# Patient Record
Sex: Male | Born: 1955 | Race: White | Hispanic: No | Marital: Married | Smoking: Never smoker
Health system: Southern US, Community
[De-identification: ages and names within clinical notes are randomized; demographics above are authoritative.]

## PROBLEM LIST (undated history)

## (undated) DIAGNOSIS — C159 Malignant neoplasm of esophagus, unspecified: Secondary | ICD-10-CM

## (undated) DIAGNOSIS — J189 Pneumonia, unspecified organism: Secondary | ICD-10-CM

## (undated) DIAGNOSIS — K219 Gastro-esophageal reflux disease without esophagitis: Secondary | ICD-10-CM

## (undated) DIAGNOSIS — T8859XA Other complications of anesthesia, initial encounter: Secondary | ICD-10-CM

## (undated) DIAGNOSIS — T4145XA Adverse effect of unspecified anesthetic, initial encounter: Secondary | ICD-10-CM

## (undated) DIAGNOSIS — M549 Dorsalgia, unspecified: Secondary | ICD-10-CM

## (undated) DIAGNOSIS — G8929 Other chronic pain: Secondary | ICD-10-CM

## (undated) HISTORY — PX: EYE SURGERY: SHX253

## (undated) HISTORY — PX: APPENDECTOMY: SHX54

## (undated) HISTORY — PX: HAND SURGERY: SHX662

## (undated) HISTORY — DX: Malignant neoplasm of esophagus, unspecified: C15.9

## (undated) HISTORY — PX: KNEE ARTHROSCOPY: SUR90

## (undated) HISTORY — PX: CERVICAL FUSION: SHX112

---

## 2000-06-20 ENCOUNTER — Other Ambulatory Visit: Admission: RE | Admit: 2000-06-20 | Discharge: 2000-06-20 | Payer: Self-pay | Admitting: Orthopedic Surgery

## 2003-04-21 ENCOUNTER — Ambulatory Visit (HOSPITAL_COMMUNITY): Admission: RE | Admit: 2003-04-21 | Discharge: 2003-04-22 | Payer: Self-pay | Admitting: Orthopaedic Surgery

## 2005-02-14 ENCOUNTER — Emergency Department (HOSPITAL_COMMUNITY): Admission: EM | Admit: 2005-02-14 | Discharge: 2005-02-14 | Payer: Self-pay | Admitting: Emergency Medicine

## 2006-02-26 ENCOUNTER — Encounter: Admission: RE | Admit: 2006-02-26 | Discharge: 2006-02-26 | Payer: Self-pay | Admitting: Family Medicine

## 2014-03-29 DIAGNOSIS — C159 Malignant neoplasm of esophagus, unspecified: Secondary | ICD-10-CM

## 2014-03-29 HISTORY — DX: Malignant neoplasm of esophagus, unspecified: C15.9

## 2014-03-30 ENCOUNTER — Telehealth: Payer: Self-pay | Admitting: *Deleted

## 2014-03-30 NOTE — Telephone Encounter (Signed)
Received referral from Dr. Liliane Channel office: obstructive esophageal cancer. Called pt, he reports he is tolerating supplements. Having some "regurgitation but most of it stays down." Instructed him to call office sooner if he is unable to keep liquids down. He voiced understanding.

## 2014-03-31 ENCOUNTER — Telehealth: Payer: Self-pay | Admitting: Oncology

## 2014-03-31 NOTE — Telephone Encounter (Signed)
LEFT MESSAGE FOR PATIENT TO RETURN CALL TO SCHEDULE NP APPT.  °

## 2014-03-31 NOTE — Telephone Encounter (Signed)
PATIENT CALLED TO CONFIRM NP APPT ON 12/07 W/DR.SHERRILL

## 2014-04-01 ENCOUNTER — Encounter: Payer: Self-pay | Admitting: Radiation Oncology

## 2014-04-01 NOTE — Progress Notes (Signed)
GI Location of Tumor / Histology: Obstructive esophageal cancer   Billy Romero presented months ago with symptoms [id number] solid dysphasia since June 2015-  Biopsies of  Esophageal brushings revealed: Endoscopy 03/29/14     Positive for malignancy,favor squamous cell carcinoma, Dr. Earlean Shawl , [place],MD 03/29/14 :  Past/Anticipated interventions by surgeon, if any:   Past/Anticipated interventions by medical oncology, if any: appt with Dr. Benay Spice 04/04/14; referral from Dr. Benay Spice to Dr. Servando Snare , scheduled Pet scan, chemotherapy teaching,    Weight changes, if any: 20 lb weight loss  Bowel/Bladder complaints, if any:   Nausea / Vomiting, if any: nausea/vomiting all liquids ,progresive solid/liquid dysphasia, chronic regurgitation of foam, odynophagia  Pain issues, if any:  Chronic back pain  SAFETY ISSUES:  Prior radiation? No  Pacemaker/ICD? NO  Is the patient on methotrexate? NO  Current Complaints / other details:   Married, Hx Ulcerative colitis, sigmoidoscopy 1979, ,peptic ulcer disease,s/p H. Pylori therapy, colon adenoma, [id number]; EGD 1984, , Cervical spine disc surgery,appendectomy Chews tobaco, quit alcohol 2009, hx alcohol abuse,   Father and paternal grandfather died of "leukemia" both age 51,, non smoker, hx alcohol abuse,quit 9 years ago,

## 2014-04-04 ENCOUNTER — Encounter: Payer: Self-pay | Admitting: Oncology

## 2014-04-04 ENCOUNTER — Telehealth: Payer: Self-pay | Admitting: Oncology

## 2014-04-04 ENCOUNTER — Ambulatory Visit: Payer: BC Managed Care – PPO

## 2014-04-04 ENCOUNTER — Ambulatory Visit: Payer: BC Managed Care – PPO | Admitting: Nutrition

## 2014-04-04 ENCOUNTER — Ambulatory Visit (HOSPITAL_BASED_OUTPATIENT_CLINIC_OR_DEPARTMENT_OTHER): Payer: BC Managed Care – PPO | Admitting: Oncology

## 2014-04-04 ENCOUNTER — Encounter (INDEPENDENT_AMBULATORY_CARE_PROVIDER_SITE_OTHER): Payer: Self-pay

## 2014-04-04 VITALS — BP 135/86 | HR 65 | Temp 98.1°F | Resp 19 | Ht 71.0 in | Wt 186.3 lb

## 2014-04-04 DIAGNOSIS — R634 Abnormal weight loss: Secondary | ICD-10-CM

## 2014-04-04 DIAGNOSIS — K5190 Ulcerative colitis, unspecified, without complications: Secondary | ICD-10-CM

## 2014-04-04 DIAGNOSIS — C159 Malignant neoplasm of esophagus, unspecified: Secondary | ICD-10-CM

## 2014-04-04 DIAGNOSIS — R1310 Dysphagia, unspecified: Secondary | ICD-10-CM

## 2014-04-04 NOTE — Progress Notes (Signed)
[facility] New Patient Consult   Referring MD: Earlie Raveling  Billy Romero 59 y.o.  04/20/1956    Reason for Referral: Esophagus cancer   HPI: Billy Romero reports a history of progressive solid dysphagia beginning in June 2015. He now has dysphagia with solids and regurgitates at times. He has occasional liquid dysphagia. He has been using nutrition supplements. He saw Dr. Earlean Shawl and was taken to an upper endoscopy 03/29/2014. A malignant-appearing mass was seen at 28 cm, totally obstructing the esophagus at 30 cm. The endoscopic could not pass this area. Brush cytology and multiple biopsies were obtained. The cytology at [facility] from esophageal brushings revealed a malignancy, favoring squamous cell carcinoma.    Past Medical History  Diagnosis Date  . Esophageal cancer 03/29/14    .  Ulcerative colitis   .  History of peptic ulcer disease, status post H. pylori therapy   .  History of a colon adenoma  Past surgical history: 1. Cervical spine disc surgery 2. Appendectomy in 8 grade Medications: Reviewed  Allergies: Not on File  Family history: His father and paternal grandfather died of "leukemia", both at age 51. No other family history of cancer. He has one brother, 1 sister, one half sister, and 2 half-brothers  Social History:   He lives in [place]. He is retired from a cells position with [employer] He does not use cigarettes. He is an alcoholic and has not used alcohol in 9 years. No transfusion history. No risk factor for HIV or hepatitis. He exercises by running. He plays golf.    ROS:   Positives include: Dysphagia, intermittent odynophagia, 20 pound weight loss, frequent regurgitation of "foam ", chronic low back pain  A complete ROS was otherwise negative.  Physical Exam:  Blood pressure 135/86, pulse 65, temperature 98.1 F (36.7 C), resp. rate 19, height 5' 11"  (1.803 m), weight 186 lb 4.8 oz (84.505 kg), SpO2 100  %.  HEENT: Oropharynx without visible mass, neck without mass Lungs: Clear bilaterally Cardiac: Regular rate and rhythm  Abdomen: No hepatosplenomegaly, nontender, no mass GU: Testes without mass  Vascular: No leg edema Lymph nodes: No cervical, supra-clavicular, axillary, or inguinal nodes Neurologic: Alert and oriented, the motor exam appears intact in the upper and lower extremities Skin: Hyperpigmentation in a suntan distribution Musculoskeletal: No spine tenderness     Assessment/Plan:   1. Esophagus cancer, squamous cell carcinoma, obstructing mass noted at 30 cm from the incisors on endoscopy 12/01/201   2. Solid/liquid dysphagia secondary to #1  3.   Weight loss  4.   History of ulcerative colitis  Disposition:   Billy Romero has been diagnosed with squamous cell carcinoma of the esophagus. We will follow-up on the surgical pathology from the esophagus biopsy. I discussed the standard treatment algorithm for esophagus cancer with Billy Romero and his wife. There is no clinical evidence of distant disease. He will undergo a staging PET scan. If he has localized disease I recommend neoadjuvant chemotherapy and radiation. He is scheduled to see Dr. Lisbeth Renshaw on 04/06/2014. We will make a referral to Dr. Servando Snare. I suspect he will be not be able to undergo an endoscopic ultrasound evaluation secondary to the obstructing mass.  Billy Romero met with the [facility] nutritionist today. He is tolerating liquids and nutrition supplements at present. We will consider placement of a feeding tube if he cannot tolerate liquids.  I recommend weekly Taxol/carboplatin and concurrent radiation. I reviewed the potential  toxicities associated with the Taxol/carboplatin chemotherapy regimen including the chance for nausea/vomiting, mucositis, diarrhea, alopecia, an allergic reaction, and hematologic toxicity. We discussed the neuropathy and bone pain associated with Taxol. He agrees to  proceed.  Billy Romero will be scheduled for a staging PET scan, baseline labs, and a chemotherapy teaching class this week. I discussed the case with Dr. Lisbeth Renshaw. The plan is to begin concurrent chemotherapy and radiation during the week of 04/11/2014.  Approximate 50 minutes were spent with the patient today. The majority of the time was used for counseling and coordination of care.  [place], [place] 04/04/2014, 4:01 PM

## 2014-04-04 NOTE — Progress Notes (Signed)
58 year old male diagnosed with obstructive esophageal cancer.  He is a patient of Dr. Benay Spice.  Past medical history includes remote alcoholism, chronic ulcerative colitis, dysphasia, and weight loss.  Medications include Prilosec.  Labs not available.  Height: 5 feet 11 inches. Weight: 186.3 pounds. Usual body weight: 210 pounds. BMI: 26.  Patient reports he ate a [place] dinner last week without difficulty.   Since he has been diagnosed with esophageal cancer, patient has been told to follow a liquid or very soft solid diet.   He now reports intermittent tolerance of both liquids and solids depending on the day. Patient has been trying to consume one to 2 Ensure Plus a day. Patient endorses weight loss.    Nutrition diagnosis: Unintended weight loss related to obstructive esophageal cancer as evidenced by 11% weight loss from usual body weight.  Intervention:  Patient educated to increase oral nutrition supplements to 4 times a day between meals. Provided samples. Recommended patient blenderize or puree solids and add liquids as needed to change consistency of foods. Provided multiple recipes along with fact sheets on increasing calories and protein. Questions were answered and teach back method used. Contact information was given.  Monitoring, evaluation, goals: Patient will tolerate increased oral intake to minimize further weight loss.  Next visit: To be scheduled.  **Disclaimer: This note was dictated with voice recognition software. Similar sounding words can inadvertently be transcribed and this note may contain transcription errors which may not have been corrected upon publication of note.**

## 2014-04-04 NOTE — Progress Notes (Signed)
Checked in new pt with no financial concerns. °

## 2014-04-04 NOTE — Telephone Encounter (Signed)
Pt confirmed labs/ov/chemo edu spk w/MD's nurse confirming updated D/T per MD, gave pt AVS.... KJ, sent msg to add chemo.... Pt wanted to wait to schedule referral till the first of the year, will call 12/08 to schedule referral for surgery......Marland Kitchen

## 2014-04-05 ENCOUNTER — Ambulatory Visit (HOSPITAL_BASED_OUTPATIENT_CLINIC_OR_DEPARTMENT_OTHER): Payer: BC Managed Care – PPO | Admitting: Nurse Practitioner

## 2014-04-05 ENCOUNTER — Telehealth: Payer: Self-pay | Admitting: Oncology

## 2014-04-05 ENCOUNTER — Encounter (HOSPITAL_COMMUNITY): Payer: Self-pay | Admitting: Internal Medicine

## 2014-04-05 ENCOUNTER — Inpatient Hospital Stay (HOSPITAL_COMMUNITY)
Admission: AD | Admit: 2014-04-05 | Discharge: 2014-04-21 | DRG: 374 | Disposition: A | Discharge status: Home Health | Payer: BC Managed Care – PPO | Source: Ambulatory Visit | Attending: Internal Medicine | Admitting: Internal Medicine

## 2014-04-05 ENCOUNTER — Encounter: Payer: Self-pay | Admitting: Internal Medicine

## 2014-04-05 ENCOUNTER — Other Ambulatory Visit: Payer: Self-pay | Admitting: *Deleted

## 2014-04-05 ENCOUNTER — Telehealth: Payer: Self-pay | Admitting: *Deleted

## 2014-04-05 VITALS — BP 129/84 | HR 65 | Temp 97.6°F | Resp 18 | Ht 71.0 in | Wt 182.3 lb

## 2014-04-05 DIAGNOSIS — F329 Major depressive disorder, single episode, unspecified: Secondary | ICD-10-CM | POA: Diagnosis present

## 2014-04-05 DIAGNOSIS — E440 Moderate protein-calorie malnutrition: Secondary | ICD-10-CM | POA: Insufficient documentation

## 2014-04-05 DIAGNOSIS — Z806 Family history of leukemia: Secondary | ICD-10-CM | POA: Diagnosis not present

## 2014-04-05 DIAGNOSIS — G8929 Other chronic pain: Secondary | ICD-10-CM | POA: Diagnosis present

## 2014-04-05 DIAGNOSIS — K222 Esophageal obstruction: Secondary | ICD-10-CM | POA: Diagnosis present

## 2014-04-05 DIAGNOSIS — R000 Tachycardia, unspecified: Secondary | ICD-10-CM

## 2014-04-05 DIAGNOSIS — R609 Edema, unspecified: Secondary | ICD-10-CM | POA: Diagnosis not present

## 2014-04-05 DIAGNOSIS — M549 Dorsalgia, unspecified: Secondary | ICD-10-CM | POA: Diagnosis present

## 2014-04-05 DIAGNOSIS — F419 Anxiety disorder, unspecified: Secondary | ICD-10-CM | POA: Diagnosis present

## 2014-04-05 DIAGNOSIS — M542 Cervicalgia: Secondary | ICD-10-CM | POA: Diagnosis present

## 2014-04-05 DIAGNOSIS — K9189 Other postprocedural complications and disorders of digestive system: Secondary | ICD-10-CM | POA: Diagnosis not present

## 2014-04-05 DIAGNOSIS — R0902 Hypoxemia: Secondary | ICD-10-CM | POA: Diagnosis not present

## 2014-04-05 DIAGNOSIS — E43 Unspecified severe protein-calorie malnutrition: Secondary | ICD-10-CM | POA: Diagnosis present

## 2014-04-05 DIAGNOSIS — R1314 Dysphagia, pharyngoesophageal phase: Secondary | ICD-10-CM | POA: Diagnosis present

## 2014-04-05 DIAGNOSIS — K5669 Other intestinal obstruction: Secondary | ICD-10-CM | POA: Diagnosis not present

## 2014-04-05 DIAGNOSIS — R1110 Vomiting, unspecified: Secondary | ICD-10-CM

## 2014-04-05 DIAGNOSIS — E876 Hypokalemia: Secondary | ICD-10-CM | POA: Diagnosis not present

## 2014-04-05 DIAGNOSIS — Z23 Encounter for immunization: Secondary | ICD-10-CM | POA: Diagnosis not present

## 2014-04-05 DIAGNOSIS — J690 Pneumonitis due to inhalation of food and vomit: Secondary | ICD-10-CM | POA: Diagnosis not present

## 2014-04-05 DIAGNOSIS — K5190 Ulcerative colitis, unspecified, without complications: Secondary | ICD-10-CM

## 2014-04-05 DIAGNOSIS — K913 Postprocedural intestinal obstruction: Secondary | ICD-10-CM | POA: Diagnosis not present

## 2014-04-05 DIAGNOSIS — R1319 Other dysphagia: Secondary | ICD-10-CM

## 2014-04-05 DIAGNOSIS — E860 Dehydration: Secondary | ICD-10-CM | POA: Diagnosis present

## 2014-04-05 DIAGNOSIS — C159 Malignant neoplasm of esophagus, unspecified: Secondary | ICD-10-CM | POA: Diagnosis present

## 2014-04-05 DIAGNOSIS — K315 Obstruction of duodenum: Secondary | ICD-10-CM

## 2014-04-05 DIAGNOSIS — R634 Abnormal weight loss: Secondary | ICD-10-CM

## 2014-04-05 DIAGNOSIS — R0602 Shortness of breath: Secondary | ICD-10-CM

## 2014-04-05 DIAGNOSIS — Z8249 Family history of ischemic heart disease and other diseases of the circulatory system: Secondary | ICD-10-CM | POA: Diagnosis not present

## 2014-04-05 DIAGNOSIS — R1310 Dysphagia, unspecified: Secondary | ICD-10-CM

## 2014-04-05 DIAGNOSIS — K567 Ileus, unspecified: Secondary | ICD-10-CM | POA: Diagnosis not present

## 2014-04-05 DIAGNOSIS — K56609 Unspecified intestinal obstruction, unspecified as to partial versus complete obstruction: Secondary | ICD-10-CM

## 2014-04-05 DIAGNOSIS — R112 Nausea with vomiting, unspecified: Secondary | ICD-10-CM | POA: Insufficient documentation

## 2014-04-05 LAB — CBC WITH DIFFERENTIAL/PLATELET
BASOS ABS: 0 10*3/uL (ref 0.0–0.1)
Basophils Relative: 0 % (ref 0–1)
EOS ABS: 0 10*3/uL (ref 0.0–0.7)
Eosinophils Relative: 0 % (ref 0–5)
HCT: 42.5 % (ref 39.0–52.0)
HEMOGLOBIN: 14.5 g/dL (ref 13.0–17.0)
Lymphocytes Relative: 15 % (ref 12–46)
Lymphs Abs: 1.5 10*3/uL (ref 0.7–4.0)
MCH: 29.3 pg (ref 26.0–34.0)
MCHC: 34.1 g/dL (ref 30.0–36.0)
MCV: 85.9 fL (ref 78.0–100.0)
MONOS PCT: 5 % (ref 3–12)
Monocytes Absolute: 0.5 10*3/uL (ref 0.1–1.0)
NEUTROS ABS: 7.7 10*3/uL (ref 1.7–7.7)
NEUTROS PCT: 80 % — AB (ref 43–77)
PLATELETS: 185 10*3/uL (ref 150–400)
RBC: 4.95 MIL/uL (ref 4.22–5.81)
RDW: 12.5 % (ref 11.5–15.5)
WBC: 9.8 10*3/uL (ref 4.0–10.5)

## 2014-04-05 LAB — COMPREHENSIVE METABOLIC PANEL
ALBUMIN: 4.3 g/dL (ref 3.5–5.2)
ALT: 13 U/L (ref 0–53)
ANION GAP: 12 (ref 5–15)
AST: 17 U/L (ref 0–37)
Alkaline Phosphatase: 96 U/L (ref 39–117)
BUN: 19 mg/dL (ref 6–23)
CALCIUM: 10.3 mg/dL (ref 8.4–10.5)
CO2: 29 mEq/L (ref 19–32)
CREATININE: 1.1 mg/dL (ref 0.50–1.35)
Chloride: 104 mEq/L (ref 96–112)
GFR calc Af Amer: 82 mL/min — ABNORMAL LOW (ref >90)
GFR calc non Af Amer: 71 mL/min — ABNORMAL LOW (ref >90)
Glucose, Bld: 99 mg/dL (ref 70–99)
Potassium: 5.1 mEq/L (ref 3.7–5.3)
Sodium: 145 mEq/L (ref 137–147)
Total Bilirubin: 1.1 mg/dL (ref 0.3–1.2)
Total Protein: 7.5 g/dL (ref 6.0–8.3)

## 2014-04-05 LAB — TSH: TSH: 0.523 u[IU]/mL (ref 0.350–4.500)

## 2014-04-05 LAB — MAGNESIUM: Magnesium: 2.2 mg/dL (ref 1.5–2.5)

## 2014-04-05 LAB — PHOSPHORUS: PHOSPHORUS: 3.4 mg/dL (ref 2.3–4.6)

## 2014-04-05 MED ORDER — ACETAMINOPHEN 650 MG RE SUPP
650.0000 mg | Freq: Four times a day (QID) | RECTAL | Status: DC | PRN
  Filled 2014-04-05: qty 1

## 2014-04-05 MED ORDER — MORPHINE SULFATE 2 MG/ML IJ SOLN
1.0000 mg | INTRAMUSCULAR | Status: DC | PRN
  Administered 2014-04-05 – 2014-04-06 (×3): 1 mg via INTRAVENOUS
  Filled 2014-04-05 (×3): qty 1

## 2014-04-05 MED ORDER — PANTOPRAZOLE SODIUM 40 MG IV SOLR
40.0000 mg | INTRAVENOUS | Status: DC
Start: 2014-04-05 — End: 2014-04-10
  Administered 2014-04-05 – 2014-04-09 (×5): 40 mg via INTRAVENOUS
  Filled 2014-04-05 (×6): qty 40

## 2014-04-05 MED ORDER — HEPARIN SODIUM (PORCINE) 5000 UNIT/ML IJ SOLN
5000.0000 [IU] | Freq: Three times a day (TID) | INTRAMUSCULAR | Status: DC
  Administered 2014-04-05: 5000 [IU] via SUBCUTANEOUS
  Filled 2014-04-05 (×5): qty 1

## 2014-04-05 MED ORDER — ONDANSETRON HCL 4 MG PO TABS: 4.0000 mg | ORAL_TABLET | Freq: Four times a day (QID) | ORAL | Status: DC | PRN

## 2014-04-05 MED ORDER — ACETAMINOPHEN 325 MG PO TABS: 650.0000 mg | ORAL_TABLET | Freq: Four times a day (QID) | ORAL | Status: DC | PRN

## 2014-04-05 MED ORDER — DEXTROSE 5 % IV SOLN
500.0000 mg | Freq: Three times a day (TID) | INTRAVENOUS | Status: DC | PRN
Start: 2014-04-05 — End: 2014-04-21
  Filled 2014-04-05: qty 5

## 2014-04-05 MED ORDER — INFLUENZA VAC SPLIT QUAD 0.5 ML IM SUSY
0.5000 mL | PREFILLED_SYRINGE | INTRAMUSCULAR | Status: DC
  Filled 2014-04-05 (×2): qty 0.5

## 2014-04-05 MED ORDER — ONDANSETRON HCL 4 MG/2ML IJ SOLN
4.0000 mg | Freq: Four times a day (QID) | INTRAMUSCULAR | Status: DC | PRN
  Administered 2014-04-08: 4 mg via INTRAVENOUS
  Filled 2014-04-05: qty 2

## 2014-04-05 MED ORDER — KCL IN DEXTROSE-NACL 20-5-0.45 MEQ/L-%-% IV SOLN
INTRAVENOUS | Status: DC
  Administered 2014-04-05 – 2014-04-06 (×3): via INTRAVENOUS
  Filled 2014-04-05 (×3): qty 1000

## 2014-04-05 NOTE — Consult Note (Signed)
Reason for Consult:Esophageal caner with obstruction Referring Physician: Dr. Barton Dubois  Billy Romero is an 58 y.o. male.  HPI: Pt was admitted with solid dysphagia since June 2015. He has undergone EGD showing a malignat appearing mass at 28 cm, and complete obstruction at 30 cm.   Path is reported to be squamous cell carcinoma.  He now presents with dysphagia, weight loss and dehydration.  He was seen yesterday by Dr. Benay Spice, and he plans a chemo and radiation therapy, followed by surgical consult with Dr. Servando Snare to follow. He says he was able to force some stuff down up until yesterday after seeing Dr. Benay Spice.  Since then he cannot even get his own sputum down.   We are ask to see and consider J tube placement for nutrition and hydration.  Past Medical History  Diagnosis Date  Esophageal cancer   Hx of heavy ETOH use  20 years, sober x 9 years.   Chronic neck and back pain        History reviewed. No pertinent past surgical history.  History reviewed. No pertinent family history.  Social History:  reports that he has never smoked. He does not have any smokeless tobacco history on file. He reports that he does not drink alcohol. His drug history is not on file.  Allergies: No Known Allergies  Medications:  Prior to Admission:  Prescriptions prior to admission  Medication Sig Dispense Refill Last Dose  baclofen (LIORESAL) 10 MG tablet Take 10 mg by mouth 3 (three) times daily as needed for muscle spasms.   2 Past Week at Unknown time  HYDROcodone-acetaminophen (NORCO/VICODIN) 5-325 MG per tablet Take 1 tablet by mouth every 6 (six) hours as needed. for pain  0 Past Week at Unknown time  meloxicam (MOBIC) 15 MG tablet Take 15 mg by mouth daily as needed for pain.   0 Past Month at Unknown time  PRESCRIPTION MEDICATION Chem [facility]      Scheduled: . heparin  5,000 Units Subcutaneous 3 times per day  . pantoprazole (PROTONIX) IV  40 mg Intravenous Q24H   Continuous: .  dextrose 5 % and 0.45 % NaCl with KCl 20 mEq/L 75 mL/hr at 04/05/14 1432   ZYS:[id number] **OR** acetaminophen, methocarbamol (ROBAXIN)  IV, morphine injection, ondansetron **OR** ondansetron (ZOFRAN) IV Anti-infectives    None      Results for orders placed or performed during the hospital encounter of 04/05/14 (from the past 48 hour(s))  Comprehensive metabolic panel     Status: Abnormal   Collection Time: 04/05/14  1:48 PM  Result Value Ref Range   Sodium 145 137 - 147 mEq/L   Potassium 5.1 3.7 - 5.3 mEq/L   Chloride 104 96 - 112 mEq/L   CO2 29 19 - 32 mEq/L   Glucose, Bld 99 70 - 99 mg/dL   BUN 19 6 - 23 mg/dL   Creatinine, Ser 1.10 0.50 - 1.35 mg/dL   Calcium 10.3 8.4 - 10.5 mg/dL   Total Protein 7.5 6.0 - 8.3 g/dL   Albumin 4.3 3.5 - 5.2 g/dL   AST 17 0 - 37 U/L   ALT 13 0 - 53 U/L   Alkaline Phosphatase 96 39 - 117 U/L   Total Bilirubin 1.1 0.3 - 1.2 mg/dL   GFR calc non Af Amer 71 (L) >90 mL/min   GFR calc Af Amer 82 (L) >90 mL/min    Comment: (NOTE) The eGFR has been calculated using the CKD EPI equation. This calculation  has not been validated in all clinical situations. eGFR's persistently <90 mL/min signify possible Chronic Kidney Disease.    Anion gap 12 5 - 15  Magnesium     Status: None   Collection Time: 04/05/14  1:48 PM  Result Value Ref Range   Magnesium 2.2 1.5 - 2.5 mg/dL  Phosphorus     Status: None   Collection Time: 04/05/14  1:48 PM  Result Value Ref Range   Phosphorus 3.4 2.3 - 4.6 mg/dL  CBC WITH DIFFERENTIAL     Status: Abnormal   Collection Time: 04/05/14  1:48 PM  Result Value Ref Range   WBC 9.8 4.0 - 10.5 K/uL   RBC 4.95 4.22 - 5.81 MIL/uL   Hemoglobin 14.5 13.0 - 17.0 g/dL   HCT 42.5 39.0 - 52.0 %   MCV 85.9 78.0 - 100.0 fL   MCH 29.3 26.0 - 34.0 pg   MCHC 34.1 30.0 - 36.0 g/dL   RDW 12.5 11.5 - 15.5 %   Platelets 185 150 - 400 K/uL   Neutrophils Relative % 80 (H) 43 - 77 %   Neutro Abs 7.7 1.7 - 7.7 K/uL   Lymphocytes  Relative 15 12 - 46 %   Lymphs Abs 1.5 0.7 - 4.0 K/uL   Monocytes Relative 5 3 - 12 %   Monocytes Absolute 0.5 0.1 - 1.0 K/uL   Eosinophils Relative 0 0 - 5 %   Eosinophils Absolute 0.0 0.0 - 0.7 K/uL   Basophils Relative 0 0 - 1 %   Basophils Absolute 0.0 0.0 - 0.1 K/uL    No results found.  Review of Systems  Constitutional: Positive for weight loss (20 pound since june 2015).  HENT: Negative.        He cannot swallow even his sputum since yesterday.  Eyes: Negative.   Respiratory: Negative.   Cardiovascular: Negative.   Gastrointestinal: Positive for heartburn. Negative for nausea and vomiting.  Genitourinary: Negative.   Musculoskeletal: Negative.   Skin: Negative.   Neurological: Negative.   Endo/Heme/Allergies: Negative.   Psychiatric/Behavioral: Negative.    Blood pressure 121/74, pulse 62, temperature 98.1 F (36.7 C), temperature source Oral, resp. rate 16, height 5' 11" (1.803 m), weight 82.555 kg (182 lb), SpO2 100 %. Physical Exam  Constitutional: He is oriented to person, place, and time. He appears well-developed and well-nourished. No distress.  HENT:  Head: Normocephalic and atraumatic.  Nose: Nose normal.  Eyes: Conjunctivae and EOM are normal. Pupils are equal, round, and reactive to light. Right eye exhibits no discharge. Left eye exhibits no discharge.  Neck: Normal range of motion. Neck supple. No JVD present. No tracheal deviation present. No thyromegaly present.  Cardiovascular: Normal rate, regular rhythm, normal heart sounds and intact distal pulses.   No murmur heard. Respiratory: Effort normal and breath sounds normal. No respiratory distress. He has no wheezes. He has no rales. He exhibits no tenderness.  GI: Soft. Bowel sounds are normal. He exhibits no distension and no mass. There is no tenderness. There is no rebound and no guarding.  Musculoskeletal: He exhibits no edema or tenderness.  Lymphadenopathy:    He has no cervical adenopathy.   Neurological: He is alert and oriented to person, place, and time. No cranial nerve deficit.  Skin: Skin is warm and dry. No rash noted. He is not diaphoretic. No erythema. No pallor.  Psychiatric: He has a normal mood and affect. His behavior is normal. Judgment and thought content normal.  Assessment/Plan: 1.  Esophageal cancer with complete obstruction 2.  Dehydration  3.  Chronic back and neck pain 4.  Malnutrition   Plan:  We are ask to see for J tube placement for hydration and nutrition while he is undergoing chemo and radiation therapy.  Dr. Harlow Asa will see in AM, he is NPO and we will try to get him in the schedule tomorrow.  Milcah Dulany 04/05/2014, 3:51 PM

## 2014-04-05 NOTE — Progress Notes (Addendum)
  [facility] OFFICE PROGRESS NOTE   Diagnosis:  Esophagus cancer  INTERVAL HISTORY:   Mr. Grounds returns prior to scheduled followup. He was seen as a new patient by Dr. Benay Spice on 04/04/2014. He contacted the office this morning to report regurgitation of all liquids since yesterday morning. He denies nausea. He has mild pain at the left chest. No fever. Last bowel movement was "a few days ago". Stable chronic back pain. He is losing weight.  Objective:  Vital signs in last 24 hours:  Blood pressure 129/84, pulse 65, temperature 97.6 F (36.4 C), temperature source Oral, resp. rate 18, height 5\' 11"  (1.803 m), weight 182 lb 4.8 oz (82.691 kg).    HEENT: mucous membranes are moist. Resp: lungs clear bilaterally. Cardio: regular rate and rhythm. GI: abdomen soft and nontender. No hepatomegaly. Vascular: no leg edema.  Skin: mild decrease in skin turgor.    Lab Results:  No results found for: WBC, HGB, HCT, MCV, PLT, NEUTROABS  Imaging:  No results found.  Medications: I have reviewed the patient's current medications.  Assessment/Plan: 1. Esophagus cancer, squamous cell carcinoma, obstructing mass noted at 30 cm from the incisors on endoscopy 03/29/2014. 2. Solid/liquid dysphagia secondary to #1 3. Weight loss 4. History of ulcerative colitis  Disposition: Mr. Gettel has progressive dysphagia. He is now unable to tolerate any liquids. Dr. Benay Spice has spoken with Dr. Doyle Askew. Mr. Tabar will be admitted to [facility] on the hospitalist service. A consult will be obtained with general surgery for placement of a J-tube. We will make adjustments to his outpatient followup schedule as needed.  Patient seen with Dr. Benay Spice.    Ned Card ANP/GNP-BC   04/05/2014  10:41 AM  This was a shared visit with Ned Card. Mr. Brumley has developed total solid and liquid dysphagia. He will be admitted. We will ask the surgeons to place a  jejunostomy feeding tube. I discussed the case with Dr. Servando Snare. He prefers a jejunostomy tube as opposed to a gastrostomy tube given the potential plan for an esophagectomy procedure in the future.  We will reschedule the planned neoadjuvant chemotherapy and radiation as needed pending the timing of a jejunostomy tube.   Julieanne Manson, M.D.

## 2014-04-05 NOTE — Telephone Encounter (Signed)
Pt confirmed MD visit per 12/08 POF, gave pt AVS.... KJ

## 2014-04-05 NOTE — H&P (Signed)
[facility] History and Physical  KWELI GRASSEL EXH:[id number] DOB: May 15, 1955 DOA: 04/05/2014  Referring physician: Dr. Benay Spice  PCP: Lujean Amel, MD   Chief Complaint: dysphagia, weight loss and dehdyration  HPI: Billy Romero is a 58 y.o. 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 Admitted from [facility] due to inability to eat or drink and dehydration. Patient reports that symptoms has been steadily progressing and worsening over the last week or two and currently having trouble with keeping down even his own saliva. Plan is for patient to be admitted for IVF's resuscitation and also placement of J tube for tube feedings and nutrition.  Patient denies CP, cough, fever, chills, hematemesis, melena, HA's, blurred vision or any other complaints.   Review of Systems:  Negative except as otherwise mentioned on HPI.   Past Medical History  Diagnosis Date  . Esophageal cancer 03/29/14   History reviewed. No pertinent past surgical history.   Social History:  reports that he has never smoked. He does not have any smokeless tobacco history on file. He reports that he does not drink alcohol. His drug history is not on file.  No Known Allergies  Family hx: positive for HTN; otherwise no contributory   Prior to Admission medications   Medication Sig Start Date End Date Taking? Authorizing Provider  baclofen (LIORESAL) 10 MG tablet Take 10 mg by mouth 3 (three) times daily as needed for muscle spasms.  03/02/14  Yes Historical Provider, MD  HYDROcodone-acetaminophen (NORCO/VICODIN) 5-325 MG per tablet Take 1 tablet by mouth every 6 (six) hours as needed. for pain 03/02/14  Yes Historical Provider, MD  meloxicam (MOBIC) 15 MG tablet Take 15 mg by mouth daily as needed for pain.  03/20/14   Yes Historical Provider, MD  [place]    Historical Provider, MD   Physical Exam: There were no vitals filed for this visit.  Wt Readings from Last 3 Encounters:  04/05/14 82.691 kg (182 lb 4.8 oz)  04/04/14 84.505 kg (186 lb 4.8 oz)    General:  Appears calm and comfortable, no fever and complaining of Nausea. Patient endorses problem swallowing and keeping down even his own saliva. Eyes: PERRL, normal lids, irises & conjunctiva, no icterus, no nystagmus ENT: grossly normal hearing, no erythema, no exudates, no drainage out of ears and nostrils. Dry MM. Neck: no LAD, masses or thyromegaly, no JVD Cardiovascular: RRR, no m/r/g. No LE edema. Respiratory: CTA bilaterally, no w/r/r. Normal respiratory effort. Abdomen: soft, ntnd Skin: no rash or induration seen on limited exam Musculoskeletal: grossly normal tone BUE/BLE Psychiatric: grossly normal mood and affect, speech fluent and appropriate Neurologic: grossly non-focal.          Labs on Admission:  Basic Metabolic Panel: No results for input(s): NA, K, CL, CO2, GLUCOSE, BUN, CREATININE, CALCIUM, MG, PHOS in the last 168 hours. Liver Function Tests: No results for input(s): AST, ALT, ALKPHOS, BILITOT, PROT, ALBUMIN in the last 168 hours. No results for input(s): LIPASE, AMYLASE in the last 168 hours. No results for input(s): AMMONIA in the last 168 hours. CBC: No results for input(s): WBC, NEUTROABS, HGB, HCT, MCV, PLT in the last 168 hours. Cardiac Enzymes: No results for input(s): CKTOTAL, CKMB, CKMBINDEX, TROPONINI in the last 168 hours.  BNP (last 3 results) No results for input(s):  PROBNP in the last 8760 hours. CBG: No results for input(s): GLUCAP in the last 168 hours.  Radiological Exams on Admission: No results found.  EKG:  None   Assessment/Plan 1-Dysphagia, pharyngoesophageal phase: secondary to complete obstruction of esophagus due to cancer. -will start IVF's  resuscitation -surgery consulted for J tube placement -electrolytes repletion as needed  2-Esophagus cancer: per oncology -will follow recommendations -plan is for radiation to be started next week  3-Chronic back pain: will use PRN pain medications and muscle relaxants   4-Protein-calorie malnutrition, severe: due to decrease PO intake and cancer. -dietician has been consulted -once TF in place will start nutrition    General Surgery (Dr. Harlow Asa)  Code Status: Full DVT Prophylaxis:heparin  Family Communication: wife ta bedside Disposition Plan: inpatient, LOS > 2 midnights, med-surg bed  Time spent: 26 minutes  Barton Dubois [facility] Pager [phone]

## 2014-04-05 NOTE — Progress Notes (Unsigned)
Patient ID: Billy Romero, male   DOB: [birth year], 58 y.o.   MRN: [id number] Transfer from Dr. Gearldine Shown office, pt with progressive solid dysphagia since June 2015, now with liquid dysphagia. Saw Dr. Earlean Shawl and was taken to an upper endoscopy 03/29/2014. A malignant-appearing mass was seen at 28 cm, totally obstructing the esophagus at 30 cm. The endoscopic could not pass this area. Brush cytology and multiple biopsies were obtained. The cytology at [facility] from esophageal brushings revealed a malignancy, favoring squamous cell carcinoma. Pt will need surgery consult for feeding tube placement. Medical floor requested.   Faye Ramsay, MD  [facility] Pager [phone]  If 7PM-7AM, please contact night-coverage [email or web address] Password [email or web address]

## 2014-04-05 NOTE — Progress Notes (Signed)
INITIAL NUTRITION ASSESSMENT  DOCUMENTATION CODES Per approved criteria  -Non-severe (moderate) malnutrition in the context of chronic illness  Pt meets criteria for moderate MALNUTRITION in the context of chronic illness as evidenced by 13% wt loss x 4 months and energy intake <75% for > 7 days.  INTERVENTION: -Recommend Osmolite 1.5 @ 15 ml/hr via J tube and increase by 10 ml every 4 hours to goal rate of 55 ml/hr.  -30 ml Prostat BID.   -Tube feeding regimen provides 2180 kcal (100% of needs), 113 grams of protein, and 1005 ml of H2O.  -If IVF discontinued, will require additional 995 ml free water via flushes (250 ml QID) -D/t jejunal tube placement, pt would not benefit from bolus feeding schedule, will require continuous tube feeding -RD to continue to monitor  NUTRITION DIAGNOSIS: Inadequate oral intake related to esophageal cancer as evidenced by need for nutrition support.   Goal: Pt to meet >/= 90% of their estimated nutrition needs   Monitor:  TF regimen & tolerance, weight, labs, I/O's  Reason for Assessment: Consult for TF recommendations and severe malnutrition  Admitting Dx: Dysphagia, pharyngoesophageal phase  ASSESSMENT: 58 y.o. Admitted with progressive dysphagia. He is now unable to tolerate any liquids. PMHX of Esophagus cancer, squamous cell carcinoma, Weight loss, and ulcerative colitis.  Pt scheduled to have J-tube placed tomorrow. RD to provide TF recommendations.   Pt reports not being able to drink any liquids, states that all liquids come back up. This has been occuring for the past day. Prior to this, pt was able to drink liquids and consume pureed foods with intermittent tolerance some days.  Pt states that he has lost 15-20 lb x 3-4 months. Pt with 28 lb weight loss, 13% weight loss x 3-4 months, significant for time frame.   Pt followed by [place] RD, last seen 12/7. Pt is scheduled to begin radiation treatments next week.  RD to follow-up  with patient for more education regarding TF regimen when wife is present. Will discuss home feeding regimen before discharge.  Nutrition focused physical exam shows no sign of depletion of muscle mass or body fat.  Labs reviewed: WNL  Height: Ht Readings from Last 1 Encounters:  04/05/14 5\' 11"  (1.803 m)    Weight: Wt Readings from Last 1 Encounters:  04/05/14 182 lb (82.555 kg)    Ideal Body Weight: 172 lb  % Ideal Body Weight: 106%  Wt Readings from Last 10 Encounters:  04/05/14 182 lb (82.555 kg)  04/05/14 182 lb 4.8 oz (82.691 kg)  04/04/14 186 lb 4.8 oz (84.505 kg)    Usual Body Weight: 210 lb  % Usual Body Weight: 87%  BMI:  Body mass index is 25.4 kg/(m^2).  Estimated Nutritional Needs: Kcal: 2000-2200 Protein: 105-115g Fluid: 2L/day  Skin: intact  Diet Order: Diet NPO time specified Except for: Ice Chips  EDUCATION NEEDS: -Education not appropriate at this time  No intake or output data in the 24 hours ending 04/05/14 1525  Last BM: PTA  Labs:   Recent Labs Lab 04/05/14 1348  NA 145  K 5.1  CL 104  CO2 29  BUN 19  CREATININE 1.10  CALCIUM 10.3  MG 2.2  PHOS 3.4  GLUCOSE 99    CBG (last 3)  No results for input(s): GLUCAP in the last 72 hours.  Scheduled Meds: . heparin  5,000 Units Subcutaneous 3 times per day  . pantoprazole (PROTONIX) IV  40 mg Intravenous Q24H  Continuous Infusions: . dextrose 5 % and 0.45 % NaCl with KCl 20 mEq/L 75 mL/hr at 04/05/14 1432    Past Medical History  Diagnosis Date  . Esophageal cancer 03/29/14    History reviewed. No pertinent past surgical history.  Clayton Bibles, MS, RD, LDN Pager: [phone] After Hours Pager: [phone]

## 2014-04-05 NOTE — Telephone Encounter (Signed)
Per staff message and POF I have scheduled appts. Advised scheduler of appts. JMW  

## 2014-04-06 ENCOUNTER — Encounter (HOSPITAL_COMMUNITY)
Admission: AD | Disposition: A | Discharge status: Home Health | Payer: Self-pay | Source: Ambulatory Visit | Attending: Internal Medicine

## 2014-04-06 ENCOUNTER — Ambulatory Visit
Admission: RE | Admit: 2014-04-06 | Discharge: 2014-04-06 | Disposition: A | Discharge status: Home / Self Care | Payer: 59 | Source: Ambulatory Visit | Attending: Radiation Oncology | Admitting: Radiation Oncology

## 2014-04-06 ENCOUNTER — Ambulatory Visit (INDEPENDENT_AMBULATORY_CARE_PROVIDER_SITE_OTHER): Payer: Self-pay | Admitting: Surgery

## 2014-04-06 ENCOUNTER — Ambulatory Visit: Payer: 59

## 2014-04-06 ENCOUNTER — Inpatient Hospital Stay (HOSPITAL_COMMUNITY): Payer: BC Managed Care – PPO | Admitting: Anesthesiology

## 2014-04-06 DIAGNOSIS — C159 Malignant neoplasm of esophagus, unspecified: Secondary | ICD-10-CM | POA: Diagnosis present

## 2014-04-06 DIAGNOSIS — R1314 Dysphagia, pharyngoesophageal phase: Secondary | ICD-10-CM

## 2014-04-06 HISTORY — DX: Malignant neoplasm of esophagus, unspecified: C15.9

## 2014-04-06 HISTORY — PX: PEG PLACEMENT: SHX5437

## 2014-04-06 LAB — SURGICAL PCR SCREEN
MRSA, PCR: NEGATIVE
STAPHYLOCOCCUS AUREUS: NEGATIVE

## 2014-04-06 SURGERY — INSERTION, PEG TUBE
Anesthesia: General | Site: Abdomen

## 2014-04-06 MED ORDER — HYDROMORPHONE HCL 2 MG/ML IJ SOLN
INTRAMUSCULAR | Status: AC
  Filled 2014-04-06: qty 1

## 2014-04-06 MED ORDER — ONDANSETRON HCL 4 MG/2ML IJ SOLN
INTRAMUSCULAR | Status: AC
  Filled 2014-04-06: qty 2

## 2014-04-06 MED ORDER — ONDANSETRON HCL 4 MG/2ML IJ SOLN: 4.0000 mg | Freq: Four times a day (QID) | INTRAMUSCULAR | Status: DC | PRN

## 2014-04-06 MED ORDER — EPHEDRINE SULFATE 50 MG/ML IJ SOLN
INTRAMUSCULAR | Status: DC | PRN
  Administered 2014-04-06: 10 mg via INTRAVENOUS

## 2014-04-06 MED ORDER — EPHEDRINE SULFATE 50 MG/ML IJ SOLN
INTRAMUSCULAR | Status: AC
  Filled 2014-04-06: qty 1

## 2014-04-06 MED ORDER — DEXTROSE 5 % IV SOLN
INTRAVENOUS | Status: AC
  Filled 2014-04-06: qty 2

## 2014-04-06 MED ORDER — HYDROMORPHONE HCL 1 MG/ML IJ SOLN
INTRAMUSCULAR | Status: AC
  Administered 2014-04-06: 0.5 mg via INTRAVENOUS
  Filled 2014-04-06: qty 1

## 2014-04-06 MED ORDER — ROCURONIUM BROMIDE 100 MG/10ML IV SOLN
INTRAVENOUS | Status: DC | PRN
  Administered 2014-04-06: 30 mg via INTRAVENOUS

## 2014-04-06 MED ORDER — MIDAZOLAM HCL 5 MG/5ML IJ SOLN
INTRAMUSCULAR | Status: DC | PRN
  Administered 2014-04-06: 2 mg via INTRAVENOUS

## 2014-04-06 MED ORDER — SODIUM CHLORIDE 0.9 % IV SOLN
INTRAVENOUS | Status: DC
  Administered 2014-04-06 – 2014-04-09 (×5): via INTRAVENOUS

## 2014-04-06 MED ORDER — SODIUM CHLORIDE 0.9 % IJ SOLN
INTRAMUSCULAR | Status: AC
  Filled 2014-04-06: qty 10

## 2014-04-06 MED ORDER — INFLUENZA VAC SPLIT QUAD 0.5 ML IM SUSY
0.5000 mL | PREFILLED_SYRINGE | INTRAMUSCULAR | Status: AC
Start: 2014-04-07 — End: 2014-04-07
  Administered 2014-04-07: 0.5 mL via INTRAMUSCULAR
  Filled 2014-04-06 (×2): qty 0.5

## 2014-04-06 MED ORDER — FENTANYL CITRATE 0.05 MG/ML IJ SOLN
INTRAMUSCULAR | Status: AC
  Filled 2014-04-06: qty 5

## 2014-04-06 MED ORDER — LACTATED RINGERS IV SOLN
INTRAVENOUS | Status: DC
  Administered 2014-04-06: 1000 mL via INTRAVENOUS

## 2014-04-06 MED ORDER — FENTANYL CITRATE 0.05 MG/ML IJ SOLN
INTRAMUSCULAR | Status: DC | PRN
  Administered 2014-04-06: 50 ug via INTRAVENOUS
  Administered 2014-04-06: 100 ug via INTRAVENOUS
  Administered 2014-04-06 (×2): 50 ug via INTRAVENOUS

## 2014-04-06 MED ORDER — GLYCOPYRROLATE 0.2 MG/ML IJ SOLN
INTRAMUSCULAR | Status: AC
  Filled 2014-04-06: qty 3

## 2014-04-06 MED ORDER — GLYCOPYRROLATE 0.2 MG/ML IJ SOLN
INTRAMUSCULAR | Status: DC | PRN
  Administered 2014-04-06: .6 mg via INTRAVENOUS

## 2014-04-06 MED ORDER — LIDOCAINE HCL (CARDIAC) 20 MG/ML IV SOLN
INTRAVENOUS | Status: DC | PRN
  Administered 2014-04-06: 50 mg via INTRAVENOUS

## 2014-04-06 MED ORDER — KCL IN DEXTROSE-NACL 30-5-0.45 MEQ/L-%-% IV SOLN
INTRAVENOUS | Status: DC
  Filled 2014-04-06 (×5): qty 1000

## 2014-04-06 MED ORDER — MORPHINE SULFATE 2 MG/ML IJ SOLN
1.0000 mg | INTRAMUSCULAR | Status: DC | PRN
  Administered 2014-04-06 – 2014-04-07 (×3): 2 mg via INTRAVENOUS
  Filled 2014-04-06 (×3): qty 1

## 2014-04-06 MED ORDER — ENOXAPARIN SODIUM 40 MG/0.4ML ~~LOC~~ SOLN
40.0000 mg | SUBCUTANEOUS | Status: DC
  Administered 2014-04-08 – 2014-04-21 (×13): 40 mg via SUBCUTANEOUS
  Filled 2014-04-06 (×15): qty 0.4

## 2014-04-06 MED ORDER — DEXAMETHASONE SODIUM PHOSPHATE 10 MG/ML IJ SOLN
INTRAMUSCULAR | Status: AC
  Filled 2014-04-06: qty 1

## 2014-04-06 MED ORDER — CETYLPYRIDINIUM CHLORIDE 0.05 % MT LIQD
7.0000 mL | Freq: Two times a day (BID) | OROMUCOSAL | Status: DC
  Administered 2014-04-08 – 2014-04-21 (×16): 7 mL via OROMUCOSAL

## 2014-04-06 MED ORDER — ACETAMINOPHEN 10 MG/ML IV SOLN
INTRAVENOUS | Status: DC | PRN
  Administered 2014-04-06 (×2): 1 mg via INTRAVENOUS

## 2014-04-06 MED ORDER — LACTATED RINGERS IV SOLN: INTRAVENOUS | Status: DC

## 2014-04-06 MED ORDER — HYDROMORPHONE HCL 1 MG/ML IJ SOLN
0.2500 mg | INTRAMUSCULAR | Status: DC | PRN
  Administered 2014-04-06 (×2): 0.5 mg via INTRAVENOUS

## 2014-04-06 MED ORDER — CHLORHEXIDINE GLUCONATE 0.12 % MT SOLN
15.0000 mL | Freq: Two times a day (BID) | OROMUCOSAL | Status: DC
  Administered 2014-04-06 – 2014-04-20 (×22): 15 mL via OROMUCOSAL
  Filled 2014-04-06 (×35): qty 15

## 2014-04-06 MED ORDER — NEOSTIGMINE METHYLSULFATE 10 MG/10ML IV SOLN
INTRAVENOUS | Status: DC | PRN
  Administered 2014-04-06: 3.5 mg via INTRAVENOUS

## 2014-04-06 MED ORDER — PROPOFOL 10 MG/ML IV BOLUS
INTRAVENOUS | Status: DC | PRN
  Administered 2014-04-06: 170 mg via INTRAVENOUS

## 2014-04-06 MED ORDER — PROMETHAZINE HCL 25 MG/ML IJ SOLN: 6.2500 mg | INTRAMUSCULAR | Status: DC | PRN

## 2014-04-06 MED ORDER — SUCCINYLCHOLINE CHLORIDE 20 MG/ML IJ SOLN
INTRAMUSCULAR | Status: DC | PRN
  Administered 2014-04-06: 100 mg via INTRAVENOUS

## 2014-04-06 MED ORDER — DEXTROSE 5 % IV SOLN
2.0000 g | INTRAVENOUS | Status: AC
  Administered 2014-04-06: 2 g via INTRAVENOUS

## 2014-04-06 MED ORDER — DEXAMETHASONE SODIUM PHOSPHATE 10 MG/ML IJ SOLN
INTRAMUSCULAR | Status: DC | PRN
  Administered 2014-04-06: 10 mg via INTRAVENOUS

## 2014-04-06 MED ORDER — LACTATED RINGERS IV SOLN
INTRAVENOUS | Status: DC | PRN
  Administered 2014-04-06 (×2): via INTRAVENOUS

## 2014-04-06 MED ORDER — ONDANSETRON HCL 4 MG PO TABS: 4.0000 mg | ORAL_TABLET | Freq: Four times a day (QID) | ORAL | Status: DC | PRN

## 2014-04-06 MED ORDER — PROPOFOL 10 MG/ML IV BOLUS
INTRAVENOUS | Status: AC
  Filled 2014-04-06: qty 20

## 2014-04-06 MED ORDER — MIDAZOLAM HCL 2 MG/2ML IJ SOLN
INTRAMUSCULAR | Status: AC
  Filled 2014-04-06: qty 2

## 2014-04-06 SURGICAL SUPPLY — 39 items
APPLICATOR COTTON TIP 6IN STRL (MISCELLANEOUS) ×3 IMPLANT
BLADE EXTENDED COATED 6.5IN (ELECTRODE) IMPLANT
BLADE HEX COATED 2.75 (ELECTRODE) ×3 IMPLANT
CANISTER SUCT 3000ML (MISCELLANEOUS) IMPLANT
CATH ROBINSON RED A/P 14FR (CATHETERS) ×3 IMPLANT
COVER MAYO STAND STRL (DRAPES) IMPLANT
DRAPE LAPAROSCOPIC ABDOMINAL (DRAPES) ×3 IMPLANT
DRAPE WARM FLUID 44X44 (DRAPE) IMPLANT
ELECT REM PT RETURN 9FT ADLT (ELECTROSURGICAL) ×3
ELECTRODE REM PT RTRN 9FT ADLT (ELECTROSURGICAL) ×1 IMPLANT
GAUZE SPONGE 4X4 12PLY STRL (GAUZE/BANDAGES/DRESSINGS) ×3 IMPLANT
GLOVE BIOGEL PI IND STRL 7.0 (GLOVE) ×1 IMPLANT
GLOVE BIOGEL PI INDICATOR 7.0 (GLOVE) ×2
GLOVE SURG ORTHO 8.0 STRL STRW (GLOVE) ×3 IMPLANT
GOWN STRL REUS W/TWL LRG LVL3 (GOWN DISPOSABLE) ×3 IMPLANT
GOWN STRL REUS W/TWL XL LVL3 (GOWN DISPOSABLE) ×6 IMPLANT
KIT BASIN OR (CUSTOM PROCEDURE TRAY) ×3 IMPLANT
MANIFOLD NEPTUNE II (INSTRUMENTS) ×3 IMPLANT
NS IRRIG 1000ML POUR BTL (IV SOLUTION) ×3 IMPLANT
PACK GENERAL/GYN (CUSTOM PROCEDURE TRAY) ×3 IMPLANT
SPONGE LAP 18X18 X RAY DECT (DISPOSABLE) IMPLANT
STAPLER VISISTAT 35W (STAPLE) ×3 IMPLANT
SUCTION POOLE TIP (SUCTIONS) IMPLANT
SUT NOV 1 T60/GS (SUTURE) IMPLANT
SUT PDS AB 1 CT1 27 (SUTURE) ×6 IMPLANT
SUT PROLENE 3 0 SH 48 (SUTURE) ×12 IMPLANT
SUT SILK 2 0 (SUTURE)
SUT SILK 2 0 SH CR/8 (SUTURE) IMPLANT
SUT SILK 2-0 18XBRD TIE 12 (SUTURE) IMPLANT
SUT SILK 3 0 (SUTURE)
SUT SILK 3 0 SH CR/8 (SUTURE) ×3 IMPLANT
SUT SILK 3-0 18XBRD TIE 12 (SUTURE) IMPLANT
SUT VICRYL 2 0 18  UND BR (SUTURE)
SUT VICRYL 2 0 18 UND BR (SUTURE) IMPLANT
TAPE CLOTH SURG 4X10 WHT LF (GAUZE/BANDAGES/DRESSINGS) ×3 IMPLANT
TOWEL OR 17X26 10 PK STRL BLUE (TOWEL DISPOSABLE) ×6 IMPLANT
TRAY FOLEY CATH 14FRSI W/METER (CATHETERS) IMPLANT
WATER STERILE IRR 1500ML POUR (IV SOLUTION) ×3 IMPLANT
YANKAUER SUCT BULB TIP NO VENT (SUCTIONS) IMPLANT

## 2014-04-06 NOTE — Anesthesia Preprocedure Evaluation (Signed)
Anesthesia Evaluation  Patient identified by MRN, date of birth, ID band Patient awake    Reviewed: Allergy & Precautions, H&P , NPO status , Patient's Chart, lab work & pertinent test results  Airway Mallampati: II  TM Distance: >3 FB Neck ROM: Full    Dental no notable dental hx.    Pulmonary neg pulmonary ROS,  breath sounds clear to auscultation  Pulmonary exam normal       Cardiovascular negative cardio ROS  Rhythm:Regular Rate:Normal     Neuro/Psych negative neurological ROS  negative psych ROS   GI/Hepatic negative GI ROS, Neg liver ROS,   Endo/Other  negative endocrine ROS  Renal/GU negative Renal ROS  negative genitourinary   Musculoskeletal negative musculoskeletal ROS (+)   Abdominal   Peds negative pediatric ROS (+)  Hematology negative hematology ROS (+)   Anesthesia Other Findings   Reproductive/Obstetrics negative OB ROS                             Anesthesia Physical Anesthesia Plan  ASA: III  Anesthesia Plan: General   Post-op Pain Management:    Induction: Intravenous, Rapid sequence and Cricoid pressure planned  Airway Management Planned: Oral ETT  Additional Equipment:   Intra-op Plan:   Post-operative Plan: Extubation in OR  Informed Consent: I have reviewed the patients History and Physical, chart, labs and discussed the procedure including the risks, benefits and alternatives for the proposed anesthesia with the patient or authorized representative who has indicated his/her understanding and acceptance.   Dental advisory given  Plan Discussed with: CRNA and Surgeon  Anesthesia Plan Comments:         Anesthesia Quick Evaluation

## 2014-04-06 NOTE — Care Management Note (Signed)
CARE MANAGEMENT NOTE 04/06/2014  Patient:  Billy Romero, Billy Romero   Account Number:  [id number]  Date Initiated:  04/06/2014  Documentation initiated by:  Marney Doctor  Subjective/Objective Assessment:   58 yo admitted with Dysphagia. PMH esophageal cancer with complete esophagus obstruction causing dysphagia.     Action/Plan:   From home with spouse   Anticipated DC Date:  04/07/2014   Anticipated DC Plan:  [place]  CM consult      Choice offered to / List presented to:             Status of service:  In process, will continue to follow Medicare Important Message given?   (If response is "NO", the following Medicare IM given date fields will be blank) Date Medicare IM given:   Medicare IM given by:   Date Additional Medicare IM given:   Additional Medicare IM given by:    Discharge Disposition:    Per UR Regulation:  Reviewed for med. necessity/level of care/duration of stay  If discussed at [place] of Stay Meetings, dates discussed:    Comments:  04/06/14 Marney Doctor RN,BSN,NCM [phone] Chart reviewed and CM following for DC needs.

## 2014-04-06 NOTE — Anesthesia Postprocedure Evaluation (Signed)
  Anesthesia Post-op Note  Patient: Billy Romero  Procedure(s) Performed: Procedure(s) (LRB): OPEN JEJUNOSTOMY TUBE PLACEMENT (N/A)  Patient Location: PACU  Anesthesia Type: General  Level of Consciousness: awake and alert   Airway and Oxygen Therapy: Patient Spontanous Breathing  Post-op Pain: mild  Post-op Assessment: Post-op Vital signs reviewed, Patient's Cardiovascular Status Stable, Respiratory Function Stable, Patent Airway and No signs of Nausea or vomiting  Last Vitals:  Filed Vitals:   04/06/14 1459  BP: 130/60  Pulse: 61  Temp: 36.5 C  Resp: 20    Post-op Vital Signs: stable   Complications: No apparent anesthesia complications

## 2014-04-06 NOTE — Progress Notes (Signed)
Patient Demographics  Billy Romero, is a 58 y.o. male, DOB - [birth year], MLY:[id number]  Admit date - 04/05/2014   Admitting Physician Barton Dubois, MD  Outpatient Primary MD for the patient is Lujean Amel, MD  LOS - 1   No chief complaint on file.     Admission history of present illness/brief narrative:   HPI: Billy Romero is a 57 y.o. 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 Admitted from [place] due to inability to eat or drink and dehydration. Patient reports that symptoms has been steadily progressing and worsening over the last week or two and currently having trouble with keeping down even his own saliva. Plan is for patient to be admitted for IVF's resuscitation and also placement of J tube for tube feedings and nutrition.  Subjective:   Billy Romero today has, No headache, No chest pain, No abdominal pain - No Nausea,   Assessment & Plan    Principal Problem:   Dysphagia, pharyngoesophageal phase Active Problems:   Esophagus cancer   Chronic back pain   Protein-calorie malnutrition, severe   Dysphagia   Malnutrition of moderate degree   Esophageal carcinoma  Dysphagia, pharyngoesophageal phase:  -secondary to complete obstruction of esophagus due to cancer. -continue IVF's resuscitation -Placement open feeding jejunostomy tube by Dr. Scherrie Merritts 12/9 -electrolytes repletion as needed  Esophagus cancer:  -Following with oncology as an outpatient -plan is for radiation to be started next week  Chronic back pain: - will use PRN pain medications and muscle relaxants   Protein-calorie malnutrition, moderate: - due to decrease PO intake and  cancer. -dietician has been consulted -once TF in place will start nutrition   Code Status: Full  Family Communication: None at bedside  Disposition Plan: Remains inpatient   Procedures  -Placement open feeding jejunostomy tube by Dr. Scherrie Merritts 12/9  Consults   Gen. surgery   Medications  Scheduled Meds: . antiseptic oral rinse  7 mL Mouth Rinse q12n4p  . chlorhexidine  15 mL Mouth Rinse BID  . [START ON 04/07/2014] enoxaparin (LOVENOX) injection  40 mg Subcutaneous Q24H  . [START ON 04/07/2014] Influenza vac split quadrivalent PF  0.5 mL Intramuscular Tomorrow-1000  . pantoprazole (PROTONIX) IV  40 mg Intravenous Q24H   Continuous Infusions: . sodium chloride    . dexrose 5 % and 0.45 % NaCl with KCl 30 mEq/L    . lactated ringers     PRN Meds:.acetaminophen **OR** acetaminophen, methocarbamol (ROBAXIN)  IV, morphine injection, ondansetron **OR** ondansetron (ZOFRAN) IV  DVT Prophylaxis  Lovenox -  Lab Results  Component Value Date   PLT 185 04/05/2014    Antibiotics   Anti-infectives    Start     Dose/Rate Route Frequency Ordered Stop   04/06/14 1245  cefOXitin (MEFOXIN) 2 g in dextrose 5 % 50 mL IVPB     2 g100 mL/hr over 30 Minutes Intravenous On call to O.R. 04/06/14 1231 04/06/14 1305          Objective:   Filed Vitals:   04/06/14 1415 04/06/14 1430 04/06/14  1445 04/06/14 1459  BP: 141/81  118/55 130/60  Pulse: 63 55 54 61  Temp: 97.3 F (36.3 C)  97.6 F (36.4 C) 97.7 F (36.5 C)  TempSrc:      Resp: 17 14 12 20   Height:      Weight:      SpO2: 100% 100% 100% 100%    Wt Readings from Last 3 Encounters:  04/05/14 82.555 kg (182 lb)  04/05/14 82.691 kg (182 lb 4.8 oz)  04/04/14 84.505 kg (186 lb 4.8 oz)     Intake/Output Summary (Last 24 hours) at 04/06/14 1552 Last data filed at 04/06/14 1449  Gross per 24 hour  Intake   1500 ml  Output    125 ml  Net   1375 ml     Physical Exam  Awake Alert, Oriented X 3, No new F.N  deficits, Normal affect Billy Romero,PERRAL Supple Neck,No JVD, No cervical lymphadenopathy appriciated.  Symmetrical Chest wall movement, Good air movement bilaterally, CTAB RRR,No Gallops,Rubs or new Murmurs, No Parasternal Heave +ve B.Sounds, Abd Soft, No tenderness, No organomegaly appriciated, No rebound - guarding or rigidity.abd J tube coverd with bandage. No Cyanosis, Clubbing or edema, No new Rash or bruise     Data Review   Micro Results No results found for this or any previous visit (from the past 240 hour(s)).  Radiology Reports No results found.  CBC  Recent Labs Lab 04/05/14 1348  WBC 9.8  HGB 14.5  HCT 42.5  PLT 185  MCV 85.9  MCH 29.3  MCHC 34.1  RDW 12.5  LYMPHSABS 1.5  MONOABS 0.5  EOSABS 0.0  BASOSABS 0.0    Chemistries   Recent Labs Lab 04/05/14 1348  NA 145  K 5.1  CL 104  CO2 29  GLUCOSE 99  BUN 19  CREATININE 1.10  CALCIUM 10.3  MG 2.2  AST 17  ALT 13  ALKPHOS 96  BILITOT 1.1   ------------------------------------------------------------------------------------------------------------------ estimated creatinine clearance is 76.1 mL/min (by C-G formula based on Cr of 1.1). ------------------------------------------------------------------------------------------------------------------ No results for input(s): HGBA1C in the last 72 hours. ------------------------------------------------------------------------------------------------------------------ No results for input(s): CHOL, HDL, LDLCALC, TRIG, CHOLHDL, LDLDIRECT in the last 72 hours. ------------------------------------------------------------------------------------------------------------------  Recent Labs  04/05/14 1348  TSH 0.523   ------------------------------------------------------------------------------------------------------------------ No results for input(s): VITAMINB12, FOLATE, FERRITIN, TIBC, IRON, RETICCTPCT in the last 72 hours.  Coagulation profile No  results for input(s): INR, PROTIME in the last 168 hours.  No results for input(s): DDIMER in the last 72 hours.  Cardiac Enzymes No results for input(s): CKMB, TROPONINI, MYOGLOBIN in the last 168 hours.  Invalid input(s): CK ------------------------------------------------------------------------------------------------------------------ Invalid input(s): POCBNP     Time Spent in minutes   30 minutes   Billy Romero M.D on 04/06/2014 at 3:52 PM  Between 7am to 7pm - Pager - [phone]  After 7pm go to [email or web address] - password [email or web address]  And look for the night coverage person covering for me after hours  [facility] Office  [phone]   **Disclaimer: This note may have been dictated with voice recognition software. Similar sounding words can inadvertently be transcribed and this note may contain transcription errors which may not have been corrected upon publication of note.**

## 2014-04-06 NOTE — Anesthesia Procedure Notes (Signed)
Procedure Name: Intubation Date/Time: 04/06/2014 1:01 PM Performed by: Anne Fu Pre-anesthesia Checklist: Patient identified, Emergency Drugs available, Suction available, Patient being monitored and Timeout performed Patient Re-evaluated:Patient Re-evaluated prior to inductionOxygen Delivery Method: Circle system utilized Preoxygenation: Pre-oxygenation with 100% oxygen Intubation Type: IV induction, Rapid sequence and Cricoid Pressure applied Ventilation: Mask ventilation without difficulty Laryngoscope Size: Mac and 4 Grade View: Grade I Tube type: Oral Tube size: 7.5 mm Number of attempts: 1 Airway Equipment and Method: Stylet Placement Confirmation: ETT inserted through vocal cords under direct vision,  positive ETCO2,  CO2 detector and breath sounds checked- equal and bilateral Secured at: 23 cm Tube secured with: Tape Dental Injury: Teeth and Oropharynx as per pre-operative assessment

## 2014-04-06 NOTE — Transfer of Care (Signed)
Immediate Anesthesia Transfer of Care Note  Patient: Billy Romero  Procedure(s) Performed: Procedure(s) (LRB): PERCUTANEOUS ENDOSCOPIC GASTROSTOMY (PEG) PLACEMENT (N/A)  Patient Location: PACU  Anesthesia Type: General  Level of Consciousness: sedated, patient cooperative and responds to stimulation  Airway & Oxygen Therapy: Patient Spontanous Breathing and Patient connected to face mask oxgen  Post-op Assessment: Report given to PACU RN and Post -op Vital signs reviewed and stable  Post vital signs: Reviewed and stable  Complications: No apparent anesthesia complications

## 2014-04-06 NOTE — Brief Op Note (Signed)
04/05/2014 - 04/06/2014  2:06 PM  PATIENT:  [name]  58 y.o. male  PRE-OPERATIVE DIAGNOSIS:  esophagus cancer  POST-OPERATIVE DIAGNOSIS:  esophagus cancer  PROCEDURE:  Placement open feeding jejunostomy tube  SURGEON:  Surgeon(s) and Role:    * Armandina Gemma, MD - Primary  ANESTHESIA:   general  EBL:  Total I/O In: 1000 [I.V.:1000] Out: 125 [Urine:125]  BLOOD ADMINISTERED:none  DRAINS: none   LOCAL MEDICATIONS USED:  NONE  SPECIMEN:  No Specimen  DISPOSITION OF SPECIMEN:  N/A  COUNTS:  YES  TOURNIQUET:  * No tourniquets in log *  DICTATION: .Other Dictation: Dictation Number [id number]  PLAN OF CARE: Admit to inpatient   PATIENT DISPOSITION:  PACU - hemodynamically stable.   Delay start of Pharmacological VTE agent (>24hrs) due to surgical blood loss or risk of bleeding: yes  Earnstine Regal, MD, [facility] Office: [phone]

## 2014-04-07 ENCOUNTER — Other Ambulatory Visit: Payer: BC Managed Care – PPO

## 2014-04-07 ENCOUNTER — Encounter (HOSPITAL_COMMUNITY): Payer: Self-pay | Admitting: Surgery

## 2014-04-07 DIAGNOSIS — C159 Malignant neoplasm of esophagus, unspecified: Secondary | ICD-10-CM

## 2014-04-07 LAB — CBC
HEMATOCRIT: 38.9 % — AB (ref 39.0–52.0)
Hemoglobin: 13.3 g/dL (ref 13.0–17.0)
MCH: 29.4 pg (ref 26.0–34.0)
MCHC: 34.2 g/dL (ref 30.0–36.0)
MCV: 86.1 fL (ref 78.0–100.0)
Platelets: 171 10*3/uL (ref 150–400)
RBC: 4.52 MIL/uL (ref 4.22–5.81)
RDW: 12.5 % (ref 11.5–15.5)
WBC: 9.3 10*3/uL (ref 4.0–10.5)

## 2014-04-07 LAB — GLUCOSE, CAPILLARY
GLUCOSE-CAPILLARY: 119 mg/dL — AB (ref 70–99)
Glucose-Capillary: 76 mg/dL (ref 70–99)

## 2014-04-07 LAB — BASIC METABOLIC PANEL
Anion gap: 10 (ref 5–15)
BUN: 17 mg/dL (ref 6–23)
CALCIUM: 9.6 mg/dL (ref 8.4–10.5)
CO2: 26 mEq/L (ref 19–32)
CREATININE: 1 mg/dL (ref 0.50–1.35)
Chloride: 100 mEq/L (ref 96–112)
GFR calc non Af Amer: 80 mL/min — ABNORMAL LOW (ref >90)
Glucose, Bld: 140 mg/dL — ABNORMAL HIGH (ref 70–99)
Potassium: 5 mEq/L (ref 3.7–5.3)
Sodium: 136 mEq/L — ABNORMAL LOW (ref 137–147)

## 2014-04-07 MED ORDER — JEVITY 1.2 CAL PO LIQD: 1000.0000 mL | ORAL | Status: DC

## 2014-04-07 MED ORDER — OXYCODONE HCL 5 MG/5ML PO SOLN
5.0000 mg | ORAL | Status: DC | PRN
  Administered 2014-04-07 (×3): 5 mg via ORAL
  Filled 2014-04-07 (×3): qty 5

## 2014-04-07 MED ORDER — OSMOLITE 1.5 CAL PO LIQD
1000.0000 mL | ORAL | Status: DC
  Administered 2014-04-07 – 2014-04-08 (×2): 1000 mL
  Filled 2014-04-07 (×2): qty 1000

## 2014-04-07 MED ORDER — PRO-STAT SUGAR FREE PO LIQD
30.0000 mL | Freq: Two times a day (BID) | ORAL | Status: DC
  Administered 2014-04-07 – 2014-04-10 (×3): 30 mL via ORAL
  Filled 2014-04-07 (×14): qty 30

## 2014-04-07 MED ORDER — OSMOLITE 1.5 CAL PO LIQD: 1000.0000 mL | ORAL | Status: DC

## 2014-04-07 NOTE — Progress Notes (Signed)
Patient ID: Billy Romero, male   DOB: October 11, 1955, 58 y.o.   MRN: [id number]      [address]       [place] [address]             [phone]        Dominyck W Hilgeman [facility] [place] Medical Record #[id number] Date of Birth: 1955/12/15  Referring:Dr B Sherrill Primary Care: Lujean Amel, MD  Chief Complaint:   Obstructing Squamous Cell Esophageal Cancer  History of Present Illness:     Billy Romero is a 58 y.o. 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  He has had many year history of reflux symptoms, since teenager. Past several months he has had increasing difficulty swallowing. Recent outpatient Endoscopy done by Dr Earlean Shawl demonstrated obstruction squamous cell ca 28-30 cm. Admitted from [place] due to inability to eat or drink and dehydration. Patient reports that symptoms has been steadily progressing and worsening over the last week or two and currently having trouble with keeping down even his own saliva. Surgical J tube placed yesterday.  Current Activity/ Functional Status: Patient is independent with mobility/ambulation, transfers, ADL's, IADL's.   Zubrod Score: At the time of surgery this patient's most appropriate activity status/level should be described as: [x]     0    Normal activity, no symptoms []     1    Restricted in physical strenuous activity but ambulatory, able to do out light work []     2    Ambulatory and capable of self care, unable to do work activities, up and about                 more than 50%  Of the time                            []     3    Only limited self care, in bed greater than 50% of waking hours []     4    Completely disabled, no self care, confined to bed or chair []     5    Moribund  Past Medical History  Diagnosis Date  . Esophageal cancer 03/29/14    Past Surgical History  Procedure Laterality Date  . Peg placement N/A 04/06/2014    Procedure: OPEN  JEJUNOSTOMY TUBE PLACEMENT;  Surgeon: Armandina Gemma, MD;  Location: [facility];  Service: General;  Laterality: N/A;    History  Smoking status  . Never Smoker   Smokeless tobacco  .  uses dip/snuff for 25-30 years    History  Alcohol Use No  pervious  Heavy alcohol use, treated with in AA, has been sober for past 9 years  History  Family History  father and paternal grandfather died of leukemia one chronic one acute , both at age 9. No other family history of cancer. He has one brother, 1 sister, one half sister, and 2 half-brothers   Social History  . Marital Status: Married    Spouse Name: N/A    Number of Children: 2  . Years of Education: N/A   Occupational History  . Retired June 2015 form [employer]  . Smoking status: Never Smoker   . Smokeless tobacco: Not on file  . Alcohol Use: No  . Drug Use: Not on file  . Sexual Activity:  Not on file      No Known Allergies  Current Facility-Administered Medications  Medication Dose Route Frequency Provider Last Rate Last Dose  . 0.9 %  sodium chloride infusion   Intravenous Continuous Albertine Patricia, MD 75 mL/hr at 04/07/14 [phone]    . acetaminophen (TYLENOL) tablet 650 mg  650 mg Oral Q6H PRN Barton Dubois, MD       Or  . acetaminophen (TYLENOL) suppository 650 mg  650 mg Rectal Q6H PRN Barton Dubois, MD      . antiseptic oral rinse (CPC / CETYLPYRIDINIUM CHLORIDE 0.05%) solution 7 mL  7 mL Mouth Rinse q12n4p Barton Dubois, MD   Stopped at 04/06/14 1531  . chlorhexidine (PERIDEX) 0.12 % solution 15 mL  15 mL Mouth Rinse BID Barton Dubois, MD   15 mL at 04/06/14 2133  . dextrose 5 % and 0.45 % NaCl with KCl 30 mEq/L infusion   Intravenous Continuous Armandina Gemma, MD      . enoxaparin (LOVENOX) injection 40 mg  40 mg Subcutaneous Q24H Armandina Gemma, MD   40 mg at 04/07/14 1040  . feeding supplement (OSMOLITE 1.5 CAL) liquid 1,000 mL  1,000 mL Per Tube Q24H Clayton Bibles, RD 25 mL/hr at 04/07/14  1655 1,000 mL at 04/07/14 1655  . feeding supplement (PRO-STAT SUGAR FREE 64) liquid 30 mL  30 mL Oral BID Clayton Bibles, RD   30 mL at 04/07/14 1644  . lactated ringers infusion   Intravenous Continuous Lollie Sails, CRNA   0  at 04/06/14 1415  . methocarbamol (ROBAXIN) 500 mg in dextrose 5 % 50 mL IVPB  500 mg Intravenous Q8H PRN Barton Dubois, MD      . morphine 2 MG/ML injection 1-4 mg  1-4 mg Intravenous Q1H PRN Armandina Gemma, MD   2 mg at 04/07/14 0531  . ondansetron (ZOFRAN) tablet 4 mg  4 mg Oral Q6H PRN Barton Dubois, MD       Or  . ondansetron [facility]) injection 4 mg  4 mg Intravenous Q6H PRN Barton Dubois, MD      . oxyCODONE (ROXICODONE) 5 MG/5ML solution 5 mg  5 mg Oral Q4H PRN Albertine Patricia, MD   5 mg at 04/07/14 1644  . pantoprazole (PROTONIX) injection 40 mg  40 mg Intravenous Q24H Barton Dubois, MD   40 mg at 04/06/14 2133    Prescriptions prior to admission  Medication Sig Dispense Refill Last Dose  . baclofen (LIORESAL) 10 MG tablet Take 10 mg by mouth 3 (three) times daily as needed for muscle spasms.   2 Past Week at Unknown time  . HYDROcodone-acetaminophen (NORCO/VICODIN) 5-325 MG per tablet Take 1 tablet by mouth every 6 (six) hours as needed. for pain  0 Past Week at Unknown time  . meloxicam (MOBIC) 15 MG tablet Take 15 mg by mouth daily as needed for pain.   0 Past Month at Unknown time  . PRESCRIPTION MEDICATION Chem [facility]       History reviewed. No pertinent family history.   Review of Systems:     Cardiac Review of Systems: Y or N  Chest Pain [ n   ]  Resting SOB [n   ] Exertional SOB  [n  ]  Orthopnea [ n ]   Pedal Edema [ n  ]    Palpitations [n  ] Syncope  Florencio.Farrier  ]   Presyncope [ n  ]  General Review of Systems: [Y] = yes [  ]=  no Constitional: recent weight change Blue.Reese  ]; anorexia Blue.Reese  ]; fatigue [  ]; nausea Blue.Reese  ]; night sweats [ n ]; fever [ n ]; or chills Florencio.Farrier  ]                                                               Dental: poor dentition[ n ];  Last Dentist visit:   Eye : blurred vision [  ]; diplopia [   ]; vision changes [  ];  Amaurosis fugax[  ]; Resp: cough [  ];  wheezing[n  ];  hemoptysis[ n ]; shortness of breath[n  ]; paroxysmal nocturnal dyspnea[n  ]; dyspnea on exertion[  ]; or orthopnea[  ];  GI:  gallstones[n  ], vomiting[n  ];  dysphagia[  ]; melena[  ];  hematochezia [ n ]; heartburn[  ];   Hx of  Colonoscopy[ y ]; GU: kidney stones [  ]; hematuria[  ];   dysuria [  ];  nocturia[  ];  history of     obstruction [  ]; urinary frequency [  ]             Skin: rash, swelling[  ];, hair loss[  ];  peripheral edema[  ];  or itching[  ]; Musculosketetal: myalgias[  ];  joint swelling[  ];  joint erythema[  ];  joint pain[  ];  back pain[  ];  Heme/Lymph: bruising[ n ];  bleeding[n  ];  anemia[  ];  Neuro: TIA[ n ];  headaches[  ];  stroke[  ];  vertigo[  ];  seizures[  ];   paresthesias[  ];  difficulty walking[  ];  Psych:depression[  ]; anxiety[  ];  Endocrine: diabetes[n  ];  thyroid dysfunction[ n ];  Immunizations: Flu [ n ]; Pneumococcal[n  ];  Other:  Physical Exam: BP 127/78 mmHg  Pulse 60  Temp(Src) 98.5 F (36.9 C) (Oral)  Resp 18  Ht 5\' 11"  (1.803 m)  Wt 182 lb (82.555 kg)  BMI 25.40 kg/m2  SpO2 100%  General appearance: alert, cooperative and appears stated age Neurologic: intact Heart: regular rate and rhythm, S1, S2 normal, no murmur, click, rub or gallop Abdomen: soft, non-tender; bowel sounds normal; no masses,  no organomegaly Extremities: extremities normal, atraumatic, no cyanosis or edema and Homans sign is negative, no sign of DVT Wound: feeding j tube placed to left of midline abdomen functioning No cervical or supraclavicular adenopathy Full pedal pulses bilaterial  Diagnostic Studies & Laboratory data:     Recent Radiology Findings: PET scan pending    Recent Lab Findings: Lab Results  Component Value Date   WBC 9.3 04/07/2014   HGB 13.3 04/07/2014   HCT 38.9* 04/07/2014   PLT  171 04/07/2014   GLUCOSE 140* 04/07/2014   ALT 13 04/05/2014   AST 17 04/05/2014   NA 136* 04/07/2014   K 5.0 04/07/2014   CL 100 04/07/2014   CREATININE 1.00 04/07/2014   BUN 17 04/07/2014   CO2 26 04/07/2014   TSH 0.523 04/05/2014      Assessment / Plan:   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 Agree with starting  chemo radiation treatment with  consideration of post treatment  surgical resection.  Will follow with Dr Benay Spice and see back in office near completion of radiation therapy.  Grace Isaac MD      [address] [place] [address] Office [phone]   [place]  04/07/2014 7:42 PM

## 2014-04-07 NOTE — Progress Notes (Signed)
UR completed 

## 2014-04-07 NOTE — Op Note (Signed)
NAMEMarland Kitchen  RONTAVIOUS, ALBRIGHT NO.:  [id number]  MEDICAL RECORD NO.:  [id number]  LOCATION:  8676                         FACILITY:  [facility]  PHYSICIAN:  Earnstine Regal, MD      DATE OF BIRTH:  28-Dec-1955  DATE OF PROCEDURE:  04/06/2014                              OPERATIVE REPORT   PREOPERATIVE DIAGNOSIS:  Obstructing esophageal carcinoma.  POSTOPERATIVE DIAGNOSIS:  Obstructing esophageal carcinoma.  PROCEDURE:  Open placement of feeding jejunostomy tube.  SURGEON:  Earnstine Regal, MD, FACS  ANESTHESIA:  General per Cleon Dew. Kalman Shan, M.D.  ESTIMATED BLOOD LOSS:  Minimal.  PREPARATION:  ChloraPrep.  COMPLICATIONS:  None.  INDICATIONS:  The patient is a 58 year old male recently diagnosed with squamous cell carcinoma of the distal esophagus.  This was obstructing. The patient is malnourished.  We are requested to place an open feeding jejunostomy tube.  BODY OF REPORT:  Procedure was done in OR #6 at the [facility].  The patient was brought to the operating room, placed in supine position on the operating room table.  Following administration of general anesthesia, the patient was prepped and draped in the usual aseptic fashion.  After ascertaining that an adequate level of anesthesia had been achieved, a midline abdominal incision was made just above the umbilicus.  Dissection was carried through subcutaneous tissues.  Fascia was incised in the midline and the peritoneal cavity was entered cautiously.  Abdomen is explored in a limited fashion through this small incision.  There is no obvious evidence of neoplasm. Peritoneal surfaces were smooth.  The omentum does not appear to contain tumor.  Liver is not palpable from this location. Small bowel was delivered through the incision.  Small bowel was then run from proximal to distal.  There appears to be a slight malrotation in that the small bowel enters the retroperitoneum to the right  of midline.  However, this does appear to be the proximal small bowel.  The distal small bowel decreases in caliber until it enters the cecum and the right lower quadrant.  Therefore, the proximal loop of jejunum was delivered through the wound. A 16-French red rubber Robinson catheter was used for the jejunostomy feeding tube.  The end of the catheter is excised.  Multiple ports were placed along the distal tip of the catheter using the Mayo scissors. The bowel was then prepared with a pursestring 3-0 Prolene suture. Enterotomy was made with the electrocautery.  Red rubber Quentin Cornwall was inserted through the enterotomy and then threaded distally in the small bowel for approximately 20-25 cm.  The pursestring suture was then tied securely.  A 2 cm Witzel tunnel is created with interrupted 3-0 Prolene sutures.  A stab incision was made in the left upper quadrant of the abdominal wall.  Using a tonsil hemostat, the red rubber Robinson catheter was delivered through the abdominal wall.  The jejunum was then sutured to the peritoneum at the site, where the catheter transverses the abdominal wall.  Interrupted 3-0 Prolene sutures are used to completely secure the jejunum to the peritoneum circumferentially around the catheter. Catheter flushes easily with saline and a plug was placed in the catheter.  Catheter secured to the skin with a 3-0 nylon suture.  Good hemostasis was achieved.  Midline incision was closed with running #1 PDS suture.  Subcutaneous tissues were irrigated.  Skin was closed with stainless steel staples.  Sterile dressings were applied.  The patient was awakened from Anesthesia and brought to the recovery room. The patient tolerated the procedure well.   Earnstine Regal, MD, [facility] Office: [phone]    TMG/MEDQ  D:  04/06/2014  T:  04/07/2014  Job:  [id number]  cc:   Lanelle Bal, MD [address] [place] [address]

## 2014-04-07 NOTE — Progress Notes (Signed)
Patient Demographics  Billy Romero, is a 58 y.o. male, DOB - 11-Apr-1956, UTM:[id number]  Admit date - 04/05/2014   Admitting Physician Barton Dubois, MD  Outpatient Primary MD for the patient is Billy Amel, MD  LOS - 2   No chief complaint on file.     Admission history of present illness/brief narrative:   HPI: Billy Romero is a 58 y.o. 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 Admitted from [facility] due to inability to eat or drink and dehydration. Patient reports that symptoms has been steadily progressing and worsening over the last week or two and currently having trouble with keeping down even his own saliva. Plan is for patient to be admitted for IVF's resuscitation and also placement of J tube for tube feedings and nutrition. Patient had Placement open feeding jejunostomy tube by Dr. Scherrie Merritts 12/9.  Subjective:   Billy Romero today has, No headache, No chest pain, No abdominal pain - No Nausea,   Assessment & Plan    Principal Problem:   Dysphagia, pharyngoesophageal phase Active Problems:   Esophagus cancer   Chronic back pain   Protein-calorie malnutrition, severe   Dysphagia   Malnutrition of moderate degree   Esophageal carcinoma  Dysphagia, pharyngoesophageal phase:  -secondary to complete obstruction of esophagus due to cancer. -continue IVF's resuscitation -Placement open feeding jejunostomy tube by Dr. Scherrie Merritts 12/9 -electrolytes repletion as needed  Esophagus cancer:  -Following with oncology as an outpatient -plan is for radiation , radiation oncology follow-up appreciated.  Chronic back pain: - will use PRN pain medications  and muscle relaxants   Protein-calorie malnutrition, moderate: - due to decrease PO intake and cancer. -dietician has been consulted    Code Status: Full  Family Communication: None at bedside  Disposition Plan: Remains inpatient   Procedures  -Placement open feeding jejunostomy tube by Dr. Scherrie Merritts 12/9  Consults   Gen. surgery   Medications  Scheduled Meds: . antiseptic oral rinse  7 mL Mouth Rinse q12n4p  . chlorhexidine  15 mL Mouth Rinse BID  . enoxaparin (LOVENOX) injection  40 mg Subcutaneous Q24H  . feeding supplement (OSMOLITE 1.5 CAL)  1,000 mL Per Tube Q24H  . feeding supplement (PRO-STAT SUGAR FREE 64)  30 mL Oral BID  . Influenza vac split quadrivalent PF  0.5 mL Intramuscular Tomorrow-1000  . pantoprazole (PROTONIX) IV  40 mg Intravenous Q24H   Continuous Infusions: . sodium chloride 75 mL/hr at 04/07/14 0651  . dexrose 5 % and 0.45 % NaCl with KCl 30 mEq/L    . lactated ringers     PRN Meds:.acetaminophen **OR** acetaminophen, methocarbamol (ROBAXIN)  IV, morphine injection, ondansetron **OR** ondansetron (ZOFRAN) IV, oxyCODONE  DVT Prophylaxis  Lovenox -  Lab Results  Component Value Date   PLT 171 04/07/2014    Antibiotics   Anti-infectives    Start     Dose/Rate Route Frequency Ordered Stop   04/06/14 1245  cefOXitin (MEFOXIN) 2 g in dextrose 5 % 50 mL IVPB     2 g100 mL/hr over 30 Minutes  Intravenous On call to O.R. 04/06/14 1231 04/06/14 1305          Objective:   Filed Vitals:   04/06/14 1445 04/06/14 1459 04/06/14 2058 04/07/14 0601  BP: 118/55 130/60 132/67 122/70  Pulse: 54 61 53 50  Temp: 97.6 F (36.4 C) 97.7 F (36.5 C) 97.8 F (36.6 C) 98 F (36.7 C)  TempSrc:   Oral Oral  Resp: 12 20 18 16   Height:      Weight:      SpO2: 100% 100% 100% 100%    Wt Readings from Last 3 Encounters:  04/05/14 82.555 kg (182 lb)  04/05/14 82.691 kg (182 lb 4.8 oz)  04/04/14 84.505 kg (186 lb 4.8 oz)     Intake/Output  Summary (Last 24 hours) at 04/07/14 1214 Last data filed at 04/07/14 0602  Gross per 24 hour  Intake   1500 ml  Output    100 ml  Net   1400 ml     Physical Exam  Awake Alert, Oriented X 3, No new F.N deficits, Normal affect [place].AT,PERRAL Supple Neck,No JVD, No cervical lymphadenopathy appriciated.  Symmetrical Chest wall movement, Good air movement bilaterally, CTAB RRR,No Gallops,Rubs or new Murmurs, No Parasternal Heave +ve B.Sounds, Abd Soft, No tenderness, No organomegaly appriciated, No rebound - guarding or rigidity.abd J tube coverd with bandage. No Cyanosis, Clubbing or edema, No new Rash or bruise     Data Review   Micro Results Recent Results (from the past 240 hour(s))  Surgical pcr screen     Status: None   Collection Time: 04/06/14 12:00 PM  Result Value Ref Range Status   MRSA, PCR NEGATIVE NEGATIVE Final   Staphylococcus aureus NEGATIVE NEGATIVE Final    Comment:        The Xpert SA Assay (FDA approved for NASAL specimens in patients over 57 years of age), is one component of a comprehensive surveillance program.  Test performance has been validated by [employer] for patients greater than or equal to 46 year old. It is not intended to diagnose infection nor to guide or monitor treatment.     Radiology Reports No results found.  CBC  Recent Labs Lab 04/05/14 1348 04/07/14 0620  WBC 9.8 9.3  HGB 14.5 13.3  HCT 42.5 38.9*  PLT 185 171  MCV 85.9 86.1  MCH 29.3 29.4  MCHC 34.1 34.2  RDW 12.5 12.5  LYMPHSABS 1.5  --   MONOABS 0.5  --   EOSABS 0.0  --   BASOSABS 0.0  --     Chemistries   Recent Labs Lab 04/05/14 1348 04/07/14 0620  NA 145 136*  K 5.1 5.0  CL 104 100  CO2 29 26  GLUCOSE 99 140*  BUN 19 17  CREATININE 1.10 1.00  CALCIUM 10.3 9.6  MG 2.2  --   AST 17  --   ALT 13  --   ALKPHOS 96  --   BILITOT 1.1  --     ------------------------------------------------------------------------------------------------------------------ estimated creatinine clearance is 83.7 mL/min (by C-G formula based on Cr of 1). ------------------------------------------------------------------------------------------------------------------ No results for input(s): HGBA1C in the last 72 hours. ------------------------------------------------------------------------------------------------------------------ No results for input(s): CHOL, HDL, LDLCALC, TRIG, CHOLHDL, LDLDIRECT in the last 72 hours. ------------------------------------------------------------------------------------------------------------------  Recent Labs  04/05/14 1348  TSH 0.523   ------------------------------------------------------------------------------------------------------------------ No results for input(s): VITAMINB12, FOLATE, FERRITIN, TIBC, IRON, RETICCTPCT in the last 72 hours.  Coagulation profile No results for input(s): INR, PROTIME in the last 168 hours.  No results for input(s): DDIMER in the last 72 hours.  Cardiac Enzymes No results for input(s): CKMB, TROPONINI, MYOGLOBIN in the last 168 hours.  Invalid input(s): CK ------------------------------------------------------------------------------------------------------------------ Invalid input(s): POCBNP     Time Spent in minutes   30 minutes   Daemian Gahm M.D on 04/07/2014 at 12:14 PM  Between 7am to 7pm - Pager - [phone]  After 7pm go to [email or web address] - password [email or web address]  And look for the night coverage person covering for me after hours  [facility] Office  [phone]   **Disclaimer: This note may have been dictated with voice recognition software. Similar sounding words can inadvertently be transcribed and this note may contain transcription errors which may not have been corrected upon publication of note.**

## 2014-04-07 NOTE — Progress Notes (Signed)
Residuals: 0 ml, rate increased to 25 ml/hr.

## 2014-04-07 NOTE — Progress Notes (Signed)
Patient ID: Billy Romero, male   DOB: Jun 03, 1955, 58 y.o.   MRN: [id number] 1 Day Post-Op  Subjective: Pt feels well this morning, but sore.  Tolerating D10 today.  Objective: Vital signs in last 24 hours: Temp:  [97.3 F (36.3 C)-98.1 F (36.7 C)] 98 F (36.7 C) (12/10 0601) Pulse Rate:  [50-63] 50 (12/10 0601) Resp:  [12-20] 16 (12/10 0601) BP: (118-141)/(55-81) 122/70 mmHg (12/10 0601) SpO2:  [100 %] 100 % (12/10 0601) Last BM Date: 04/05/14  Intake/Output from previous day: 12/09 0701 - 12/10 0700 In: 1500 [I.V.:1500] Out: 225 [Urine:225] Intake/Output this shift:    PE: Abd: soft, appropriately tender, +BS, incision is clean and intact.  J-tube present  Lab Results:   Recent Labs  04/05/14 1348 04/07/14 0620  WBC 9.8 9.3  HGB 14.5 13.3  HCT 42.5 38.9*  PLT 185 171   BMET  Recent Labs  04/05/14 1348 04/07/14 0620  NA 145 136*  K 5.1 5.0  CL 104 100  CO2 29 26  GLUCOSE 99 140*  BUN 19 17  CREATININE 1.10 1.00  CALCIUM 10.3 9.6   PT/INR No results for input(s): LABPROT, INR in the last 72 hours. CMP     Component Value Date/Time   NA 136* 04/07/2014 0620   K 5.0 04/07/2014 0620   CL 100 04/07/2014 0620   CO2 26 04/07/2014 0620   GLUCOSE 140* 04/07/2014 0620   BUN 17 04/07/2014 0620   CREATININE 1.00 04/07/2014 0620   CALCIUM 9.6 04/07/2014 0620   PROT 7.5 04/05/2014 1348   ALBUMIN 4.3 04/05/2014 1348   AST 17 04/05/2014 1348   ALT 13 04/05/2014 1348   ALKPHOS 96 04/05/2014 1348   BILITOT 1.1 04/05/2014 1348   GFRNONAA 80* 04/07/2014 0620   GFRAA >90 04/07/2014 0620   Lipase  No results found for: LIPASE     Studies/Results: No results found.  Anti-infectives: Anti-infectives    Start     Dose/Rate Route Frequency Ordered Stop   04/06/14 1245  cefOXitin (MEFOXIN) 2 g in dextrose 5 % 50 mL IVPB     2 g100 mL/hr over 30 Minutes Intravenous On call to O.R. 04/06/14 1231 04/06/14 1305       Assessment/Plan  1. POD 1 s/p  placement of open j-tube  Plan: 1. Will consult dietician today to initiate enteral tube feeds.  May advance per dietician protocol.   LOS: 2 days    Amory Zbikowski E 04/07/2014, 9:24 AM Pager: [phone]

## 2014-04-07 NOTE — Progress Notes (Signed)
NUTRITION FOLLOW-UP  INTERVENTION: -Initiated Osmolite 1.5 @ 15 ml/hr via J tube and increase by 10 ml every 4 hours to goal rate of 55 ml/hr. -30 ml Prostat BID.   -Tube feeding regimen provides 2180 kcal (100% of needs), 113 grams of protein, and 1005 ml of H2O.  -If IVF discontinued, will require additional 995 ml free water via flushes (250 ml QID) -D/t jejunal tube placement, pt would not benefit from bolus feeding schedule, will require continuous tube feeding -Details for home feedings provided below -RD to continue to monitor  NUTRITION DIAGNOSIS: Inadequate oral intake related to esophageal cancer as evidenced by need for nutrition support, ongoing.  Goal: Pt to meet >/= 90% of their estimated nutrition needs, will meet with TF   Monitor:  TF regimen & tolerance, weight, labs, I/O's  ASSESSMENT: 58 y.o. Admitted with progressive dysphagia. He is now unable to tolerate any liquids. PMHX of Esophagus cancer, squamous cell carcinoma, Weight loss, and ulcerative colitis.  12/8 -Pt reports not being able to drink any liquids, states that all liquids come back up. This has been occuring for the past day. Prior to this, pt was able to drink liquids and consume pureed foods with intermittent tolerance some days. -Pt states that he has lost 15-20 lb x 3-4 months. Pt with 28 lb weight loss, 13% weight loss x 3-4 months, significant for time frame.  -Pt followed by [place] RD, last seen 12/7. Pt is scheduled to begin radiation treatments next week. -RD to follow-up with patient for more education regarding TF regimen when wife is present. Will discuss home feeding regimen before discharge. -Nutrition focused physical exam shows no sign of depletion of muscle mass or body fat.  12/10 -J-tube placed 12/9 -RD consulted for TF initiation and management -Pt reports tolerating TF so far, not yet at goal. No issues. Pt feels much better. -Pt and wife with many questions regarding TF  regimen, transition to home feedings and how the pump works. -RD to review home TF regimen on 12/11 before discharge, Pt's wife would like to be present during education.  Details for Home Feedings Feedings per J-tube TF: Osmolite 1.5 @ goal rate of 110 ml/hr over 12 hours (tentatively 8pm-8am).  30 ml Prostat BID Free water: 60 ml before and after each continuous feeding. Patient will need an additional 875 ml per day (~8 oz QID).  Above to provide: 2180 kcal, 113g protein, and 2000 ml free water  RD to provide TF for home booklet with information above for pt and pt's wife.  Labs reviewed: Mg/Phos/K WNL Low Na Glucose 140  Height: Ht Readings from Last 1 Encounters:  04/05/14 5\' 11"  (1.803 m)    Weight: Wt Readings from Last 1 Encounters:  04/05/14 182 lb (82.555 kg)    BMI:  Body mass index is 25.4 kg/(m^2).  Estimated Nutritional Needs: Kcal: 2000-2200 Protein: 105-115g Fluid: 2L/day  Skin: intact  Diet Order: Diet NPO time specified Except for: Ice Chips  EDUCATION NEEDS: -Education not appropriate at this time   Intake/Output Summary (Last 24 hours) at 04/07/14 0948 Last data filed at 04/07/14 0602  Gross per 24 hour  Intake   1500 ml  Output    225 ml  Net   1275 ml    Last BM: 12/8  Labs:   Recent Labs Lab 04/05/14 1348 04/07/14 0620  NA 145 136*  K 5.1 5.0  CL 104 100  CO2 29 26  BUN 19 17  CREATININE 1.10 1.00  CALCIUM 10.3 9.6  MG 2.2  --   PHOS 3.4  --   GLUCOSE 99 140*    CBG (last 3)  No results for input(s): GLUCAP in the last 72 hours.  Scheduled Meds: . antiseptic oral rinse  7 mL Mouth Rinse q12n4p  . chlorhexidine  15 mL Mouth Rinse BID  . enoxaparin (LOVENOX) injection  40 mg Subcutaneous Q24H  . feeding supplement (OSMOLITE 1.5 CAL)  1,000 mL Per Tube Q24H  . feeding supplement (PRO-STAT SUGAR FREE 64)  30 mL Oral BID  . Influenza vac split quadrivalent PF  0.5 mL Intramuscular Tomorrow-1000  . pantoprazole  (PROTONIX) IV  40 mg Intravenous Q24H    Continuous Infusions: . sodium chloride 75 mL/hr at 04/07/14 0651  . dexrose 5 % and 0.45 % NaCl with KCl 30 mEq/L    . lactated ringers      Clayton Bibles, MS, RD, LDN Pager: [phone] After Hours Pager: [phone]

## 2014-04-07 NOTE — Care Management Note (Signed)
CARE MANAGEMENT NOTE 04/07/2014  Patient:  Billy Romero, Billy Romero   Account Number:  [id number]  Date Initiated:  04/06/2014  Documentation initiated by:  Marney Doctor  Subjective/Objective Assessment:   58 yo admitted with Dysphagia. PMH esophageal cancer with complete esophagus obstruction causing dysphagia.     Action/Plan:   From home with spouse   Anticipated DC Date:  04/07/2014   Anticipated DC Plan:  [place]  CM consult      [place]   Choice offered to / List presented to:  C-1 Patient   DME arranged  TUBE FEEDING  TUBE FEEDING PUMP      DME agency  [place] arranged  HH-1 RN      [place].   Status of service:  In process, will continue to follow Medicare Important Message given?   (If response is "NO", the following Medicare IM given date fields will be blank) Date Medicare IM given:   Medicare IM given by:   Date Additional Medicare IM given:   Additional Medicare IM given by:    Discharge Disposition:    Per UR Regulation:  Reviewed for med. necessity/level of care/duration of stay  If discussed at [place] of Stay Meetings, dates discussed:    Comments:  04/07/14 Marney Doctor RN,BSN,NCM Pt to DC home tomorrow with tube feeds.  HHRN ordered as well as Tube feeding pump.  Awaiting Dietitian recommendations for Tube Feeding.  Pt and wife offered choice and chose [facility] for [facility].  [facility] HH and DME reps alerted of referrals.  Spoke with staff RN who has already begun teaching with the pt and wife on the tube feedings. CM will continue to follow and assist as needed.  04/06/14 Marney Doctor RN,BSN,NCM [phone] Chart reviewed and CM following for DC needs.

## 2014-04-07 NOTE — Consult Note (Signed)
I was scheduled to see the patient as an outpatient for discussion of radiation treatment as part of his initial treatment for his new diagnosis of esophageal cancer. The patient developed worsening dysphagia that required placement of a J-tube which was completed earlier today.  I discussed with the patient the role of a 5 1/2 week course of radiation treatment. Concurrent chemotherapy will be given. A PET scan is pending. The patient will be scheduled for a simulation on Monday 04/11/14. We will plan to begin radiation treatment on 12/21.

## 2014-04-08 ENCOUNTER — Inpatient Hospital Stay (HOSPITAL_COMMUNITY): Payer: BC Managed Care – PPO

## 2014-04-08 ENCOUNTER — Observation Stay (HOSPITAL_COMMUNITY): Payer: BC Managed Care – PPO

## 2014-04-08 ENCOUNTER — Other Ambulatory Visit: Payer: Self-pay | Admitting: *Deleted

## 2014-04-08 LAB — CBC
HCT: 40.8 % (ref 39.0–52.0)
Hemoglobin: 13.7 g/dL (ref 13.0–17.0)
MCH: 29 pg (ref 26.0–34.0)
MCHC: 33.6 g/dL (ref 30.0–36.0)
MCV: 86.3 fL (ref 78.0–100.0)
Platelets: 192 10*3/uL (ref 150–400)
RBC: 4.73 MIL/uL (ref 4.22–5.81)
RDW: 12.8 % (ref 11.5–15.5)
WBC: 8.6 10*3/uL (ref 4.0–10.5)

## 2014-04-08 LAB — GLUCOSE, CAPILLARY
Glucose-Capillary: 109 mg/dL — ABNORMAL HIGH (ref 70–99)
Glucose-Capillary: 118 mg/dL — ABNORMAL HIGH (ref 70–99)
Glucose-Capillary: 129 mg/dL — ABNORMAL HIGH (ref 70–99)
Glucose-Capillary: 132 mg/dL — ABNORMAL HIGH (ref 70–99)
Glucose-Capillary: 137 mg/dL — ABNORMAL HIGH (ref 70–99)

## 2014-04-08 LAB — BASIC METABOLIC PANEL
Anion gap: 10 (ref 5–15)
BUN: 17 mg/dL (ref 6–23)
CO2: 27 mEq/L (ref 19–32)
Calcium: 9.2 mg/dL (ref 8.4–10.5)
Chloride: 102 mEq/L (ref 96–112)
Creatinine, Ser: 0.99 mg/dL (ref 0.50–1.35)
GFR, EST NON AFRICAN AMERICAN: 87 mL/min — AB (ref >90)
Glucose, Bld: 128 mg/dL — ABNORMAL HIGH (ref 70–99)
POTASSIUM: 3.8 meq/L (ref 3.7–5.3)
SODIUM: 139 meq/L (ref 137–147)

## 2014-04-08 MED ORDER — PROCHLORPERAZINE EDISYLATE 5 MG/ML IJ SOLN
10.0000 mg | Freq: Four times a day (QID) | INTRAMUSCULAR | Status: DC | PRN
  Administered 2014-04-08 – 2014-04-10 (×6): 10 mg via INTRAVENOUS
  Filled 2014-04-08 (×6): qty 2

## 2014-04-08 MED ORDER — ONDANSETRON 8 MG/NS 50 ML IVPB
8.0000 mg | INTRAVENOUS | Status: DC | PRN
  Administered 2014-04-08 – 2014-04-21 (×9): 8 mg via INTRAVENOUS
  Filled 2014-04-08 (×15): qty 8

## 2014-04-08 MED ORDER — OSMOLITE 1.5 CAL PO LIQD: 1000.0000 mL | ORAL | Status: DC

## 2014-04-08 MED ORDER — PROCHLORPERAZINE MALEATE 10 MG PO TABS: 10.0000 mg | ORAL_TABLET | Freq: Four times a day (QID) | ORAL | Status: DC | PRN

## 2014-04-08 MED ORDER — ONDANSETRON HCL 4 MG/2ML IJ SOLN
4.0000 mg | INTRAMUSCULAR | Status: DC | PRN
  Administered 2014-04-08 (×3): 4 mg via INTRAVENOUS
  Filled 2014-04-08 (×3): qty 2

## 2014-04-08 MED ORDER — DEXAMETHASONE 2 MG PO TABS: 10.0000 mg | ORAL_TABLET | ORAL | Status: DC

## 2014-04-08 MED ORDER — IOHEXOL 300 MG/ML  SOLN
100.0000 mL | Freq: Once | INTRAMUSCULAR | Status: AC | PRN
  Administered 2014-04-08: 100 mL via INTRAVENOUS

## 2014-04-08 MED ORDER — ONDANSETRON HCL 8 MG PO TABS
8.0000 mg | ORAL_TABLET | ORAL | Status: DC | PRN
Start: 2014-04-08 — End: 2014-04-21

## 2014-04-08 MED ORDER — OSMOLITE 1.5 CAL PO LIQD
1000.0000 mL | ORAL | Status: DC
  Administered 2014-04-09: 1000 mL
  Filled 2014-04-08 (×3): qty 1000

## 2014-04-08 MED ORDER — ONDANSETRON HCL 4 MG PO TABS: 4.0000 mg | ORAL_TABLET | Freq: Four times a day (QID) | ORAL | Status: DC | PRN

## 2014-04-08 MED ORDER — METOCLOPRAMIDE HCL 5 MG/ML IJ SOLN
5.0000 mg | Freq: Three times a day (TID) | INTRAMUSCULAR | Status: DC | PRN
  Administered 2014-04-08 – 2014-04-19 (×11): 5 mg via INTRAVENOUS
  Filled 2014-04-08 (×11): qty 2

## 2014-04-08 NOTE — Progress Notes (Signed)
Tube feeding restarted at 50ml/hr. Pt states that if he starts getting sick he wants the t/f stopped. Wife at bedside. Will cont to monitor patient.

## 2014-04-08 NOTE — Progress Notes (Signed)
He is recovering from the jejunostomy feeding tube placement.  The plan is to begin chemotherapy and radiation during the week of 04/18/2014.  I will be out until 04/13/2014.  Please call oncology as needed. We will schedule outpatient follow-up.

## 2014-04-08 NOTE — Progress Notes (Signed)
UR completed 

## 2014-04-08 NOTE — Progress Notes (Addendum)
NUTRITION FOLLOW-UP  INTERVENTION: - Once medically able, recommend re-initiation Osmolite 1.5 @ 15 ml/hr via J tube and increase by 10 ml every 4 hours to goal rate of 55 ml/hr. -30 ml Prostat BID.   -Tube feeding regimen provides 2180 kcal (100% of needs), 113 grams of protein, and 1005 ml of H2O.  -If IVF discontinued, will require additional 995 ml free water via flushes (250 ml QID) -D/t jejunal tube placement, pt would not benefit from bolus feeding schedule, will require continuous tube feeding -Details for home feedings provided below -Questions answered regarding home TF regimen. Provided pt and wife with "Tube Feeding at Home" booklet.  -RD to continue to monitor  NUTRITION DIAGNOSIS: Inadequate oral intake related to esophageal cancer as evidenced by need for nutrition support, ongoing.  Goal: Pt to meet >/= 90% of their estimated nutrition needs, will meet with TF   Monitor:  TF regimen & tolerance, weight, labs, I/O's  ASSESSMENT: 58 y.o. Admitted with progressive dysphagia. He is now unable to tolerate any liquids. PMHX of Esophagus cancer, squamous cell carcinoma, Weight loss, and ulcerative colitis.  12/8 -Pt reports not being able to drink any liquids, states that all liquids come back up. This has been occuring for the past day. Prior to this, pt was able to drink liquids and consume pureed foods with intermittent tolerance some days. -Pt states that he has lost 15-20 lb x 3-4 months. Pt with 28 lb weight loss, 13% weight loss x 3-4 months, significant for time frame.  -Pt followed by [place] RD, last seen 12/7. Pt is scheduled to begin radiation treatments next week. -RD to follow-up with patient for more education regarding TF regimen when wife is present. Will discuss home feeding regimen before discharge. -Nutrition focused physical exam shows no sign of depletion of muscle mass or body fat.  12/10 -J-tube placed 12/9 -RD consulted for TF initiation and  management -Pt reports tolerating TF so far, not yet at goal. No issues. Pt feels much better. -Pt and wife with many questions regarding TF regimen, transition to home feedings and how the pump works. -RD to review home TF regimen on 12/11 before discharge, Pt's wife would like to be present during education.  12/11: - Pt's TF held due to pt experiencing nausea and vomiting. Per MD note, TF to be re-started at a lower rate. No indication of ileus or blockage.  - Spoke with pt's wife regarding home TF regimen as RD not available tomorrow. Questions were answered. RD to follow-up with pt and wife on Sunday.   Details for Home Feedings Feedings per J-tube TF: Osmolite 1.5 @ goal rate of 110 ml/hr over 12 hours (tentatively 8pm-8am).  30 ml Prostat BID Free water: 60 ml before and after each continuous feeding. Patient will need an additional 875 ml per day (~8 oz QID).  Above to provide: 2180 kcal, 113g protein, and 2000 ml free water  Labs reviewed: Mg/Phos/K WNL Low Na Glucose 140  Height: Ht Readings from Last 1 Encounters:  04/05/14 5' 11"  (1.803 m)    Weight: Wt Readings from Last 1 Encounters:  04/08/14 185 lb 6.5 oz (84.1 kg)    BMI:  Body mass index is 25.87 kg/(m^2).  Estimated Nutritional Needs: Kcal: 2000-2200 Protein: 105-115g Fluid: 2L/day  Skin: intact  Diet Order: Diet NPO time specified Except for: Ice Chips  EDUCATION NEEDS: -Education needs met   Intake/Output Summary (Last 24 hours) at 04/08/14 1348 Last data filed at 04/08/14  7867  Gross per 24 hour  Intake 3188.6 ml  Output    750 ml  Net 2438.6 ml    Last BM: 12/11  Labs:   Recent Labs Lab 04/05/14 1348 04/07/14 0620 04/08/14 0515  NA 145 136* 139  K 5.1 5.0 3.8  CL 104 100 102  CO2 29 26 27   BUN 19 17 17   CREATININE 1.10 1.00 0.99  CALCIUM 10.3 9.6 9.2  MG 2.2  --   --   PHOS 3.4  --   --   GLUCOSE 99 140* 128*    CBG (last 3)   Recent Labs  04/07/14 2359  04/08/14 0404 04/08/14 0725  GLUCAP 118* 109* 129*    Scheduled Meds: . antiseptic oral rinse  7 mL Mouth Rinse q12n4p  . chlorhexidine  15 mL Mouth Rinse BID  . enoxaparin (LOVENOX) injection  40 mg Subcutaneous Q24H  . feeding supplement (OSMOLITE 1.5 CAL)  1,000 mL Per Tube Q24H  . feeding supplement (PRO-STAT SUGAR FREE 64)  30 mL Oral BID  . pantoprazole (PROTONIX) IV  40 mg Intravenous Q24H    Continuous Infusions: . sodium chloride 75 mL/hr at 04/08/14 1316  . dexrose 5 % and 0.45 % NaCl with KCl 30 mEq/L    . lactated ringers      Laurette Schimke MS, RD, LDN

## 2014-04-08 NOTE — Progress Notes (Addendum)
When pt vomited, this RN saw a quarter sized clot of blood, the rest of pt's vomit was green bile. Dr. Grandville Silos on call notified of blood clot. CT of abdomen pending. Will continue to monitor pt. Noreene Larsson RN, BSN  Received order from Dr. Grandville Silos to stop tube feed and clamp J tube. Order implemented. This RN to continue to monitor. Noreene Larsson RN, BSN

## 2014-04-08 NOTE — Progress Notes (Signed)
Per Dr. Benay Spice : Cancel lab/chemo on 12/16 (done). Keep 12/23 L/OV/chemo. Keep nutrition and add chemo teaching to 12/16. POF sent to scheduler.

## 2014-04-08 NOTE — Progress Notes (Signed)
Patient ID: Billy Romero, male   DOB: 07-Apr-1956, 58 y.o.   MRN: [id number] 2 Days Post-Op  Subjective: Pt with nausea and vomiting this morning. No c/o increased abdominal pain  Objective: Vital signs in last 24 hours: Temp:  [98 F (36.7 C)-98.5 F (36.9 C)] 98 F (36.7 C) (12/11 0412) Pulse Rate:  [60-70] 64 (12/11 0412) Resp:  [16-18] 18 (12/11 0412) BP: (120-149)/(72-78) 149/77 mmHg (12/11 0412) SpO2:  [100 %] 100 % (12/11 0412) Weight:  [185 lb 6.5 oz (84.1 kg)] 185 lb 6.5 oz (84.1 kg) (12/11 0412) Last BM Date: 04/05/14  Intake/Output from previous day: 12/10 0701 - 12/11 0700 In: 3188.6 [I.V.:2358.6; NG/GT:830] Out: 750 [Urine:750] Intake/Output this shift:    PE: Abd: soft, hypoactive BS, ND, incision and J-tube site c/d/i  Lab Results:   Recent Labs  04/07/14 0620 04/08/14 0515  WBC 9.3 8.6  HGB 13.3 13.7  HCT 38.9* 40.8  PLT 171 192   BMET  Recent Labs  04/07/14 0620 04/08/14 0515  NA 136* 139  K 5.0 3.8  CL 100 102  CO2 26 27  GLUCOSE 140* 128*  BUN 17 17  CREATININE 1.00 0.99  CALCIUM 9.6 9.2   PT/INR No results for input(s): LABPROT, INR in the last 72 hours. CMP     Component Value Date/Time   NA 139 04/08/2014 0515   K 3.8 04/08/2014 0515   CL 102 04/08/2014 0515   CO2 27 04/08/2014 0515   GLUCOSE 128* 04/08/2014 0515   BUN 17 04/08/2014 0515   CREATININE 0.99 04/08/2014 0515   CALCIUM 9.2 04/08/2014 0515   PROT 7.5 04/05/2014 1348   ALBUMIN 4.3 04/05/2014 1348   AST 17 04/05/2014 1348   ALT 13 04/05/2014 1348   ALKPHOS 96 04/05/2014 1348   BILITOT 1.1 04/05/2014 1348   GFRNONAA 87* 04/08/2014 0515   GFRAA >90 04/08/2014 0515   Lipase  No results found for: LIPASE     Studies/Results: No results found.  Anti-infectives: Anti-infectives    Start     Dose/Rate Route Frequency Ordered Stop   04/06/14 1245  cefOXitin (MEFOXIN) 2 g in dextrose 5 % 50 mL IVPB     2 g100 mL/hr over 30 Minutes Intravenous On call to  O.R. 04/06/14 1231 04/06/14 1305       Assessment/Plan  1. POD 2, s/p open placement of J-tube 2. N/V  Plan: 1. Will get an ABX today to evaluate bowel gas pattern for ileus vs some other source for bilious emesis.  TFs on hold for now.  Increase Zofran to q4 hrs to control nausea better.   LOS: 3 days    [name] 04/08/2014, 8:26 AM Pager: [phone]

## 2014-04-08 NOTE — Progress Notes (Signed)
Patient Demographics  Billy Romero, is a 58 y.o. male, DOB - [birth year], RDE:[id number]  Admit date - 04/05/2014   Admitting Physician Barton Dubois, MD  Outpatient Primary MD for the patient is Lujean Amel, MD  LOS - 3   No chief complaint on file.     Admission history of present illness/brief narrative:   HPI: Billy Romero is a 58 y.o. 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 Admitted from [facility] due to inability to eat or drink and dehydration. Patient reports that symptoms has been steadily progressing and worsening over the last week or two and currently having trouble with keeping down even his own saliva. Plan is for patient to be admitted for IVF's resuscitation and also placement of J tube for tube feedings and nutrition. Patient had Placement open feeding jejunostomy tube by Dr. Scherrie Merritts 12/9. She hasn't had significant nausea and vomiting after his tube feeds were started, repeat KUB does not show any evidence of obstruction or significant abnormalities, so 2. Will be resumed at a lower rate.  Subjective:   [name] today has, No headache, No chest pain, No abdominal pain - had nausea and vomiting yesterday evening.  Assessment & Plan    Principal Problem:   Dysphagia, pharyngoesophageal phase Active Problems:   Esophagus cancer   Chronic back pain   Protein-calorie malnutrition, severe   Dysphagia   Malnutrition of moderate degree   Esophageal carcinoma  Dysphagia, pharyngoesophageal phase:  -secondary to complete obstruction of esophagus due to cancer. -continue IVF's resuscitation -Placement open feeding jejunostomy tube by Dr. Scherrie Merritts 12/9 -electrolytes repletion as needed -Nausea and vomiting this a.m., but no obstruction or significant abnormalities on abdominal x-ray, will resume tube feeds but at a lower rate.  Esophagus cancer:  -Following with oncology as an outpatient -plan is for radiation , radiation oncology follow-up appreciated. -CT surgery follow-up is appreciated.  Chronic back pain: - will use PRN pain medications and muscle relaxants   Protein-calorie malnutrition, moderate: - due to decrease PO intake and cancer. -dietician has been consulted    Code Status: Full  Family Communication: None at bedside  Disposition Plan: Remains inpatient   Procedures  -Placement open feeding jejunostomy tube by Dr. Scherrie Merritts 12/9  Consults   Gen. surgery   Medications  Scheduled Meds: . antiseptic oral rinse  7 mL Mouth Rinse q12n4p  . chlorhexidine  15 mL Mouth Rinse BID  . enoxaparin (LOVENOX) injection  40 mg Subcutaneous Q24H  . feeding supplement (OSMOLITE 1.5 CAL)  1,000 mL Per Tube Q24H  . feeding supplement (PRO-STAT SUGAR FREE 64)  30 mL Oral BID  . pantoprazole (PROTONIX) IV  40 mg Intravenous Q24H   Continuous Infusions: . sodium chloride 75 mL/hr at 04/07/14 2016  . dexrose 5 % and 0.45 % NaCl with KCl 30 mEq/L    . lactated ringers     PRN Meds:.acetaminophen **OR** acetaminophen, methocarbamol (ROBAXIN)  IV, metoCLOPramide (REGLAN) injection, morphine injection, ondansetron **OR** ondansetron (ZOFRAN) IV, oxyCODONE  DVT Prophylaxis  Lovenox -  Lab  Results  Component Value Date   PLT 192 04/08/2014    Antibiotics   Anti-infectives    Start     Dose/Rate Route Frequency Ordered Stop   04/06/14 1245  cefOXitin (MEFOXIN) 2 g in dextrose 5 % 50 mL IVPB     2 g100 mL/hr over 30 Minutes Intravenous On call to O.R. 04/06/14 1231 04/06/14 1305          Objective:   Filed Vitals:   04/07/14 1000 04/07/14 1430 04/07/14 2005 04/08/14 0412  BP: 120/72 127/78 124/73  149/77  Pulse: 62 60 70 64  Temp: 98.2 F (36.8 C) 98.5 F (36.9 C) 98.2 F (36.8 C) 98 F (36.7 C)  TempSrc: Oral Oral Oral Oral  Resp: 18 18 16 18   Height:      Weight:    84.1 kg (185 lb 6.5 oz)  SpO2: 100% 100% 100% 100%    Wt Readings from Last 3 Encounters:  04/08/14 84.1 kg (185 lb 6.5 oz)  04/05/14 82.691 kg (182 lb 4.8 oz)  04/04/14 84.505 kg (186 lb 4.8 oz)     Intake/Output Summary (Last 24 hours) at 04/08/14 1250 Last data filed at 04/08/14 0526  Gross per 24 hour  Intake 3188.6 ml  Output    750 ml  Net 2438.6 ml     Physical Exam  Awake Alert, Oriented X 3, No new F.N deficits, Normal affect [place].AT,PERRAL Supple Neck,No JVD, No cervical lymphadenopathy appriciated.  Symmetrical Chest wall movement, Good air movement bilaterally, CTAB RRR,No Gallops,Rubs or new Murmurs, No Parasternal Heave +ve B.Sounds, Abd Soft, No tenderness, No organomegaly appriciated, No rebound - guarding or rigidity.abd J tube coverd with bandage. No Cyanosis, Clubbing or edema, No new Rash or bruise     Data Review   Micro Results Recent Results (from the past 240 hour(s))  Surgical pcr screen     Status: None   Collection Time: 04/06/14 12:00 PM  Result Value Ref Range Status   MRSA, PCR NEGATIVE NEGATIVE Final   Staphylococcus aureus NEGATIVE NEGATIVE Final    Comment:        The Xpert SA Assay (FDA approved for NASAL specimens in patients over 79 years of age), is one component of a comprehensive surveillance program.  Test performance has been validated by [employer] for patients greater than or equal to 48 year old. It is not intended to diagnose infection nor to guide or monitor treatment.     Radiology Reports Dg Abd 2 Views  04/08/2014   CLINICAL DATA:  Status post J-tube placement  EXAM: ABDOMEN - 2 VIEW  COMPARISON:  None.  FINDINGS: There is a percutaneous jejunostomy to which appears to anterior from the left of midline. This crosses the midline  and terminates within the right upper quadrant of the abdomen. Skin staples are identified along the midline of the abdomen. The bowel gas pattern appears normal.  IMPRESSION: 1. Jejunostomy tube positioned as above.   Electronically Signed   By: Kerby Moors M.D.   On: 04/08/2014 09:40    CBC  Recent Labs Lab 04/05/14 1348 04/07/14 0620 04/08/14 0515  WBC 9.8 9.3 8.6  HGB 14.5 13.3 13.7  HCT 42.5 38.9* 40.8  PLT 185 171 192  MCV 85.9 86.1 86.3  MCH 29.3 29.4 29.0  MCHC 34.1 34.2 33.6  RDW 12.5 12.5 12.8  LYMPHSABS 1.5  --   --   MONOABS 0.5  --   --   EOSABS 0.0  --   --  BASOSABS 0.0  --   --     Chemistries   Recent Labs Lab 04/05/14 1348 04/07/14 0620 04/08/14 0515  NA 145 136* 139  K 5.1 5.0 3.8  CL 104 100 102  CO2 29 26 27   GLUCOSE 99 140* 128*  BUN 19 17 17   CREATININE 1.10 1.00 0.99  CALCIUM 10.3 9.6 9.2  MG 2.2  --   --   AST 17  --   --   ALT 13  --   --   ALKPHOS 96  --   --   BILITOT 1.1  --   --    ------------------------------------------------------------------------------------------------------------------ estimated creatinine clearance is 84.5 mL/min (by C-G formula based on Cr of 0.99). ------------------------------------------------------------------------------------------------------------------ No results for input(s): HGBA1C in the last 72 hours. ------------------------------------------------------------------------------------------------------------------ No results for input(s): CHOL, HDL, LDLCALC, TRIG, CHOLHDL, LDLDIRECT in the last 72 hours. ------------------------------------------------------------------------------------------------------------------  Recent Labs  04/05/14 1348  TSH 0.523   ------------------------------------------------------------------------------------------------------------------ No results for input(s): VITAMINB12, FOLATE, FERRITIN, TIBC, IRON, RETICCTPCT in the last 72 hours.  Coagulation  profile No results for input(s): INR, PROTIME in the last 168 hours.  No results for input(s): DDIMER in the last 72 hours.  Cardiac Enzymes No results for input(s): CKMB, TROPONINI, MYOGLOBIN in the last 168 hours.  Invalid input(s): CK ------------------------------------------------------------------------------------------------------------------ Invalid input(s): POCBNP     Time Spent in minutes   25 minutes   Brallan Denio M.D on 04/08/2014 at 12:50 PM  Between 7am to 7pm - Pager - [phone]  After 7pm go to [email or web address] - password [email or web address]  And look for the night coverage person covering for me after hours  [facility] Office  [phone]   **Disclaimer: This note may have been dictated with voice recognition software. Similar sounding words can inadvertently be transcribed and this note may contain transcription errors which may not have been corrected upon publication of note.**

## 2014-04-08 NOTE — Progress Notes (Signed)
CARE MANAGEMENT NOTE 04/08/2014  Patient:  [place], ANTOLIN   Account Number:  [id number]  Date Initiated:  04/06/2014  Documentation initiated by:  Marney Doctor  Subjective/Objective Assessment:   58 yo admitted with Dysphagia. PMH esophageal cancer with complete esophagus obstruction causing dysphagia.     Action/Plan:   From home with spouse   Anticipated DC Date:  04/07/2014   Anticipated DC Plan:  [place]  CM consult      [place]   Choice offered to / List presented to:  C-1 Patient   DME arranged  TUBE FEEDING  TUBE FEEDING PUMP      DME agency  [place] arranged  HH-1 RN      [place].   Status of service:  In process, will continue to follow Medicare Important Message given?   (If response is "NO", the following Medicare IM given date fields will be blank) Date Medicare IM given:   Medicare IM given by:   Date Additional Medicare IM given:   Additional Medicare IM given by:    Discharge Disposition:  [place]  Per UR Regulation:  Reviewed for med. necessity/level of care/duration of stay  If discussed at [place] of Stay Meetings, dates discussed:    Comments:  04/08/14 MMcGibboney, RN, BSN [place] will follow pt at discharge. Tube Feed orders may change in am, however orders are in under [facility] notes.   04/07/14 Marney Doctor RN,BSN,NCM Pt to DC home tomorrow with tube feeds.  HHRN ordered as well as Tube feeding pump.  Awaiting Dietitian recommendations for Tube Feeding.  Pt and wife offered choice and chose [facility] for [facility] services.  [facility] HH and DME reps alerted of referrals.  Spoke with staff RN who has already begun teaching with the pt and wife on the tube feedings. CM will continue to follow and assist as needed.  04/06/14 Marney Doctor RN,BSN,NCM [phone] Chart reviewed and CM following for DC  needs.

## 2014-04-09 LAB — GLUCOSE, CAPILLARY
GLUCOSE-CAPILLARY: 117 mg/dL — AB (ref 70–99)
GLUCOSE-CAPILLARY: 125 mg/dL — AB (ref 70–99)
Glucose-Capillary: 129 mg/dL — ABNORMAL HIGH (ref 70–99)
Glucose-Capillary: 129 mg/dL — ABNORMAL HIGH (ref 70–99)
Glucose-Capillary: 132 mg/dL — ABNORMAL HIGH (ref 70–99)

## 2014-04-09 LAB — BASIC METABOLIC PANEL
Anion gap: 14 (ref 5–15)
BUN: 14 mg/dL (ref 6–23)
CALCIUM: 9.3 mg/dL (ref 8.4–10.5)
CO2: 24 meq/L (ref 19–32)
CREATININE: 0.88 mg/dL (ref 0.50–1.35)
Chloride: 102 mEq/L (ref 96–112)
GFR calc Af Amer: >90 mL/min (ref >90)
GLUCOSE: 139 mg/dL — AB (ref 70–99)
Potassium: 3.4 mEq/L — ABNORMAL LOW (ref 3.7–5.3)
SODIUM: 140 meq/L (ref 137–147)

## 2014-04-09 LAB — CBC
HCT: 42.1 % (ref 39.0–52.0)
Hemoglobin: 14.5 g/dL (ref 13.0–17.0)
MCH: 29.4 pg (ref 26.0–34.0)
MCHC: 34.4 g/dL (ref 30.0–36.0)
MCV: 85.2 fL (ref 78.0–100.0)
Platelets: 188 10*3/uL (ref 150–400)
RBC: 4.94 MIL/uL (ref 4.22–5.81)
RDW: 12.6 % (ref 11.5–15.5)
WBC: 12 10*3/uL — ABNORMAL HIGH (ref 4.0–10.5)

## 2014-04-09 MED ORDER — LORAZEPAM 2 MG/ML IJ SOLN
1.0000 mg | Freq: Once | INTRAMUSCULAR | Status: AC
  Administered 2014-04-09: 1 mg via INTRAVENOUS
  Filled 2014-04-09: qty 1

## 2014-04-09 MED ORDER — MORPHINE SULFATE 2 MG/ML IJ SOLN
1.0000 mg | INTRAMUSCULAR | Status: DC | PRN
  Administered 2014-04-11: 2 mg via INTRAVENOUS
  Administered 2014-04-13: 4 mg via INTRAVENOUS
  Administered 2014-04-13: 2 mg via INTRAVENOUS
  Administered 2014-04-14 – 2014-04-21 (×12): 4 mg via INTRAVENOUS
  Filled 2014-04-09 (×11): qty 2
  Filled 2014-04-09: qty 1
  Filled 2014-04-09 (×2): qty 2
  Filled 2014-04-09: qty 1

## 2014-04-09 MED ORDER — POTASSIUM CHLORIDE IN NACL 20-0.9 MEQ/L-% IV SOLN
INTRAVENOUS | Status: AC
  Administered 2014-04-09: 16:00:00 via INTRAVENOUS
  Filled 2014-04-09: qty 1000

## 2014-04-09 MED ORDER — SODIUM CHLORIDE 0.9 % IV SOLN
INTRAVENOUS | Status: DC
  Administered 2014-04-10: 03:00:00 via INTRAVENOUS

## 2014-04-09 NOTE — Progress Notes (Signed)
Pt c/o continued vomiting despite additional PRN nausea med administration. Dr. Grandville Silos notified and order received to give an additional dose of IV zofran. Med administered, this RN to continue to monitor. Noreene Larsson RN, BSN

## 2014-04-09 NOTE — Progress Notes (Signed)
Patient Demographics  Billy Romero, is a 58 y.o. male, DOB - [birth year], GEZ:[id number]  Admit date - 04/05/2014   Admitting Physician Barton Dubois, MD  Outpatient Primary MD for the patient is Lujean Amel, MD  LOS - 4   No chief complaint on file.     Admission history of present illness/brief narrative:   HPI: Billy Romero is a 58 y.o. 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 Admitted from [place] due to inability to eat or drink and dehydration. Patient reports that symptoms has been steadily progressing and worsening over the last week or two and currently having trouble with keeping down even his own saliva. Plan is for patient to be admitted for IVF's resuscitation and also placement of J tube for tube feedings and nutrition. Patient had Placement open feeding jejunostomy tube by Dr. Scherrie Merritts 12/9. He had significant nausea and vomiting after his tube feeds were started, CT abdomen with contrast showing evidence of narrowing at the insertion type, as well having a kink at the insertion site . Subjective:   [name] today has, No headache, No chest pain, No abdominal pain - had nausea and vomiting yesterday.  Assessment & Plan    Principal Problem:   Dysphagia, pharyngoesophageal phase Active Problems:   Esophagus cancer   Chronic back pain   Protein-calorie malnutrition, severe   Dysphagia   Malnutrition of moderate degree   Esophageal carcinoma  Dysphagia, pharyngoesophageal phase:  -secondary to complete obstruction of esophagus due to cancer. -continue IVF's resuscitation -Placement open feeding jejunostomy tube by Dr. Scherrie Merritts  12/9 -electrolytes repletion as needed  Nausea and vomiting  -Secondary to  Jejunum narrowing, and kink at the insertion site,. -Patient has symptomatic improvement with conservative management as recommended by surgery (ambulation and laying on left side) , no further nausea and vomiting today .  Esophagus cancer:  -Following with oncology as an outpatient -plan is for radiation , radiation oncology follow-up appreciated. -CT surgery follow-up is appreciated.  Chronic back pain: - will use PRN pain medications and muscle relaxants   Protein-calorie malnutrition, moderate: - due to decrease PO intake and cancer. -dietician has been consulted    Code Status: Full  Family Communication:Wife at bedside   Disposition Plan: Remains inpatient   Procedures  -Placement open feeding jejunostomy tube by Dr. Scherrie Merritts 12/9  Consults   Gen. surgery Radiation oncology CT surgery Oncology  Medications  Scheduled Meds: . antiseptic oral rinse  7 mL Mouth Rinse q12n4p  . chlorhexidine  15 mL Mouth Rinse BID  . enoxaparin (LOVENOX) injection  40 mg Subcutaneous Q24H  . feeding supplement (PRO-STAT SUGAR FREE 64)  30 mL Oral BID  . pantoprazole (PROTONIX) IV  40 mg Intravenous Q24H   Continuous Infusions: . sodium chloride 75 mL/hr at 04/09/14 0242  . 0.9 % NaCl with KCl 20 mEq / L    . dexrose 5 % and 0.45 % NaCl with KCl 30 mEq/L    . feeding supplement (OSMOLITE 1.5 CAL) 1,000 mL (04/09/14 1150)  . lactated ringers  PRN Meds:.acetaminophen **OR** acetaminophen, methocarbamol (ROBAXIN)  IV, metoCLOPramide (REGLAN) injection, morphine injection, ondansetron **OR** ondansetron (ZOFRAN) IV, oxyCODONE, prochlorperazine  DVT Prophylaxis  Lovenox -  Lab Results  Component Value Date   PLT 188 04/09/2014    Antibiotics   Anti-infectives    Start     Dose/Rate Route Frequency Ordered Stop   04/06/14 1245  cefOXitin (MEFOXIN) 2 g in dextrose 5 % 50 mL IVPB     2 g100  mL/hr over 30 Minutes Intravenous On call to O.R. 04/06/14 1231 04/06/14 1305          Objective:   Filed Vitals:   04/08/14 1511 04/08/14 2024 04/09/14 0519 04/09/14 1445  BP: 156/86 156/86 137/80 138/76  Pulse: 68 77 81 79  Temp: 98.5 F (36.9 C) 99.7 F (37.6 C) 98.1 F (36.7 C) 98.2 F (36.8 C)  TempSrc: Oral Oral  Oral  Resp: 18 16 18 16   Height:      Weight:   83.915 kg (185 lb)   SpO2: 99% 97% 98% 96%    Wt Readings from Last 3 Encounters:  04/09/14 83.915 kg (185 lb)  04/05/14 82.691 kg (182 lb 4.8 oz)  04/04/14 84.505 kg (186 lb 4.8 oz)     Intake/Output Summary (Last 24 hours) at 04/09/14 1508 Last data filed at 04/09/14 1230  Gross per 24 hour  Intake   1275 ml  Output   1050 ml  Net    225 ml     Physical Exam  Awake Alert, Oriented X 3, No new F.N deficits, Normal affect [place].AT,PERRAL Supple Neck,No JVD, No cervical lymphadenopathy appriciated.  Symmetrical Chest wall movement, Good air movement bilaterally, CTAB RRR,No Gallops,Rubs or new Murmurs, No Parasternal Heave +ve B.Sounds, Abd Soft, No tenderness, No organomegaly appriciated, No rebound - guarding or rigidity.abd J tube coverd with bandage. No Cyanosis, Clubbing or edema, No new Rash or bruise     Data Review   Micro Results Recent Results (from the past 240 hour(s))  Surgical pcr screen     Status: None   Collection Time: 04/06/14 12:00 PM  Result Value Ref Range Status   MRSA, PCR NEGATIVE NEGATIVE Final   Staphylococcus aureus NEGATIVE NEGATIVE Final    Comment:        The Xpert SA Assay (FDA approved for NASAL specimens in patients over 78 years of age), is one component of a comprehensive surveillance program.  Test performance has been validated by [employer] for patients greater than or equal to 42 year old. It is not intended to diagnose infection nor to guide or monitor treatment.     Radiology Reports Ct Abdomen Pelvis W Contrast  04/09/2014   ADDENDUM  REPORT: 04/09/2014 00:24  ADDENDUM: These results were called by telephone at the time of interpretation on 04/08/2014 at 11:50 p.m. to Dr. Grandville Silos, who verbally acknowledged these results.   Electronically Signed   By: Marin Olp M.D.   On: 04/09/2014 00:24   04/09/2014   CLINICAL DATA:  Intractable vomiting. Jejunostomy tube insertion 04/06/2014. History of esophageal cancer.  EXAM: CT ABDOMEN AND PELVIS WITH CONTRAST  TECHNIQUE: Multidetector CT imaging of the abdomen and pelvis was performed using the standard protocol following bolus administration of intravenous contrast.  CONTRAST:  118mL OMNIPAQUE IOHEXOL 300 MG/ML  SOLN  COMPARISON:  None.  FINDINGS: Lung bases demonstrate minimal focal opacification over the right middle lobe adjacent the major fissure. There is circumferential wall thickening over the distal esophagus with  fluid within the distal esophagus. This may be due to patient's known esophageal cancer.  Abdominal images demonstrate evidence of patient's jejunostomy tube entering the left abdomen above the umbilicus entering the jejunum and coursing towards the right upper quadrant within a jejunal loop. There is moderate free peritoneal air likely due to patient's recent jejunostomy tube placement. There is mild gastric distention. There is alteration of the normal position of the duodenal jejunal junction as the duodenum courses directly anteriorly to the jejunal junction as the proximal jejunum makes a sharp turn from the midline towards the right abdomen as this is the point of entry of the jejunostomy tube. There is wall thickening of the jejunum at the level of the jejunostomy tube entry. There is mucosal enhancement with mild submucosal edema of the duodenum and jejunum just proximal to the entry of the jejunostomy tube. There are skin staples over the anterior midline abdominal wall.  Gallbladder is contracted. The liver, spleen, pancreas and adrenal glands are within normal. Kidneys  are normal in size with a couple subcentimeter hypodensities too small to characterize but likely cysts. There is mild calcified plaque over the abdominal aorta. Appendix is not seen.  Pelvic images demonstrate the bladder, prostate and rectum early within normal. There is mild degenerative change of the spine and hips. The  IMPRESSION: Jejunostomy tube with tip overlying the jejunum in the right upper quadrant. Moderate free peritoneal air from patient's recent jejunostomy tube placement. Note that the jejunum is kinked on itself at the entry of the jejunostomy tube just left of midline in the anterior mid to upper abdomen with minimal wall thickening. The duodenum and short segment of jejunum just proximal to the entry of the jejunostomy tube are mildly dilated with mucosal enhancement and submucosal edema along with mild gastric distention as these findings may be due to the degree of obstruction at the site of entry of the gastrostomy tube.  Subtle focal opacification over the right middle lobe which may represent atelectasis versus early infection.  Circumferential wall thickening with fluid within the distal esophagus which may be due to patient's known esophageal cancer.  A few small subcentimeter renal cortical hypodensities too small to characterize but likely cysts.  Electronically Signed: By: Marin Olp M.D. On: 04/08/2014 23:45   Dg Abd 2 Views  04/08/2014   CLINICAL DATA:  Status post J-tube placement  EXAM: ABDOMEN - 2 VIEW  COMPARISON:  None.  FINDINGS: There is a percutaneous jejunostomy to which appears to anterior from the left of midline. This crosses the midline and terminates within the right upper quadrant of the abdomen. Skin staples are identified along the midline of the abdomen. The bowel gas pattern appears normal.  IMPRESSION: 1. Jejunostomy tube positioned as above.   Electronically Signed   By: Kerby Moors M.D.   On: 04/08/2014 09:40    CBC  Recent Labs Lab  04/05/14 1348 04/07/14 0620 04/08/14 0515 04/09/14 0530  WBC 9.8 9.3 8.6 12.0*  HGB 14.5 13.3 13.7 14.5  HCT 42.5 38.9* 40.8 42.1  PLT 185 171 192 188  MCV 85.9 86.1 86.3 85.2  MCH 29.3 29.4 29.0 29.4  MCHC 34.1 34.2 33.6 34.4  RDW 12.5 12.5 12.8 12.6  LYMPHSABS 1.5  --   --   --   MONOABS 0.5  --   --   --   EOSABS 0.0  --   --   --   BASOSABS 0.0  --   --   --  Chemistries   Recent Labs Lab 04/05/14 1348 04/07/14 0620 04/08/14 0515 04/09/14 0530  NA 145 136* 139 140  K 5.1 5.0 3.8 3.4*  CL 104 100 102 102  CO2 29 26 27 24   GLUCOSE 99 140* 128* 139*  BUN 19 17 17 14   CREATININE 1.10 1.00 0.99 0.88  CALCIUM 10.3 9.6 9.2 9.3  MG 2.2  --   --   --   AST 17  --   --   --   ALT 13  --   --   --   ALKPHOS 96  --   --   --   BILITOT 1.1  --   --   --    ------------------------------------------------------------------------------------------------------------------ estimated creatinine clearance is 95.1 mL/min (by C-G formula based on Cr of 0.88). ------------------------------------------------------------------------------------------------------------------ No results for input(s): HGBA1C in the last 72 hours. ------------------------------------------------------------------------------------------------------------------ No results for input(s): CHOL, HDL, LDLCALC, TRIG, CHOLHDL, LDLDIRECT in the last 72 hours. ------------------------------------------------------------------------------------------------------------------ No results for input(s): TSH, T4TOTAL, T3FREE, THYROIDAB in the last 72 hours.  Invalid input(s): FREET3 ------------------------------------------------------------------------------------------------------------------ No results for input(s): VITAMINB12, FOLATE, FERRITIN, TIBC, IRON, RETICCTPCT in the last 72 hours.  Coagulation profile No results for input(s): INR, PROTIME in the last 168 hours.  No results for input(s): DDIMER in the  last 72 hours.  Cardiac Enzymes No results for input(s): CKMB, TROPONINI, MYOGLOBIN in the last 168 hours.  Invalid input(s): CK ------------------------------------------------------------------------------------------------------------------ Invalid input(s): POCBNP     Time Spent in minutes   25 minutes   Sabra Sessler M.D on 04/09/2014 at 3:08 PM  Between 7am to 7pm - Pager - [phone]  After 7pm go to [email or web address] - password [email or web address]  And look for the night coverage person covering for me after hours  [facility] Office  [phone]   **Disclaimer: This note may have been dictated with voice recognition software. Similar sounding words can inadvertently be transcribed and this note may contain transcription errors which may not have been corrected upon publication of note.**

## 2014-04-09 NOTE — Progress Notes (Signed)
Patient ID: Billy Romero, male   DOB: 1955-05-16, 58 y.o.   MRN: [id number]  [facility] - Progress Note  POD# 3  Subjective: Patient uncomfortable with nausea and emesis.  TF's held.  Objective: Vital signs in last 24 hours: Temp:  [98.1 F (36.7 C)-99.7 F (37.6 C)] 98.1 F (36.7 C) (12/12 0519) Pulse Rate:  [68-81] 81 (12/12 0519) Resp:  [16-18] 18 (12/12 0519) BP: (137-156)/(80-86) 137/80 mmHg (12/12 0519) SpO2:  [97 %-99 %] 98 % (12/12 0519) Weight:  [185 lb (83.915 kg)] 185 lb (83.915 kg) (12/12 0519) Last BM Date: 04/04/14  Intake/Output from previous day: 12/11 0701 - 12/12 0700 In: 1275 [I.V.:1255] Out: 1050 [Urine:850; Emesis/NG output:200]  Exam: HEENT - clear, not icteric Neck - soft Chest - clear bilaterally Cor - RRR, no murmur Abd - soft, mild tenderness; dressing dry and intact; J-tube plugged Ext - no significant edema Neuro - grossly intact, no focal deficits  Lab Results:   Recent Labs  04/08/14 0515 04/09/14 0530  WBC 8.6 12.0*  HGB 13.7 14.5  HCT 40.8 42.1  PLT 192 188     Recent Labs  04/08/14 0515 04/09/14 0530  NA 139 140  K 3.8 3.4*  CL 102 102  CO2 27 24  GLUCOSE 128* 139*  BUN 17 14  CREATININE 0.99 0.88  CALCIUM 9.2 9.3    Studies/Results: Ct Abdomen Pelvis W Contrast  04/09/2014   ADDENDUM REPORT: 04/09/2014 00:24  ADDENDUM: These results were called by telephone at the time of interpretation on 04/08/2014 at 11:50 p.m. to Dr. Grandville Silos, who verbally acknowledged these results.   Electronically Signed   By: Marin Olp M.D.   On: 04/09/2014 00:24   04/09/2014   CLINICAL DATA:  Intractable vomiting. Jejunostomy tube insertion 04/06/2014. History of esophageal cancer.  EXAM: CT ABDOMEN AND PELVIS WITH CONTRAST  TECHNIQUE: Multidetector CT imaging of the abdomen and pelvis was performed using the standard protocol following bolus administration of intravenous contrast.  CONTRAST:   179mL OMNIPAQUE IOHEXOL 300 MG/ML  SOLN  COMPARISON:  None.  FINDINGS: Lung bases demonstrate minimal focal opacification over the right middle lobe adjacent the major fissure. There is circumferential wall thickening over the distal esophagus with fluid within the distal esophagus. This may be due to patient's known esophageal cancer.  Abdominal images demonstrate evidence of patient's jejunostomy tube entering the left abdomen above the umbilicus entering the jejunum and coursing towards the right upper quadrant within a jejunal loop. There is moderate free peritoneal air likely due to patient's recent jejunostomy tube placement. There is mild gastric distention. There is alteration of the normal position of the duodenal jejunal junction as the duodenum courses directly anteriorly to the jejunal junction as the proximal jejunum makes a sharp turn from the midline towards the right abdomen as this is the point of entry of the jejunostomy tube. There is wall thickening of the jejunum at the level of the jejunostomy tube entry. There is mucosal enhancement with mild submucosal edema of the duodenum and jejunum just proximal to the entry of the jejunostomy tube. There are skin staples over the anterior midline abdominal wall.  Gallbladder is contracted. The liver, spleen, pancreas and adrenal glands are within normal. Kidneys are normal in size with a couple subcentimeter hypodensities too small to characterize but likely cysts. There is mild calcified plaque over the abdominal aorta. Appendix is not seen.  Pelvic images demonstrate the bladder, prostate and rectum early  within normal. There is mild degenerative change of the spine and hips. The  IMPRESSION: Jejunostomy tube with tip overlying the jejunum in the right upper quadrant. Moderate free peritoneal air from patient's recent jejunostomy tube placement. Note that the jejunum is kinked on itself at the entry of the jejunostomy tube just left of midline in the  anterior mid to upper abdomen with minimal wall thickening. The duodenum and short segment of jejunum just proximal to the entry of the jejunostomy tube are mildly dilated with mucosal enhancement and submucosal edema along with mild gastric distention as these findings may be due to the degree of obstruction at the site of entry of the gastrostomy tube.  Subtle focal opacification over the right middle lobe which may represent atelectasis versus early infection.  Circumferential wall thickening with fluid within the distal esophagus which may be due to patient's known esophageal cancer.  A few small subcentimeter renal cortical hypodensities too small to characterize but likely cysts.  Electronically Signed: By: Marin Olp M.D. On: 04/08/2014 23:45   Dg Abd 2 Views  04/08/2014   CLINICAL DATA:  Status post J-tube placement  EXAM: ABDOMEN - 2 VIEW  COMPARISON:  None.  FINDINGS: There is a percutaneous jejunostomy to which appears to anterior from the left of midline. This crosses the midline and terminates within the right upper quadrant of the abdomen. Skin staples are identified along the midline of the abdomen. The bowel gas pattern appears normal.  IMPRESSION: 1. Jejunostomy tube positioned as above.   Electronically Signed   By: Kerby Moors M.D.   On: 04/08/2014 09:40    Assessment / Plan: 1.  Esophageal carcinoma with obstruction 2.  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  Also, the loop of bowel containing the J-tube has shifted from the left abdomen to the RUQ, creating a "kink" at the fixed insertion site.  This is causing obstruction of the jejunum leading to duodenal and gastric distension.  Will try ambulation and having patient lay on left side.  If not symptomatically improved later today, would ask radiology to place NG under fluoro guidance with contrast to decompress stomach.  Reoperation not necessary at  this point.  Hopefully this will work itself out without further procedures.  Discussed with Dr. Johnette Abraham.  Will follow.  Earnstine Regal, MD, [facility] Office: [phone]  04/09/2014

## 2014-04-10 ENCOUNTER — Inpatient Hospital Stay (HOSPITAL_COMMUNITY): Payer: BC Managed Care – PPO

## 2014-04-10 ENCOUNTER — Encounter (HOSPITAL_COMMUNITY): Payer: Self-pay | Admitting: Radiology

## 2014-04-10 ENCOUNTER — Telehealth: Payer: Self-pay | Admitting: Oncology

## 2014-04-10 LAB — GLUCOSE, CAPILLARY
GLUCOSE-CAPILLARY: 123 mg/dL — AB (ref 70–99)
GLUCOSE-CAPILLARY: 124 mg/dL — AB (ref 70–99)
GLUCOSE-CAPILLARY: 142 mg/dL — AB (ref 70–99)
Glucose-Capillary: 144 mg/dL — ABNORMAL HIGH (ref 70–99)
Glucose-Capillary: 130 mg/dL — ABNORMAL HIGH (ref 70–99)

## 2014-04-10 LAB — COMPREHENSIVE METABOLIC PANEL
ALT: 178 U/L — ABNORMAL HIGH (ref 0–53)
AST: 74 U/L — ABNORMAL HIGH (ref 0–37)
Albumin: 3.5 g/dL (ref 3.5–5.2)
Alkaline Phosphatase: 81 U/L (ref 39–117)
Anion gap: 14 (ref 5–15)
BUN: 18 mg/dL (ref 6–23)
CALCIUM: 9.6 mg/dL (ref 8.4–10.5)
CO2: 25 meq/L (ref 19–32)
CREATININE: 0.87 mg/dL (ref 0.50–1.35)
Chloride: 102 mEq/L (ref 96–112)
GFR calc non Af Amer: >90 mL/min (ref >90)
Glucose, Bld: 154 mg/dL — ABNORMAL HIGH (ref 70–99)
Potassium: 3.2 mEq/L — ABNORMAL LOW (ref 3.7–5.3)
Sodium: 141 mEq/L (ref 137–147)
TOTAL PROTEIN: 6.5 g/dL (ref 6.0–8.3)
Total Bilirubin: 1.8 mg/dL — ABNORMAL HIGH (ref 0.3–1.2)

## 2014-04-10 LAB — TYPE AND SCREEN
ABO/RH(D): A POS
Antibody Screen: NEGATIVE

## 2014-04-10 LAB — LACTIC ACID, PLASMA: Lactic Acid, Venous: 2.3 mmol/L — ABNORMAL HIGH (ref 0.5–2.2)

## 2014-04-10 LAB — CBC
HCT: 45.5 % (ref 39.0–52.0)
Hemoglobin: 15.6 g/dL (ref 13.0–17.0)
MCH: 29.1 pg (ref 26.0–34.0)
MCHC: 34.3 g/dL (ref 30.0–36.0)
MCV: 84.9 fL (ref 78.0–100.0)
PLATELETS: 183 10*3/uL (ref 150–400)
RBC: 5.36 MIL/uL (ref 4.22–5.81)
RDW: 12.6 % (ref 11.5–15.5)
WBC: 7.4 10*3/uL (ref 4.0–10.5)

## 2014-04-10 LAB — ABO/RH: ABO/RH(D): A POS

## 2014-04-10 LAB — MRSA PCR SCREENING: MRSA by PCR: NEGATIVE

## 2014-04-10 MED ORDER — PANTOPRAZOLE SODIUM 40 MG IV SOLR
40.0000 mg | Freq: Two times a day (BID) | INTRAVENOUS | Status: DC
  Administered 2014-04-10 – 2014-04-21 (×22): 40 mg via INTRAVENOUS
  Filled 2014-04-10 (×24): qty 40

## 2014-04-10 MED ORDER — PIPERACILLIN-TAZOBACTAM 3.375 G IVPB
3.3750 g | Freq: Three times a day (TID) | INTRAVENOUS | Status: AC
  Administered 2014-04-11 – 2014-04-17 (×21): 3.375 g via INTRAVENOUS
  Filled 2014-04-10 (×22): qty 50

## 2014-04-10 MED ORDER — VANCOMYCIN HCL IN DEXTROSE 1-5 GM/200ML-% IV SOLN
1000.0000 mg | Freq: Once | INTRAVENOUS | Status: AC
  Administered 2014-04-10: 1000 mg via INTRAVENOUS

## 2014-04-10 MED ORDER — PIPERACILLIN-TAZOBACTAM 3.375 G IVPB
3.3750 g | Freq: Three times a day (TID) | INTRAVENOUS | Status: DC
  Filled 2014-04-10: qty 50

## 2014-04-10 MED ORDER — PROMETHAZINE HCL 25 MG RE SUPP: 25.0000 mg | Freq: Four times a day (QID) | RECTAL | Status: DC | PRN

## 2014-04-10 MED ORDER — SODIUM CHLORIDE 0.9 % IV BOLUS (SEPSIS)
1000.0000 mL | Freq: Once | INTRAVENOUS | Status: AC
  Administered 2014-04-10: 1000 mL via INTRAVENOUS

## 2014-04-10 MED ORDER — LORAZEPAM 2 MG/ML IJ SOLN
1.0000 mg | Freq: Once | INTRAMUSCULAR | Status: AC
  Administered 2014-04-10: 1 mg via INTRAVENOUS

## 2014-04-10 MED ORDER — VANCOMYCIN HCL IN DEXTROSE 1-5 GM/200ML-% IV SOLN
1000.0000 mg | Freq: Three times a day (TID) | INTRAVENOUS | Status: DC
  Filled 2014-04-10: qty 200

## 2014-04-10 MED ORDER — PROMETHAZINE HCL 25 MG/ML IJ SOLN
12.5000 mg | Freq: Four times a day (QID) | INTRAMUSCULAR | Status: DC | PRN
  Administered 2014-04-13 – 2014-04-20 (×12): 12.5 mg via INTRAVENOUS
  Filled 2014-04-10 (×13): qty 1

## 2014-04-10 MED ORDER — PIPERACILLIN-TAZOBACTAM 3.375 G IVPB
3.3750 g | Freq: Once | INTRAVENOUS | Status: DC
  Filled 2014-04-10: qty 50

## 2014-04-10 MED ORDER — PIPERACILLIN-TAZOBACTAM 3.375 G IVPB 30 MIN
3.3750 g | Freq: Once | INTRAVENOUS | Status: AC
  Administered 2014-04-10: 3.375 g via INTRAVENOUS
  Filled 2014-04-10: qty 50

## 2014-04-10 MED ORDER — VANCOMYCIN HCL IN DEXTROSE 1-5 GM/200ML-% IV SOLN
1000.0000 mg | Freq: Once | INTRAVENOUS | Status: DC
  Filled 2014-04-10: qty 200

## 2014-04-10 MED ORDER — POTASSIUM CHLORIDE IN NACL 20-0.9 MEQ/L-% IV SOLN
INTRAVENOUS | Status: AC
  Administered 2014-04-10 – 2014-04-11 (×2): via INTRAVENOUS
  Filled 2014-04-10 (×3): qty 1000

## 2014-04-10 MED ORDER — PROMETHAZINE HCL 25 MG PO TABS: 25.0000 mg | ORAL_TABLET | Freq: Four times a day (QID) | ORAL | Status: DC | PRN

## 2014-04-10 MED ORDER — LORAZEPAM 2 MG/ML IJ SOLN
INTRAMUSCULAR | Status: AC
  Filled 2014-04-10: qty 1

## 2014-04-10 MED ORDER — VANCOMYCIN HCL IN DEXTROSE 1-5 GM/200ML-% IV SOLN
1000.0000 mg | Freq: Three times a day (TID) | INTRAVENOUS | Status: DC
  Administered 2014-04-11 – 2014-04-12 (×5): 1000 mg via INTRAVENOUS
  Filled 2014-04-10 (×5): qty 200

## 2014-04-10 NOTE — Progress Notes (Signed)
Patient Demographics  Billy Romero, is a 58 y.o. male, DOB - 01-20-1956, TDV:[id number]  Admit date - 04/05/2014   Admitting Physician Barton Dubois, MD  Outpatient Primary MD for the patient is Lujean Amel, MD  LOS - 5   No chief complaint on file.     Admission history of present illness/brief narrative:   HPI: Billy Romero is a 58 y.o. 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 Admitted from [place] due to inability to eat or drink and dehydration. Patient reports that symptoms has been steadily progressing and worsening over the last week or two and currently having trouble with keeping down even his own saliva. Plan is for patient to be admitted for IVF's resuscitation and also placement of J tube for tube feedings and nutrition. Patient had Placement open feeding jejunostomy tube by Dr. Scherrie Merritts 12/9. He had significant nausea and vomiting after his tube feeds were started, CT abdomen with contrast showing evidence of narrowing at the insertion type, as well having a kink at the insertion site . Subjective:   Billy Romero today has, No headache, No chest pain, No abdominal pain - has worsening of his nausea and vomiting, minimal this morning, but much worse and by the afternoon.  Assessment & Plan    Principal Problem:   Dysphagia, pharyngoesophageal phase Active Problems:   Esophagus cancer   Chronic back pain   Protein-calorie malnutrition, severe   Dysphagia   Malnutrition of moderate degree   Esophageal carcinoma  Dysphagia, pharyngoesophageal phase:  -secondary to complete obstruction of esophagus due to cancer. -continue IVF's  resuscitation -Placement open feeding jejunostomy tube by Dr. Scherrie Merritts 12/9 -electrolytes repletion as needed  Nausea and vomiting  -Secondary to  Jejunum narrowing, and kink at the insertion site,. - conservative management as recommended by surgery (ambulation and laying on left side) , initial improvement of nausea and vomiting, but worsened today , so would have fluoroscopy guided NG tube insertion.  Esophagus cancer:  -Following with oncology as an outpatient -plan is for radiation , radiation oncology follow-up appreciated. -CT surgery follow-up is appreciated.  Chronic back pain: - will use PRN pain medications and muscle relaxants   Protein-calorie malnutrition, moderate: - due to decrease PO intake and cancer. -dietician has been consulted   Hypokalemia -Place, recheck in a.m.  Code Status: Full  Family Communication:Wife at bedside   Disposition Plan: Remains inpatient   Procedures  -Placement open feeding jejunostomy tube by Dr. Scherrie Merritts 12/9  Consults   Gen. surgery Radiation oncology CT surgery Oncology  Medications  Scheduled Meds: . antiseptic oral rinse  7 mL Mouth Rinse q12n4p  . chlorhexidine  15 mL Mouth Rinse BID  . enoxaparin (LOVENOX) injection  40 mg Subcutaneous Q24H  . feeding supplement (PRO-STAT SUGAR FREE 64)  30 mL Oral BID  . pantoprazole (PROTONIX) IV  40 mg Intravenous Q24H   Continuous Infusions: . 0.9 % NaCl with KCl 20 mEq / L    . feeding supplement (OSMOLITE 1.5 CAL) 1,000 mL (04/10/14 0856)  . lactated ringers  PRN Meds:.acetaminophen **OR** acetaminophen, methocarbamol (ROBAXIN)  IV, metoCLOPramide (REGLAN) injection, morphine injection, ondansetron **OR** ondansetron (ZOFRAN) IV, oxyCODONE, promethazine **OR** promethazine **OR** promethazine  DVT Prophylaxis  Lovenox -  Lab Results  Component Value Date   PLT 188 04/09/2014    Antibiotics   Anti-infectives    Start     Dose/Rate Route Frequency Ordered  Stop   04/06/14 1245  cefOXitin (MEFOXIN) 2 g in dextrose 5 % 50 mL IVPB     2 g100 mL/hr over 30 Minutes Intravenous On call to O.R. 04/06/14 1231 04/06/14 1305          Objective:   Filed Vitals:   04/09/14 1445 04/09/14 2000 04/10/14 0501 04/10/14 1417  BP: 138/76 167/98 161/94 149/82  Pulse: 79 84 86 103  Temp: 98.2 F (36.8 C) 98 F (36.7 C) 97.7 F (36.5 C) 97.9 F (36.6 C)  TempSrc: Oral Oral Oral Axillary  Resp: 16 16 16 16   Height:      Weight:   83.2 kg (183 lb 6.8 oz)   SpO2: 96% 98% 97% 95%    Wt Readings from Last 3 Encounters:  04/10/14 83.2 kg (183 lb 6.8 oz)  04/05/14 82.691 kg (182 lb 4.8 oz)  04/04/14 84.505 kg (186 lb 4.8 oz)     Intake/Output Summary (Last 24 hours) at 04/10/14 1509 Last data filed at 04/10/14 0504  Gross per 24 hour  Intake      0 ml  Output    850 ml  Net   -850 ml     Physical Exam  Awake Alert, Oriented X 3, No new F.N deficits, Normal affect [place].AT,PERRAL Supple Neck,No JVD, No cervical lymphadenopathy appriciated.  Symmetrical Chest wall movement, Good air movement bilaterally, CTAB RRR,No Gallops,Rubs or new Murmurs, No Parasternal Heave +ve B.Sounds, Abd Soft, No tenderness, No organomegaly appriciated, No rebound - guarding or rigidity.abd J tube coverd with bandage. No Cyanosis, Clubbing or edema, No new Rash or bruise     Data Review   Micro Results Recent Results (from the past 240 hour(s))  Surgical pcr screen     Status: None   Collection Time: 04/06/14 12:00 PM  Result Value Ref Range Status   MRSA, PCR NEGATIVE NEGATIVE Final   Staphylococcus aureus NEGATIVE NEGATIVE Final    Comment:        The Xpert SA Assay (FDA approved for NASAL specimens in patients over 44 years of age), is one component of a comprehensive surveillance program.  Test performance has been validated by [employer] for patients greater than or equal to 69 year old. It is not intended to diagnose infection nor to guide or  monitor treatment.     Radiology Reports Ct Abdomen Pelvis W Contrast  04/09/2014   ADDENDUM REPORT: 04/09/2014 17:03  ADDENDUM: I spoke with Dr. Waldron Labs today to discuss the patient CT findings. We talked about the malrotated position of the duodenojejunal junction with reversal of the normal SMA-SMV relationship compatible with known congenital malrotation. This malrotated segment of small bowel is inflamed just proximal to the entry of the J-tube. He states patient is doing better clinically today and under close clinical surveillance by surgery.   Electronically Signed   By: Marin Olp M.D.   On: 04/09/2014 17:03   04/09/2014   ADDENDUM REPORT: 04/09/2014 00:24  ADDENDUM: These results were called by telephone at the time of interpretation on 04/08/2014 at 11:50 p.m. to Dr. Grandville Silos, who verbally acknowledged these results.  Electronically Signed   By: Marin Olp M.D.   On: 04/09/2014 00:24   04/09/2014   CLINICAL DATA:  Intractable vomiting. Jejunostomy tube insertion 04/06/2014. History of esophageal cancer.  EXAM: CT ABDOMEN AND PELVIS WITH CONTRAST  TECHNIQUE: Multidetector CT imaging of the abdomen and pelvis was performed using the standard protocol following bolus administration of intravenous contrast.  CONTRAST:  175mL OMNIPAQUE IOHEXOL 300 MG/ML  SOLN  COMPARISON:  None.  FINDINGS: Lung bases demonstrate minimal focal opacification over the right middle lobe adjacent the major fissure. There is circumferential wall thickening over the distal esophagus with fluid within the distal esophagus. This may be due to patient's known esophageal cancer.  Abdominal images demonstrate evidence of patient's jejunostomy tube entering the left abdomen above the umbilicus entering the jejunum and coursing towards the right upper quadrant within a jejunal loop. There is moderate free peritoneal air likely due to patient's recent jejunostomy tube placement. There is mild gastric distention. There is  alteration of the normal position of the duodenal jejunal junction as the duodenum courses directly anteriorly to the jejunal junction as the proximal jejunum makes a sharp turn from the midline towards the right abdomen as this is the point of entry of the jejunostomy tube. There is wall thickening of the jejunum at the level of the jejunostomy tube entry. There is mucosal enhancement with mild submucosal edema of the duodenum and jejunum just proximal to the entry of the jejunostomy tube. There are skin staples over the anterior midline abdominal wall.  Gallbladder is contracted. The liver, spleen, pancreas and adrenal glands are within normal. Kidneys are normal in size with a couple subcentimeter hypodensities too small to characterize but likely cysts. There is mild calcified plaque over the abdominal aorta. Appendix is not seen.  Pelvic images demonstrate the bladder, prostate and rectum early within normal. There is mild degenerative change of the spine and hips. The  IMPRESSION: Jejunostomy tube with tip overlying the jejunum in the right upper quadrant. Moderate free peritoneal air from patient's recent jejunostomy tube placement. Note that the jejunum is kinked on itself at the entry of the jejunostomy tube just left of midline in the anterior mid to upper abdomen with minimal wall thickening. The duodenum and short segment of jejunum just proximal to the entry of the jejunostomy tube are mildly dilated with mucosal enhancement and submucosal edema along with mild gastric distention as these findings may be due to the degree of obstruction at the site of entry of the gastrostomy tube.  Subtle focal opacification over the right middle lobe which may represent atelectasis versus early infection.  Circumferential wall thickening with fluid within the distal esophagus which may be due to patient's known esophageal cancer.  A few small subcentimeter renal cortical hypodensities too small to characterize but  likely cysts.  Electronically Signed: By: Marin Olp M.D. On: 04/08/2014 23:45    CBC  Recent Labs Lab 04/05/14 1348 04/07/14 0620 04/08/14 0515 04/09/14 0530  WBC 9.8 9.3 8.6 12.0*  HGB 14.5 13.3 13.7 14.5  HCT 42.5 38.9* 40.8 42.1  PLT 185 171 192 188  MCV 85.9 86.1 86.3 85.2  MCH 29.3 29.4 29.0 29.4  MCHC 34.1 34.2 33.6 34.4  RDW 12.5 12.5 12.8 12.6  LYMPHSABS 1.5  --   --   --   MONOABS 0.5  --   --   --   EOSABS 0.0  --   --   --   BASOSABS 0.0  --   --   --  Chemistries   Recent Labs Lab 04/05/14 1348 04/07/14 0620 04/08/14 0515 04/09/14 0530  NA 145 136* 139 140  K 5.1 5.0 3.8 3.4*  CL 104 100 102 102  CO2 29 26 27 24   GLUCOSE 99 140* 128* 139*  BUN 19 17 17 14   CREATININE 1.10 1.00 0.99 0.88  CALCIUM 10.3 9.6 9.2 9.3  MG 2.2  --   --   --   AST 17  --   --   --   ALT 13  --   --   --   ALKPHOS 96  --   --   --   BILITOT 1.1  --   --   --    ------------------------------------------------------------------------------------------------------------------ estimated creatinine clearance is 95.1 mL/min (by C-G formula based on Cr of 0.88). ------------------------------------------------------------------------------------------------------------------ No results for input(s): HGBA1C in the last 72 hours. ------------------------------------------------------------------------------------------------------------------ No results for input(s): CHOL, HDL, LDLCALC, TRIG, CHOLHDL, LDLDIRECT in the last 72 hours. ------------------------------------------------------------------------------------------------------------------ No results for input(s): TSH, T4TOTAL, T3FREE, THYROIDAB in the last 72 hours.  Invalid input(s): FREET3 ------------------------------------------------------------------------------------------------------------------ No results for input(s): VITAMINB12, FOLATE, FERRITIN, TIBC, IRON, RETICCTPCT in the last 72 hours.  Coagulation  profile No results for input(s): INR, PROTIME in the last 168 hours.  No results for input(s): DDIMER in the last 72 hours.  Cardiac Enzymes No results for input(s): CKMB, TROPONINI, MYOGLOBIN in the last 168 hours.  Invalid input(s): CK ------------------------------------------------------------------------------------------------------------------ Invalid input(s): POCBNP     Time Spent in minutes   25 minutes   Donte Kary M.D on 04/10/2014 at 3:09 PM  Between 7am to 7pm - Pager - [phone]  After 7pm go to [email or web address] - password [email or web address]  And look for the night coverage person covering for me after hours  [facility] Office  [phone]   **Disclaimer: This note may have been dictated with voice recognition software. Similar sounding words can inadvertently be transcribed and this note may contain transcription errors which may not have been corrected upon publication of note.**

## 2014-04-10 NOTE — Progress Notes (Signed)
Patient ID: Billy Romero, male   DOB: July 04, 1955, 58 y.o.   MRN: [id number]  General Surgery - [facility] - Progress Note  Subjective: Patient with less nausea, pain this morning.  TF's at 20cc per hour.  Objective: Vital signs in last 24 hours: Temp:  [97.7 F (36.5 C)-98.2 F (36.8 C)] 97.7 F (36.5 C) (12/13 0501) Pulse Rate:  [79-86] 86 (12/13 0501) Resp:  [16] 16 (12/13 0501) BP: (138-167)/(76-98) 161/94 mmHg (12/13 0501) SpO2:  [96 %-98 %] 97 % (12/13 0501) Weight:  [183 lb 6.8 oz (83.2 kg)] 183 lb 6.8 oz (83.2 kg) (12/13 0501) Last BM Date: 04/04/14  Intake/Output from previous day: 12/12 0701 - 12/13 0700 In: 0  Out: 1400 [Urine:1400]  Exam: HEENT - clear, not icteric Neck - soft Chest - clear bilaterally Cor - RRR, no murmur Abd - soft without distension; few BS present; midline wound clear and dry and intact Ext - no significant edema Neuro - grossly intact, no focal deficits  Lab Results:   Recent Labs  04/08/14 0515 04/09/14 0530  WBC 8.6 12.0*  HGB 13.7 14.5  HCT 40.8 42.1  PLT 192 188     Recent Labs  04/08/14 0515 04/09/14 0530  NA 139 140  K 3.8 3.4*  CL 102 102  CO2 27 24  GLUCOSE 128* 139*  BUN 17 14  CREATININE 0.99 0.88  CALCIUM 9.2 9.3    Studies/Results: Ct Abdomen Pelvis W Contrast  04/09/2014   ADDENDUM REPORT: 04/09/2014 17:03  ADDENDUM: I spoke with Dr. Waldron Labs today to discuss the patient CT findings. We talked about the malrotated position of the duodenojejunal junction with reversal of the normal SMA-SMV relationship compatible with known congenital malrotation. This malrotated segment of small bowel is inflamed just proximal to the entry of the J-tube. He states patient is doing better clinically today and under close clinical surveillance by surgery.   Electronically Signed   By: Marin Olp M.D.   On: 04/09/2014 17:03   04/09/2014   ADDENDUM REPORT: 04/09/2014 00:24  ADDENDUM: These results were  called by telephone at the time of interpretation on 04/08/2014 at 11:50 p.m. to Dr. Grandville Silos, who verbally acknowledged these results.   Electronically Signed   By: Marin Olp M.D.   On: 04/09/2014 00:24   04/09/2014   CLINICAL DATA:  Intractable vomiting. Jejunostomy tube insertion 04/06/2014. History of esophageal cancer.  EXAM: CT ABDOMEN AND PELVIS WITH CONTRAST  TECHNIQUE: Multidetector CT imaging of the abdomen and pelvis was performed using the standard protocol following bolus administration of intravenous contrast.  CONTRAST:  162mL OMNIPAQUE IOHEXOL 300 MG/ML  SOLN  COMPARISON:  None.  FINDINGS: Lung bases demonstrate minimal focal opacification over the right middle lobe adjacent the major fissure. There is circumferential wall thickening over the distal esophagus with fluid within the distal esophagus. This may be due to patient's known esophageal cancer.  Abdominal images demonstrate evidence of patient's jejunostomy tube entering the left abdomen above the umbilicus entering the jejunum and coursing towards the right upper quadrant within a jejunal loop. There is moderate free peritoneal air likely due to patient's recent jejunostomy tube placement. There is mild gastric distention. There is alteration of the normal position of the duodenal jejunal junction as the duodenum courses directly anteriorly to the jejunal junction as the proximal jejunum makes a sharp turn from the midline towards the right abdomen as this is the point of entry of the jejunostomy tube. There is  wall thickening of the jejunum at the level of the jejunostomy tube entry. There is mucosal enhancement with mild submucosal edema of the duodenum and jejunum just proximal to the entry of the jejunostomy tube. There are skin staples over the anterior midline abdominal wall.  Gallbladder is contracted. The liver, spleen, pancreas and adrenal glands are within normal. Kidneys are normal in size with a couple subcentimeter  hypodensities too small to characterize but likely cysts. There is mild calcified plaque over the abdominal aorta. Appendix is not seen.  Pelvic images demonstrate the bladder, prostate and rectum early within normal. There is mild degenerative change of the spine and hips. The  IMPRESSION: Jejunostomy tube with tip overlying the jejunum in the right upper quadrant. Moderate free peritoneal air from patient's recent jejunostomy tube placement. Note that the jejunum is kinked on itself at the entry of the jejunostomy tube just left of midline in the anterior mid to upper abdomen with minimal wall thickening. The duodenum and short segment of jejunum just proximal to the entry of the jejunostomy tube are mildly dilated with mucosal enhancement and submucosal edema along with mild gastric distention as these findings may be due to the degree of obstruction at the site of entry of the gastrostomy tube.  Subtle focal opacification over the right middle lobe which may represent atelectasis versus early infection.  Circumferential wall thickening with fluid within the distal esophagus which may be due to patient's known esophageal cancer.  A few small subcentimeter renal cortical hypodensities too small to characterize but likely cysts.  Electronically Signed: By: Marin Olp M.D. On: 04/08/2014 23:45   Dg Abd 2 Views  04/08/2014   CLINICAL DATA:  Status post J-tube placement  EXAM: ABDOMEN - 2 VIEW  COMPARISON:  None.  FINDINGS: There is a percutaneous jejunostomy to which appears to anterior from the left of midline. This crosses the midline and terminates within the right upper quadrant of the abdomen. Skin staples are identified along the midline of the abdomen. The bowel gas pattern appears normal.  IMPRESSION: 1. Jejunostomy tube positioned as above.   Electronically Signed   By: Kerby Moors M.D.   On: 04/08/2014 09:40    Assessment / Plan: 1.  Status post J-tube placement  May increase TF's to goal  rate per dietary  Encourage ambulation, OOB, lying on left side  CTA shows partial malrotation - causes mechanical disadvantage for left sided placement of J-tube  Partial obstruction from J-tube should resolve as edema at insertion site improves and patient becomes more mobile  Will follow - if nausea controlled and TF's tolerated, may be able to go home today or tomorrow  Earnstine Regal, MD, [facility] Office: [phone]  04/10/2014

## 2014-04-10 NOTE — Progress Notes (Addendum)
Patient continued to have nausea and vomiting today, so patient had NG tube inserted under fluoroscopic guidance by radiology, patient had 2 canisters (total of 2400 mL drained right away and radiology department from NG tube), and another canister (1200 mL) drained from NG tube during the first 30 minutes on the floor ( total of 3600 mL). Patient started to have chills, febrile 101.9, sinus tachycardia 140's, hypoxic requiring home oxygen 2 L nasal cannula, 2 hours after procedures on the floor, will check CBC, CMP, lactic acid, type and screen, blood cultures, urinalysis, ordered CT chest without contrast to rule out any perforation or aspiration,, started empirically on IV vancomycin and Zosyn, will transfer patient to stepdown.  Phillips Climes MD [phone]

## 2014-04-10 NOTE — Progress Notes (Signed)
Patient transfeerd to [facility] unit, report given to [employer]. Patient stable at transfer.

## 2014-04-10 NOTE — Progress Notes (Signed)
Patient had NGT placed in IR this afternoon. Patient started to have chills, fever and increased heart rate. Dr Waldron Labs notified and new orders given. Will continue to monitor.

## 2014-04-10 NOTE — Progress Notes (Signed)
ANTIBIOTIC CONSULT NOTE - INITIAL  Pharmacy Consult for Vancomycin and Zosyn Indication: rule out sepsis  No Known Allergies  Patient Measurements: Height: 5\' 11"  (180.3 cm) Weight: 183 lb 6.8 oz (83.2 kg) IBW/kg (Calculated) : 75.3  Vital Signs: Temp: 99.6 F (37.6 C) (12/13 1727) Temp Source: Axillary (12/13 1727) BP: 147/93 mmHg (12/13 1810) Pulse Rate: 139 (12/13 1810) Intake/Output from previous day: 12/12 0701 - 12/13 0700 In: 0  Out: 1400 [Urine:1400] Intake/Output from this shift:    Labs:  Recent Labs  04/08/14 0515 04/09/14 0530  WBC 8.6 12.0*  HGB 13.7 14.5  PLT 192 188  CREATININE 0.99 0.88   Estimated Creatinine Clearance: 95.1 mL/min (by C-G formula based on Cr of 0.88). No results for input(s): VANCOTROUGH, VANCOPEAK, VANCORANDOM, GENTTROUGH, GENTPEAK, GENTRANDOM, TOBRATROUGH, TOBRAPEAK, TOBRARND, AMIKACINPEAK, AMIKACINTROU, AMIKACIN in the last 72 hours.   Microbiology: Recent Results (from the past 720 hour(s))  Surgical pcr screen     Status: None   Collection Time: 04/06/14 12:00 PM  Result Value Ref Range Status   MRSA, PCR NEGATIVE NEGATIVE Final   Staphylococcus aureus NEGATIVE NEGATIVE Final    Comment:        The Xpert SA Assay (FDA approved for NASAL specimens in patients over 67 years of age), is one component of a comprehensive surveillance program.  Test performance has been validated by [employer] for patients greater than or equal to 26 year old. It is not intended to diagnose infection nor to guide or monitor treatment.     Medical History: Past Medical History  Diagnosis Date  . Esophageal cancer 03/29/14    Medications:  Scheduled:  . antiseptic oral rinse  7 mL Mouth Rinse q12n4p  . chlorhexidine  15 mL Mouth Rinse BID  . enoxaparin (LOVENOX) injection  40 mg Subcutaneous Q24H  . feeding supplement (PRO-STAT SUGAR FREE 64)  30 mL Oral BID  . LORazepam      . pantoprazole (PROTONIX) IV  40 mg Intravenous Q12H   . piperacillin-tazobactam (ZOSYN)  IV  3.375 g Intravenous Once  . sodium chloride  1,000 mL Intravenous Once  . vancomycin  1,000 mg Intravenous Once   Infusions:  . 0.9 % NaCl with KCl 20 mEq / L 75 mL/hr at 04/10/14 1600  . feeding supplement (OSMOLITE 1.5 CAL) 1,000 mL (04/10/14 0856)  . lactated ringers     PRN: acetaminophen **OR** acetaminophen, methocarbamol (ROBAXIN)  IV, metoCLOPramide (REGLAN) injection, morphine injection, ondansetron **OR** ondansetron (ZOFRAN) IV, oxyCODONE, promethazine **OR** promethazine **OR** promethazine  Assessment: 58 yo male with hx alcohol abuse (sober for last 9 years), chronic back and neck pain and esophageal cancer with complete esophagus obstruction causing dysphagia. Admitted from [facility] on 12/8 due to inability to eat or drink and dehydration. He is s/p open feeding jejunostomy tube on 12/9 and developed significant n/v after TF started. NGT inserted in IR 12/13 with immediate output. Pt developed fever, chills, tachycardia and hypoxia. CT ordered to r/o perforation and pharmacy consulted to dose vancomycin and zosyn for sepsis.  12/13 >> Vancomycin >> 12/13 >> Zosyn >>  Tmax: 101.9 WBC: 12k Renal: SCr 0.88, CrCl 95CG, 90N  12/13 blood: 12/13 urine:  Goal of Therapy:  Vancomycin trough level 15-20 mcg/ml  Zosyn dosage per indication, renal function  Plan:  Zosyn 3.375gm IV q8h (4hr extended infusions) Vancomycin 1g IV q8h Check trough at steady state Follow up renal function & cultures, clinical course  Peggyann Juba, PharmD, BCPS Pager: [phone] 04/10/2014,7:07  PM

## 2014-04-10 NOTE — Telephone Encounter (Signed)
lvm for pt with all appts....including chemo edu.Marland KitchenMarland KitchenMarland Kitchen

## 2014-04-11 ENCOUNTER — Inpatient Hospital Stay (HOSPITAL_COMMUNITY): Payer: BC Managed Care – PPO

## 2014-04-11 ENCOUNTER — Encounter: Payer: BC Managed Care – PPO | Admitting: Cardiothoracic Surgery

## 2014-04-11 ENCOUNTER — Ambulatory Visit: Payer: 59 | Admitting: Radiation Oncology

## 2014-04-11 LAB — URINE MICROSCOPIC-ADD ON

## 2014-04-11 LAB — BASIC METABOLIC PANEL
ANION GAP: 12 (ref 5–15)
BUN: 18 mg/dL (ref 6–23)
CALCIUM: 9 mg/dL (ref 8.4–10.5)
CHLORIDE: 104 meq/L (ref 96–112)
CO2: 24 mEq/L (ref 19–32)
CREATININE: 0.87 mg/dL (ref 0.50–1.35)
GFR calc Af Amer: >90 mL/min (ref >90)
GFR calc non Af Amer: >90 mL/min (ref >90)
Glucose, Bld: 144 mg/dL — ABNORMAL HIGH (ref 70–99)
Potassium: 3.3 mEq/L — ABNORMAL LOW (ref 3.7–5.3)
Sodium: 140 mEq/L (ref 137–147)

## 2014-04-11 LAB — CBC
HEMATOCRIT: 41.8 % (ref 39.0–52.0)
Hemoglobin: 14.5 g/dL (ref 13.0–17.0)
MCH: 29.4 pg (ref 26.0–34.0)
MCHC: 34.7 g/dL (ref 30.0–36.0)
MCV: 84.6 fL (ref 78.0–100.0)
PLATELETS: 163 10*3/uL (ref 150–400)
RBC: 4.94 MIL/uL (ref 4.22–5.81)
RDW: 12.8 % (ref 11.5–15.5)
WBC: 11.4 10*3/uL — ABNORMAL HIGH (ref 4.0–10.5)

## 2014-04-11 LAB — GLUCOSE, CAPILLARY
GLUCOSE-CAPILLARY: 141 mg/dL — AB (ref 70–99)
GLUCOSE-CAPILLARY: 121 mg/dL — AB (ref 70–99)
Glucose-Capillary: 124 mg/dL — ABNORMAL HIGH (ref 70–99)
Glucose-Capillary: 104 mg/dL — ABNORMAL HIGH (ref 70–99)

## 2014-04-11 LAB — URINALYSIS, ROUTINE W REFLEX MICROSCOPIC
Glucose, UA: 100 mg/dL — AB
HGB URINE DIPSTICK: NEGATIVE
Ketones, ur: NEGATIVE mg/dL
Nitrite: NEGATIVE
PH: 6 (ref 5.0–8.0)
Protein, ur: 30 mg/dL — AB
Specific Gravity, Urine: 1.039 — ABNORMAL HIGH (ref 1.005–1.030)
Urobilinogen, UA: 1 mg/dL (ref 0.0–1.0)

## 2014-04-11 LAB — LACTIC ACID, PLASMA: LACTIC ACID, VENOUS: 1.3 mmol/L (ref 0.5–2.2)

## 2014-04-11 MED ORDER — SODIUM CHLORIDE 0.9 % IV SOLN
INTRAVENOUS | Status: DC
  Administered 2014-04-11: 11:00:00 via INTRAVENOUS

## 2014-04-11 MED ORDER — IOHEXOL 300 MG/ML  SOLN: 600.0000 mL | Freq: Once | INTRAMUSCULAR | Status: AC | PRN

## 2014-04-11 MED ORDER — OSMOLITE 1.5 CAL PO LIQD
1000.0000 mL | ORAL | Status: DC
  Filled 2014-04-11 (×2): qty 1000

## 2014-04-11 NOTE — Progress Notes (Addendum)
5 Days Post-Op  Subjective: Events noted.  No abdominal pain.  No BM or flatus.    Objective: Vital signs in last 24 hours: Temp:  [97.9 F (36.6 C)-101 F (38.3 C)] 98.3 F (36.8 C) (12/14 0400) Pulse Rate:  [102-139] 102 (12/14 0600) Resp:  [16-29] 23 (12/14 0600) BP: (101-154)/(70-93) 113/79 mmHg (12/14 0600) SpO2:  [89 %-97 %] 92 % (12/14 0600) Weight:  [175 lb 11.3 oz (79.7 kg)] 175 lb 11.3 oz (79.7 kg) (12/14 0400) Last BM Date: 04/07/14  Intake/Output from previous day: 12/13 0701 - 12/14 0700 In: 1166.3 [I.V.:716.3; IV Piggyback:450] Out: 5300 [Urine:600; Emesis/NG output:4700] Intake/Output this shift:    PE: General- In NAD Abdomen-soft, flat, not tender, few bowel sounds, incision clean and intact  Lab Results:   Recent Labs  04/10/14 1834 04/11/14 0330  WBC 7.4 11.4*  HGB 15.6 14.5  HCT 45.5 41.8  PLT 183 163   BMET  Recent Labs  04/10/14 1834 04/11/14 0330  NA 141 140  K 3.2* 3.3*  CL 102 104  CO2 25 24  GLUCOSE 154* 144*  BUN 18 18  CREATININE 0.87 0.87  CALCIUM 9.6 9.0   PT/INR No results for input(s): LABPROT, INR in the last 72 hours. Comprehensive Metabolic Panel:    Component Value Date/Time   NA 140 04/11/2014 0330   NA 141 04/10/2014 1834   K 3.3* 04/11/2014 0330   K 3.2* 04/10/2014 1834   CL 104 04/11/2014 0330   CL 102 04/10/2014 1834   CO2 24 04/11/2014 0330   CO2 25 04/10/2014 1834   BUN 18 04/11/2014 0330   BUN 18 04/10/2014 1834   CREATININE 0.87 04/11/2014 0330   CREATININE 0.87 04/10/2014 1834   GLUCOSE 144* 04/11/2014 0330   GLUCOSE 154* 04/10/2014 1834   CALCIUM 9.0 04/11/2014 0330   CALCIUM 9.6 04/10/2014 1834   AST 74* 04/10/2014 1834   AST 17 04/05/2014 1348   ALT 178* 04/10/2014 1834   ALT 13 04/05/2014 1348   ALKPHOS 81 04/10/2014 1834   ALKPHOS 96 04/05/2014 1348   BILITOT 1.8* 04/10/2014 1834   BILITOT 1.1 04/05/2014 1348   PROT 6.5 04/10/2014 1834   PROT 7.5 04/05/2014 1348   ALBUMIN 3.5  04/10/2014 1834   ALBUMIN 4.3 04/05/2014 1348     Studies/Results: Ct Chest Wo Contrast  04/10/2014   CLINICAL DATA:  Fever and chills following nasogastric catheter placement  EXAM: CT CHEST WITHOUT CONTRAST  TECHNIQUE: Multidetector CT imaging of the chest was performed following the standard protocol without IV contrast.  COMPARISON:  CT of the abdomen and pelvis from 04/08/2014  FINDINGS: The lungs are well aerated bilaterally and demonstrate diffuse infiltrative change particularly in the posterior aspect of the right upper lobe as well as the lower lobes bilaterally. Scattered sub solid nodular changes are noted associated with the infiltrate. Considerable fluid is noted within the esophagus consistent with the clinical history of esophageal carcinoma raising suspicion for possible aspiration as a cause for the infiltrate.  Nasogastric catheter is noted coiled within the stomach. No air is noted surrounding the esophagus to suggest perforation. Persistent free air is noted within the abdomen related to the recent jejunostomy placement. This is stable from the prior CT examination.  IMPRESSION: New bilateral infiltrative changes as described. The changes in the esophagus raises suspicion for possible aspiration pneumonia.  Persistent free air within the abdomen stable from the previous exam.   Electronically Signed   By: Linus Mako.D.  On: 04/10/2014 19:21    Anti-infectives: Anti-infectives    Start     Dose/Rate Route Frequency Ordered Stop   04/11/14 0600  piperacillin-tazobactam (ZOSYN) IVPB 3.375 g     3.375 g12.5 mL/hr over 240 Minutes Intravenous Every 8 hours 04/10/14 2111     04/11/14 0600  vancomycin (VANCOCIN) IVPB 1000 mg/200 mL premix     1,000 mg200 mL/hr over 60 Minutes Intravenous Every 8 hours 04/10/14 2111     04/11/14 0400  piperacillin-tazobactam (ZOSYN) IVPB 3.375 g  Status:  Discontinued     3.375 g12.5 mL/hr over 240 Minutes Intravenous Every 8 hours 04/10/14 1910  04/10/14 2110   04/11/14 0400  vancomycin (VANCOCIN) IVPB 1000 mg/200 mL premix  Status:  Discontinued     1,000 mg200 mL/hr over 60 Minutes Intravenous Every 8 hours 04/10/14 1910 04/10/14 2110   04/10/14 2130  piperacillin-tazobactam (ZOSYN) IVPB 3.375 g     3.375 g100 mL/hr over 30 Minutes Intravenous  Once 04/10/14 2111 04/10/14 2155   04/10/14 2130  vancomycin (VANCOCIN) IVPB 1000 mg/200 mL premix     1,000 mg200 mL/hr over 60 Minutes Intravenous  Once 04/10/14 2111 04/10/14 2225   04/10/14 1800  vancomycin (VANCOCIN) IVPB 1000 mg/200 mL premix  Status:  Discontinued     1,000 mg200 mL/hr over 60 Minutes Intravenous  Once 04/10/14 1741 04/10/14 2110   04/10/14 1800  piperacillin-tazobactam (ZOSYN) IVPB 3.375 g  Status:  Discontinued     3.375 g12.5 mL/hr over 240 Minutes Intravenous  Once 04/10/14 1741 04/10/14 2110   04/06/14 1245  cefOXitin (MEFOXIN) 2 g in dextrose 5 % 50 mL IVPB     2 g100 mL/hr over 30 Minutes Intravenous On call to O.R. 04/06/14 1231 04/06/14 1305      Assessment Obstructing esophageal cancer s/p open jejunostomy tube placement 04/06/14-ng inserted with high output concerning for sbo from j-tube partially obstructing lumen vs ileus.  CT of chest suggests aspiration pneumonia and abxs started.   LOS: 6 days   Plan: Check abdominal x-ray and gastrograffin UGI to see if completely obstructed at j-tube site.   Emran Molzahn J 04/11/2014

## 2014-04-11 NOTE — Progress Notes (Signed)
Dr. Waldron Labs called back shortly after initial page about J-Tube not flushing. MD instructed RN to flush tube with Coca-Cola to help dissolve and move the blockage. RN did this with no resolution of blockage. MD paged to update. Told to try once again with Coca-Cola and see if blockage moves. Pt is stable. Will continue to monitor.

## 2014-04-11 NOTE — Progress Notes (Addendum)
NUTRITION FOLLOW-UP  INTERVENTION: - Once medically able, recommend re-initiation Osmolite 1.5 @ 20 ml/hr via J tube and increase by 10 ml every 4 hours to goal rate of 55 ml/hr. -30 ml Prostat BID.   -Tube feeding regimen provides 2180 kcal (100% of needs), 113 grams of protein, and 1005 ml of H2O.  -If IVF discontinued, will require additional 995 ml free water via flushes (250 ml QID) -RD to continue to monitor  NUTRITION DIAGNOSIS: Inadequate oral intake related to esophageal cancer as evidenced by need for nutrition support, ongoing.  Goal: Pt to meet >/= 90% of their estimated nutrition needs, will meet with TF   Monitor:  TF regimen & tolerance, weight, labs, I/O's  ASSESSMENT: 58 y.o. Admitted with progressive dysphagia. He is now unable to tolerate any liquids. PMHX of Esophagus cancer, squamous cell carcinoma, Weight loss, and ulcerative colitis.  12/8 -Pt reports not being able to drink any liquids, states that all liquids come back up. This has been occuring for the past day. Prior to this, pt was able to drink liquids and consume pureed foods with intermittent tolerance some days. -Pt states that he has lost 15-20 lb x 3-4 months. Pt with 28 lb weight loss, 13% weight loss x 3-4 months, significant for time frame.  -Pt followed by [facility], last seen 12/7. Pt is scheduled to begin radiation treatments next week. -RD to follow-up with patient for more education regarding TF regimen when wife is present. Will discuss home feeding regimen before discharge. -Nutrition focused physical exam shows no sign of depletion of muscle mass or body fat.  12/10 -J-tube placed 12/9 -RD consulted for TF initiation and management -Pt reports tolerating TF so far, not yet at goal. No issues. Pt feels much better. -Pt and wife with many questions regarding TF regimen, transition to home feedings and how the pump works. -RD to review home TF regimen on 12/11 before discharge, Pt's  wife would like to be present during education.  12/11: - Pt's TF held due to pt experiencing nausea and vomiting. Per MD note, TF to be re-started at a lower rate. No indication of ileus or blockage.  - Spoke with pt's wife regarding home TF regimen as RD not available tomorrow. Questions were answered. RD to follow-up with pt and wife on Sunday.   Details for Home Feedings Feedings per J-tube TF: Osmolite 1.5 @ goal rate of 110 ml/hr over 12 hours (tentatively 8pm-8am).  30 ml Prostat BID Free water: 60 ml before and after each continuous feeding. Patient will need an additional 875 ml per day (~8 oz QID).  Above to provide: 2180 kcal, 113g protein, and 2000 ml free water  Labs reviewed: Mg/Phos/K WNL Low Na Glucose 140  12/14: -MD noted pt's Jtube shifted to RUQ, resulting in a "kink" and duodenal and gastric distension -NG tube place for suction on 12/13, initially with 2400 ml output. Improving today with 1200 ml output -Osmolite 1.5 infusing at 25 ml/hr on 12/13. Held d/t high output. Per discussion with RN, Jtube unable to be flushed this am.  -Pt currently in radiology, RN reported plan to utilize carbonated beverage to assist in unclogging Jtube, and plan to restart Osmolite 1.5 if clog dissipates -Continue with previous recommendations for advancement rate.  -CBG elevated but within goal of < 150 mg/dL -K low, receiving IVF replacement -Wt decreased 7 lb since admit, likely r/t to pt with -4L fluid balance, and enteral nutrition intolerance  Height: Ht  Readings from Last 1 Encounters:  04/10/14 5' 11"  (1.803 m)    Weight: Wt Readings from Last 1 Encounters:  04/11/14 175 lb 11.3 oz (79.7 kg)  04/08/14 185 lb 04/05/14 182 lb  BMI:  Body mass index is 24.52 kg/(m^2).  Estimated Nutritional Needs: Kcal: 2000-2200 Protein: 105-115g Fluid: 2L/day  Skin: intact  Diet Order: Diet NPO time specified Except for: Ice Chips  EDUCATION NEEDS: -Education needs  met   Intake/Output Summary (Last 24 hours) at 04/11/14 1021 Last data filed at 04/11/14 0600  Gross per 24 hour  Intake 1166.25 ml  Output   5300 ml  Net -4133.75 ml    Last BM: 12/11  Labs:   Recent Labs Lab 04/05/14 1348  04/09/14 0530 04/10/14 1834 04/11/14 0330  NA 145  < > 140 141 140  K 5.1  < > 3.4* 3.2* 3.3*  CL 104  < > 102 102 104  CO2 29  < > 24 25 24   BUN 19  < > 14 18 18   CREATININE 1.10  < > 0.88 0.87 0.87  CALCIUM 10.3  < > 9.3 9.6 9.0  MG 2.2  --   --   --   --   PHOS 3.4  --   --   --   --   GLUCOSE 99  < > 139* 154* 144*  < > = values in this interval not displayed.  CBG (last 3)   Recent Labs  04/10/14 2146 04/11/14 0448 04/11/14 0739  GLUCAP 142* 141* 124*    Scheduled Meds: . antiseptic oral rinse  7 mL Mouth Rinse q12n4p  . chlorhexidine  15 mL Mouth Rinse BID  . enoxaparin (LOVENOX) injection  40 mg Subcutaneous Q24H  . feeding supplement (PRO-STAT SUGAR FREE 64)  30 mL Oral BID  . pantoprazole (PROTONIX) IV  40 mg Intravenous Q12H  . piperacillin-tazobactam (ZOSYN)  IV  3.375 g Intravenous Q8H  . vancomycin  1,000 mg Intravenous Q8H    Continuous Infusions: . sodium chloride    . 0.9 % NaCl with KCl 20 mEq / L 75 mL/hr at 04/11/14 0920  . feeding supplement (OSMOLITE 1.5 CAL)      Laurette Schimke MS, RD, LDN

## 2014-04-11 NOTE — Progress Notes (Signed)
Patient Demographics  Billy Romero, is a 58 y.o. male, DOB - 1956-02-13, TZG:[id number]  Admit date - 04/05/2014   Admitting Physician Barton Dubois, MD  Outpatient Primary MD for the patient is Lujean Amel, MD  LOS - 6   No chief complaint on file.     Admission history of present illness/brief narrative:   HPI: Billy Romero is a 58 y.o. 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 Admitted from [facility] due to inability to eat or drink and dehydration. Patient reports that symptoms has been steadily progressing and worsening over the last week or two and currently having trouble with keeping down even his own saliva. Plan is for patient to be admitted for IVF's resuscitation and also placement of J tube for tube feedings and nutrition. Patient had Placement open feeding jejunostomy tube by Dr. Scherrie Merritts 12/9. He had significant nausea and vomiting after his tube feeds were started, CT abdomen with contrast showing evidence of narrowing at the insertion type, as well having a kink at the insertion site . Given patient continuous nausea and vomiting, he had nasogastric tube inserted via fluoroscopic guidance by radiology, patient had 3600 mL output within half an hour from tube insertion, patient developed fever, hypoxia, tachycardia, CT chest showing evidence of bilateral aspiration pneumonia.  Subjective:   Billy Romero today has, No headache, No chest pain, No abdominal pain reports significant improvement of nausea and vomiting after NG tube insertion, had fever, cough , hypoxia yesterday . Assessment & Plan    Principal Problem:   Dysphagia, pharyngoesophageal  phase Active Problems:   Esophagus cancer   Chronic back pain   Protein-calorie malnutrition, severe   Dysphagia   Malnutrition of moderate degree   Esophageal carcinoma  Dysphagia, pharyngoesophageal phase:  -secondary to complete obstruction of esophagus due to cancer. -continue IVF's resuscitation -Placement open feeding jejunostomy tube by Dr. Scherrie Merritts 12/9 -electrolytes repletion as needed  Nausea and vomiting / Small bowel obstruction at J-tube insertion site. -Secondary to  Jejunum narrowing, and kink at the insertion site, patient is known to have history of congenital partial malrotation. - NG tube via fluoroscopic guidance inserted by radiology 12/13 , with significant improvement of symptoms .  Aspiration pneumonia  -Most likely related to copious amount of nausea and vomiting secondary to obstruction . -Continue with IV vancomycin and Zosyn 12/13  -No further hypoxia .  Esophagus cancer:  -Following with oncology as an outpatient -plan is for radiation , radiation oncology follow-up appreciated. -CT surgery follow-up is appreciated.  Chronic back pain: - will use PRN pain medications and muscle relaxants   Protein-calorie malnutrition, moderate: - due to decrease PO intake and cancer. -dietician has been consulted   Hypokalemia -Place, recheck in a.m.  Code Status: Full  Family Communication: None at bedside .  Disposition Plan: Remains inpatient   Procedures  -Placement open feeding jejunostomy tube by Dr. Scherrie Merritts 12/9  Consults   Gen. surgery Radiation oncology CT surgery Oncology  Medications  Scheduled Meds: . antiseptic oral rinse  7 mL Mouth Rinse  q12n4p  . chlorhexidine  15 mL Mouth Rinse BID  . enoxaparin (LOVENOX) injection  40 mg Subcutaneous Q24H  . feeding supplement (PRO-STAT SUGAR FREE 64)  30 mL Oral BID  . pantoprazole (PROTONIX) IV  40 mg Intravenous Q12H  . piperacillin-tazobactam (ZOSYN)  IV  3.375 g Intravenous Q8H    . vancomycin  1,000 mg Intravenous Q8H   Continuous Infusions: . 0.9 % NaCl with KCl 20 mEq / L 75 mL/hr at 04/11/14 0920  . feeding supplement (OSMOLITE 1.5 CAL)     PRN Meds:.acetaminophen **OR** acetaminophen, methocarbamol (ROBAXIN)  IV, metoCLOPramide (REGLAN) injection, morphine injection, ondansetron **OR** ondansetron (ZOFRAN) IV, oxyCODONE, promethazine **OR** promethazine **OR** promethazine  DVT Prophylaxis  Lovenox -  Lab Results  Component Value Date   PLT 163 04/11/2014    Antibiotics   Anti-infectives    Start     Dose/Rate Route Frequency Ordered Stop   04/11/14 0600  piperacillin-tazobactam (ZOSYN) IVPB 3.375 g     3.375 g12.5 mL/hr over 240 Minutes Intravenous Every 8 hours 04/10/14 2111     04/11/14 0600  vancomycin (VANCOCIN) IVPB 1000 mg/200 mL premix     1,000 mg200 mL/hr over 60 Minutes Intravenous Every 8 hours 04/10/14 2111     04/11/14 0400  piperacillin-tazobactam (ZOSYN) IVPB 3.375 g  Status:  Discontinued     3.375 g12.5 mL/hr over 240 Minutes Intravenous Every 8 hours 04/10/14 1910 04/10/14 2110   04/11/14 0400  vancomycin (VANCOCIN) IVPB 1000 mg/200 mL premix  Status:  Discontinued     1,000 mg200 mL/hr over 60 Minutes Intravenous Every 8 hours 04/10/14 1910 04/10/14 2110   04/10/14 2130  piperacillin-tazobactam (ZOSYN) IVPB 3.375 g     3.375 g100 mL/hr over 30 Minutes Intravenous  Once 04/10/14 2111 04/10/14 2155   04/10/14 2130  vancomycin (VANCOCIN) IVPB 1000 mg/200 mL premix     1,000 mg200 mL/hr over 60 Minutes Intravenous  Once 04/10/14 2111 04/10/14 2225   04/10/14 1800  vancomycin (VANCOCIN) IVPB 1000 mg/200 mL premix  Status:  Discontinued     1,000 mg200 mL/hr over 60 Minutes Intravenous  Once 04/10/14 1741 04/10/14 2110   04/10/14 1800  piperacillin-tazobactam (ZOSYN) IVPB 3.375 g  Status:  Discontinued     3.375 g12.5 mL/hr over 240 Minutes Intravenous  Once 04/10/14 1741 04/10/14 2110   04/06/14 1245  cefOXitin (MEFOXIN) 2 g in dextrose  5 % 50 mL IVPB     2 g100 mL/hr over 30 Minutes Intravenous On call to O.R. 04/06/14 1231 04/06/14 1305          Objective:   Filed Vitals:   04/11/14 0400 04/11/14 0500 04/11/14 0600 04/11/14 0800  BP: 116/82 116/74 113/79   Pulse: 109 106 102   Temp: 98.3 F (36.8 C)   98.5 F (36.9 C)  TempSrc: Oral   Oral  Resp: 22 22 23    Height:      Weight: 79.7 kg (175 lb 11.3 oz)     SpO2: 91% 91% 92%     Wt Readings from Last 3 Encounters:  04/11/14 79.7 kg (175 lb 11.3 oz)  04/05/14 82.691 kg (182 lb 4.8 oz)  04/04/14 84.505 kg (186 lb 4.8 oz)     Intake/Output Summary (Last 24 hours) at 04/11/14 0951 Last data filed at 04/11/14 0600  Gross per 24 hour  Intake 1166.25 ml  Output   5300 ml  Net -4133.75 ml     Physical Exam  Awake Alert, Oriented X  3, No new F.N deficits, Normal affect Billy Romero,PERRAL Supple Neck,No JVD, No cervical lymphadenopathy appriciated.  Symmetrical Chest wall movement, Good air movement bilaterally, CTAB RRR,No Gallops,Rubs or new Murmurs, No Parasternal Heave +ve B.Sounds, Abd Soft, No tenderness, No organomegaly appriciated, No rebound - guarding or rigidity.abd J tube coverd with bandage. No Cyanosis, Clubbing or edema, No new Rash or bruise     Data Review   Micro Results Recent Results (from the past 240 hour(s))  Surgical pcr screen     Status: None   Collection Time: 04/06/14 12:00 PM  Result Value Ref Range Status   MRSA, PCR NEGATIVE NEGATIVE Final   Staphylococcus aureus NEGATIVE NEGATIVE Final    Comment:        The Xpert SA Assay (FDA approved for NASAL specimens in patients over 71 years of age), is one component of a comprehensive surveillance program.  Test performance has been validated by [employer] for patients greater than or equal to 69 year old. It is not intended to diagnose infection nor to guide or monitor treatment.   MRSA PCR Screening     Status: None   Collection Time: 04/10/14 10:02 PM  Result  Value Ref Range Status   MRSA by PCR NEGATIVE NEGATIVE Final    Comment:        The GeneXpert MRSA Assay (FDA approved for NASAL specimens only), is one component of a comprehensive MRSA colonization surveillance program. It is not intended to diagnose MRSA infection nor to guide or monitor treatment for MRSA infections.     Radiology Reports Ct Chest Wo Contrast  04/10/2014   CLINICAL DATA:  Fever and chills following nasogastric catheter placement  EXAM: CT CHEST WITHOUT CONTRAST  TECHNIQUE: Multidetector CT imaging of the chest was performed following the standard protocol without IV contrast.  COMPARISON:  CT of the abdomen and pelvis from 04/08/2014  FINDINGS: The lungs are well aerated bilaterally and demonstrate diffuse infiltrative change particularly in the posterior aspect of the right upper lobe as well as the lower lobes bilaterally. Scattered sub solid nodular changes are noted associated with the infiltrate. Considerable fluid is noted within the esophagus consistent with the clinical history of esophageal carcinoma raising suspicion for possible aspiration as a cause for the infiltrate.  Nasogastric catheter is noted coiled within the stomach. No air is noted surrounding the esophagus to suggest perforation. Persistent free air is noted within the abdomen related to the recent jejunostomy placement. This is stable from the prior CT examination.  IMPRESSION: New bilateral infiltrative changes as described. The changes in the esophagus raises suspicion for possible aspiration pneumonia.  Persistent free air within the abdomen stable from the previous exam.   Electronically Signed   By: Inez Catalina M.D.   On: 04/10/2014 19:21    CBC  Recent Labs Lab 04/05/14 1348 04/07/14 0620 04/08/14 0515 04/09/14 0530 04/10/14 1834 04/11/14 0330  WBC 9.8 9.3 8.6 12.0* 7.4 11.4*  HGB 14.5 13.3 13.7 14.5 15.6 14.5  HCT 42.5 38.9* 40.8 42.1 45.5 41.8  PLT 185 171 192 188 183 163  MCV  85.9 86.1 86.3 85.2 84.9 84.6  MCH 29.3 29.4 29.0 29.4 29.1 29.4  MCHC 34.1 34.2 33.6 34.4 34.3 34.7  RDW 12.5 12.5 12.8 12.6 12.6 12.8  LYMPHSABS 1.5  --   --   --   --   --   MONOABS 0.5  --   --   --   --   --  EOSABS 0.0  --   --   --   --   --   BASOSABS 0.0  --   --   --   --   --     Chemistries   Recent Labs Lab 04/05/14 1348 04/07/14 0620 04/08/14 0515 04/09/14 0530 04/10/14 1834 04/11/14 0330  NA 145 136* 139 140 141 140  K 5.1 5.0 3.8 3.4* 3.2* 3.3*  CL 104 100 102 102 102 104  CO2 29 26 27 24 25 24   GLUCOSE 99 140* 128* 139* 154* 144*  BUN 19 17 17 14 18 18   CREATININE 1.10 1.00 0.99 0.88 0.87 0.87  CALCIUM 10.3 9.6 9.2 9.3 9.6 9.0  MG 2.2  --   --   --   --   --   AST 17  --   --   --  74*  --   ALT 13  --   --   --  178*  --   ALKPHOS 96  --   --   --  81  --   BILITOT 1.1  --   --   --  1.8*  --    ------------------------------------------------------------------------------------------------------------------ estimated creatinine clearance is 96.2 mL/min (by C-G formula based on Cr of 0.87). ------------------------------------------------------------------------------------------------------------------ No results for input(s): HGBA1C in the last 72 hours. ------------------------------------------------------------------------------------------------------------------ No results for input(s): CHOL, HDL, LDLCALC, TRIG, CHOLHDL, LDLDIRECT in the last 72 hours. ------------------------------------------------------------------------------------------------------------------ No results for input(s): TSH, T4TOTAL, T3FREE, THYROIDAB in the last 72 hours.  Invalid input(s): FREET3 ------------------------------------------------------------------------------------------------------------------ No results for input(s): VITAMINB12, FOLATE, FERRITIN, TIBC, IRON, RETICCTPCT in the last 72 hours.  Coagulation profile No results for input(s): INR, PROTIME in the  last 168 hours.  No results for input(s): DDIMER in the last 72 hours.  Cardiac Enzymes No results for input(s): CKMB, TROPONINI, MYOGLOBIN in the last 168 hours.  Invalid input(s): CK ------------------------------------------------------------------------------------------------------------------ Invalid input(s): POCBNP     Time Spent in minutes   35 minutes   Meriem Lemieux M.D on 04/11/2014 at 9:51 AM  Between 7am to 7pm - Pager - [phone]  After 7pm go to [email or web address] - password [email or web address]  And look for the night coverage person covering for me after hours  [facility] Office  [phone]   **Disclaimer: This note may have been dictated with voice recognition software. Similar sounding words can inadvertently be transcribed and this note may contain transcription errors which may not have been corrected upon publication of note.**

## 2014-04-11 NOTE — Progress Notes (Signed)
Unable to flush J-Tube this morning. Dr. Waldron Labs called and made aware. No new orders given. Pt is stable. Will continue to monitor.

## 2014-04-12 ENCOUNTER — Telehealth: Payer: Self-pay | Admitting: *Deleted

## 2014-04-12 ENCOUNTER — Encounter: Payer: BC Managed Care – PPO | Admitting: Cardiothoracic Surgery

## 2014-04-12 ENCOUNTER — Inpatient Hospital Stay (HOSPITAL_COMMUNITY): Payer: BC Managed Care – PPO

## 2014-04-12 LAB — BASIC METABOLIC PANEL
ANION GAP: 10 (ref 5–15)
BUN: 18 mg/dL (ref 6–23)
CHLORIDE: 102 meq/L (ref 96–112)
CO2: 27 mEq/L (ref 19–32)
Calcium: 9.1 mg/dL (ref 8.4–10.5)
Creatinine, Ser: 0.89 mg/dL (ref 0.50–1.35)
Glucose, Bld: 109 mg/dL — ABNORMAL HIGH (ref 70–99)
Potassium: 3.2 mEq/L — ABNORMAL LOW (ref 3.7–5.3)
Sodium: 139 mEq/L (ref 137–147)

## 2014-04-12 LAB — GLUCOSE, CAPILLARY
GLUCOSE-CAPILLARY: 90 mg/dL (ref 70–99)
GLUCOSE-CAPILLARY: 90 mg/dL (ref 70–99)
GLUCOSE-CAPILLARY: 101 mg/dL — AB (ref 70–99)
Glucose-Capillary: 104 mg/dL — ABNORMAL HIGH (ref 70–99)
Glucose-Capillary: 81 mg/dL (ref 70–99)
Glucose-Capillary: 100 mg/dL — ABNORMAL HIGH (ref 70–99)
Glucose-Capillary: 114 mg/dL — ABNORMAL HIGH (ref 70–99)

## 2014-04-12 LAB — CBC
HCT: 39.4 % (ref 39.0–52.0)
Hemoglobin: 13.3 g/dL (ref 13.0–17.0)
MCH: 29 pg (ref 26.0–34.0)
MCHC: 33.8 g/dL (ref 30.0–36.0)
MCV: 86 fL (ref 78.0–100.0)
PLATELETS: 152 10*3/uL (ref 150–400)
RBC: 4.58 MIL/uL (ref 4.22–5.81)
RDW: 12.9 % (ref 11.5–15.5)
WBC: 10.5 10*3/uL (ref 4.0–10.5)

## 2014-04-12 LAB — MAGNESIUM: MAGNESIUM: 2 mg/dL (ref 1.5–2.5)

## 2014-04-12 LAB — BILIRUBIN, TOTAL: BILIRUBIN TOTAL: 1.1 mg/dL (ref 0.3–1.2)

## 2014-04-12 LAB — VANCOMYCIN, TROUGH: VANCOMYCIN TR: 9.2 ug/mL — AB (ref 10.0–20.0)

## 2014-04-12 LAB — PHOSPHORUS: Phosphorus: 2.7 mg/dL (ref 2.3–4.6)

## 2014-04-12 MED ORDER — SODIUM CHLORIDE 0.9 % IV SOLN
INTRAVENOUS | Status: AC
  Administered 2014-04-12: 15:00:00 via INTRAVENOUS

## 2014-04-12 MED ORDER — INSULIN ASPART 100 UNIT/ML ~~LOC~~ SOLN: 0.0000 [IU] | Freq: Four times a day (QID) | SUBCUTANEOUS | Status: DC

## 2014-04-12 MED ORDER — HYPROMELLOSE (GONIOSCOPIC) 2.5 % OP SOLN: 2.0000 [drp] | OPHTHALMIC | Status: DC | PRN

## 2014-04-12 MED ORDER — SODIUM CHLORIDE 0.9 % IV SOLN: INTRAVENOUS | Status: DC

## 2014-04-12 MED ORDER — FAT EMULSION 20 % IV EMUL
250.0000 mL | INTRAVENOUS | Status: DC
  Filled 2014-04-12: qty 250

## 2014-04-12 MED ORDER — TRACE MINERALS CR-CU-F-FE-I-MN-MO-SE-ZN IV SOLN
INTRAVENOUS | Status: DC
  Filled 2014-04-12: qty 1000

## 2014-04-12 MED ORDER — MENTHOL 3 MG MT LOZG: 1.0000 | LOZENGE | OROMUCOSAL | Status: DC | PRN

## 2014-04-12 MED ORDER — POLYVINYL ALCOHOL 1.4 % OP SOLN
2.0000 [drp] | OPHTHALMIC | Status: DC | PRN
  Filled 2014-04-12: qty 15

## 2014-04-12 MED ORDER — SODIUM CHLORIDE 0.9 % IJ SOLN
10.0000 mL | Freq: Two times a day (BID) | INTRAMUSCULAR | Status: DC
  Administered 2014-04-12: 10 mL
  Administered 2014-04-13: 30 mL
  Administered 2014-04-14 – 2014-04-21 (×3): 10 mL

## 2014-04-12 MED ORDER — SODIUM CHLORIDE 0.9 % IJ SOLN: 10.0000 mL | INTRAMUSCULAR | Status: DC | PRN

## 2014-04-12 MED ORDER — VANCOMYCIN HCL 10 G IV SOLR
1250.0000 mg | Freq: Three times a day (TID) | INTRAVENOUS | Status: DC
  Administered 2014-04-12 – 2014-04-16 (×11): 1250 mg via INTRAVENOUS
  Filled 2014-04-12 (×12): qty 1250

## 2014-04-12 MED ORDER — OSMOLITE 1.5 CAL PO LIQD
1000.0000 mL | ORAL | Status: DC
  Filled 2014-04-12: qty 1000

## 2014-04-12 MED ORDER — POTASSIUM CHLORIDE 10 MEQ/100ML IV SOLN
10.0000 meq | INTRAVENOUS | Status: AC
  Administered 2014-04-12 (×3): 10 meq via INTRAVENOUS
  Filled 2014-04-12 (×2): qty 100

## 2014-04-12 MED ORDER — SODIUM CHLORIDE 0.9 % IV SOLN
INTRAVENOUS | Status: DC
  Administered 2014-04-12 (×2): via INTRAVENOUS

## 2014-04-12 MED ORDER — INSULIN ASPART 100 UNIT/ML ~~LOC~~ SOLN
0.0000 [IU] | Freq: Four times a day (QID) | SUBCUTANEOUS | Status: DC
  Administered 2014-04-14 – 2014-04-17 (×12): 1 [IU] via SUBCUTANEOUS
  Administered 2014-04-18 (×2): 2 [IU] via SUBCUTANEOUS
  Administered 2014-04-18 (×2): 1 [IU] via SUBCUTANEOUS
  Administered 2014-04-19: 2 [IU] via SUBCUTANEOUS
  Administered 2014-04-20: 1 [IU] via SUBCUTANEOUS
  Administered 2014-04-21: 2 [IU] via SUBCUTANEOUS
  Administered 2014-04-21: 1 [IU] via SUBCUTANEOUS

## 2014-04-12 MED ORDER — POTASSIUM CHLORIDE 10 MEQ/100ML IV SOLN
INTRAVENOUS | Status: AC
  Filled 2014-04-12: qty 100

## 2014-04-12 MED ORDER — TRACE MINERALS CR-CU-F-FE-I-MN-MO-SE-ZN IV SOLN
INTRAVENOUS | Status: AC
  Administered 2014-04-12: 18:00:00 via INTRAVENOUS
  Filled 2014-04-12: qty 1000

## 2014-04-12 MED ORDER — FAT EMULSION 20 % IV EMUL
250.0000 mL | INTRAVENOUS | Status: AC
  Administered 2014-04-12: 250 mL via INTRAVENOUS
  Filled 2014-04-12: qty 250

## 2014-04-12 NOTE — Progress Notes (Signed)
6 Days Post-Op  Subjective: Passed a little gas.  J-tube clogged  Objective: Vital signs in last 24 hours: Temp:  [97.4 F (36.3 C)-99.6 F (37.6 C)] 97.4 F (36.3 C) (12/15 0400) Pulse Rate:  [78-106] 81 (12/15 0600) Resp:  [8-25] 13 (12/15 0600) BP: (111-144)/(71-92) 141/80 mmHg (12/15 0600) SpO2:  [91 %-96 %] 95 % (12/15 0600) Weight:  [175 lb (79.379 kg)] 175 lb (79.379 kg) (12/15 0600) Last BM Date: 04/07/14  Intake/Output from previous day: 12/14 0701 - 12/15 0700 In: 4187.5 [P.O.:440; I.V.:3247.5; IV Piggyback:500] Out: 2550 [Urine:700; Emesis/NG output:1850] Intake/Output this shift:    PE: General- In NAD Abdomen-soft, flat, not tender, hypoactive bowel sounds, incision clean and intact, j-tube on left and does not flush  Lab Results:   Recent Labs  04/11/14 0330 04/12/14 0343  WBC 11.4* 10.5  HGB 14.5 13.3  HCT 41.8 39.4  PLT 163 152   BMET  Recent Labs  04/11/14 0330 04/12/14 0343  NA 140 139  K 3.3* 3.2*  CL 104 102  CO2 24 27  GLUCOSE 144* 109*  BUN 18 18  CREATININE 0.87 0.89  CALCIUM 9.0 9.1   PT/INR No results for input(s): LABPROT, INR in the last 72 hours. Comprehensive Metabolic Panel:    Component Value Date/Time   NA 139 04/12/2014 0343   NA 140 04/11/2014 0330   K 3.2* 04/12/2014 0343   K 3.3* 04/11/2014 0330   CL 102 04/12/2014 0343   CL 104 04/11/2014 0330   CO2 27 04/12/2014 0343   CO2 24 04/11/2014 0330   BUN 18 04/12/2014 0343   BUN 18 04/11/2014 0330   CREATININE 0.89 04/12/2014 0343   CREATININE 0.87 04/11/2014 0330   GLUCOSE 109* 04/12/2014 0343   GLUCOSE 144* 04/11/2014 0330   CALCIUM 9.1 04/12/2014 0343   CALCIUM 9.0 04/11/2014 0330   AST 74* 04/10/2014 1834   AST 17 04/05/2014 1348   ALT 178* 04/10/2014 1834   ALT 13 04/05/2014 1348   ALKPHOS 81 04/10/2014 1834   ALKPHOS 96 04/05/2014 1348   BILITOT 1.8* 04/10/2014 1834   BILITOT 1.1 04/05/2014 1348   PROT 6.5 04/10/2014 1834   PROT 7.5 04/05/2014  1348   ALBUMIN 3.5 04/10/2014 1834   ALBUMIN 4.3 04/05/2014 1348     Studies/Results: Ct Chest Wo Contrast  04/10/2014   CLINICAL DATA:  Fever and chills following nasogastric catheter placement  EXAM: CT CHEST WITHOUT CONTRAST  TECHNIQUE: Multidetector CT imaging of the chest was performed following the standard protocol without IV contrast.  COMPARISON:  CT of the abdomen and pelvis from 04/08/2014  FINDINGS: The lungs are well aerated bilaterally and demonstrate diffuse infiltrative change particularly in the posterior aspect of the right upper lobe as well as the lower lobes bilaterally. Scattered sub solid nodular changes are noted associated with the infiltrate. Considerable fluid is noted within the esophagus consistent with the clinical history of esophageal carcinoma raising suspicion for possible aspiration as a cause for the infiltrate.  Nasogastric catheter is noted coiled within the stomach. No air is noted surrounding the esophagus to suggest perforation. Persistent free air is noted within the abdomen related to the recent jejunostomy placement. This is stable from the prior CT examination.  IMPRESSION: New bilateral infiltrative changes as described. The changes in the esophagus raises suspicion for possible aspiration pneumonia.  Persistent free air within the abdomen stable from the previous exam.   Electronically Signed   By: Linus Mako.D.  On: 04/10/2014 19:21   Dg Duanne Limerick W/water Sol Cm  04/11/2014   CLINICAL DATA:  Nausea and vomiting. High nasogastric tube output. Edema versus obstructing lesion in the duodenum, proximal to a percutaneous jejunostomy.  EXAM: WATER SOLUBLE UPPER GI SERIES  TECHNIQUE: Single-column upper GI series was performed using water soluble contrast.  : COMPARISON:  CT abdomen pelvis 04/08/2014.  FLUOROSCOPY TIME:  4 min 24 seconds.  FINDINGS: Scout view of the abdomen shows a nasogastric tube looped in the stomach. A percutaneous jejunostomy is seen in the  right abdomen. Bowel gas pattern is unremarkable.  Omnipaque was hand-injected through the nasogastric tube. There is mild dilatation of the duodenum. Contrast slowly passed through the duodenum into the proximal jejunum when the patient was placed in the RPO and right lateral positions. Evaluation is somewhat limited with water-soluble contrast but there is no definite obstructing lesion. Contrast flowed along the course of the jejunostomy tube into the mid jejunum.  IMPRESSION: Mild dilatation of the duodenum with slow transit of contrast into the proximal jejunum, along the course of the jejunostomy tube, best seen when the patient was placed in the RPO and right lateral positions. Edema is favored over an obstructing lesion.   Electronically Signed   By: Lorin Picket M.D.   On: 04/11/2014 11:44    Anti-infectives: Anti-infectives    Start     Dose/Rate Route Frequency Ordered Stop   04/11/14 0600  piperacillin-tazobactam (ZOSYN) IVPB 3.375 g     3.375 g12.5 mL/hr over 240 Minutes Intravenous Every 8 hours 04/10/14 2111     04/11/14 0600  vancomycin (VANCOCIN) IVPB 1000 mg/200 mL premix     1,000 mg200 mL/hr over 60 Minutes Intravenous Every 8 hours 04/10/14 2111     04/11/14 0400  piperacillin-tazobactam (ZOSYN) IVPB 3.375 g  Status:  Discontinued     3.375 g12.5 mL/hr over 240 Minutes Intravenous Every 8 hours 04/10/14 1910 04/10/14 2110   04/11/14 0400  vancomycin (VANCOCIN) IVPB 1000 mg/200 mL premix  Status:  Discontinued     1,000 mg200 mL/hr over 60 Minutes Intravenous Every 8 hours 04/10/14 1910 04/10/14 2110   04/10/14 2130  piperacillin-tazobactam (ZOSYN) IVPB 3.375 g     3.375 g100 mL/hr over 30 Minutes Intravenous  Once 04/10/14 2111 04/10/14 2155   04/10/14 2130  vancomycin (VANCOCIN) IVPB 1000 mg/200 mL premix     1,000 mg200 mL/hr over 60 Minutes Intravenous  Once 04/10/14 2111 04/10/14 2225   04/10/14 1800  vancomycin (VANCOCIN) IVPB 1000 mg/200 mL premix  Status:   Discontinued     1,000 mg200 mL/hr over 60 Minutes Intravenous  Once 04/10/14 1741 04/10/14 2110   04/10/14 1800  piperacillin-tazobactam (ZOSYN) IVPB 3.375 g  Status:  Discontinued     3.375 g12.5 mL/hr over 240 Minutes Intravenous  Once 04/10/14 1741 04/10/14 2110   04/06/14 1245  cefOXitin (MEFOXIN) 2 g in dextrose 5 % 50 mL IVPB     2 g100 mL/hr over 30 Minutes Intravenous On call to O.R. 04/06/14 1231 04/06/14 1305      Assessment Obstructing esophageal cancer s/p open jejunostomy tube placement 04/06/14-ng output down some;  UGI shows tube is not obstructing lumen thus he likely has an ileus exacerbated by persistent hypokalemia; too early to replace due because fibrous tract has not formed yet.    CT of chest suggests aspiration pneumonia and abxs started.   LOS: 7 days   Plan: Replace potassium deficit, PICC and TPN, will need to  wait 2 weeks before replacing j-tube.  Can initiate XRT now from my standpoint.  Leave ng in until ileus resolves.  Sherronda Sweigert J 04/12/2014

## 2014-04-12 NOTE — Telephone Encounter (Signed)
Called  Room 1342 nurse station spoke with his nurse Carolyn,RN, informed her patient to have a CT Simlation tomorrow morning it is scheduled for 9am, and our transporters Vivien Rota and Nicki Reaper will come and get the patient around 830am,pleas medicate patient for any pain, or am meds, Ct simulation should run about 45 minutes here in [place] at the [facility], RN gave verbal understanding 4:17 PM

## 2014-04-12 NOTE — Clinical Documentation Improvement (Signed)
Supporting Information: Pt developed fever, chills, tachycardia and hypoxia. CT ordered to r/o perforation and pharmacy consulted to dose vancomycin and zosyn for sepsis per 12/13 Pharmacy note.  Labs: 12/14:  lactic acid: 2.3.   Possible Diagnosis? . Bacteremia (positive blood cultures only) . Sepsis-specify causative organism if known . Sepsis due to: --Device --Implant --Graft --Infusion . Severe sepsis-sepsis with organ dysfunction --Specify organ dysfunction Respiratory failure Encephalopathy Acute kidney failure Other (specify) . SIRS (Systemic Inflammatory Response Syndrome --With or without organ dysfunction . Document septic shock if present . Document any associated diagnoses/conditions    Thank Sherian Maroon Documentation Specialist [phone] Adiyah [email or web address]

## 2014-04-12 NOTE — Progress Notes (Signed)
ANTIBIOTIC CONSULT NOTE  Pharmacy Consult for Vancomycin and Zosyn Indication: rule out sepsis  No Known Allergies  Patient Measurements: Height: 5\' 11"  (180.3 cm) Weight: 175 lb (79.379 kg) IBW/kg (Calculated) : 75.3  Vital Signs: Temp: 98.1 F (36.7 C) (12/15 1200) Temp Source: Oral (12/15 1200) BP: 140/80 mmHg (12/15 1200) Pulse Rate: 89 (12/15 1200) Intake/Output from previous day: 12/14 0701 - 12/15 0700 In: 4187.5 [P.O.:440; I.V.:3247.5; IV Piggyback:500] Out: 2550 [Urine:700; Emesis/NG output:1850] Intake/Output from this shift: Total I/O In: 1130 [I.V.:900; NG/GT:30; IV Piggyback:200] Out: 1275 [Urine:675; Emesis/NG output:600]  Labs:  Recent Labs  04/10/14 1834 04/11/14 0330 04/12/14 0343  WBC 7.4 11.4* 10.5  HGB 15.6 14.5 13.3  PLT 183 163 152  CREATININE 0.87 0.87 0.89   Estimated Creatinine Clearance: 94 mL/min (by C-G formula based on Cr of 0.89).  Recent Labs  04/12/14 1315  [place] 9.2*    Microbiology: Recent Results (from the past 720 hour(s))  Surgical pcr screen     Status: None   Collection Time: 04/06/14 12:00 PM  Result Value Ref Range Status   MRSA, PCR NEGATIVE NEGATIVE Final   Staphylococcus aureus NEGATIVE NEGATIVE Final    Comment:        The Xpert SA Assay (FDA approved for NASAL specimens in patients over 57 years of age), is one component of a comprehensive surveillance program.  Test performance has been validated by [employer] for patients greater than or equal to 70 year old. It is not intended to diagnose infection nor to guide or monitor treatment.   Culture, blood (routine x 2)     Status: None (Preliminary result)   Collection Time: 04/10/14  6:05 PM  Result Value Ref Range Status   Specimen Description BLOOD LEFT HAND  Final   Special Requests BOTTLES DRAWN AEROBIC AND ANAEROBIC 5CC  Final   Culture  Setup Time   Final    04/11/2014 03:11 Performed at [facility]    Culture   Final     BLOOD CULTURE RECEIVED NO GROWTH TO DATE CULTURE WILL BE HELD FOR 5 DAYS BEFORE ISSUING A FINAL NEGATIVE REPORT Performed at [facility]    Report Status PENDING  Incomplete  Culture, blood (routine x 2)     Status: None (Preliminary result)   Collection Time: 04/10/14  6:27 PM  Result Value Ref Range Status   Specimen Description BLOOD LEFT ARM  Final   Special Requests BOTTLES DRAWN AEROBIC AND ANAEROBIC 10CC  Final   Culture  Setup Time   Final    04/11/2014 03:11 Performed at [facility]    Culture   Final           BLOOD CULTURE RECEIVED NO GROWTH TO DATE CULTURE WILL BE HELD FOR 5 DAYS BEFORE ISSUING A FINAL NEGATIVE REPORT Note: Culture results may be compromised due to an excessive volume of blood received in culture bottles. Performed at [facility]    Report Status PENDING  Incomplete  MRSA PCR Screening     Status: None   Collection Time: 04/10/14 10:02 PM  Result Value Ref Range Status   MRSA by PCR NEGATIVE NEGATIVE Final    Comment:        The GeneXpert MRSA Assay (FDA approved for NASAL specimens only), is one component of a comprehensive MRSA colonization surveillance program. It is not intended to diagnose MRSA infection nor to guide or monitor treatment for MRSA infections.    Medical History:  Past Medical History  Diagnosis Date  . Esophageal cancer 03/29/14   Medications:  Scheduled:  . antiseptic oral rinse  7 mL Mouth Rinse q12n4p  . chlorhexidine  15 mL Mouth Rinse BID  . enoxaparin (LOVENOX) injection  40 mg Subcutaneous Q24H  . pantoprazole (PROTONIX) IV  40 mg Intravenous Q12H  . piperacillin-tazobactam (ZOSYN)  IV  3.375 g Intravenous Q8H  . sodium chloride  10-40 mL Intracatheter Q12H  . vancomycin  1,000 mg Intravenous Q8H   Infusions:  . sodium chloride    . feeding supplement (OSMOLITE 1.5 CAL)     Assessment: 58 yo male with hx alcohol abuse (sober for last 9 years), chronic back and neck pain and  esophageal cancer with complete esophagus obstruction causing dysphagia. Admitted from [facility] on 12/8 due to inability to eat or drink and dehydration. He is s/p open feeding jejunostomy tube on 12/9 and developed significant n/v after TF started. NGT inserted in IR 12/13 with immediate output. Pt developed fever, chills, tachycardia and hypoxia. CT ordered to r/o perforation and pharmacy consulted to dose vancomycin and zosyn for sepsis.  12/13 >> Vancomycin >> 12/13 >> Zosyn >>  Tmax: 101.9 WBC: 12k Renal: SCr 0.88, CrCl 95CG, 90N  12/13 blood: ngtd 12/15 urine: pending  Vanc trough at 1300 = 9.2 mcg/ml (below goal 15-20 mcg/ml)  Goal of Therapy:  Vancomycin trough level 15-20 mcg/ml  Zosyn dosage per indication, renal function  Plan:  Zosyn 3.375gm IV q8h (4hr extended infusions) Increase Vancomycin to 1250mg  IV q8h recheck trough at steady state with increased dose Follow up renal function & cultures, clinical course  Minda Ditto PharmD Pager [phone] 04/12/2014, 2:54 PM

## 2014-04-12 NOTE — Progress Notes (Addendum)
Patient Demographics  Billy Romero, is a 58 y.o. male, DOB - Mar 20, 1956, ZES:[id number]  Admit date - 04/05/2014   Admitting Physician Barton Dubois, MD  Outpatient Primary MD for the patient is Lujean Amel, MD  LOS - 7   No chief complaint on file.     Admission history of present illness/brief narrative:   HPI: Billy Romero is a 58 y.o. 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 Admitted from [facility] due to inability to eat or drink and dehydration. Patient reports that symptoms has been steadily progressing and worsening over the last week or two and currently having trouble with keeping down even his own saliva. Plan is for patient to be admitted for IVF's resuscitation and also placement of J tube for tube feedings and nutrition. Patient had Placement open feeding jejunostomy tube by Dr. Scherrie Merritts 12/9. He had significant nausea and vomiting after his tube feeds were started, CT abdomen with contrast showing evidence of narrowing at the insertion type, as well having a kink at the insertion site . Given patient continuous nausea and vomiting, he had nasogastric tube inserted via fluoroscopic guidance by radiology, patient had 3600 mL output within half an hour from tube insertion, patient developed fever, hypoxia, tachycardia, CT chest showing evidence of bilateral aspiration pneumonia. Patient is improving on IV vancomycin and Zosyn, unfortunately his J-tube remains clogged, so he is being started on TPN.   Subjective:   Billy Romero today has, No headache, No chest pain, No abdominal pain reports significant improvement of nausea and vomiting after NG tube  insertion, had fever, cough , hypoxia yesterday . Assessment & Plan    Principal Problem:   Dysphagia, pharyngoesophageal phase Active Problems:   Esophagus cancer   Chronic back pain   Protein-calorie malnutrition, severe   Dysphagia   Malnutrition of moderate degree   Esophageal carcinoma  Dysphagia, pharyngoesophageal phase:  -secondary to complete obstruction of esophagus due to cancer. -continue IVF's resuscitation -Placement open feeding jejunostomy tube by Dr. Scherrie Merritts 12/9 -electrolytes repletion as needed  Nausea and vomiting / Small bowel obstruction at J-tube insertion site. -Secondary to  Jejunum narrowing, and kink at the insertion site, patient is known to have history of congenital partial malrotation. - NG tube via fluoroscopic guidance inserted by radiology 12/13 , with significant improvement of symptoms . -UGI shows tube is not obstructing the lumen and thus possible ileus (patient has hypokalemia)  Aspiration pneumonia  -Most likely related to copious amount of nausea and vomiting secondary to obstruction . -Continue with IV vancomycin and Zosyn 12/13  -No further hypoxia .  Esophagus cancer:  -Following with oncology as an outpatient -plan is for radiation , radiation oncology follow-up appreciated. -CT surgery follow-up is appreciated.  Chronic back pain: - will use PRN pain medications and muscle relaxants   Protein-calorie malnutrition, moderate: - due to decrease PO intake and cancer. -dietician has been consulted - Will be started on TPN   Hypokalemia -rePlace, recheck in a.m.  Code Status: Full  Family Communication: Spoke with wife 04/11/14.  Disposition  Plan: Remains inpatient. Downgraded to medical floor.   Procedures  -Placement open feeding jejunostomy tube by Dr. Scherrie Merritts 12/9 - NG tube inserted by radiology  via fluoroscopic guidance 12/15 Consults   Gen. surgery Radiation oncology CT  surgery Oncology  Medications  Scheduled Meds: . antiseptic oral rinse  7 mL Mouth Rinse q12n4p  . chlorhexidine  15 mL Mouth Rinse BID  . enoxaparin (LOVENOX) injection  40 mg Subcutaneous Q24H  . pantoprazole (PROTONIX) IV  40 mg Intravenous Q12H  . piperacillin-tazobactam (ZOSYN)  IV  3.375 g Intravenous Q8H  . sodium chloride  10-40 mL Intracatheter Q12H  . vancomycin  1,000 mg Intravenous Q8H   Continuous Infusions: . sodium chloride 150 mL/hr at 04/11/14 1121   PRN Meds:.acetaminophen **OR** acetaminophen, menthol-cetylpyridinium, methocarbamol (ROBAXIN)  IV, metoCLOPramide (REGLAN) injection, morphine injection, ondansetron **OR** ondansetron (ZOFRAN) IV, oxyCODONE, promethazine **OR** promethazine **OR** promethazine, sodium chloride  DVT Prophylaxis  Lovenox -  Lab Results  Component Value Date   PLT 152 04/12/2014    Antibiotics   Anti-infectives    Start     Dose/Rate Route Frequency Ordered Stop   04/11/14 0600  piperacillin-tazobactam (ZOSYN) IVPB 3.375 g     3.375 g12.5 mL/hr over 240 Minutes Intravenous Every 8 hours 04/10/14 2111     04/11/14 0600  vancomycin (VANCOCIN) IVPB 1000 mg/200 mL premix     1,000 mg200 mL/hr over 60 Minutes Intravenous Every 8 hours 04/10/14 2111     04/11/14 0400  piperacillin-tazobactam (ZOSYN) IVPB 3.375 g  Status:  Discontinued     3.375 g12.5 mL/hr over 240 Minutes Intravenous Every 8 hours 04/10/14 1910 04/10/14 2110   04/11/14 0400  vancomycin (VANCOCIN) IVPB 1000 mg/200 mL premix  Status:  Discontinued     1,000 mg200 mL/hr over 60 Minutes Intravenous Every 8 hours 04/10/14 1910 04/10/14 2110   04/10/14 2130  piperacillin-tazobactam (ZOSYN) IVPB 3.375 g     3.375 g100 mL/hr over 30 Minutes Intravenous  Once 04/10/14 2111 04/10/14 2155   04/10/14 2130  vancomycin (VANCOCIN) IVPB 1000 mg/200 mL premix     1,000 mg200 mL/hr over 60 Minutes Intravenous  Once 04/10/14 2111 04/10/14 2225   04/10/14 1800  vancomycin (VANCOCIN) IVPB  1000 mg/200 mL premix  Status:  Discontinued     1,000 mg200 mL/hr over 60 Minutes Intravenous  Once 04/10/14 1741 04/10/14 2110   04/10/14 1800  piperacillin-tazobactam (ZOSYN) IVPB 3.375 g  Status:  Discontinued     3.375 g12.5 mL/hr over 240 Minutes Intravenous  Once 04/10/14 1741 04/10/14 2110   04/06/14 1245  cefOXitin (MEFOXIN) 2 g in dextrose 5 % 50 mL IVPB     2 g100 mL/hr over 30 Minutes Intravenous On call to O.R. 04/06/14 1231 04/06/14 1305          Objective:   Filed Vitals:   04/12/14 0400 04/12/14 0430 04/12/14 0600 04/12/14 0800  BP: 127/75  141/80 149/85  Pulse: 79  81 87  Temp: 97.4 F (36.3 C)   97.7 F (36.5 C)  TempSrc: Oral   Oral  Resp: 16  13 22   Height:      Weight:  79.379 kg (175 lb) 79.379 kg (175 lb)   SpO2: 94%  95% 97%    Wt Readings from Last 3 Encounters:  04/12/14 79.379 kg (175 lb)  04/05/14 82.691 kg (182 lb 4.8 oz)  04/04/14 84.505 kg (186 lb 4.8 oz)     Intake/Output Summary (Last 24 hours) at 04/12/14  [place] filed at 04/12/14 0606  Gross per 24 hour  Intake 3612.5 ml  Output   2350 ml  Net 1262.5 ml     Physical Exam  Awake Alert, Oriented X 3, No new F.N deficits, Normal affect [place].AT,PERRAL Supple Neck,No JVD, No cervical lymphadenopathy appriciated.  Symmetrical Chest wall movement, Good air movement bilaterally, CTAB RRR,No Gallops,Rubs or new Murmurs, No Parasternal Heave +ve B.Sounds, Abd Soft, No tenderness, No organomegaly appriciated, No rebound - guarding or rigidity.abd J tube coverd with bandage. No Cyanosis, Clubbing or edema, No new Rash or bruise     Data Review   Micro Results Recent Results (from the past 240 hour(s))  Surgical pcr screen     Status: None   Collection Time: 04/06/14 12:00 PM  Result Value Ref Range Status   MRSA, PCR NEGATIVE NEGATIVE Final   Staphylococcus aureus NEGATIVE NEGATIVE Final    Comment:        The Xpert SA Assay (FDA approved for NASAL specimens in patients  over 52 years of age), is one component of a comprehensive surveillance program.  Test performance has been validated by [employer] for patients greater than or equal to 78 year old. It is not intended to diagnose infection nor to guide or monitor treatment.   Culture, blood (routine x 2)     Status: None (Preliminary result)   Collection Time: 04/10/14  6:05 PM  Result Value Ref Range Status   Specimen Description BLOOD LEFT HAND  Final   Special Requests BOTTLES DRAWN AEROBIC AND ANAEROBIC 5CC  Final   Culture  Setup Time   Final    04/11/2014 03:11 Performed at [facility]    Culture   Final           BLOOD CULTURE RECEIVED NO GROWTH TO DATE CULTURE WILL BE HELD FOR 5 DAYS BEFORE ISSUING A FINAL NEGATIVE REPORT Performed at [facility]    Report Status PENDING  Incomplete  Culture, blood (routine x 2)     Status: None (Preliminary result)   Collection Time: 04/10/14  6:27 PM  Result Value Ref Range Status   Specimen Description BLOOD LEFT ARM  Final   Special Requests BOTTLES DRAWN AEROBIC AND ANAEROBIC 10CC  Final   Culture  Setup Time   Final    04/11/2014 03:11 Performed at [facility]    Culture   Final           BLOOD CULTURE RECEIVED NO GROWTH TO DATE CULTURE WILL BE HELD FOR 5 DAYS BEFORE ISSUING A FINAL NEGATIVE REPORT Note: Culture results may be compromised due to an excessive volume of blood received in culture bottles. Performed at [facility]    Report Status PENDING  Incomplete  MRSA PCR Screening     Status: None   Collection Time: 04/10/14 10:02 PM  Result Value Ref Range Status   MRSA by PCR NEGATIVE NEGATIVE Final    Comment:        The GeneXpert MRSA Assay (FDA approved for NASAL specimens only), is one component of a comprehensive MRSA colonization surveillance program. It is not intended to diagnose MRSA infection nor to guide or monitor treatment for MRSA infections.     Radiology Reports Ct Chest  Wo Contrast  04/10/2014   CLINICAL DATA:  Fever and chills following nasogastric catheter placement  EXAM: CT CHEST WITHOUT CONTRAST  TECHNIQUE: Multidetector CT imaging of the chest was performed following the standard protocol  without IV contrast.  COMPARISON:  CT of the abdomen and pelvis from 04/08/2014  FINDINGS: The lungs are well aerated bilaterally and demonstrate diffuse infiltrative change particularly in the posterior aspect of the right upper lobe as well as the lower lobes bilaterally. Scattered sub solid nodular changes are noted associated with the infiltrate. Considerable fluid is noted within the esophagus consistent with the clinical history of esophageal carcinoma raising suspicion for possible aspiration as a cause for the infiltrate.  Nasogastric catheter is noted coiled within the stomach. No air is noted surrounding the esophagus to suggest perforation. Persistent free air is noted within the abdomen related to the recent jejunostomy placement. This is stable from the prior CT examination.  IMPRESSION: New bilateral infiltrative changes as described. The changes in the esophagus raises suspicion for possible aspiration pneumonia.  Persistent free air within the abdomen stable from the previous exam.   Electronically Signed   By: Inez Catalina M.D.   On: 04/10/2014 19:21   Dg Intro Long Gi Tube  04/12/2014   CLINICAL DATA:  Such ill carcinoma. Recent surgical J-tube placement. Gastric distention, nausea, vomiting  EXAM: INTRO LONG GI TUBE  TECHNIQUE: Viscous lidocaine was applied to the right nasal passages. Under fluoroscopy, a 5 [place] the catheter was advanced from the right nares to the stomach. This was exchanged over an Amplatz wire for standard nasogastric tube, secured externally. Tip in the stomach.  CONTRAST:  None  FLUOROSCOPY TIME:  58 seconds  COMPARISON:  CT 04/08/2014  FINDINGS: Nasogastric tube placement to the stomach.  IMPRESSION: 1. Technically successful  nasogastric tube placement with fluoroscopic guidance.   Electronically Signed   By: Arne Cleveland M.D.   On: 04/12/2014 08:54   Dg Duanne Limerick W/water Sol Cm  04/11/2014   CLINICAL DATA:  Nausea and vomiting. High nasogastric tube output. Edema versus obstructing lesion in the duodenum, proximal to a percutaneous jejunostomy.  EXAM: WATER SOLUBLE UPPER GI SERIES  TECHNIQUE: Single-column upper GI series was performed using water soluble contrast.  : COMPARISON:  CT abdomen pelvis 04/08/2014.  FLUOROSCOPY TIME:  4 min 24 seconds.  FINDINGS: Scout view of the abdomen shows a nasogastric tube looped in the stomach. A percutaneous jejunostomy is seen in the right abdomen. Bowel gas pattern is unremarkable.  Omnipaque was hand-injected through the nasogastric tube. There is mild dilatation of the duodenum. Contrast slowly passed through the duodenum into the proximal jejunum when the patient was placed in the RPO and right lateral positions. Evaluation is somewhat limited with water-soluble contrast but there is no definite obstructing lesion. Contrast flowed along the course of the jejunostomy tube into the mid jejunum.  IMPRESSION: Mild dilatation of the duodenum with slow transit of contrast into the proximal jejunum, along the course of the jejunostomy tube, best seen when the patient was placed in the RPO and right lateral positions. Edema is favored over an obstructing lesion.   Electronically Signed   By: Lorin Picket M.D.   On: 04/11/2014 11:44    CBC  Recent Labs Lab 04/05/14 1348  04/08/14 0515 04/09/14 0530 04/10/14 1834 04/11/14 0330 04/12/14 0343  WBC 9.8  < > 8.6 12.0* 7.4 11.4* 10.5  HGB 14.5  < > 13.7 14.5 15.6 14.5 13.3  HCT 42.5  < > 40.8 42.1 45.5 41.8 39.4  PLT 185  < > 192 188 183 163 152  MCV 85.9  < > 86.3 85.2 84.9 84.6 86.0  MCH 29.3  < > 29.0  29.4 29.1 29.4 29.0  MCHC 34.1  < > 33.6 34.4 34.3 34.7 33.8  RDW 12.5  < > 12.8 12.6 12.6 12.8 12.9  LYMPHSABS 1.5  --   --   --    --   --   --   MONOABS 0.5  --   --   --   --   --   --   EOSABS 0.0  --   --   --   --   --   --   BASOSABS 0.0  --   --   --   --   --   --   < > = values in this interval not displayed.  Chemistries   Recent Labs Lab 04/05/14 1348  04/08/14 0515 04/09/14 0530 04/10/14 1834 04/11/14 0330 04/12/14 0343  NA 145  < > 139 140 141 140 139  K 5.1  < > 3.8 3.4* 3.2* 3.3* 3.2*  CL 104  < > 102 102 102 104 102  CO2 29  < > 27 24 25 24 27   GLUCOSE 99  < > 128* 139* 154* 144* 109*  BUN 19  < > 17 14 18 18 18   CREATININE 1.10  < > 0.99 0.88 0.87 0.87 0.89  CALCIUM 10.3  < > 9.2 9.3 9.6 9.0 9.1  MG 2.2  --   --   --   --   --  2.0  AST 17  --   --   --  74*  --   --   ALT 13  --   --   --  178*  --   --   ALKPHOS 96  --   --   --  81  --   --   BILITOT 1.1  --   --   --  1.8*  --  1.1  < > = values in this interval not displayed. ------------------------------------------------------------------------------------------------------------------ estimated creatinine clearance is 94 mL/min (by C-G formula based on Cr of 0.89). ------------------------------------------------------------------------------------------------------------------ No results for input(s): HGBA1C in the last 72 hours. ------------------------------------------------------------------------------------------------------------------ No results for input(s): CHOL, HDL, LDLCALC, TRIG, CHOLHDL, LDLDIRECT in the last 72 hours. ------------------------------------------------------------------------------------------------------------------ No results for input(s): TSH, T4TOTAL, T3FREE, THYROIDAB in the last 72 hours.  Invalid input(s): FREET3 ------------------------------------------------------------------------------------------------------------------ No results for input(s): VITAMINB12, FOLATE, FERRITIN, TIBC, IRON, RETICCTPCT in the last 72 hours.  Coagulation profile No results for input(s): INR, PROTIME in the  last 168 hours.  No results for input(s): DDIMER in the last 72 hours.  Cardiac Enzymes No results for input(s): CKMB, TROPONINI, MYOGLOBIN in the last 168 hours.  Invalid input(s): CK ------------------------------------------------------------------------------------------------------------------ Invalid input(s): POCBNP     Time Spent in minutes   35 minutes   ELGERGAWY, DAWOOD M.D on 04/12/2014 at 11:11 AM  Between 7am to 7pm - Pager - [phone]  After 7pm go to [email or web address] - password [email or web address]  And look for the night coverage person covering for me after hours  [facility] Office  [phone]   **Disclaimer: This note may have been dictated with voice recognition software. Similar sounding words can inadvertently be transcribed and this note may contain transcription errors which may not have been corrected upon publication of note.**

## 2014-04-12 NOTE — Progress Notes (Addendum)
NUTRITION FOLLOW-UP  INTERVENTION: -TPN per pharmacy; recommend conservative advancement d/t concern for refeeding (pt with > 7 days sup-optimal nutrition intake); monitor for at least 3 days and replete as needed -Recommend 24 hr infusion during admit with transition to 64-40 hour cyclic regimen once pt stable for d/c (pt unable to utilize J-tube for two weeks; will likely require TPN at home) -Recommend CM c/s for assistance with home nutrition support needs -Transition to enteral nutrition as tolerated/Jtube access available -RD to continue to monitor   NUTRITION DIAGNOSIS: Inadequate oral intake related to esophageal cancer as evidenced by need for nutrition support, ongoing.  Goal: Pt to meet >/= 90% of their estimated nutrition needs, will meet with TPN   Monitor:  TPN regimen & tolerance, weight, labs, I/O's  ASSESSMENT: 58 y.o. Admitted with progressive dysphagia. He is now unable to tolerate any liquids. PMHX of Esophagus cancer, squamous cell carcinoma, Weight loss, and ulcerative colitis.  12/8 -Pt reports not being able to drink any liquids, states that all liquids come back up. This has been occuring for the past day. Prior to this, pt was able to drink liquids and consume pureed foods with intermittent tolerance some days. -Pt states that he has lost 15-20 lb x 3-4 months. Pt with 28 lb weight loss, 13% weight loss x 3-4 months, significant for time frame.  -Pt followed by [place] RD, last seen 12/7. Pt is scheduled to begin radiation treatments next week. -RD to follow-up with patient for more education regarding TF regimen when wife is present. Will discuss home feeding regimen before discharge. -Nutrition focused physical exam shows no sign of depletion of muscle mass or body fat.  12/10 -J-tube placed 12/9 -RD consulted for TF initiation and management -Pt reports tolerating TF so far, not yet at goal. No issues. Pt feels much better. -Pt and wife with many  questions regarding TF regimen, transition to home feedings and how the pump works. -RD to review home TF regimen on 12/11 before discharge, Pt's wife would like to be present during education.  12/11: - Pt's TF held due to pt experiencing nausea and vomiting. Per MD note, TF to be re-started at a lower rate. No indication of ileus or blockage.  - Spoke with pt's wife regarding home TF regimen as RD not available tomorrow. Questions were answered. RD to follow-up with pt and wife on Sunday.   Details for Home Feedings Feedings per J-tube TF: Osmolite 1.5 @ goal rate of 110 ml/hr over 12 hours (tentatively 8pm-8am).  30 ml Prostat BID Free water: 60 ml before and after each continuous feeding. Patient will need an additional 875 ml per day (~8 oz QID).  Above to provide: 2180 kcal, 113g protein, and 2000 ml free water  Labs reviewed: Mg/Phos/K WNL Low Na Glucose 140  12/14: -MD noted pt's Jtube shifted to RUQ, resulting in a "kink" and duodenal and gastric distension -NG tube place for suction on 12/13, initially with 2400 ml output. Improving today with 1200 ml output -Osmolite 1.5 infusing at 25 ml/hr on 12/13. Held d/t high output. Per discussion with RN, Jtube unable to be flushed this am.  -Pt currently in radiology, RN reported plan to utilize carbonated beverage to assist in unclogging Jtube, and plan to restart Osmolite 1.5 if clog dissipates -Continue with previous recommendations for advancement rate.  -CBG elevated but within goal of < 150 mg/dL -K low, receiving IVF replacement -Wt decreased 7 lb since admit, likely r/t to pt  with -4L fluid balance, and enteral nutrition intolerance  12/15: -Jtube remains clogged after several attempts by RN to flush/unclog tube -Per UGI results, surgery noted tube not causing obstruction and pt with likely leus exacerbated by hypokalemia. Unable to replace Jtube for 2 weeks as fibrous tract not formed yet -NG continue on suction, output  improving, ~ 1850 ml in past 24 hours -PICC line placed on 12/15 to initiate TPN -Discussed TPN with pharmacy. Recommend conservative advancement (advance to goal 3-5 days d/t refeeding risk, can modify advancement rate pending labs); monitor K, Phos, Mg and replete as warranted -Discussed TPN with pt and pt's wife; had concerns regarding discharge needs and use of Jtube in future. Recommend transition to cyclic reigmen pending d/c plan; and utilize Jtube as replacement tolerated. Would benefit from case management assistance. -Pt and wife very apprehensive about future use of Jtube. Will continue to educate regarding nutrition support needs/plans -Phos/Mg WNL -K Low -CBG WNL  Height: Ht Readings from Last 1 Encounters:  04/10/14 _0  (1.803 m)    Weight: Wt Readings from Last 1 Encounters:  04/12/14 175 lb (79.379 kg)  04/08/14 185 lb 04/05/14 182 lb  BMI:  Body mass index is 24.42 kg/(m^2).  Estimated Nutritional Needs: Kcal: 2000-2200 Protein: 105-115g Fluid: 2L/day  Skin: intact; surgical incision  Diet Order: Diet NPO time specified Except for: Ice Chips TPN (CLINIMIX-E) Adult  EDUCATION NEEDS: -Education needs met   Intake/Output Summary (Last 24 hours) at 04/12/14 1306 Last data filed at 04/12/14 1200  Gross per 24 hour  Intake   4195 ml  Output   2225 ml  Net   1970 ml    Last BM: 12/11  Labs:   Recent Labs Lab 04/05/14 1348  04/10/14 1834 04/11/14 0330 04/12/14 0343  NA 145  < > 141 140 139  K 5.1  < > 3.2* 3.3* 3.2*  CL 104  < > 102 104 102  CO2 29  < > _1 BUN 19  < > _2 CREATININE 1.10  < > 0.87 0.87 0.89  CALCIUM 10.3  < > 9.6 9.0 9.1  MG 2.2  --   --   --  2.0  PHOS 3.4  --   --   --  2.7  GLUCOSE 99  < > 154* 144* 109*  < > = values in this interval not displayed.  CBG (last 3)   Recent Labs  04/12/14 0459 04/12/14 0749 04/12/14 1202  GLUCAP 90 101* 81    Scheduled Meds: . antiseptic oral rinse  7 mL Mouth  Rinse q12n4p  . chlorhexidine  15 mL Mouth Rinse BID  . enoxaparin (LOVENOX) injection  40 mg Subcutaneous Q24H  . insulin aspart  0-9 Units Subcutaneous 4 times per day  . pantoprazole (PROTONIX) IV  40 mg Intravenous Q12H  . piperacillin-tazobactam (ZOSYN)  IV  3.375 g Intravenous Q8H  . sodium chloride  10-40 mL Intracatheter Q12H  . vancomycin  1,000 mg Intravenous Q8H    Continuous Infusions: . sodium chloride 150 mL/hr at 04/11/14 1121  . sodium chloride    . Marland KitchenTPN (CLINIMIX-E) Adult     And  . fat emulsion      Atlee Abide MS RD LDN Clinical Dietitian [phone]

## 2014-04-12 NOTE — Progress Notes (Signed)
Patient ID: Billy Romero, male   DOB: 1955/11/02, 58 y.o.   MRN: [id number] Amplatz guidewire along with sterile NS flushes utilized to clear jejunostomy tube today. Tube in good position per abd film.

## 2014-04-12 NOTE — Progress Notes (Addendum)
PARENTERAL NUTRITION CONSULT NOTE - INITIAL  Pharmacy Consult for TPN Indication: Intolerance to enteral feeding  No Known Allergies  Patient Measurements: Height: 5\' 11"  (180.3 cm) Weight: 175 lb (79.379 kg) IBW/kg (Calculated) : 75.3 Adjusted Body Weight: 76.5 kg  Vital Signs: Temp: 98.1 F (36.7 C) (12/15 1200) Temp Source: Oral (12/15 1200) BP: 140/80 mmHg (12/15 1200) Pulse Rate: 89 (12/15 1200) Intake/Output from previous day: 12/14 0701 - 12/15 0700 In: 4187.5 [P.O.:440; I.V.:3247.5; IV Piggyback:500] Out: 2550 [Urine:700; Emesis/NG output:1850] Intake/Output from this shift: Total I/O In: 980 [I.V.:750; NG/GT:30; IV Piggyback:200] Out: 975 [Urine:475; Emesis/NG output:500]  Labs:  Recent Labs  04/10/14 1834 04/11/14 0330 04/12/14 0343  WBC 7.4 11.4* 10.5  HGB 15.6 14.5 13.3  HCT 45.5 41.8 39.4  PLT 183 163 152     Recent Labs  04/10/14 1834 04/11/14 0330 04/12/14 0343  NA 141 140 139  K 3.2* 3.3* 3.2*  CL 102 104 102  CO2 25 24 27   GLUCOSE 154* 144* 109*  BUN 18 18 18   CREATININE 0.87 0.87 0.89  CALCIUM 9.6 9.0 9.1  MG  --   --  2.0  PHOS  --   --  2.7  PROT 6.5  --   --   ALBUMIN 3.5  --   --   AST 74*  --   --   ALT 178*  --   --   ALKPHOS 81  --   --   BILITOT 1.8*  --  1.1   Estimated Creatinine Clearance: 94 mL/min (by C-G formula based on Cr of 0.89).    Recent Labs  04/12/14 0459 04/12/14 0749 04/12/14 1202  GLUCAP 90 101* 81    Medical History: Past Medical History  Diagnosis Date  . Esophageal cancer 03/29/14   Insulin Requirements:  None  Current Nutrition:  NPO  IVF:  NS at 150 mL/hr  Central access: PICC placed 04/12/14 TPN start date: 04/12/14  ASSESSMENT                                                                                                          HPI:  58 y/o M with PMH of alcohol abuse (sober for last 9 years), chronic back and neck pain, and esophageal cancer with complete esophagus  obstruction causing dysphagia. Admitted from [facility] on 12/8 due to inability to eat or drink and dehydration. He is s/p open feeding jejunostomy tube on 12/9 and developed significant n/v after TF started. NGT inserted in IR 12/13 with immediate output. Pt developed fever, chills, tachycardia and hypoxia. CT of chest showed bilateral aspiration pneumonia, for which he is on broad spectrum antibiotics, Vancomycin and Zosyn. J tube became clogged, so patient ordered to be transitioned from tube feeds to TPN for nutrition, with pharmacy to assist with dosing.    Today:    Glucose - CBGs at goal < 150  Electrolytes - K low at 3.2, all others WNL  Renal - SCr WNL, 0.89  LFTs - AST/ALT elevated on 12/13, Tbili now WNL  TGs -  pending  Prealbumin - pending  NUTRITIONAL GOALS                                                                                             RD recs:  Kcal: 2000-2200 Protein: 105-115g Fluid: 2L/day  Clinimix E 5/15 at a goal rate of 90 ml/hr + 20% fat emulsion at 10 ml/hr to provide: 108 g/day protein, 2013 Kcal/day.  PLAN                                                                                                                         At 1800 today:  Start Clinimix E 5/15 at 40 ml/hr.  20% fat emulsion at 10 ml/hr.  Plan to advance as tolerated to the goal rate. Will advance slowly due to risk of refeeding.  TPN to contain standard multivitamins and trace elements.  Reduce IVF to 110 ml/hr.  Add Sensitive Scale SSI q6h.   KCl replaced this AM per surgery.  CMET, Mag, Phos in AM.  TPN lab panels on Mondays & Thursdays.  F/u daily.  Lindell Spar, PharmD, BCPS Pager: [phone] 04/12/2014 11:42 AM    ADDENDUM:  Radiology able to unclog J-tube, and per Dr. Waldron Labs, okay to try patient back on tube feeds.  Tube feed orders have been initiated by dietician.  If unable to tolerate, will start TPN tonight as per above plan.   Lindell Spar,  PharmD, BCPS Pager: [phone] 04/12/2014 2:43 PM

## 2014-04-12 NOTE — Progress Notes (Signed)
Peripherally Inserted Central Catheter/Midline Placement  The IV Nurse has discussed with the patient and/or persons authorized to consent for the patient, the purpose of this procedure and the potential benefits and risks involved with this procedure.  The benefits include less needle sticks, lab draws from the catheter and patient may be discharged home with the catheter.  Risks include, but not limited to, infection, bleeding, blood clot (thrombus formation), and puncture of an artery; nerve damage and irregular heat beat.  Alternatives to this procedure were also discussed.  PICC/Midline Placement Documentation        Billy Romero 04/12/2014, 8:57 AM

## 2014-04-13 ENCOUNTER — Ambulatory Visit: Payer: BC Managed Care – PPO

## 2014-04-13 ENCOUNTER — Ambulatory Visit
Admission: RE | Admit: 2014-04-13 | Discharge: 2014-04-13 | Disposition: A | Discharge status: Home / Self Care | Payer: 59 | Source: Ambulatory Visit | Attending: Radiation Oncology | Admitting: Radiation Oncology

## 2014-04-13 ENCOUNTER — Telehealth: Payer: Self-pay | Admitting: *Deleted

## 2014-04-13 ENCOUNTER — Encounter: Payer: BC Managed Care – PPO | Admitting: Nutrition

## 2014-04-13 ENCOUNTER — Other Ambulatory Visit: Payer: BC Managed Care – PPO

## 2014-04-13 DIAGNOSIS — C154 Malignant neoplasm of middle third of esophagus: Secondary | ICD-10-CM

## 2014-04-13 DIAGNOSIS — C159 Malignant neoplasm of esophagus, unspecified: Principal | ICD-10-CM

## 2014-04-13 DIAGNOSIS — K5669 Other intestinal obstruction: Secondary | ICD-10-CM

## 2014-04-13 DIAGNOSIS — E43 Unspecified severe protein-calorie malnutrition: Secondary | ICD-10-CM

## 2014-04-13 DIAGNOSIS — K56609 Unspecified intestinal obstruction, unspecified as to partial versus complete obstruction: Secondary | ICD-10-CM | POA: Insufficient documentation

## 2014-04-13 LAB — BASIC METABOLIC PANEL
Anion gap: 12 (ref 5–15)
BUN: 11 mg/dL (ref 6–23)
CO2: 25 mEq/L (ref 19–32)
Calcium: 8.9 mg/dL (ref 8.4–10.5)
Chloride: 98 mEq/L (ref 96–112)
Creatinine, Ser: 0.76 mg/dL (ref 0.50–1.35)
GFR calc non Af Amer: >90 mL/min (ref >90)
Glucose, Bld: 123 mg/dL — ABNORMAL HIGH (ref 70–99)
Potassium: 3.5 mEq/L — ABNORMAL LOW (ref 3.7–5.3)
Sodium: 135 mEq/L — ABNORMAL LOW (ref 137–147)

## 2014-04-13 LAB — DIFFERENTIAL
Basophils Absolute: 0 10*3/uL (ref 0.0–0.1)
Basophils Relative: 0 % (ref 0–1)
Eosinophils Absolute: 0.1 10*3/uL (ref 0.0–0.7)
Eosinophils Relative: 1 % (ref 0–5)
LYMPHS PCT: 10 % — AB (ref 12–46)
Lymphs Abs: 1.1 10*3/uL (ref 0.7–4.0)
Monocytes Absolute: 0.8 10*3/uL (ref 0.1–1.0)
Monocytes Relative: 7 % (ref 3–12)
NEUTROS ABS: 9.4 10*3/uL — AB (ref 1.7–7.7)
NEUTROS PCT: 82 % — AB (ref 43–77)

## 2014-04-13 LAB — CBC
HCT: 36.8 % — ABNORMAL LOW (ref 39.0–52.0)
Hemoglobin: 12.6 g/dL — ABNORMAL LOW (ref 13.0–17.0)
MCH: 28.9 pg (ref 26.0–34.0)
MCHC: 34.2 g/dL (ref 30.0–36.0)
MCV: 84.4 fL (ref 78.0–100.0)
Platelets: 182 10*3/uL (ref 150–400)
RBC: 4.36 MIL/uL (ref 4.22–5.81)
RDW: 12.6 % (ref 11.5–15.5)
WBC: 11.4 10*3/uL — ABNORMAL HIGH (ref 4.0–10.5)

## 2014-04-13 LAB — COMPREHENSIVE METABOLIC PANEL
ALT: 52 U/L (ref 0–53)
ANION GAP: 11 (ref 5–15)
AST: 14 U/L (ref 0–37)
Albumin: 2.7 g/dL — ABNORMAL LOW (ref 3.5–5.2)
Alkaline Phosphatase: 65 U/L (ref 39–117)
BUN: 13 mg/dL (ref 6–23)
CO2: 27 mEq/L (ref 19–32)
Calcium: 8.8 mg/dL (ref 8.4–10.5)
Chloride: 99 mEq/L (ref 96–112)
Creatinine, Ser: 0.74 mg/dL (ref 0.50–1.35)
GFR calc Af Amer: >90 mL/min (ref >90)
GFR calc non Af Amer: >90 mL/min (ref >90)
Glucose, Bld: 126 mg/dL — ABNORMAL HIGH (ref 70–99)
Potassium: 3 mEq/L — ABNORMAL LOW (ref 3.7–5.3)
Sodium: 137 mEq/L (ref 137–147)
TOTAL PROTEIN: 6 g/dL (ref 6.0–8.3)
Total Bilirubin: 0.9 mg/dL (ref 0.3–1.2)

## 2014-04-13 LAB — URINE CULTURE
COLONY COUNT: NO GROWTH
CULTURE: NO GROWTH
Special Requests: NORMAL

## 2014-04-13 LAB — GLUCOSE, CAPILLARY
GLUCOSE-CAPILLARY: 116 mg/dL — AB (ref 70–99)
Glucose-Capillary: 101 mg/dL — ABNORMAL HIGH (ref 70–99)
Glucose-Capillary: 111 mg/dL — ABNORMAL HIGH (ref 70–99)
Glucose-Capillary: 119 mg/dL — ABNORMAL HIGH (ref 70–99)

## 2014-04-13 LAB — PREALBUMIN: PREALBUMIN: 6.5 mg/dL — AB (ref 17.0–34.0)

## 2014-04-13 LAB — PHOSPHORUS: Phosphorus: 2.6 mg/dL (ref 2.3–4.6)

## 2014-04-13 LAB — MAGNESIUM: Magnesium: 2 mg/dL (ref 1.5–2.5)

## 2014-04-13 LAB — TRIGLYCERIDES: Triglycerides: 121 mg/dL (ref <150)

## 2014-04-13 MED ORDER — TRACE MINERALS CR-CU-F-FE-I-MN-MO-SE-ZN IV SOLN
INTRAVENOUS | Status: AC
  Administered 2014-04-13: 18:00:00 via INTRAVENOUS
  Filled 2014-04-13: qty 2000

## 2014-04-13 MED ORDER — POTASSIUM CHLORIDE 10 MEQ/50ML IV SOLN
10.0000 meq | INTRAVENOUS | Status: AC
  Administered 2014-04-13 (×6): 10 meq via INTRAVENOUS
  Filled 2014-04-13 (×6): qty 50

## 2014-04-13 MED ORDER — POTASSIUM CHLORIDE 2 MEQ/ML IV SOLN
INTRAVENOUS | Status: AC
  Administered 2014-04-13: 18:00:00 via INTRAVENOUS
  Filled 2014-04-13 (×2): qty 1000

## 2014-04-13 MED ORDER — FAT EMULSION 20 % IV EMUL
250.0000 mL | INTRAVENOUS | Status: AC
  Administered 2014-04-13: 250 mL via INTRAVENOUS
  Filled 2014-04-13: qty 250

## 2014-04-13 MED ORDER — POTASSIUM CHLORIDE 10 MEQ/100ML IV SOLN
10.0000 meq | INTRAVENOUS | Status: DC
  Filled 2014-04-13 (×5): qty 100

## 2014-04-13 MED ORDER — POTASSIUM CHLORIDE 10 MEQ/50ML IV SOLN
10.0000 meq | INTRAVENOUS | Status: AC
  Administered 2014-04-13 – 2014-04-14 (×6): 10 meq via INTRAVENOUS
  Filled 2014-04-13 (×6): qty 50

## 2014-04-13 MED ORDER — LORAZEPAM 2 MG/ML IJ SOLN: 1.0000 mg | Freq: Once | INTRAMUSCULAR | Status: DC

## 2014-04-13 MED ORDER — SODIUM CHLORIDE 0.9 % IV SOLN
INTRAVENOUS | Status: AC
  Administered 2014-04-13: 10:00:00 via INTRAVENOUS
  Filled 2014-04-13 (×2): qty 1000

## 2014-04-13 NOTE — Progress Notes (Signed)
7 Days Post-Op  Subjective: No BM or flatus.  Romero-tube unclogged now.  Objective: Vital signs in last 24 hours: Temp:  [98.1 F (36.7 C)-99.8 F (37.7 C)] 98.6 F (37 C) (12/16 0625) Pulse Rate:  [76-89] 81 (12/16 0625) Resp:  [16-20] 16 (12/16 0625) BP: (138-151)/(75-80) 151/78 mmHg (12/16 0625) SpO2:  [95 %-98 %] 96 % (12/16 0625) Weight:  [175 lb (79.379 kg)-179 lb (81.194 kg)] 179 lb (81.194 kg) (12/16 0625) Last BM Date: 04/07/14  Intake/Output from previous day: 12/15 0701 - 12/16 0700 In: 5007.7 [I.V.:4327.7; NG/GT:30; IV Piggyback:650] Out: 2956 [Urine:1625; Emesis/NG output:1550] Intake/Output this shift:    PE: General- In NAD Abdomen-soft, flat, not tender, no bowel sounds, incision clean and intact, Romero-tube on left   Lab Results:   Recent Labs  04/12/14 0343 04/13/14 0600  WBC 10.5 11.4*  HGB 13.3 12.6*  HCT 39.4 36.8*  PLT 152 182   BMET  Recent Labs  04/12/14 0343 04/13/14 0600  NA 139 137  K 3.2* 3.0*  CL 102 99  CO2 27 27  GLUCOSE 109* 126*  BUN 18 13  CREATININE 0.89 0.74  CALCIUM 9.1 8.8   PT/INR No results for input(s): LABPROT, INR in the last 72 hours. Comprehensive Metabolic Panel:    Component Value Date/Time   NA 137 04/13/2014 0600   NA 139 04/12/2014 0343   K 3.0* 04/13/2014 0600   K 3.2* 04/12/2014 0343   CL 99 04/13/2014 0600   CL 102 04/12/2014 0343   CO2 27 04/13/2014 0600   CO2 27 04/12/2014 0343   BUN 13 04/13/2014 0600   BUN 18 04/12/2014 0343   CREATININE 0.74 04/13/2014 0600   CREATININE 0.89 04/12/2014 0343   GLUCOSE 126* 04/13/2014 0600   GLUCOSE 109* 04/12/2014 0343   CALCIUM 8.8 04/13/2014 0600   CALCIUM 9.1 04/12/2014 0343   AST 14 04/13/2014 0600   AST 74* 04/10/2014 1834   ALT 52 04/13/2014 0600   ALT 178* 04/10/2014 1834   ALKPHOS 65 04/13/2014 0600   ALKPHOS 81 04/10/2014 1834   BILITOT 0.9 04/13/2014 0600   BILITOT 1.1 04/12/2014 0343   PROT 6.0 04/13/2014 0600   PROT 6.5 04/10/2014 1834   ALBUMIN 2.7* 04/13/2014 0600   ALBUMIN 3.5 04/10/2014 1834     Studies/Results: Dg Abd 1 View  04/12/2014   CLINICAL DATA:  Nonfunctioning jejunostomy tube.  EXAM: ABDOMEN - 1 VIEW  COMPARISON:  04/11/2014  FINDINGS: The jejunostomy tube is unchanged in position. It was demonstrated to be within the jejunum during the upper GI dated 04/11/2014. NG tube is coiled in the fundus of the stomach.  Bowel gas pattern is normal. Contrast from the previous upper GI is now in the nondistended colon. There are no visible dilated loops of bowel.  IMPRESSION: Tubes appear in good position. No dilated large or small bowel. Contrast has passed from the small bowel into the colon.   Electronically Signed   By: Rozetta Nunnery M.D.   On: 04/12/2014 11:11   Dg Ugi W/water Sol Cm  04/11/2014   CLINICAL DATA:  Nausea and vomiting. High nasogastric tube output. Edema versus obstructing lesion in the duodenum, proximal to a percutaneous jejunostomy.  EXAM: WATER SOLUBLE UPPER GI SERIES  TECHNIQUE: Single-column upper GI series was performed using water soluble contrast.  : COMPARISON:  CT abdomen pelvis 04/08/2014.  FLUOROSCOPY TIME:  4 min 24 seconds.  FINDINGS: Scout view of the abdomen shows a nasogastric tube looped in the  stomach. A percutaneous jejunostomy is seen in the right abdomen. Bowel gas pattern is unremarkable.  Omnipaque was hand-injected through the nasogastric tube. There is mild dilatation of the duodenum. Contrast slowly passed through the duodenum into the proximal jejunum when the patient was placed in the RPO and right lateral positions. Evaluation is somewhat limited with water-soluble contrast but there is no definite obstructing lesion. Contrast flowed along the course of the jejunostomy tube into the mid jejunum.  IMPRESSION: Mild dilatation of the duodenum with slow transit of contrast into the proximal jejunum, along the course of the jejunostomy tube, best seen when the patient was placed in the RPO  and right lateral positions. Edema is favored over an obstructing lesion.   Electronically Signed   By: Lorin Picket M.D.   On: 04/11/2014 11:44    Anti-infectives: Anti-infectives    Start     Dose/Rate Route Frequency Ordered Stop   04/12/14 2200  vancomycin (VANCOCIN) 1,250 mg in sodium chloride 0.9 % 250 mL IVPB     1,250 mg166.7 mL/hr over 90 Minutes Intravenous Every 8 hours 04/12/14 1455     04/11/14 0600  piperacillin-tazobactam (ZOSYN) IVPB 3.375 g     3.375 g12.5 mL/hr over 240 Minutes Intravenous Every 8 hours 04/10/14 2111     04/11/14 0600  vancomycin (VANCOCIN) IVPB 1000 mg/200 mL premix  Status:  Discontinued     1,000 mg200 mL/hr over 60 Minutes Intravenous Every 8 hours 04/10/14 2111 04/12/14 1454   04/11/14 0400  piperacillin-tazobactam (ZOSYN) IVPB 3.375 g  Status:  Discontinued     3.375 g12.5 mL/hr over 240 Minutes Intravenous Every 8 hours 04/10/14 1910 04/10/14 2110   04/11/14 0400  vancomycin (VANCOCIN) IVPB 1000 mg/200 mL premix  Status:  Discontinued     1,000 mg200 mL/hr over 60 Minutes Intravenous Every 8 hours 04/10/14 1910 04/10/14 2110   04/10/14 2130  piperacillin-tazobactam (ZOSYN) IVPB 3.375 g     3.375 g100 mL/hr over 30 Minutes Intravenous  Once 04/10/14 2111 04/10/14 2155   04/10/14 2130  vancomycin (VANCOCIN) IVPB 1000 mg/200 mL premix     1,000 mg200 mL/hr over 60 Minutes Intravenous  Once 04/10/14 2111 04/10/14 2225   04/10/14 1800  vancomycin (VANCOCIN) IVPB 1000 mg/200 mL premix  Status:  Discontinued     1,000 mg200 mL/hr over 60 Minutes Intravenous  Once 04/10/14 1741 04/10/14 2110   04/10/14 1800  piperacillin-tazobactam (ZOSYN) IVPB 3.375 g  Status:  Discontinued     3.375 g12.5 mL/hr over 240 Minutes Intravenous  Once 04/10/14 1741 04/10/14 2110   04/06/14 1245  cefOXitin (MEFOXIN) 2 g in dextrose 5 % 50 mL IVPB     2 g100 mL/hr over 30 Minutes Intravenous On call to O.R. 04/06/14 1231 04/06/14 1305      Assessment Obstructing esophageal  cancer s/p open jejunostomy tube placement 04/06/14-ng output down some;  UGI shows tube is not obstructing lumen thus he  has an ileus exacerbated by persistent hypokalemia   CT of chest suggests aspiration pneumonia and abxs started.    PC Malnutrition-TPN started   LOS: 8 days   Plan: More aggressive potassium deficit replacement as this continues to exacerbate his ileus; wait for return of bowel function before restarting tube feeds. Billy Romero 04/13/2014

## 2014-04-13 NOTE — Progress Notes (Signed)
NUTRITION FOLLOW-UP  INTERVENTION: -TPN per pharmacy; recommend conservative advancement d/t concern for refeeding (pt with > 7 days sup-optimal nutrition intake); monitor for at least 3 days and replete as needed -Discussed with pharmacy increase to 50 ml/hr today; with possible increase in advancement rate tomorrow pending refeeding labs -Transition to enteral nutrition as tolerated/Jtube access available -RD to continue to monitor   For D/C: -Recommend 24 hr infusion during admit with transition to 15-40 hour cyclic regimen once pt stable for d/c (pt unable to utilize J-tube for two weeks; will likely require TPN at home) -Recommend CM c/s for assistance with home nutrition support needs  NUTRITION DIAGNOSIS: Inadequate oral intake related to esophageal cancer as evidenced by need for nutrition support, ongoing.  Goal: Pt to meet >/= 90% of their estimated nutrition needs, will meet with TPN   Monitor:  TPN regimen & tolerance, weight, labs, I/O's  ASSESSMENT: 58 y.o. Admitted with progressive dysphagia. He is now unable to tolerate any liquids. PMHX of Esophagus cancer, squamous cell carcinoma, Weight loss, and ulcerative colitis.  12/8 -Pt reports not being able to drink any liquids, states that all liquids come back up. This has been occuring for the past day. Prior to this, pt was able to drink liquids and consume pureed foods with intermittent tolerance some days. -Pt states that he has lost 15-20 lb x 3-4 months. Pt with 28 lb weight loss, 13% weight loss x 3-4 months, significant for time frame.  -Pt followed by [place] RD, last seen 12/7. Pt is scheduled to begin radiation treatments next week. -RD to follow-up with patient for more education regarding TF regimen when wife is present. Will discuss home feeding regimen before discharge. -Nutrition focused physical exam shows no sign of depletion of muscle mass or body fat.  12/10 -J-tube placed 12/9 -RD consulted  for TF initiation and management -Pt reports tolerating TF so far, not yet at goal. No issues. Pt feels much better. -Pt and wife with many questions regarding TF regimen, transition to home feedings and how the pump works. -RD to review home TF regimen on 12/11 before discharge, Pt's wife would like to be present during education.  12/11: - Pt's TF held due to pt experiencing nausea and vomiting. Per MD note, TF to be re-started at a lower rate. No indication of ileus or blockage.  - Spoke with pt's wife regarding home TF regimen as RD not available tomorrow. Questions were answered. RD to follow-up with pt and wife on Sunday.   Details for Home Feedings Feedings per J-tube TF: Osmolite 1.5 @ goal rate of 110 ml/hr over 12 hours (tentatively 8pm-8am).  30 ml Prostat BID Free water: 60 ml before and after each continuous feeding. Patient will need an additional 875 ml per day (~8 oz QID).  Above to provide: 2180 kcal, 113g protein, and 2000 ml free water  Labs reviewed: Mg/Phos/K WNL Low Na Glucose 140  12/14: -MD noted pt's Jtube shifted to RUQ, resulting in a "kink" and duodenal and gastric distension -NG tube place for suction on 12/13, initially with 2400 ml output. Improving today with 1200 ml output -Osmolite 1.5 infusing at 25 ml/hr on 12/13. Held d/t high output. Per discussion with RN, Jtube unable to be flushed this am.  -Pt currently in radiology, RN reported plan to utilize carbonated beverage to assist in unclogging Jtube, and plan to restart Osmolite 1.5 if clog dissipates -Continue with previous recommendations for advancement rate.  -CBG elevated but  within goal of < 150 mg/dL -K low, receiving IVF replacement -Wt decreased 7 lb since admit, likely r/t to pt with -4L fluid balance, and enteral nutrition intolerance  12/15: -Jtube remains clogged after several attempts by RN to flush/unclog tube -Per UGI results, surgery noted tube not causing obstruction and pt with  likely leus exacerbated by hypokalemia. Unable to replace Jtube for 2 weeks as fibrous tract not formed yet -NG continue on suction, output improving, ~ 1850 ml in past 24 hours -PICC line placed on 12/15 to initiate TPN -Discussed TPN with pharmacy. Recommend conservative advancement (advance to goal 3-5 days d/t refeeding risk, can modify advancement rate pending labs); monitor K, Phos, Mg and replete as warranted -Discussed TPN with pt and pt's wife; had concerns regarding discharge needs and use of Jtube in future. Recommend transition to cyclic reigmen pending d/c plan; and utilize Jtube as replacement tolerated. Would benefit from case management assistance. -Pt and wife very apprehensive about future use of Jtube. Will continue to educate regarding nutrition support needs/plans -Phos/Mg WNL -K Low -CBG WNL  12/16: -Clinimix E 5/15 infusing at 40 ml/hr. Held for radiation treatment earlier today, and resumed rate w/out difficulty -Phos/Mg WNL; recommend conservative advancement to 50 ml/hr; will follow up with pharmacy on 12/17 to discuss further advances (pending labs) -K low; being aggressively repleted to also assist with ileus -Per pharmacy note;Clinimix E 5/15 at a goal rate of 90 ml/hr + 20% fat emulsion at 10 ml/hr to provide: 108 g/day protein, 2013 Kcal/day. -Will meet 100% est kcal and protein needs -Per surgery, holding off use of Jtube until ileus resolved/bowel function -NG output 1550 ml in 24 hr; 550 ml today -Some nausea noted; receiving Reglan -CBG slightly elevated but within goal of < 150 mg/dL  Height: Ht Readings from Last 1 Encounters:  04/12/14 _0  (1.803 m)    Weight: Wt Readings from Last 1 Encounters:  04/13/14 179 lb (81.194 kg)  04/08/14 185 lb 04/05/14 182 lb  BMI:  Body mass index is 24.98 kg/(m^2).  Estimated Nutritional Needs: Kcal: 2000-2200 Protein: 105-115g Fluid: 2L/day  Skin: intact; surgical incision  Diet Order: Diet NPO time  specified Except for: Ice Chips TPN (CLINIMIX-E) Adult TPN (CLINIMIX-E) Adult  EDUCATION NEEDS: -Education needs met   Intake/Output Summary (Last 24 hours) at 04/13/14 1539 Last data filed at 04/13/14 1351  Gross per 24 hour  Intake 3877.67 ml  Output   3275 ml  Net 602.67 ml    Last BM: 12/10  Labs:   Recent Labs Lab 04/11/14 0330 04/12/14 0343 04/13/14 0600  NA 140 139 137  K 3.3* 3.2* 3.0*  CL 104 102 99  CO2 _1 BUN _2 CREATININE 0.87 0.89 0.74  CALCIUM 9.0 9.1 8.8  MG  --  2.0 2.0  PHOS  --  2.7 2.6  GLUCOSE 144* 109* 126*    CBG (last 3)   Recent Labs  04/13/14 0004 04/13/14 0602 04/13/14 1224  GLUCAP 101* 116* 119*    Scheduled Meds: . antiseptic oral rinse  7 mL Mouth Rinse q12n4p  . chlorhexidine  15 mL Mouth Rinse BID  . enoxaparin (LOVENOX) injection  40 mg Subcutaneous Q24H  . insulin aspart  0-9 Units Subcutaneous 4 times per day  . LORazepam  1 mg Intravenous Once  . pantoprazole (PROTONIX) IV  40 mg Intravenous Q12H  . piperacillin-tazobactam (ZOSYN)  IV  3.375 g Intravenous Q8H  . sodium chloride  10-40 mL Intracatheter Q12H  . vancomycin  1,250 mg Intravenous Q8H    Continuous Infusions: . Marland KitchenTPN (CLINIMIX-E) Adult 40 mL/hr at 04/12/14 1739   And  . fat emulsion 250 mL (04/12/14 1739)  . Marland KitchenTPN (CLINIMIX-E) Adult     And  . fat emulsion    . sodium chloride 0.9 % 1,000 mL with potassium chloride 40 mEq infusion 100 mL/hr at 04/13/14 1017  . sodium chloride 0.9 % 1,000 mL with potassium chloride 40 mEq infusion      Atlee Abide MS RD LDN Clinical Dietitian [phone]

## 2014-04-13 NOTE — Telephone Encounter (Signed)
No additional note

## 2014-04-13 NOTE — Progress Notes (Signed)
PARENTERAL NUTRITION CONSULT NOTE  Pharmacy Consult for TPN Indication: Intolerance to enteral feeding  No Known Allergies  Patient Measurements: Height: 5\' 11"  (180.3 cm) Weight: 179 lb (81.194 kg) IBW/kg (Calculated) : 75.3 Adjusted Body Weight: 76.5 kg  Vital Signs: Temp: 98.6 F (37 C) (12/16 0625) Temp Source: Oral (12/16 0625) BP: 151/78 mmHg (12/16 0625) Pulse Rate: 81 (12/16 0625) Intake/Output from previous day: 12/15 0701 - 12/16 0700 In: 5007.7 [I.V.:4327.7; NG/GT:30; IV Piggyback:650] Out: 2202 [Urine:1625; Emesis/NG output:2150] Intake/Output from this shift: Total I/O In: -  Out: 275 [Urine:275]  Labs:  Recent Labs  04/11/14 0330 04/12/14 0343 04/13/14 0600  WBC 11.4* 10.5 11.4*  HGB 14.5 13.3 12.6*  HCT 41.8 39.4 36.8*  PLT 163 152 182     Recent Labs  04/10/14 1834 04/11/14 0330 04/12/14 0343 04/13/14 0600  NA 141 140 139 137  K 3.2* 3.3* 3.2* 3.0*  CL 102 104 102 99  CO2 25 24 27 27   GLUCOSE 154* 144* 109* 126*  BUN 18 18 18 13   CREATININE 0.87 0.87 0.89 0.74  CALCIUM 9.6 9.0 9.1 8.8  MG  --   --  2.0 2.0  PHOS  --   --  2.7 2.6  PROT 6.5  --   --  6.0  ALBUMIN 3.5  --   --  2.7*  AST 74*  --   --  14  ALT 178*  --   --  52  ALKPHOS 81  --   --  65  BILITOT 1.8*  --  1.1 0.9  TRIG  --   --   --  121   Estimated Creatinine Clearance: 104.6 mL/min (by C-G formula based on Cr of 0.74).    Recent Labs  04/12/14 2018 04/13/14 0004 04/13/14 0602  GLUCAP 114* 101* 116*    Medical History: Past Medical History  Diagnosis Date  . Esophageal cancer 03/29/14   Insulin Requirements:  None  Current Nutrition:   NPO Clinimix E 5/15 @ 40 ml/hr 20% fat emulsion @ 10 ml/hr  IVF:  NS with KCL 40 mEq at 100 mL/hr  Central access: PICC placed 04/12/14 TPN start date: 04/12/14  ASSESSMENT                                                                                                          HPI:  58 y/o M with PMH of alcohol  abuse (sober for last 9 years), chronic back and neck pain, and esophageal cancer with complete esophagus obstruction causing dysphagia. Admitted from [facility] on 12/8 due to inability to eat or drink and dehydration. He is s/p open feeding jejunostomy tube on 12/9 and developed significant n/v after TF started. NGT inserted in IR 12/13 with immediate output. Pt developed fever, chills, tachycardia and hypoxia. CT of chest showed bilateral aspiration pneumonia, for which he is on broad spectrum antibiotics, Vancomycin and Zosyn. J tube became clogged, so patient ordered to be transitioned from tube feeds to TPN for nutrition, with pharmacy to assist with dosing.  12/16: Surgery MD states waiting for return of bowel function before restarting tube feeds  Today:    Glucose - CBGs at goal < 150  Electrolytes - K low at 3.0 (KCl runs x6 ordered and KCl added to IVF by surgery MD), all others WNL  Renal - SCr WNL, 0.74- stable  LFTs - AST/ALT elevated on 12/13, now all WNL  TGs - 121  Prealbumin - pending  NUTRITIONAL GOALS                                                                                             RD recs:  Kcal: 2000-2200 Protein: 105-115g Fluid: 2L/day  Clinimix E 5/15 at a goal rate of 90 ml/hr + 20% fat emulsion at 10 ml/hr to provide: 108 g/day protein, 2013 Kcal/day.  PLAN                                                                                                                         At 1800 today:  Increase Clinimix E 5/15 to 50 ml/hr.  Using conservative advancement of TPN toward goal rate due to risk of refeeding syndrome  20% fat emulsion at 10 ml/hr.  Plan to advance as tolerated to the goal rate. Will advance slowly due to risk of refeeding.  TPN to contain standard multivitamins and trace elements.  Decrease IVF to 75 ml/hr.  Continue sensitive Scale SSI and CBG checks q6h.   KCl replaced this AM per surgery.  TPN lab panels on Mondays &  Thursdays.  F/u daily.  Thank you for the consult.  Currie Paris, PharmD, BCPS Pager: [phone] Pharmacy: [phone] 04/13/2014 12:18 PM

## 2014-04-13 NOTE — Progress Notes (Signed)
[facility] PROGRESS NOTE  Billy Romero UUE:[id number] DOB: [birth year] DOA: 04/05/2014 PCP: Lujean Amel, MD  Admission history of present illness/brief narrative:   HPI: Billy Romero is a 58 y.o. 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 Admitted from [place] due to inability to eat or drink and dehydration. Patient reports that symptoms has been steadily progressing and worsening over the last week or two and currently having trouble with keeping down even his own saliva. Plan is for patient to be admitted for IVF's resuscitation and also placement of J tube for tube feedings and nutrition. Patient had Placement open feeding jejunostomy tube by Dr. Scherrie Merritts 12/9. He had significant nausea and vomiting after his tube feeds were started, CT abdomen with contrast showing evidence of narrowing at the insertion type, as well having a kink at the insertion site . Given patient continuous nausea and vomiting, he had nasogastric tube inserted via fluoroscopic guidance by radiology, patient had 3600 mL output within half an hour from tube insertion, patient developed fever, hypoxia, tachycardia, CT chest showing evidence of bilateral aspiration pneumonia. Patient is improving on IV vancomycin and Zosyn, unfortunately his J-tube remains clogged, so he is being started on TPN.      Assessment/Plan: Dysphagia, pharyngoesophageal phase:  -secondary to complete obstruction of esophagus due to cancer. -continue IVF's resuscitation -Placement open feeding jejunostomy tube by Dr. Scherrie Merritts 12/9 -electrolytes repletion as needed  Nausea and vomiting / Small bowel obstruction at J-tube insertion site./Probable ileus -Secondary to Jejunum narrowing, and kink at the insertion site, patient is  known to have history of congenital partial malrotation. - NG tube via fluoroscopic guidance inserted by radiology 12/13 , with significant improvement of symptoms . -UGI shows tube is not obstructing the lumen and thus possible ileus (patient has hypokalemia). Keep potassium greater than 4. Keep magnesium greater than 2. General surgery is following and appreciate input and recommendations.  Aspiration pneumonia  -Most likely related to copious amount of nausea and vomiting secondary to obstruction . -Continue with IV vancomycin and Zosyn 12/13  -No further hypoxia .  Esophagus cancer:  -Following with oncology as an outpatient -plan is for radiation , radiation oncology follow-up appreciated. -CT surgery follow-up is appreciated.  Chronic back pain: - Continue current pain regimen and muscle relaxants   Protein-calorie malnutrition, moderate: - due to decrease PO intake and cancer. -dietician has been consulted - Continue TPN Once ileus resolves may convert to entereal feeds through J-tube.  Hypokalemia -Replete. Will check potassium level this afternoon. Keep potassium greater than 4.   Code Status: Full Family Communication: Updated patient and wife at bedside. Disposition Plan: Home when medically stable.   Consultants:  Gen. surgery Dr.Gerkin 04/05/2014  Radiation oncology: Dr. Lisbeth Renshaw 04/06/2014  Procedures: -Placement open feeding jejunostomy tube by Dr. Scherrie Merritts 12/9 - NG tube inserted by radiology via fluoroscopic guidance 04/10/14 CT abdomen and pelvis 04/08/2014 CT chest 04/10/2014 Abdominal x-ray 04/08/2014, 04/12/2014 Water-soluble upper GI series 04/11/2014    Antibiotics:  IV vancomycin 04/10/2014  IV Zosyn 04/10/2014  HPI/Subjective: Patient denies any emesis. Patient states as long as NG tube is in place no further nausea or emesis. Some improvement in abdominal pain. No bowel movement. No flatus.  Objective: Filed Vitals:   04/13/14  0625  BP: 151/78  Pulse: 81  Temp: 98.6 F (37 C)  Resp: 16    Intake/Output Summary (Last 24 hours) at 04/13/14 1402 Last data filed at 04/13/14 1000  Gross per 24 hour  Intake 3877.67 ml  Output   2775 ml  Net 1102.67 ml   Filed Weights   04/12/14 0600 04/12/14 1444 04/13/14 0625  Weight: 79.379 kg (175 lb) 79.379 kg (175 lb) 81.194 kg (179 lb)    Exam:   General:  NAD. NG tube in place.  Cardiovascular: RRR  Respiratory: CTAB  Abdomen: Soft, J-tube in place. Nondistended. Positive bowel sounds.  Musculoskeletal: No clubbing cyanosis or edema.  Data Reviewed: Basic Metabolic Panel:  Recent Labs Lab 04/09/14 0530 04/10/14 1834 04/11/14 0330 04/12/14 0343 04/13/14 0600  NA 140 141 140 139 137  K 3.4* 3.2* 3.3* 3.2* 3.0*  CL 102 102 104 102 99  CO2 24 25 24 27 27   GLUCOSE 139* 154* 144* 109* 126*  BUN 14 18 18 18 13   CREATININE 0.88 0.87 0.87 0.89 0.74  CALCIUM 9.3 9.6 9.0 9.1 8.8  MG  --   --   --  2.0 2.0  PHOS  --   --   --  2.7 2.6   Liver Function Tests:  Recent Labs Lab 04/10/14 1834 04/12/14 0343 04/13/14 0600  AST 74*  --  14  ALT 178*  --  52  ALKPHOS 81  --  65  BILITOT 1.8* 1.1 0.9  PROT 6.5  --  6.0  ALBUMIN 3.5  --  2.7*   No results for input(s): LIPASE, AMYLASE in the last 168 hours. No results for input(s): AMMONIA in the last 168 hours. CBC:  Recent Labs Lab 04/09/14 0530 04/10/14 1834 04/11/14 0330 04/12/14 0343 04/13/14 0600  WBC 12.0* 7.4 11.4* 10.5 11.4*  NEUTROABS  --   --   --   --  9.4*  HGB 14.5 15.6 14.5 13.3 12.6*  HCT 42.1 45.5 41.8 39.4 36.8*  MCV 85.2 84.9 84.6 86.0 84.4  PLT 188 183 163 152 182   Cardiac Enzymes: No results for input(s): CKTOTAL, CKMB, CKMBINDEX, TROPONINI in the last 168 hours. BNP (last 3 results) No results for input(s): PROBNP in the last 8760 hours. CBG:  Recent Labs Lab 04/12/14 1844 04/12/14 2018 04/13/14 0004 04/13/14 0602 04/13/14 1224  GLUCAP 100* 114* 101* 116*  119*    Recent Results (from the past 240 hour(s))  Surgical pcr screen     Status: None   Collection Time: 04/06/14 12:00 PM  Result Value Ref Range Status   MRSA, PCR NEGATIVE NEGATIVE Final   Staphylococcus aureus NEGATIVE NEGATIVE Final    Comment:        The Xpert SA Assay (FDA approved for NASAL specimens in patients over 61 years of age), is one component of a comprehensive surveillance program.  Test performance has been validated by [employer] for patients greater than or equal to 72 year old. It is not intended to diagnose infection nor to guide or monitor treatment.   Culture, blood (routine x 2)     Status: None (Preliminary result)   Collection Time: 04/10/14  6:05 PM  Result Value Ref Range Status   Specimen Description BLOOD LEFT HAND  Final   Special Requests BOTTLES DRAWN AEROBIC AND ANAEROBIC 5CC  Final   Culture  Setup Time   Final    04/11/2014 03:11 Performed at [facility]    Culture   Final  BLOOD CULTURE RECEIVED NO GROWTH TO DATE CULTURE WILL BE HELD FOR 5 DAYS BEFORE ISSUING A FINAL NEGATIVE REPORT Performed at [facility]    Report Status PENDING  Incomplete  Culture, blood (routine x 2)     Status: None (Preliminary result)   Collection Time: 04/10/14  6:27 PM  Result Value Ref Range Status   Specimen Description BLOOD LEFT ARM  Final   Special Requests BOTTLES DRAWN AEROBIC AND ANAEROBIC 10CC  Final   Culture  Setup Time   Final    04/11/2014 03:11 Performed at [facility]    Culture   Final           BLOOD CULTURE RECEIVED NO GROWTH TO DATE CULTURE WILL BE HELD FOR 5 DAYS BEFORE ISSUING A FINAL NEGATIVE REPORT Note: Culture results may be compromised due to an excessive volume of blood received in culture bottles. Performed at [facility]    Report Status PENDING  Incomplete  MRSA PCR Screening     Status: None   Collection Time: 04/10/14 10:02 PM  Result Value Ref Range Status   MRSA by  PCR NEGATIVE NEGATIVE Final    Comment:        The GeneXpert MRSA Assay (FDA approved for NASAL specimens only), is one component of a comprehensive MRSA colonization surveillance program. It is not intended to diagnose MRSA infection nor to guide or monitor treatment for MRSA infections.      Studies: Dg Abd 1 View  04/12/2014   CLINICAL DATA:  Nonfunctioning jejunostomy tube.  EXAM: ABDOMEN - 1 VIEW  COMPARISON:  04/11/2014  FINDINGS: The jejunostomy tube is unchanged in position. It was demonstrated to be within the jejunum during the upper GI dated 04/11/2014. NG tube is coiled in the fundus of the stomach.  Bowel gas pattern is normal. Contrast from the previous upper GI is now in the nondistended colon. There are no visible dilated loops of bowel.  IMPRESSION: Tubes appear in good position. No dilated large or small bowel. Contrast has passed from the small bowel into the colon.   Electronically Signed   By: Rozetta Nunnery M.D.   On: 04/12/2014 11:11    Scheduled Meds: . antiseptic oral rinse  7 mL Mouth Rinse q12n4p  . chlorhexidine  15 mL Mouth Rinse BID  . enoxaparin (LOVENOX) injection  40 mg Subcutaneous Q24H  . insulin aspart  0-9 Units Subcutaneous 4 times per day  . LORazepam  1 mg Intravenous Once  . pantoprazole (PROTONIX) IV  40 mg Intravenous Q12H  . piperacillin-tazobactam (ZOSYN)  IV  3.375 g Intravenous Q8H  . potassium chloride  10 mEq Intravenous Q1 Hr x 6  . sodium chloride  10-40 mL Intracatheter Q12H  . vancomycin  1,250 mg Intravenous Q8H   Continuous Infusions: . Marland KitchenTPN (CLINIMIX-E) Adult 40 mL/hr at 04/12/14 1739   And  . fat emulsion 250 mL (04/12/14 1739)  . Marland KitchenTPN (CLINIMIX-E) Adult     And  . fat emulsion    . sodium chloride 0.9 % 1,000 mL with potassium chloride 40 mEq infusion 100 mL/hr at 04/13/14 1017  . sodium chloride 0.9 % 1,000 mL with potassium chloride 40 mEq infusion      Principal Problem:   Dysphagia, pharyngoesophageal  phase Active Problems:   Esophagus cancer   Chronic back pain   Protein-calorie malnutrition, severe   Dysphagia   Malnutrition of moderate degree   Esophageal carcinoma    Time spent:  27 minutes    Fonnie Crookshanks M.D. [facility] Pager [phone]. If 7PM-7AM, please contact night-coverage at [email or web address], password [facility] 04/13/2014, 2:02 PM  LOS: 8 days

## 2014-04-13 NOTE — Progress Notes (Signed)
ANTIBIOTIC CONSULT NOTE  Pharmacy Consult for Vancomycin and Zosyn Indication: rule out sepsis  No Known Allergies  Patient Measurements: Height: 5\' 11"  (180.3 cm) Weight: 179 lb (81.194 kg) IBW/kg (Calculated) : 75.3  Assessment: 58 yo male with hx alcohol abuse (sober for last 9 years), chronic back and neck pain and esophageal cancer with complete esophagus obstruction causing dysphagia. Admitted from [facility] on 12/8 due to inability to eat or drink and dehydration. He is s/p open feeding jejunostomy tube on 12/9 and developed significant n/v after TF started. NGT inserted in IR 12/13 with immediate output. Pt developed fever, chills, tachycardia and hypoxia. CT ordered to r/o perforation and pharmacy consulted to dose vancomycin and zosyn for sepsis.  Antiinfectives 12/13 >> Vancomycin >> 12/13 >> Zosyn >>  Labs / vitals Tmax: 99.8 WBC: 11.4, increased Renal: SCr 0.74- improved, Cl~100 CG/N (SCr of 0.8)  Microbiology 12/13 blood x 2: ngtd 12/15 urine: pending 12/9, 12/13 MRSA PCR: negative  Levels / dose changes: 12/15 1300 VT: 9.2 on 1gm q8, dose increased  Goal of Therapy:  Vancomycin trough level 15-20 mcg/ml  Zosyn dosage per indication, renal function  Plan:  Continue Zosyn 3.375gm IV q8h (4hr extended infusions) Continue vancomycin at 1250mg  IV q8h Recheck trough tomorrow am with q8h dosing Follow up renal function & cultures, clinical course  Thank you for the consult.  Currie Paris, PharmD, BCPS Pager: [phone] Pharmacy: [phone] 04/13/2014 1:20 PM

## 2014-04-14 ENCOUNTER — Ambulatory Visit (HOSPITAL_COMMUNITY): Payer: BC Managed Care – PPO

## 2014-04-14 ENCOUNTER — Encounter: Payer: Self-pay | Admitting: *Deleted

## 2014-04-14 ENCOUNTER — Other Ambulatory Visit: Payer: BC Managed Care – PPO

## 2014-04-14 LAB — GLUCOSE, CAPILLARY
GLUCOSE-CAPILLARY: 109 mg/dL — AB (ref 70–99)
GLUCOSE-CAPILLARY: 133 mg/dL — AB (ref 70–99)
Glucose-Capillary: 107 mg/dL — ABNORMAL HIGH (ref 70–99)
Glucose-Capillary: 112 mg/dL — ABNORMAL HIGH (ref 70–99)

## 2014-04-14 LAB — CBC
HCT: 38.6 % — ABNORMAL LOW (ref 39.0–52.0)
Hemoglobin: 13.3 g/dL (ref 13.0–17.0)
MCH: 29.3 pg (ref 26.0–34.0)
MCHC: 34.5 g/dL (ref 30.0–36.0)
MCV: 85 fL (ref 78.0–100.0)
PLATELETS: 209 10*3/uL (ref 150–400)
RBC: 4.54 MIL/uL (ref 4.22–5.81)
RDW: 12.7 % (ref 11.5–15.5)
WBC: 9.5 10*3/uL (ref 4.0–10.5)

## 2014-04-14 LAB — COMPREHENSIVE METABOLIC PANEL
ALBUMIN: 2.6 g/dL — AB (ref 3.5–5.2)
ALT: 56 U/L — ABNORMAL HIGH (ref 0–53)
ANION GAP: 11 (ref 5–15)
AST: 31 U/L (ref 0–37)
Alkaline Phosphatase: 76 U/L (ref 39–117)
BILIRUBIN TOTAL: 1.4 mg/dL — AB (ref 0.3–1.2)
BUN: 9 mg/dL (ref 6–23)
CHLORIDE: 97 meq/L (ref 96–112)
CO2: 25 mEq/L (ref 19–32)
Calcium: 8.8 mg/dL (ref 8.4–10.5)
Creatinine, Ser: 0.75 mg/dL (ref 0.50–1.35)
GFR calc Af Amer: >90 mL/min (ref >90)
GFR calc non Af Amer: >90 mL/min (ref >90)
GLUCOSE: 137 mg/dL — AB (ref 70–99)
Potassium: 3.5 mEq/L — ABNORMAL LOW (ref 3.7–5.3)
Sodium: 133 mEq/L — ABNORMAL LOW (ref 137–147)
TOTAL PROTEIN: 6.2 g/dL (ref 6.0–8.3)

## 2014-04-14 LAB — MAGNESIUM: Magnesium: 2.1 mg/dL (ref 1.5–2.5)

## 2014-04-14 LAB — VANCOMYCIN, TROUGH: Vancomycin Tr: 12.9 ug/mL (ref 10.0–20.0)

## 2014-04-14 LAB — PHOSPHORUS: Phosphorus: 2.9 mg/dL (ref 2.3–4.6)

## 2014-04-14 MED ORDER — POTASSIUM CHLORIDE 10 MEQ/50ML IV SOLN
10.0000 meq | INTRAVENOUS | Status: AC
  Administered 2014-04-14 (×6): 10 meq via INTRAVENOUS
  Filled 2014-04-14 (×6): qty 50

## 2014-04-14 MED ORDER — SODIUM CHLORIDE 0.9 % IV SOLN
INTRAVENOUS | Status: AC
  Filled 2014-04-14 (×2): qty 1000

## 2014-04-14 MED ORDER — FAT EMULSION 20 % IV EMUL
250.0000 mL | INTRAVENOUS | Status: AC
  Administered 2014-04-14: 250 mL via INTRAVENOUS
  Filled 2014-04-14: qty 250

## 2014-04-14 MED ORDER — TRACE MINERALS CR-CU-F-FE-I-MN-MO-SE-ZN IV SOLN
INTRAVENOUS | Status: AC
  Administered 2014-04-14: 17:00:00 via INTRAVENOUS
  Filled 2014-04-14: qty 2000

## 2014-04-14 NOTE — Progress Notes (Signed)
8 Days Post-Op  Subjective: NG suction continuous with oral suction also attached.  Fluid in sump arm of the NG.  I change that.  They need to have a second suction set up.    Objective: Vital signs in last 24 hours: Temp:  [97.4 F (36.3 C)-98.7 F (37.1 C)] 97.8 F (36.6 C) (12/17 0640) Pulse Rate:  [74-77] 77 (12/17 0640) Resp:  [16] 16 (12/17 0640) BP: (146-152)/(79-81) 152/79 mmHg (12/17 0640) SpO2:  [95 %-96 %] 96 % (12/17 0640) Weight:  [80.513 kg (177 lb 8 oz)] 80.513 kg (177 lb 8 oz) (12/17 0640) Last BM Date: 04/07/14 1725 from the NG yesterday Afebrile, VSS K+ 3.5 WBC normal Intake/Output from previous day: 12/16 0701 - 12/17 0700 In: -  Out: 3225 [Urine:1500; Emesis/NG output:1725] Intake/Output this shift: Total I/O In: -  Out: 375 [Emesis/NG output:375]  General appearance: alert, cooperative, no distress and starting to have nausea Resp: clear to auscultation bilaterally and anterior GI: soft, still a little sore.  incision looks fine, and i irrigated the j tube it flows well.  Lab Results:   Recent Labs  04/13/14 0600 04/14/14 0800  WBC 11.4* 9.5  HGB 12.6* 13.3  HCT 36.8* 38.6*  PLT 182 209    BMET  Recent Labs  04/13/14 1512 04/14/14 0800  NA 135* 133*  K 3.5* 3.5*  CL 98 97  CO2 25 25  GLUCOSE 123* 137*  BUN 11 9  CREATININE 0.76 0.75  CALCIUM 8.9 8.8   PT/INR No results for input(s): LABPROT, INR in the last 72 hours.   Recent Labs Lab 04/10/14 1834 04/12/14 0343 04/13/14 0600 04/14/14 0800  AST 74*  --  14 31  ALT 178*  --  52 56*  ALKPHOS 81  --  65 76  BILITOT 1.8* 1.1 0.9 1.4*  PROT 6.5  --  6.0 6.2  ALBUMIN 3.5  --  2.7* 2.6*     Lipase  No results found for: LIPASE   Studies/Results: Dg Abd 1 View  04/12/2014   CLINICAL DATA:  Nonfunctioning jejunostomy tube.  EXAM: ABDOMEN - 1 VIEW  COMPARISON:  04/11/2014  FINDINGS: The jejunostomy tube is unchanged in position. It was demonstrated to be within the jejunum  during the upper GI dated 04/11/2014. NG tube is coiled in the fundus of the stomach.  Bowel gas pattern is normal. Contrast from the previous upper GI is now in the nondistended colon. There are no visible dilated loops of bowel.  IMPRESSION: Tubes appear in good position. No dilated large or small bowel. Contrast has passed from the small bowel into the colon.   Electronically Signed   By: Rozetta Nunnery M.D.   On: 04/12/2014 11:11    Medications: . antiseptic oral rinse  7 mL Mouth Rinse q12n4p  . chlorhexidine  15 mL Mouth Rinse BID  . enoxaparin (LOVENOX) injection  40 mg Subcutaneous Q24H  . insulin aspart  0-9 Units Subcutaneous 4 times per day  . LORazepam  1 mg Intravenous Once  . pantoprazole (PROTONIX) IV  40 mg Intravenous Q12H  . piperacillin-tazobactam (ZOSYN)  IV  3.375 g Intravenous Q8H  . potassium chloride  10 mEq Intravenous Q1 Hr x 6  . sodium chloride  10-40 mL Intracatheter Q12H  . vancomycin  1,250 mg Intravenous Q8H   . Marland KitchenTPN (CLINIMIX-E) Adult 50 mL/hr at 04/13/14 1812   And  . fat emulsion 250 mL (04/13/14 1811)  . sodium chloride 0.9 % 1,000  mL with potassium chloride 40 mEq infusion 75 mL/hr at 04/13/14 1801    Assessment/Plan 1. Esophageal cancer with complete obstruction  Placement open feeding jejunostomy tube, 04/06/2014, Armandina Gemma, MD   IR opened tube with wire12/15/15  IR NG tube placement 04/10/14  4 liters reportedly drained 2. post op nausea, vomiting and J tube obstruction 3.  Post op pneumonia, most likely from aspiration 3. Chronic back and neck pain 4. Malnutrition    Plan: I have fixed the NG for now.  You cannot have both the oral suction and the NG to the same cannister and regulator.  He needs the j-tube irrigated and we might consider trickle feeds.  See how he does.  Continue TNA.    LOS: 9 days    Billy Romero 04/14/2014

## 2014-04-14 NOTE — Progress Notes (Signed)
[facility] PROGRESS NOTE  Billy Romero WSF:[id number] DOB: May 26, 1955 DOA: 04/05/2014 PCP: Lujean Amel, MD  Admission history of present illness/brief narrative:   HPI: Billy Romero is a 58 y.o. 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 Admitted from [place] due to inability to eat or drink and dehydration. Patient reports that symptoms has been steadily progressing and worsening over the last week or two and currently having trouble with keeping down even his own saliva. Plan is for patient to be admitted for IVF's resuscitation and also placement of J tube for tube feedings and nutrition. Patient had Placement open feeding jejunostomy tube by Dr. Scherrie Merritts 12/9. He had significant nausea and vomiting after his tube feeds were started, CT abdomen with contrast showing evidence of narrowing at the insertion type, as well having a kink at the insertion site . Given patient continuous nausea and vomiting, he had nasogastric tube inserted via fluoroscopic guidance by radiology, patient had 3600 mL output within half an hour from tube insertion, patient developed fever, hypoxia, tachycardia, CT chest showing evidence of bilateral aspiration pneumonia. Patient is improving on IV vancomycin and Zosyn, unfortunately his J-tube remains clogged, so he is being started on TPN.      Assessment/Plan: Dysphagia, pharyngoesophageal phase:  -secondary to complete obstruction of esophagus due to cancer. -continue IVF's resuscitation -Placement open feeding jejunostomy tube by Dr. Scherrie Merritts 12/9 -electrolytes repletion as needed  Nausea and vomiting / Small bowel obstruction at J-tube insertion site./Probable ileus -Secondary to Jejunum narrowing, and kink at the insertion site, patient is  known to have history of congenital partial malrotation. - NG tube via fluoroscopic guidance inserted by radiology 12/13 , with significant improvement of symptoms . -UGI shows tube is not obstructing the lumen and thus possible ileus (patient has hypokalemia). Keep potassium greater than 4. Keep magnesium greater than 2. General surgery is following and appreciate input and recommendations.  Aspiration pneumonia  -Most likely related to copious amount of nausea and vomiting secondary to obstruction . -Continue with IV vancomycin and Zosyn 12/13  -No further hypoxia .  Esophagus cancer:  -Following with oncology as an outpatient -plan is for radiation , radiation oncology follow-up appreciated. -CT surgery follow-up is appreciated.  Chronic back pain: - Continue current pain regimen and muscle relaxants   Protein-calorie malnutrition, moderate: - due to decrease PO intake and cancer. -dietician has been consulted - Continue TPN Once ileus resolves may convert to entereal feeds through J-tube.  Hypokalemia -Replete. Keep potassium greater than 4.   Code Status: Full Family Communication: Updated patient and wife at bedside. Disposition Plan: Home when medically stable.   Consultants:  Gen. surgery Dr.Gerkin 04/05/2014  Radiation oncology: Dr. Lisbeth Renshaw 04/06/2014  Procedures: -Placement open feeding jejunostomy tube by Dr. Scherrie Merritts 12/9 - NG tube inserted by radiology via fluoroscopic guidance 04/10/14 CT abdomen and pelvis 04/08/2014 CT chest 04/10/2014 Abdominal x-ray 04/08/2014, 04/12/2014 Water-soluble upper GI series 04/11/2014    Antibiotics:  IV vancomycin 04/10/2014  IV Zosyn 04/10/2014  HPI/Subjective: Patient denies any emesis. Patient states as long as NG tube is in place no further nausea or emesis. Some improvement in abdominal pain. No bowel movement. No flatus.  Objective: Filed Vitals:   04/14/14 1354  BP: 150/85  Pulse: 86  Temp: 98.6 F  (37 C)  Resp: 16    Intake/Output Summary (Last 24 hours) at 04/14/14 1805 Last data filed at 04/14/14 1354  Gross per 24 hour  Intake      0 ml  Output   2325 ml  Net  -2325 ml   Filed Weights   04/12/14 1444 04/13/14 0625 04/14/14 0640  Weight: 79.379 kg (175 lb) 81.194 kg (179 lb) 80.513 kg (177 lb 8 oz)    Exam:   General:  NAD. NG tube in place.  Cardiovascular: RRR  Respiratory: CTAB  Abdomen: Soft, J-tube in place. Nondistended. Positive bowel sounds.  Musculoskeletal: No clubbing cyanosis or edema.  Data Reviewed: Basic Metabolic Panel:  Recent Labs Lab 04/11/14 0330 04/12/14 0343 04/13/14 0600 04/13/14 1512 04/14/14 0800  NA 140 139 137 135* 133*  K 3.3* 3.2* 3.0* 3.5* 3.5*  CL 104 102 99 98 97  CO2 24 27 27 25 25   GLUCOSE 144* 109* 126* 123* 137*  BUN 18 18 13 11 9   CREATININE 0.87 0.89 0.74 0.76 0.75  CALCIUM 9.0 9.1 8.8 8.9 8.8  MG  --  2.0 2.0  --  2.1  PHOS  --  2.7 2.6  --  2.9   Liver Function Tests:  Recent Labs Lab 04/10/14 1834 04/12/14 0343 04/13/14 0600 04/14/14 0800  AST 74*  --  14 31  ALT 178*  --  52 56*  ALKPHOS 81  --  65 76  BILITOT 1.8* 1.1 0.9 1.4*  PROT 6.5  --  6.0 6.2  ALBUMIN 3.5  --  2.7* 2.6*   No results for input(s): LIPASE, AMYLASE in the last 168 hours. No results for input(s): AMMONIA in the last 168 hours. CBC:  Recent Labs Lab 04/10/14 1834 04/11/14 0330 04/12/14 0343 04/13/14 0600 04/14/14 0800  WBC 7.4 11.4* 10.5 11.4* 9.5  NEUTROABS  --   --   --  9.4*  --   HGB 15.6 14.5 13.3 12.6* 13.3  HCT 45.5 41.8 39.4 36.8* 38.6*  MCV 84.9 84.6 86.0 84.4 85.0  PLT 183 163 152 182 209   Cardiac Enzymes: No results for input(s): CKTOTAL, CKMB, CKMBINDEX, TROPONINI in the last 168 hours. BNP (last 3 results) No results for input(s): PROBNP in the last 8760 hours. CBG:  Recent Labs Lab 04/13/14 1224 04/13/14 1759 04/14/14 0008 04/14/14 0633 04/14/14 1359  GLUCAP 119* 111* 112* 109* 133*     Recent Results (from the past 240 hour(s))  Surgical pcr screen     Status: None   Collection Time: 04/06/14 12:00 PM  Result Value Ref Range Status   MRSA, PCR NEGATIVE NEGATIVE Final   Staphylococcus aureus NEGATIVE NEGATIVE Final    Comment:        The Xpert SA Assay (FDA approved for NASAL specimens in patients over 45 years of age), is one component of a comprehensive surveillance program.  Test performance has been validated by [employer] for patients greater than or equal to 73 year old. It is not intended to diagnose infection nor to guide or monitor treatment.   Culture, blood (routine x 2)     Status: None (Preliminary result)   Collection Time: 04/10/14  6:05 PM  Result Value Ref Range Status   Specimen Description BLOOD LEFT HAND  Final   Special Requests BOTTLES DRAWN AEROBIC AND ANAEROBIC 5CC  Final   Culture  Setup Time   Final    04/11/2014 03:11 Performed at [employer] [facility]  Final           BLOOD CULTURE RECEIVED NO GROWTH TO DATE CULTURE WILL BE HELD FOR 5 DAYS BEFORE ISSUING A FINAL NEGATIVE REPORT Performed at [facility]    Report Status PENDING  Incomplete  Culture, blood (routine x 2)     Status: None (Preliminary result)   Collection Time: 04/10/14  6:27 PM  Result Value Ref Range Status   Specimen Description BLOOD LEFT ARM  Final   Special Requests BOTTLES DRAWN AEROBIC AND ANAEROBIC 10CC  Final   Culture  Setup Time   Final    04/11/2014 03:11 Performed at [facility]    Culture   Final           BLOOD CULTURE RECEIVED NO GROWTH TO DATE CULTURE WILL BE HELD FOR 5 DAYS BEFORE ISSUING A FINAL NEGATIVE REPORT Note: Culture results may be compromised due to an excessive volume of blood received in culture bottles. Performed at [facility]    Report Status PENDING  Incomplete  MRSA PCR Screening     Status: None   Collection Time: 04/10/14 10:02 PM  Result Value Ref Range Status   MRSA by PCR  NEGATIVE NEGATIVE Final    Comment:        The GeneXpert MRSA Assay (FDA approved for NASAL specimens only), is one component of a comprehensive MRSA colonization surveillance program. It is not intended to diagnose MRSA infection nor to guide or monitor treatment for MRSA infections.   Culture, Urine     Status: None   Collection Time: 04/12/14  2:08 AM  Result Value Ref Range Status   Specimen Description URINE, CLEAN CATCH  Final   Special Requests none Normal  Final   Culture  Setup Time   Final    04/12/2014 11:21 Performed at [place] Performed at [facility]   Final   Culture NO GROWTH Performed at [facility]   Final   Report Status 04/13/2014 FINAL  Final     Studies: No results found.  Scheduled Meds: . antiseptic oral rinse  7 mL Mouth Rinse q12n4p  . chlorhexidine  15 mL Mouth Rinse BID  . enoxaparin (LOVENOX) injection  40 mg Subcutaneous Q24H  . insulin aspart  0-9 Units Subcutaneous 4 times per day  . LORazepam  1 mg Intravenous Once  . pantoprazole (PROTONIX) IV  40 mg Intravenous Q12H  . piperacillin-tazobactam (ZOSYN)  IV  3.375 g Intravenous Q8H  . sodium chloride  10-40 mL Intracatheter Q12H  . vancomycin  1,250 mg Intravenous Q8H   Continuous Infusions: . Marland KitchenTPN (CLINIMIX-E) Adult 65 mL/hr at 04/14/14 1728   And  . fat emulsion 250 mL (04/14/14 1727)  . sodium chloride 0.9 % 1,000 mL with potassium chloride 40 mEq infusion      Principal Problem:   Dysphagia, pharyngoesophageal phase Active Problems:   Esophagus cancer   Chronic back pain   Protein-calorie malnutrition, severe   Dysphagia   Malnutrition of moderate degree   Esophageal carcinoma   SBO (small bowel obstruction)    Time spent: 62 minutes    Shelbia Scinto M.D. [facility] Pager [phone]. If 7PM-7AM, please contact night-coverage at [email or web address], password [facility] 04/14/2014, 6:05 PM  LOS: 9 days

## 2014-04-14 NOTE — Progress Notes (Signed)
ANTIBIOTIC CONSULT NOTE  Pharmacy Consult for Vancomycin and Zosyn Indication: rule out sepsis  No Known Allergies  Patient Measurements: Height: 5\' 11"  (180.3 cm) Weight: 177 lb 8 oz (80.513 kg) IBW/kg (Calculated) : 75.3  Assessment: 58 yo male with hx alcohol abuse (sober for last 9 years), chronic back and neck pain and esophageal cancer with complete esophagus obstruction causing dysphagia. Admitted from [facility] on 12/8 due to inability to eat or drink and dehydration. He is s/p open feeding jejunostomy tube on 12/9 and developed significant n/v after TF started. NGT inserted in IR 12/13 with immediate output. Pt developed fever, chills, tachycardia and hypoxia. CT ordered to r/o perforation and pharmacy consulted to dose vancomycin and zosyn for sepsis.  Antiinfectives 12/13 >> Vancomycin >> 12/13 >> Zosyn >>  Labs / vitals Tmax: afebrile WBC: improved to WNL Renal: SCr 0.75- stable, Cl~100 CG/N (SCr of 0.8)  Microbiology 12/13 blood x 2: ngtd 12/15 urine: NGF 12/9, 12/13 MRSA PCR: negative  Levels / dose changes: 12/15 1300 VT: 9.2 on 1gm q8, increase dose 12/17 1335 VT: 12.1 on 1250mg  q8h  Goal of Therapy:  Vancomycin trough level 15-20 mcg/ml  Zosyn dosage per indication, renal function  Plan:  Continue Zosyn 3.375gm IV q8h (4hr extended infusions) Continue vancomycin at 1250mg  IV q8h  Will not increase dose due to risk of accumulation Follow up renal function & cultures, clinical course  Thank you for the consult.  Currie Paris, PharmD, BCPS Pager: [phone] Pharmacy: [phone] 04/14/2014 2:11 PM

## 2014-04-14 NOTE — Progress Notes (Signed)
PARENTERAL NUTRITION CONSULT NOTE  Pharmacy Consult for TPN Indication: Intolerance to enteral feeding  No Known Allergies  Patient Measurements: Height: 5\' 11"  (180.3 cm) Weight: 177 lb 8 oz (80.513 kg) IBW/kg (Calculated) : 75.3 Adjusted Body Weight: 76.5 kg  Vital Signs: Temp: 97.8 F (36.6 C) (12/17 0640) Temp Source: Oral (12/17 0640) BP: 152/79 mmHg (12/17 0640) Pulse Rate: 77 (12/17 0640) Intake/Output from previous day: 12/16 0701 - 12/17 0700 In: -  Out: 3225 [Urine:1500; Emesis/NG output:1725] Intake/Output from this shift: Total I/O In: -  Out: 375 [Emesis/NG output:375]  Labs:  Recent Labs  04/12/14 0343 04/13/14 0600 04/14/14 0800  WBC 10.5 11.4* 9.5  HGB 13.3 12.6* 13.3  HCT 39.4 36.8* 38.6*  PLT 152 182 209     Recent Labs  04/12/14 0343 04/13/14 0600 04/13/14 1512 04/14/14 0800  NA 139 137 135* 133*  K 3.2* 3.0* 3.5* 3.5*  CL 102 99 98 97  CO2 27 27 25 25   GLUCOSE 109* 126* 123* 137*  BUN 18 13 11 9   CREATININE 0.89 0.74 0.76 0.75  CALCIUM 9.1 8.8 8.9 8.8  MG 2.0 2.0  --  2.1  PHOS 2.7 2.6  --  2.9  PROT  --  6.0  --  6.2  ALBUMIN  --  2.7*  --  2.6*  AST  --  14  --  31  ALT  --  52  --  56*  ALKPHOS  --  65  --  76  BILITOT 1.1 0.9  --  1.4*  PREALBUMIN  --  6.5*  --   --   TRIG  --  121  --   --    Estimated Creatinine Clearance: 104.6 mL/min (by C-G formula based on Cr of 0.75).    Recent Labs  04/13/14 1759 04/14/14 0008 04/14/14 0633  GLUCAP 111* 112* 109*    Medical History: Past Medical History  Diagnosis Date  . Esophageal cancer 03/29/14   Insulin Requirements:  None  Current Nutrition:   NPO Clinimix E 5/15 @ 50 ml/hr 20% fat emulsion @ 10 ml/hr  IVF:  NS with KCL 40 mEq at 75 mL/hr  Central access: PICC placed 04/12/14 TPN start date: 04/12/14  ASSESSMENT                                                                                                          HPI:  58 y/o M with PMH of alcohol  abuse (sober for last 9 years), chronic back and neck pain, and esophageal cancer with complete esophagus obstruction causing dysphagia. Admitted from [facility] on 12/8 due to inability to eat or drink and dehydration. He is s/p open feeding jejunostomy tube on 12/9 and developed significant n/v after TF started. NGT inserted in IR 12/13 with immediate output. Pt developed fever, chills, tachycardia and hypoxia. CT of chest showed bilateral aspiration pneumonia, for which he is on broad spectrum antibiotics, Vancomycin and Zosyn. J tube became clogged, so patient ordered to be transitioned from tube feeds to TPN for nutrition,  with pharmacy to assist with dosing.    12/16: Surgery MD states waiting for return of bowel function before restarting tube feeds  Today:    Glucose - CBGs at goal < 150  Electrolytes - K low at 3.5, Na now low at 133 (can not adjust in pre-made Clinimix) all others WNL  Renal - SCr WNL, 0.75- stable  LFTs - AST/ALT with very slight elevation  TGs - 121  Prealbumin - 6.5 (12/16)  NUTRITIONAL GOALS                                                                                             RD recs:  Kcal: 2000-2200 Protein: 105-115g Fluid: 2L/day  Clinimix E 5/15 at a goal rate of 90 ml/hr + 20% fat emulsion at 10 ml/hr to provide: 108 g/day protein, 2013 Kcal/day.  PLAN                                                                                                                          KCl replacement with 10 mEq runs x6  At 1800 today:  Increase Clinimix E 5/15 to 65 ml/hr.         Using conservative advancement of TPN toward goal rate due to risk of refeeding syndrome  20% fat emulsion at 10 ml/hr.  Plan to advance as tolerated to the goal rate. Will advance slowly due to risk of refeeding.  TPN to contain standard multivitamins and trace elements.  Decrease IVF to 60 ml/hr.  Continue sensitive Scale SSI and CBG checks q6h.   TPN lab panels on  Mondays & Thursdays.  Bmet, Mag, and Phos in AM  F/u daily.  Thank you for the consult.  Currie Paris, PharmD, BCPS Pager: [phone] Pharmacy: [phone] 04/14/2014 11:42 AM

## 2014-04-15 DIAGNOSIS — K567 Ileus, unspecified: Secondary | ICD-10-CM | POA: Diagnosis not present

## 2014-04-15 DIAGNOSIS — E440 Moderate protein-calorie malnutrition: Secondary | ICD-10-CM

## 2014-04-15 DIAGNOSIS — K9189 Other postprocedural complications and disorders of digestive system: Secondary | ICD-10-CM

## 2014-04-15 DIAGNOSIS — K913 Postprocedural intestinal obstruction: Secondary | ICD-10-CM

## 2014-04-15 LAB — BASIC METABOLIC PANEL
Anion gap: 12 (ref 5–15)
BUN: 10 mg/dL (ref 6–23)
CO2: 25 mEq/L (ref 19–32)
Calcium: 9.1 mg/dL (ref 8.4–10.5)
Chloride: 98 mEq/L (ref 96–112)
Creatinine, Ser: 0.8 mg/dL (ref 0.50–1.35)
Glucose, Bld: 133 mg/dL — ABNORMAL HIGH (ref 70–99)
POTASSIUM: 3.7 meq/L (ref 3.7–5.3)
Sodium: 135 mEq/L — ABNORMAL LOW (ref 137–147)

## 2014-04-15 LAB — GLUCOSE, CAPILLARY
GLUCOSE-CAPILLARY: 137 mg/dL — AB (ref 70–99)
Glucose-Capillary: 126 mg/dL — ABNORMAL HIGH (ref 70–99)
Glucose-Capillary: 126 mg/dL — ABNORMAL HIGH (ref 70–99)
Glucose-Capillary: 122 mg/dL — ABNORMAL HIGH (ref 70–99)

## 2014-04-15 LAB — MAGNESIUM: Magnesium: 2.1 mg/dL (ref 1.5–2.5)

## 2014-04-15 LAB — PHOSPHORUS: Phosphorus: 3.8 mg/dL (ref 2.3–4.6)

## 2014-04-15 MED ORDER — FAT EMULSION 20 % IV EMUL
250.0000 mL | INTRAVENOUS | Status: AC
  Administered 2014-04-15: 250 mL via INTRAVENOUS
  Filled 2014-04-15: qty 250

## 2014-04-15 MED ORDER — OSMOLITE 1.5 CAL PO LIQD
1000.0000 mL | ORAL | Status: DC
  Administered 2014-04-15: 1000 mL
  Filled 2014-04-15 (×2): qty 1000

## 2014-04-15 MED ORDER — POTASSIUM CHLORIDE 10 MEQ/50ML IV SOLN
10.0000 meq | INTRAVENOUS | Status: AC
  Administered 2014-04-15 (×5): 10 meq via INTRAVENOUS
  Filled 2014-04-15 (×5): qty 50

## 2014-04-15 MED ORDER — POTASSIUM CHLORIDE 10 MEQ/50ML IV SOLN
10.0000 meq | INTRAVENOUS | Status: DC
  Filled 2014-04-15 (×3): qty 50

## 2014-04-15 MED ORDER — TRACE MINERALS CR-CU-F-FE-I-MN-MO-SE-ZN IV SOLN
INTRAVENOUS | Status: AC
  Administered 2014-04-15: 17:00:00 via INTRAVENOUS
  Filled 2014-04-15: qty 2000

## 2014-04-15 MED ORDER — POTASSIUM CHLORIDE 10 MEQ/50ML IV SOLN
10.0000 meq | INTRAVENOUS | Status: AC
  Administered 2014-04-15 (×3): 10 meq via INTRAVENOUS
  Filled 2014-04-15 (×3): qty 50

## 2014-04-15 MED ORDER — SODIUM CHLORIDE 0.9 % IV SOLN
INTRAVENOUS | Status: DC
  Filled 2014-04-15: qty 1000

## 2014-04-15 NOTE — Progress Notes (Signed)
[facility] PROGRESS NOTE  Billy Romero MVH:[id number] DOB: 06-15-1955 DOA: 04/05/2014 PCP: Lujean Amel, MD  Admission history of present illness/brief narrative:   HPI: Billy Romero is a 58 y.o. 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 Admitted from [place] due to inability to eat or drink and dehydration. Patient reports that symptoms has been steadily progressing and worsening over the last week or two and currently having trouble with keeping down even his own saliva. Plan is for patient to be admitted for IVF's resuscitation and also placement of J tube for tube feedings and nutrition. Patient had Placement open feeding jejunostomy tube by Dr. Scherrie Merritts 12/9. He had significant nausea and vomiting after his tube feeds were started, CT abdomen with contrast showing evidence of narrowing at the insertion type, as well having a kink at the insertion site . Given patient continuous nausea and vomiting, he had nasogastric tube inserted via fluoroscopic guidance by radiology, patient had 3600 mL output within half an hour from tube insertion, patient developed fever, hypoxia, tachycardia, CT chest showing evidence of bilateral aspiration pneumonia. Patient is improving on IV vancomycin and Zosyn, unfortunately his J-tube remains clogged, so he is being started on TPN.      Assessment/Plan: Dysphagia, pharyngoesophageal phase:  -secondary to complete obstruction of esophagus due to cancer. -continue IVF's resuscitation -Placement open feeding jejunostomy tube by Dr. Armandina Gemma 12/9 -electrolytes repletion as needed  Nausea and vomiting / Small bowel obstruction at J-tube insertion site./Probable ileus -Secondary to Jejunum narrowing, and kink at the insertion site, patient is  known to have history of congenital partial malrotation. - NG tube via fluoroscopic guidance inserted by radiology 12/13 , with significant improvement of symptoms . -UGI shows tube is not obstructing the lumen and thus possible ileus (patient has hypokalemia). Keep potassium greater than 4. Keep magnesium greater than 2. General surgery is following and appreciate input and recommendations.  Aspiration pneumonia  -Most likely related to copious amount of nausea and vomiting secondary to obstruction . -Continue with IV vancomycin and Zosyn (12/13 ) -No further hypoxia .  Esophagus cancer:  -Following with oncology as an outpatient -plan is for radiation , radiation oncology follow-up appreciated. -CT surgery follow-up is appreciated.  Chronic back pain: - Continue current pain regimen and muscle relaxants   Protein-calorie malnutrition, moderate: - due to decrease PO intake and cancer. -dietician has been consulted - Continue TPN Once ileus resolves may convert to entereal feeds through J-tube. Patient has been started on trickle feeds per general surgery.   Hypokalemia -Replete. Keep potassium greater than 4.   Code Status: Full Family Communication: Updated patient and wife at bedside. Disposition Plan: Home when medically stable.   Consultants:  Gen. surgery Dr.Gerkin 04/05/2014  Radiation oncology: Dr. Lisbeth Renshaw 04/06/2014  Procedures: -Placement open feeding jejunostomy tube by Dr. Scherrie Merritts 12/9 - NG tube inserted by radiology via fluoroscopic guidance 04/10/14 CT abdomen and pelvis 04/08/2014 CT chest 04/10/2014 Abdominal x-ray 04/08/2014, 04/12/2014 Water-soluble upper GI series 04/11/2014    Antibiotics:  IV vancomycin 04/10/2014  IV Zosyn 04/10/2014  HPI/Subjective: Patient denies any emesis. Patient states as long as NG tube is in place no further nausea or emesis. Some improvement in abdominal pain. No bowel movement. Patient with  flatus.  Objective: Filed Vitals:   04/15/14 0534  BP: 125/64  Pulse: 87  Temp: 97.9 F (36.6 C)  Resp: 14    Intake/Output Summary (Last 24 hours) at 04/15/14 1100 Last data filed at 04/15/14 0951  Gross per 24 hour  Intake   8347 ml  Output   3600 ml  Net   4747 ml   Filed Weights   04/13/14 0625 04/14/14 0640 04/15/14 0534  Weight: 81.194 kg (179 lb) 80.513 kg (177 lb 8 oz) 80.241 kg (176 lb 14.4 oz)    Exam:   General:  NAD. NG tube in place.  Cardiovascular: RRR  Respiratory: CTAB  Abdomen: Soft, J-tube in place. Nondistended. Positive bowel sounds.  Musculoskeletal: No clubbing cyanosis or edema.  Data Reviewed: Basic Metabolic Panel:  Recent Labs Lab 04/12/14 0343 04/13/14 0600 04/13/14 1512 04/14/14 0800 04/15/14 0544  NA 139 137 135* 133* 135*  K 3.2* 3.0* 3.5* 3.5* 3.7  CL 102 99 98 97 98  CO2 27 27 25 25 25   GLUCOSE 109* 126* 123* 137* 133*  BUN 18 13 11 9 10   CREATININE 0.89 0.74 0.76 0.75 0.80  CALCIUM 9.1 8.8 8.9 8.8 9.1  MG 2.0 2.0  --  2.1 2.1  PHOS 2.7 2.6  --  2.9 3.8   Liver Function Tests:  Recent Labs Lab 04/10/14 1834 04/12/14 0343 04/13/14 0600 04/14/14 0800  AST 74*  --  14 31  ALT 178*  --  52 56*  ALKPHOS 81  --  65 76  BILITOT 1.8* 1.1 0.9 1.4*  PROT 6.5  --  6.0 6.2  ALBUMIN 3.5  --  2.7* 2.6*   No results for input(s): LIPASE, AMYLASE in the last 168 hours. No results for input(s): AMMONIA in the last 168 hours. CBC:  Recent Labs Lab 04/10/14 1834 04/11/14 0330 04/12/14 0343 04/13/14 0600 04/14/14 0800  WBC 7.4 11.4* 10.5 11.4* 9.5  NEUTROABS  --   --   --  9.4*  --   HGB 15.6 14.5 13.3 12.6* 13.3  HCT 45.5 41.8 39.4 36.8* 38.6*  MCV 84.9 84.6 86.0 84.4 85.0  PLT 183 163 152 182 209   Cardiac Enzymes: No results for input(s): CKTOTAL, CKMB, CKMBINDEX, TROPONINI in the last 168 hours. BNP (last 3 results) No results for input(s): PROBNP in the last 8760 hours. CBG:  Recent Labs Lab  04/14/14 0633 04/14/14 1359 04/14/14 1819 04/15/14 0037 04/15/14 0532  GLUCAP 109* 133* 107* 137* 126*    Recent Results (from the past 240 hour(s))  Surgical pcr screen     Status: None   Collection Time: 04/06/14 12:00 PM  Result Value Ref Range Status   MRSA, PCR NEGATIVE NEGATIVE Final   Staphylococcus aureus NEGATIVE NEGATIVE Final    Comment:        The Xpert SA Assay (FDA approved for NASAL specimens in patients over 75 years of age), is one component of a comprehensive surveillance program.  Test performance has been validated by [employer] for patients greater than or equal to 11 year old. It is not intended to diagnose infection nor to guide or monitor treatment.   Culture, blood (routine x 2)     Status: None (Preliminary result)   Collection Time: 04/10/14  6:05 PM  Result Value Ref Range Status   Specimen Description BLOOD LEFT HAND  Final   Special Requests BOTTLES DRAWN AEROBIC AND ANAEROBIC 5CC  Final   Culture  Setup Time   Final    04/11/2014 03:11 Performed at [facility]    Culture   Final           BLOOD CULTURE RECEIVED NO GROWTH TO DATE CULTURE WILL BE HELD FOR 5 DAYS BEFORE ISSUING A FINAL NEGATIVE REPORT Performed at [facility]    Report Status PENDING  Incomplete  Culture, blood (routine x 2)     Status: None (Preliminary result)   Collection Time: 04/10/14  6:27 PM  Result Value Ref Range Status   Specimen Description BLOOD LEFT ARM  Final   Special Requests BOTTLES DRAWN AEROBIC AND ANAEROBIC 10CC  Final   Culture  Setup Time   Final    04/11/2014 03:11 Performed at [facility]    Culture   Final           BLOOD CULTURE RECEIVED NO GROWTH TO DATE CULTURE WILL BE HELD FOR 5 DAYS BEFORE ISSUING A FINAL NEGATIVE REPORT Note: Culture results may be compromised due to an excessive volume of blood received in culture bottles. Performed at [facility]    Report Status PENDING  Incomplete  MRSA PCR  Screening     Status: None   Collection Time: 04/10/14 10:02 PM  Result Value Ref Range Status   MRSA by PCR NEGATIVE NEGATIVE Final    Comment:        The GeneXpert MRSA Assay (FDA approved for NASAL specimens only), is one component of a comprehensive MRSA colonization surveillance program. It is not intended to diagnose MRSA infection nor to guide or monitor treatment for MRSA infections.   Culture, Urine     Status: None   Collection Time: 04/12/14  2:08 AM  Result Value Ref Range Status   Specimen Description URINE, CLEAN CATCH  Final   Special Requests none Normal  Final   Culture  Setup Time   Final    04/12/2014 11:21 Performed at [place] Performed at [facility]   Final   Culture NO GROWTH Performed at [facility]   Final   Report Status 04/13/2014 FINAL  Final     Studies: No results found.  Scheduled Meds: . antiseptic oral rinse  7 mL Mouth Rinse q12n4p  . chlorhexidine  15 mL Mouth Rinse BID  . enoxaparin (LOVENOX) injection  40 mg Subcutaneous Q24H  . feeding supplement (OSMOLITE 1.5 CAL)  1,000 mL Per Tube Q24H  . insulin aspart  0-9 Units Subcutaneous 4 times per day  . LORazepam  1 mg Intravenous Once  . pantoprazole (PROTONIX) IV  40 mg Intravenous Q12H  . piperacillin-tazobactam (ZOSYN)  IV  3.375 g Intravenous Q8H  . potassium chloride  10 mEq Intravenous Q1 Hr x 5  . potassium chloride  10 mEq Intravenous Q1 Hr x 3  . sodium chloride  10-40 mL Intracatheter Q12H  . vancomycin  1,250 mg Intravenous Q8H   Continuous Infusions: . Marland KitchenTPN (CLINIMIX-E) Adult 65 mL/hr at 04/14/14 1728   And  . fat emulsion 250 mL (04/14/14 1727)  . Marland KitchenTPN (CLINIMIX-E) Adult     And  . fat emulsion    . sodium chloride 0.9 % 1,000 mL with potassium chloride 40 mEq infusion    . sodium chloride 0.9 % 1,000 mL with potassium chloride 40 mEq infusion      Principal Problem:   Dysphagia, pharyngoesophageal  phase Active Problems:   Esophagus cancer   Chronic back pain   Protein-calorie malnutrition, severe   Dysphagia  Malnutrition of moderate degree   Esophageal carcinoma   SBO (small bowel obstruction)   Ileus, postoperative    Time spent: 56 minutes    Job Holtsclaw M.D. [facility] Pager [phone]. If 7PM-7AM, please contact night-coverage at [email or web address], password [facility] 04/15/2014, 11:00 AM  LOS: 10 days

## 2014-04-15 NOTE — Progress Notes (Signed)
NUTRITION FOLLOW-UP  INTERVENTION: -Initiation of trickle feeds (and advancement) per surgery  -Osmolite 1.5 at 10 ml/hr provides 360 kcal, 15 gram protein, 183 ml free water -TPN per pharmacy; continue with current conservative advancement -RD to continue to monitor   For D/C: -Recommend 24 hr infusion during admit with transition to 55-97 hour cyclic regimen once pt stable for d/c (pt unable to utilize J-tube for two weeks; will likely require TPN at home) -Recommend CM c/s for assistance with home nutrition support needs  NUTRITION DIAGNOSIS: Inadequate oral intake related to esophageal cancer as evidenced by need for nutrition support, ongoing.  Goal: Pt to meet >/= 90% of their estimated nutrition needs, will meet with TPN   Monitor:  TPN regimen & tolerance, weight, labs, I/O's  ASSESSMENT: 58 y.o. Admitted with progressive dysphagia. He is now unable to tolerate any liquids. PMHX of Esophagus cancer, squamous cell carcinoma, Weight loss, and ulcerative colitis.  12/8 -Pt reports not being able to drink any liquids, states that all liquids come back up. This has been occuring for the past day. Prior to this, pt was able to drink liquids and consume pureed foods with intermittent tolerance some days. -Pt states that he has lost 15-20 lb x 3-4 months. Pt with 28 lb weight loss, 13% weight loss x 3-4 months, significant for time frame.  -Pt followed by [place] RD, last seen 12/7. Pt is scheduled to begin radiation treatments next week. -RD to follow-up with patient for more education regarding TF regimen when wife is present. Will discuss home feeding regimen before discharge. -Nutrition focused physical exam shows no sign of depletion of muscle mass or body fat.  12/10 -J-tube placed 12/9 -RD consulted for TF initiation and management -Pt reports tolerating TF so far, not yet at goal. No issues. Pt feels much better. -Pt and wife with many questions regarding TF  regimen, transition to home feedings and how the pump works. -RD to review home TF regimen on 12/11 before discharge, Pt's wife would like to be present during education.  12/11: - Pt's TF held due to pt experiencing nausea and vomiting. Per MD note, TF to be re-started at a lower rate. No indication of ileus or blockage.  - Spoke with pt's wife regarding home TF regimen as RD not available tomorrow. Questions were answered. RD to follow-up with pt and wife on Sunday.   Details for Home Feedings Feedings per J-tube TF: Osmolite 1.5 @ goal rate of 110 ml/hr over 12 hours (tentatively 8pm-8am).  30 ml Prostat BID Free water: 60 ml before and after each continuous feeding. Patient will need an additional 875 ml per day (~8 oz QID).  Above to provide: 2180 kcal, 113g protein, and 2000 ml free water  Labs reviewed: Mg/Phos/K WNL Low Na Glucose 140  12/14: -MD noted pt's Jtube shifted to RUQ, resulting in a "kink" and duodenal and gastric distension -NG tube place for suction on 12/13, initially with 2400 ml output. Improving today with 1200 ml output -Osmolite 1.5 infusing at 25 ml/hr on 12/13. Held d/t high output. Per discussion with RN, Jtube unable to be flushed this am.  -Pt currently in radiology, RN reported plan to utilize carbonated beverage to assist in unclogging Jtube, and plan to restart Osmolite 1.5 if clog dissipates -Continue with previous recommendations for advancement rate.  -CBG elevated but within goal of < 150 mg/dL -K low, receiving IVF replacement -Wt decreased 7 lb since admit, likely r/t to pt with -  4L fluid balance, and enteral nutrition intolerance  12/15: -Jtube remains clogged after several attempts by RN to flush/unclog tube -Per UGI results, surgery noted tube not causing obstruction and pt with likely leus exacerbated by hypokalemia. Unable to replace Jtube for 2 weeks as fibrous tract not formed yet -NG continue on suction, output improving, ~ 1850 ml in  past 24 hours -PICC line placed on 12/15 to initiate TPN -Discussed TPN with pharmacy. Recommend conservative advancement (advance to goal 3-5 days d/t refeeding risk, can modify advancement rate pending labs); monitor K, Phos, Mg and replete as warranted -Discussed TPN with pt and pt's wife; had concerns regarding discharge needs and use of Jtube in future. Recommend transition to cyclic reigmen pending d/c plan; and utilize Jtube as replacement tolerated. Would benefit from case management assistance. -Pt and wife very apprehensive about future use of Jtube. Will continue to educate regarding nutrition support needs/plans -Phos/Mg WNL -K Low -CBG WNL  12/16: -Clinimix E 5/15 infusing at 40 ml/hr. Held for radiation treatment earlier today, and resumed rate w/out difficulty -Phos/Mg WNL; recommend conservative advancement to 50 ml/hr; will follow up with pharmacy on 12/17 to discuss further advances (pending labs) -K low; being aggressively repleted to also assist with ileus -Per pharmacy note;Clinimix E 5/15 at a goal rate of 90 ml/hr + 20% fat emulsion at 10 ml/hr to provide: 108 g/day protein, 2013 Kcal/day. -Will meet 100% est kcal and protein needs -Per surgery, holding off use of Jtube until ileus resolved/bowel function -NG output 1550 ml in 24 hr; 550 ml today -Some nausea noted; receiving Reglan -CBG slightly elevated but within goal of < 150 mg/dL  12/18: -Pt tolerating TPN infusion. Per pharmacy note, plan to increase Clinimix E 5/15 to 75 ml/hr w/20% lipids at 10 ml/hr. Will likely advance to goal rate tomorrow (12/19) -Discussed pt with pharmacy and RN. Plan to initiate Osmolite 1.5 at 10 ml/hr trickle feeds to be managed by surgery only.  -RD will continue to monitor tolerance and make TF recommendations as appropriate -Followed up with pt's wife while pt resting. Tolerating trickle feeds w/out nausea/vomiting. Questions answered. Family wishes to speak to surgeon regarding  Jtube -NG  with 1975 ml output -No BM or flatus -K low,Receiving aggressive K replacement  Height: Ht Readings from Last 1 Encounters:  04/12/14 _0  (1.803 m)    Weight: Wt Readings from Last 1 Encounters:  04/15/14 176 lb 14.4 oz (80.241 kg)  04/08/14 185 lb 04/05/14 182 lb  BMI:  Body mass index is 24.68 kg/(m^2).  Estimated Nutritional Needs: Kcal: 2000-2200 Protein: 105-115g Fluid: 2L/day  Skin: intact; surgical incision  Diet Order: Diet NPO time specified Except for: Ice Chips TPN (CLINIMIX-E) Adult TPN (CLINIMIX-E) Adult  EDUCATION NEEDS: -Education needs met   Intake/Output Summary (Last 24 hours) at 04/15/14 1446 Last data filed at 04/15/14 1400  Gross per 24 hour  Intake   8967 ml  Output   3800 ml  Net   5167 ml    Last BM: 12/10  Labs:   Recent Labs Lab 04/13/14 0600 04/13/14 1512 04/14/14 0800 04/15/14 0544  NA 137 135* 133* 135*  K 3.0* 3.5* 3.5* 3.7  CL 99 98 97 98  CO2 _1 BUN _2 CREATININE 0.74 0.76 0.75 0.80  CALCIUM 8.8 8.9 8.8 9.1  MG 2.0  --  2.1 2.1  PHOS 2.6  --  2.9 3.8  GLUCOSE 126* 123* 137* 133*  CBG (last 3)   Recent Labs  04/15/14 0037 04/15/14 0532 04/15/14 1154  GLUCAP 137* 126* 126*    Scheduled Meds: . antiseptic oral rinse  7 mL Mouth Rinse q12n4p  . chlorhexidine  15 mL Mouth Rinse BID  . enoxaparin (LOVENOX) injection  40 mg Subcutaneous Q24H  . feeding supplement (OSMOLITE 1.5 CAL)  1,000 mL Per Tube Q24H  . insulin aspart  0-9 Units Subcutaneous 4 times per day  . LORazepam  1 mg Intravenous Once  . pantoprazole (PROTONIX) IV  40 mg Intravenous Q12H  . piperacillin-tazobactam (ZOSYN)  IV  3.375 g Intravenous Q8H  . potassium chloride  10 mEq Intravenous Q1 Hr x 3  . sodium chloride  10-40 mL Intracatheter Q12H  . vancomycin  1,250 mg Intravenous Q8H    Continuous Infusions: . Marland KitchenTPN (CLINIMIX-E) Adult 65 mL/hr at 04/14/14 1728   And  . fat emulsion 250 mL (04/14/14  1727)  . Marland KitchenTPN (CLINIMIX-E) Adult     And  . fat emulsion    . sodium chloride 0.9 % 1,000 mL with potassium chloride 40 mEq infusion    . sodium chloride 0.9 % 1,000 mL with potassium chloride 40 mEq infusion      Atlee Abide MS RD LDN Clinical Dietitian AIDKS:[phone]

## 2014-04-15 NOTE — Progress Notes (Signed)
[place] NOTE  Pharmacy Consult for TPN Indication: Intolerance to enteral feeding  No Known Allergies  Patient Measurements: Height: 5\' 11"  (180.3 cm) Weight: 176 lb 14.4 oz (80.241 kg) IBW/kg (Calculated) : 75.3 Adjusted Body Weight: 76.5 kg  Vital Signs: Temp: 97.9 F (36.6 C) (12/18 0534) Temp Source: Oral (12/18 0534) BP: 125/64 mmHg (12/18 0534) Pulse Rate: 87 (12/18 0534) Intake/Output from previous day: 12/17 0701 - 12/18 0700 In: 8227 [I.V.:2159.8; IV Piggyback:2050; TPN:3057.3] Out: 3900 [Urine:1925; Emesis/NG XTKWIO:9735] Intake/Output from this shift: Total I/O In: 120 [Other:20; IV Piggyback:100] Out: 200 [Urine:200]  Labs:  Recent Labs  04/13/14 0600 04/14/14 0800  WBC 11.4* 9.5  HGB 12.6* 13.3  HCT 36.8* 38.6*  PLT 182 209     Recent Labs  04/13/14 0600 04/13/14 1512 04/14/14 0800 04/15/14 0544  NA 137 135* 133* 135*  K 3.0* 3.5* 3.5* 3.7  CL 99 98 97 98  CO2 27 25 25 25   GLUCOSE 126* 123* 137* 133*  BUN 13 11 9 10   CREATININE 0.74 0.76 0.75 0.80  CALCIUM 8.8 8.9 8.8 9.1  MG 2.0  --  2.1 2.1  PHOS 2.6  --  2.9 3.8  PROT 6.0  --  6.2  --   ALBUMIN 2.7*  --  2.6*  --   AST 14  --  31  --   ALT 52  --  56*  --   ALKPHOS 65  --  76  --   BILITOT 0.9  --  1.4*  --   PREALBUMIN 6.5*  --   --   --   TRIG 121  --   --   --    Estimated Creatinine Clearance: 104.6 mL/min (by C-G formula based on Cr of 0.8).    Recent Labs  04/14/14 1819 04/15/14 0037 04/15/14 0532  GLUCAP 107* 137* 126*    Medical History: Past Medical History  Diagnosis Date  . Esophageal cancer 03/29/14   Insulin Requirements:   3 units sensitive SSI used in past 24h  Current Nutrition:   NPO Clinimix E 5/15 @ 65 ml/hr 20% fat emulsion @ 10 ml/hr Osmolite 1.5 to start today through J-tube at 10 ml/hr  IVF:  NS with KCL 40 mEq at 60 mL/hr  Central access: PICC placed 04/12/14 TPN start date: 04/12/14  ASSESSMENT                                                                                                           HPI:  58 y/o M with PMH of alcohol abuse (sober for last 9 years), chronic back and neck pain, and esophageal cancer with complete esophagus obstruction causing dysphagia. Admitted from [facility] on 12/8 due to inability to eat or drink and dehydration. He is s/p open feeding jejunostomy tube on 12/9 and developed significant n/v after TF started. NGT inserted in IR 12/13 with immediate output. Pt developed fever, chills, tachycardia and hypoxia. CT of chest showed bilateral aspiration pneumonia, for which he is on broad spectrum antibiotics, Vancomycin and  Zosyn. J tube became clogged, so patient ordered to be transitioned from tube feeds to TPN for nutrition, with pharmacy to assist with dosing.    SIGNIFICANT EVENTS                                                                                              12/16: Surgery MD states waiting for return of bowel function before restarting tube feeds 12/18: Surgery starting trickle TFs, will increase based on toleration. NGT still with copious output  Today:    Glucose - CBGs at goal < 150  Electrolytes - K 3.7 (improved after aggressive repletion), Na improved to 135 (can not adjust in pre-made Clinimix) all others WNL  Renal - SCr WNL, 0.80- stable  LFTs - AST/ALT with very slight elevation  TGs - 121  Prealbumin - 6.5 (12/16)  NUTRITIONAL GOALS                                                                                             RD recs:  Kcal: 2000-2200 Protein: 105-115g Fluid: 2L/day  Clinimix E 5/15 at a goal rate of 90 ml/hr + 20% fat emulsion at 10 ml/hr to provide: 108 g/day protein, 2013 Kcal/day.  PLAN                                                                                                                          KCl replacement with 10 mEq runs x5 ordered by [facility] MD  At 1800 today:  Increase Clinimix E 5/15 to 75 ml/hr.          Using conservative advancement of TPN toward goal rate due to risk of refeeding syndrome  20% fat emulsion at 10 ml/hr.  Plan to advance as tolerated to the goal rate. Will advance slowly due to risk of refeeding.  TPN to contain standard multivitamins and trace elements.  Decrease IVF to 50 ml/hr.  KCl replacement with additional 10 mEq runs x3 (total of 80 mEq to be delivered by KCl runs today)  Continue sensitive Scale SSI and CBG checks q6h.   TPN lab panels on Mondays & Thursdays.  Bmet, Mag, and Phos in AM  F/u daily.  Thank you for the  consult.  Currie Paris, PharmD, BCPS Pager: [phone] Pharmacy: [phone] 04/15/2014 10:49 AM

## 2014-04-15 NOTE — Progress Notes (Signed)
9 Days Post-Op  Subjective: No BM or flatus.    Objective: Vital signs in last 24 hours: Temp:  [97.9 F (36.6 C)-98.6 F (37 C)] 97.9 F (36.6 C) (12/18 0534) Pulse Rate:  [86-90] 87 (12/18 0534) Resp:  [14-16] 14 (12/18 0534) BP: (125-150)/(64-87) 125/64 mmHg (12/18 0534) SpO2:  [96 %-97 %] 96 % (12/18 0534) Weight:  [176 lb 14.4 oz (80.241 kg)] 176 lb 14.4 oz (80.241 kg) (12/18 0534) Last BM Date: 04/07/14 (MD aware; pt has ileus)  Intake/Output from previous day: 12/17 0701 - 12/18 0700 In: 8227 [I.V.:2159.8; IV Piggyback:2050; TPN:3057.3] Out: 3900 [Urine:1925; Emesis/NG KYHCWC:3762] Intake/Output this shift:    PE: General- In NAD Abdomen-soft, flat, not tender, no bowel sounds, incision clean and intact, j-tube on left   Lab Results:   Recent Labs  04/13/14 0600 04/14/14 0800  WBC 11.4* 9.5  HGB 12.6* 13.3  HCT 36.8* 38.6*  PLT 182 209   BMET  Recent Labs  04/14/14 0800 04/15/14 0544  NA 133* 135*  K 3.5* 3.7  CL 97 98  CO2 25 25  GLUCOSE 137* 133*  BUN 9 10  CREATININE 0.75 0.80  CALCIUM 8.8 9.1   PT/INR No results for input(s): LABPROT, INR in the last 72 hours. Comprehensive Metabolic Panel:    Component Value Date/Time   NA 135* 04/15/2014 0544   NA 133* 04/14/2014 0800   K 3.7 04/15/2014 0544   K 3.5* 04/14/2014 0800   CL 98 04/15/2014 0544   CL 97 04/14/2014 0800   CO2 25 04/15/2014 0544   CO2 25 04/14/2014 0800   BUN 10 04/15/2014 0544   BUN 9 04/14/2014 0800   CREATININE 0.80 04/15/2014 0544   CREATININE 0.75 04/14/2014 0800   GLUCOSE 133* 04/15/2014 0544   GLUCOSE 137* 04/14/2014 0800   CALCIUM 9.1 04/15/2014 0544   CALCIUM 8.8 04/14/2014 0800   AST 31 04/14/2014 0800   AST 14 04/13/2014 0600   ALT 56* 04/14/2014 0800   ALT 52 04/13/2014 0600   ALKPHOS 76 04/14/2014 0800   ALKPHOS 65 04/13/2014 0600   BILITOT 1.4* 04/14/2014 0800   BILITOT 0.9 04/13/2014 0600   PROT 6.2 04/14/2014 0800   PROT 6.0 04/13/2014 0600   ALBUMIN 2.6* 04/14/2014 0800   ALBUMIN 2.7* 04/13/2014 0600     Studies/Results: No results found.  Anti-infectives: Anti-infectives    Start     Dose/Rate Route Frequency Ordered Stop   04/12/14 2200  vancomycin (VANCOCIN) 1,250 mg in sodium chloride 0.9 % 250 mL IVPB     1,250 mg166.7 mL/hr over 90 Minutes Intravenous Every 8 hours 04/12/14 1455     04/11/14 0600  piperacillin-tazobactam (ZOSYN) IVPB 3.375 g     3.375 g12.5 mL/hr over 240 Minutes Intravenous Every 8 hours 04/10/14 2111     04/11/14 0600  vancomycin (VANCOCIN) IVPB 1000 mg/200 mL premix  Status:  Discontinued     1,000 mg200 mL/hr over 60 Minutes Intravenous Every 8 hours 04/10/14 2111 04/12/14 1454   04/11/14 0400  piperacillin-tazobactam (ZOSYN) IVPB 3.375 g  Status:  Discontinued     3.375 g12.5 mL/hr over 240 Minutes Intravenous Every 8 hours 04/10/14 1910 04/10/14 2110   04/11/14 0400  vancomycin (VANCOCIN) IVPB 1000 mg/200 mL premix  Status:  Discontinued     1,000 mg200 mL/hr over 60 Minutes Intravenous Every 8 hours 04/10/14 1910 04/10/14 2110   04/10/14 2130  piperacillin-tazobactam (ZOSYN) IVPB 3.375 g     3.375 g100 mL/hr over  30 Minutes Intravenous  Once 04/10/14 2111 04/10/14 2155   04/10/14 2130  vancomycin (VANCOCIN) IVPB 1000 mg/200 mL premix     1,000 mg200 mL/hr over 60 Minutes Intravenous  Once 04/10/14 2111 04/10/14 2225   04/10/14 1800  vancomycin (VANCOCIN) IVPB 1000 mg/200 mL premix  Status:  Discontinued     1,000 mg200 mL/hr over 60 Minutes Intravenous  Once 04/10/14 1741 04/10/14 2110   04/10/14 1800  piperacillin-tazobactam (ZOSYN) IVPB 3.375 g  Status:  Discontinued     3.375 g12.5 mL/hr over 240 Minutes Intravenous  Once 04/10/14 1741 04/10/14 2110   04/06/14 1245  cefOXitin (MEFOXIN) 2 g in dextrose 5 % 50 mL IVPB     2 g100 mL/hr over 30 Minutes Intravenous On call to O.R. 04/06/14 1231 04/06/14 1305      Assessment Obstructing esophageal cancer s/p open jejunostomy tube placement  04/06/14-ng output down some;  UGI shows tube is not completely obstructing lumen.    Postop ileus-Potassium normal x 2 days   CT of chest suggests aspiration pneumonia and abxs started.    PC Malnutrition-TPN started   LOS: 10 days   Plan:  Start low rate tube feeds.  We will advance his feeds based on a daily assessment.   Billy Romero J 04/15/2014

## 2014-04-16 DIAGNOSIS — M549 Dorsalgia, unspecified: Secondary | ICD-10-CM

## 2014-04-16 DIAGNOSIS — G8929 Other chronic pain: Secondary | ICD-10-CM

## 2014-04-16 LAB — VANCOMYCIN, TROUGH: VANCOMYCIN TR: 22 ug/mL — AB (ref 10.0–20.0)

## 2014-04-16 LAB — PHOSPHORUS: Phosphorus: 4.2 mg/dL (ref 2.3–4.6)

## 2014-04-16 LAB — GLUCOSE, CAPILLARY
GLUCOSE-CAPILLARY: 110 mg/dL — AB (ref 70–99)
Glucose-Capillary: 126 mg/dL — ABNORMAL HIGH (ref 70–99)
Glucose-Capillary: 129 mg/dL — ABNORMAL HIGH (ref 70–99)
Glucose-Capillary: 131 mg/dL — ABNORMAL HIGH (ref 70–99)
Glucose-Capillary: 145 mg/dL — ABNORMAL HIGH (ref 70–99)

## 2014-04-16 LAB — BASIC METABOLIC PANEL
Anion gap: 12 (ref 5–15)
BUN: 14 mg/dL (ref 6–23)
CO2: 26 mEq/L (ref 19–32)
Calcium: 9.8 mg/dL (ref 8.4–10.5)
Chloride: 101 mEq/L (ref 96–112)
Creatinine, Ser: 1.08 mg/dL (ref 0.50–1.35)
GFR calc Af Amer: 84 mL/min — ABNORMAL LOW (ref >90)
GFR calc non Af Amer: 73 mL/min — ABNORMAL LOW (ref >90)
GLUCOSE: 137 mg/dL — AB (ref 70–99)
Potassium: 4.8 mEq/L (ref 3.7–5.3)
Sodium: 139 mEq/L (ref 137–147)

## 2014-04-16 LAB — MAGNESIUM: Magnesium: 2.4 mg/dL (ref 1.5–2.5)

## 2014-04-16 MED ORDER — OSMOLITE 1.5 CAL PO LIQD
1000.0000 mL | ORAL | Status: DC
  Filled 2014-04-16 (×2): qty 1000

## 2014-04-16 MED ORDER — CLINIMIX E/DEXTROSE (5/15) 5 % IV SOLN
INTRAVENOUS | Status: AC
Start: 2014-04-16 — End: 2014-04-17
  Administered 2014-04-16: 17:00:00 via INTRAVENOUS
  Filled 2014-04-16: qty 2000

## 2014-04-16 MED ORDER — POTASSIUM CHLORIDE IN NACL 20-0.9 MEQ/L-% IV SOLN
INTRAVENOUS | Status: DC
  Administered 2014-04-16 – 2014-04-18 (×3): via INTRAVENOUS
  Filled 2014-04-16 (×5): qty 1000

## 2014-04-16 MED ORDER — VANCOMYCIN HCL IN DEXTROSE 1-5 GM/200ML-% IV SOLN
1000.0000 mg | Freq: Two times a day (BID) | INTRAVENOUS | Status: DC
  Filled 2014-04-16: qty 200

## 2014-04-16 MED ORDER — FAT EMULSION 20 % IV EMUL
250.0000 mL | INTRAVENOUS | Status: AC
  Administered 2014-04-16: 250 mL via INTRAVENOUS
  Filled 2014-04-16: qty 250

## 2014-04-16 NOTE — Progress Notes (Signed)
PARENTERAL NUTRITION CONSULT NOTE  Pharmacy Consult for TPN Indication: Intolerance to enteral feeding  No Known Allergies  Patient Measurements: Height: 5\' 11"  (180.3 cm) Weight: 176 lb (79.833 kg) IBW/kg (Calculated) : 75.3 Adjusted Body Weight: 76.5 kg  Vital Signs: Temp: 97.9 F (36.6 C) (12/19 0522) Temp Source: Oral (12/19 0522) BP: 139/79 mmHg (12/19 0522) Pulse Rate: 87 (12/19 0522) Intake/Output from previous day: 12/18 0701 - 12/19 0700 In: 4298 [I.V.:1560; IV Piggyback:1250; MEQ:6834] Out: 1962 [IWLNL:8921; Emesis/NG output:2150] Intake/Output from this shift:    Labs:  Recent Labs  04/14/14 0800  WBC 9.5  HGB 13.3  HCT 38.6*  PLT 209     Recent Labs  04/14/14 0800 04/15/14 0544 04/16/14 0650  NA 133* 135* 139  K 3.5* 3.7 4.8  CL 97 98 101  CO2 25 25 26   GLUCOSE 137* 133* 137*  BUN 9 10 14   CREATININE 0.75 0.80 1.08  CALCIUM 8.8 9.1 9.8  MG 2.1 2.1 2.4  PHOS 2.9 3.8 4.2  PROT 6.2  --   --   ALBUMIN 2.6*  --   --   AST 31  --   --   ALT 56*  --   --   ALKPHOS 76  --   --   BILITOT 1.4*  --   --   Corrected Ca = 10.9 Estimated Creatinine Clearance: 77.5 mL/min (by C-G formula based on Cr of 1.08).    Recent Labs  04/15/14 1747 04/16/14 0013 04/16/14 0531  GLUCAP 122* 131* 110*    Medical History: Past Medical History  Diagnosis Date  . Esophageal cancer 03/29/14   Insulin Requirements:   4 units sensitive SSI Novolog used in past 24h  Current Nutrition:   NPO Clinimix E 5/15 @ 75 ml/hr 20% fat emulsion @ 10 ml/hr Osmolite 1.5 via J-tube at 30 ml/hr  IVF:  NS with KCL 40 mEq at 60 mL/hr  Central access: PICC placed 04/12/14 TPN start date: 04/12/14  ASSESSMENT                                                                                                          HPI:  58 y/o M with PMH of alcohol abuse (sober for last 9 years), chronic back and neck pain, and esophageal cancer with complete esophagus obstruction  causing dysphagia. Admitted from [facility] on 12/8 due to inability to eat or drink and dehydration. He is s/p open feeding jejunostomy tube on 12/9 and developed significant n/v after TF started. NGT inserted in IR 12/13 with immediate output. Pt developed fever, chills, tachycardia and hypoxia. CT of chest showed bilateral aspiration pneumonia, for which he is on broad spectrum antibiotics, Vancomycin and Zosyn. J tube became clogged, so patient ordered to be transitioned from tube feeds to TPN for nutrition, with pharmacy to assist with dosing.    SIGNIFICANT EVENTS  12/16: Surgery MD states waiting for return of bowel function before restarting tube feeds 12/18: Surgery starting trickle TFs via J-tube, will increase based on toleration. NGT still with copious output 12/19: Surgery increased Osmolite 1.5 to 30 mL/hr via J-tube. TPN reduced to 50 mL/hr.  Today, 12/19:   Glucose - CBGs at goal < 150 on minimal SSI coverage  Electrolytes - WNL. K up after runs yesterday.  Renal - SCr WNL but up slightly. BUN WNL.  LFTs - ALT and T.Bili slightly elevated but <1.5x ULN (12/17)  TGs - WNL (12/16)  Prealbumin - 6.5 (12/16)  NUTRITIONAL GOALS                                                                                             RD recs:  Kcal: 2000-2200 Protein: 105-115g Fluid: 2L/day  Clinimix E 5/15 at a goal rate of 90 ml/hr + 20% fat emulsion at 10 ml/hr to provide: 108 g/day protein, 2013 Kcal/day.  Alternatively, if tolerates TF: Osmolite-1.5, 1320 mL/day via J-tube with additional fluid (see RD note from 12/18)  PLAN                                                                                                                     1. Reduce KCl in maintenance IVF to 20 mEq/L 2. At 1800 today:  Reduce Clinimix E 5/15 to 50 ml/hr.  Reduce 20% Fat Emulsion to 8 ml/hr.  TPN to contain  standard multivitamins and trace elements. 3. Continue sensitive Scale SSI and CBG checks q6h.  4. TPN lab panels on Mondays & Thursdays. 5. Bmet in AM. 6. Follow tolerance of enteral feedings and further reduce TPN rate as enteral feeds are advanced.   Clayburn Pert, PharmD, BCPS Pager: [phone] 04/16/2014  9:12 AM

## 2014-04-16 NOTE — Progress Notes (Signed)
10 Days Post-Op  Subjective: Passed a little gas.  Objective: Vital signs in last 24 hours: Temp:  [97.4 F (36.3 C)-98 F (36.7 C)] 97.9 F (36.6 C) (12/19 0522) Pulse Rate:  [87-97] 87 (12/19 0522) Resp:  [16-18] 16 (12/19 0522) BP: (129-142)/(79-91) 139/79 mmHg (12/19 0522) SpO2:  [97 %-99 %] 99 % (12/19 0522) Weight:  [176 lb (79.833 kg)] 176 lb (79.833 kg) (12/19 0522) Last BM Date: 04/07/14  Intake/Output from previous day: 12/18 0701 - 12/19 0700 In: 3041.2 [I.V.:1409.2; IV Piggyback:950; TPN:622] Out: 5053 [Urine:1225; Emesis/NG output:2150] Intake/Output this shift:    PE: General- In NAD Abdomen-soft, flat, not tender, occasional bowel sound, incision clean and intact, j-tube on left   Lab Results:   Recent Labs  04/14/14 0800  WBC 9.5  HGB 13.3  HCT 38.6*  PLT 209   BMET  Recent Labs  04/15/14 0544 04/16/14 0650  NA 135* 139  K 3.7 4.8  CL 98 101  CO2 25 26  GLUCOSE 133* 137*  BUN 10 14  CREATININE 0.80 1.08  CALCIUM 9.1 9.8   PT/INR No results for input(s): LABPROT, INR in the last 72 hours. Comprehensive Metabolic Panel:    Component Value Date/Time   NA 139 04/16/2014 0650   NA 135* 04/15/2014 0544   K 4.8 04/16/2014 0650   K 3.7 04/15/2014 0544   CL 101 04/16/2014 0650   CL 98 04/15/2014 0544   CO2 26 04/16/2014 0650   CO2 25 04/15/2014 0544   BUN 14 04/16/2014 0650   BUN 10 04/15/2014 0544   CREATININE 1.08 04/16/2014 0650   CREATININE 0.80 04/15/2014 0544   GLUCOSE 137* 04/16/2014 0650   GLUCOSE 133* 04/15/2014 0544   CALCIUM 9.8 04/16/2014 0650   CALCIUM 9.1 04/15/2014 0544   AST 31 04/14/2014 0800   AST 14 04/13/2014 0600   ALT 56* 04/14/2014 0800   ALT 52 04/13/2014 0600   ALKPHOS 76 04/14/2014 0800   ALKPHOS 65 04/13/2014 0600   BILITOT 1.4* 04/14/2014 0800   BILITOT 0.9 04/13/2014 0600   PROT 6.2 04/14/2014 0800   PROT 6.0 04/13/2014 0600   ALBUMIN 2.6* 04/14/2014 0800   ALBUMIN 2.7* 04/13/2014 0600      Studies/Results: No results found.  Anti-infectives: Anti-infectives    Start     Dose/Rate Route Frequency Ordered Stop   04/12/14 2200  vancomycin (VANCOCIN) 1,250 mg in sodium chloride 0.9 % 250 mL IVPB     1,250 mg166.7 mL/hr over 90 Minutes Intravenous Every 8 hours 04/12/14 1455     04/11/14 0600  piperacillin-tazobactam (ZOSYN) IVPB 3.375 g     3.375 g12.5 mL/hr over 240 Minutes Intravenous Every 8 hours 04/10/14 2111     04/11/14 0600  vancomycin (VANCOCIN) IVPB 1000 mg/200 mL premix  Status:  Discontinued     1,000 mg200 mL/hr over 60 Minutes Intravenous Every 8 hours 04/10/14 2111 04/12/14 1454   04/11/14 0400  piperacillin-tazobactam (ZOSYN) IVPB 3.375 g  Status:  Discontinued     3.375 g12.5 mL/hr over 240 Minutes Intravenous Every 8 hours 04/10/14 1910 04/10/14 2110   04/11/14 0400  vancomycin (VANCOCIN) IVPB 1000 mg/200 mL premix  Status:  Discontinued     1,000 mg200 mL/hr over 60 Minutes Intravenous Every 8 hours 04/10/14 1910 04/10/14 2110   04/10/14 2130  piperacillin-tazobactam (ZOSYN) IVPB 3.375 g     3.375 g100 mL/hr over 30 Minutes Intravenous  Once 04/10/14 2111 04/10/14 2155   04/10/14 2130  vancomycin (VANCOCIN)  IVPB 1000 mg/200 mL premix     1,000 mg200 mL/hr over 60 Minutes Intravenous  Once 04/10/14 2111 04/10/14 2225   04/10/14 1800  vancomycin (VANCOCIN) IVPB 1000 mg/200 mL premix  Status:  Discontinued     1,000 mg200 mL/hr over 60 Minutes Intravenous  Once 04/10/14 1741 04/10/14 2110   04/10/14 1800  piperacillin-tazobactam (ZOSYN) IVPB 3.375 g  Status:  Discontinued     3.375 g12.5 mL/hr over 240 Minutes Intravenous  Once 04/10/14 1741 04/10/14 2110   04/06/14 1245  cefOXitin (MEFOXIN) 2 g in dextrose 5 % 50 mL IVPB     2 g100 mL/hr over 30 Minutes Intravenous On call to O.R. 04/06/14 1231 04/06/14 1305      Assessment Obstructing esophageal cancer s/p open jejunostomy tube placement 04/06/14-ng output down some;  UGI shows tube is not completely  obstructing lumen.    Postop ileus-starting to improve; less ng output   CT of chest suggests aspiration pneumonia and abxs started.    PC Malnutrition-TPN started; tolerated low rate tube feeds.   LOS: 11 days   Plan:  Increase tube feeds.  Leave ng in.   Valisha Heslin J 04/16/2014

## 2014-04-16 NOTE — Progress Notes (Signed)
[facility] PROGRESS NOTE  Billy Romero YNW:[id number] DOB: 22-Apr-1956 DOA: 04/05/2014 PCP: Lujean Amel, MD  Admission history of present illness/brief narrative:   HPI: Billy Romero is a 58 y.o. 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 Admitted from [facility] due to inability to eat or drink and dehydration. Patient reports that symptoms has been steadily progressing and worsening over the last week or two and currently having trouble with keeping down even his own saliva. Plan is for patient to be admitted for IVF's resuscitation and also placement of J tube for tube feedings and nutrition. Patient had Placement open feeding jejunostomy tube by Dr. Scherrie Merritts 12/9. He had significant nausea and vomiting after his tube feeds were started, CT abdomen with contrast showing evidence of narrowing at the insertion type, as well having a kink at the insertion site . Given patient continuous nausea and vomiting, he had nasogastric tube inserted via fluoroscopic guidance by radiology, patient had 3600 mL output within half an hour from tube insertion, patient developed fever, hypoxia, tachycardia, CT chest showing evidence of bilateral aspiration pneumonia. Patient is improving on IV vancomycin and Zosyn, unfortunately his J-tube remains clogged, so he is being started on TPN.      Assessment/Plan: Dysphagia, pharyngoesophageal phase:  -secondary to complete obstruction of esophagus due to cancer. -continue IVF's resuscitation -Placement open feeding jejunostomy tube by Dr. Armandina Gemma 12/9 -electrolytes repletion as needed  Nausea and vomiting / Small bowel obstruction at J-tube insertion site./Probable ileus -Secondary to Jejunum narrowing, and kink at the insertion site, patient is  known to have history of congenital partial malrotation. - NG tube via fluoroscopic guidance inserted by radiology 12/13 , with significant improvement of symptoms . -UGI shows tube is not obstructing the lumen and thus possible ileus (patient has hypokalemia). Keep potassium greater than 4. Keep magnesium greater than 2. General surgery is following and appreciate input and recommendations.  Aspiration pneumonia  -Most likely related to copious amount of nausea and vomiting secondary to obstruction . -Continue with IV  Zosyn (12/13 ). D/C IV Vancomycin. -No further hypoxia .  Esophagus cancer:  -Following with oncology as an outpatient -plan is for radiation , radiation oncology follow-up appreciated. -CT surgery follow-up is appreciated.  Chronic back pain: - Continue current pain regimen and muscle relaxants   Protein-calorie malnutrition, moderate: - due to decrease PO intake and cancer. -dietician has been consulted - Continue TPN Once ileus resolves may convert to entereal feeds through J-tube. Patient has been started on trickle feeds per general surgery.   Hypokalemia -Replete. Keep potassium greater than 4.   Code Status: Full Family Communication: Updated patient and no family at bedside. Disposition Plan: Home when medically stable.   Consultants:  Gen. surgery Dr.Gerkin 04/05/2014  Radiation oncology: Dr. Lisbeth Renshaw 04/06/2014  Procedures: -Placement open feeding jejunostomy tube by Dr. Scherrie Merritts 12/9 - NG tube inserted by radiology via fluoroscopic guidance 04/10/14 CT abdomen and pelvis 04/08/2014 CT chest 04/10/2014 Abdominal x-ray 04/08/2014, 04/12/2014 Water-soluble upper GI series 04/11/2014    Antibiotics:  IV vancomycin 04/10/2014>>>>04/16/14  IV Zosyn 04/10/2014  HPI/Subjective: Patient denies any emesis. Patient states as long as NG tube is in place no further nausea or emesis. Some improvement in abdominal pain. No bowel movement. Patient  with flatus.  Objective: Filed Vitals:  04/16/14 1340  BP: 129/87  Pulse: 94  Temp: 98 F (36.7 C)  Resp: 16    Intake/Output Summary (Last 24 hours) at 04/16/14 1558 Last data filed at 04/16/14 1230  Gross per 24 hour  Intake   3508 ml  Output   3675 ml  Net   -167 ml   Filed Weights   04/14/14 0640 04/15/14 0534 04/16/14 0522  Weight: 80.513 kg (177 lb 8 oz) 80.241 kg (176 lb 14.4 oz) 79.833 kg (176 lb)    Exam:   General:  NAD. NG tube in place.  Cardiovascular: RRR  Respiratory: CTAB  Abdomen: Soft, J-tube in place. Nondistended. Positive bowel sounds.  Musculoskeletal: No clubbing cyanosis or edema.  Data Reviewed: Basic Metabolic Panel:  Recent Labs Lab 04/12/14 0343 04/13/14 0600 04/13/14 1512 04/14/14 0800 04/15/14 0544 04/16/14 0650  NA 139 137 135* 133* 135* 139  K 3.2* 3.0* 3.5* 3.5* 3.7 4.8  CL 102 99 98 97 98 101  CO2 27 27 25 25 25 26   GLUCOSE 109* 126* 123* 137* 133* 137*  BUN 18 13 11 9 10 14   CREATININE 0.89 0.74 0.76 0.75 0.80 1.08  CALCIUM 9.1 8.8 8.9 8.8 9.1 9.8  MG 2.0 2.0  --  2.1 2.1 2.4  PHOS 2.7 2.6  --  2.9 3.8 4.2   Liver Function Tests:  Recent Labs Lab 04/10/14 1834 04/12/14 0343 04/13/14 0600 04/14/14 0800  AST 74*  --  14 31  ALT 178*  --  52 56*  ALKPHOS 81  --  65 76  BILITOT 1.8* 1.1 0.9 1.4*  PROT 6.5  --  6.0 6.2  ALBUMIN 3.5  --  2.7* 2.6*   No results for input(s): LIPASE, AMYLASE in the last 168 hours. No results for input(s): AMMONIA in the last 168 hours. CBC:  Recent Labs Lab 04/10/14 1834 04/11/14 0330 04/12/14 0343 04/13/14 0600 04/14/14 0800  WBC 7.4 11.4* 10.5 11.4* 9.5  NEUTROABS  --   --   --  9.4*  --   HGB 15.6 14.5 13.3 12.6* 13.3  HCT 45.5 41.8 39.4 36.8* 38.6*  MCV 84.9 84.6 86.0 84.4 85.0  PLT 183 163 152 182 209   Cardiac Enzymes: No results for input(s): CKTOTAL, CKMB, CKMBINDEX, TROPONINI in the last 168 hours. BNP (last 3 results) No results for input(s): PROBNP in  the last 8760 hours. CBG:  Recent Labs Lab 04/15/14 1154 04/15/14 1747 04/16/14 0013 04/16/14 0531 04/16/14 1129  GLUCAP 126* 122* 131* 110* 129*    Recent Results (from the past 240 hour(s))  Culture, blood (routine x 2)     Status: None (Preliminary result)   Collection Time: 04/10/14  6:05 PM  Result Value Ref Range Status   Specimen Description BLOOD LEFT HAND  Final   Special Requests BOTTLES DRAWN AEROBIC AND ANAEROBIC 5CC  Final   Culture  Setup Time   Final    04/11/2014 03:11 Performed at [facility]    Culture   Final           BLOOD CULTURE RECEIVED NO GROWTH TO DATE CULTURE WILL BE HELD FOR 5 DAYS BEFORE ISSUING A FINAL NEGATIVE REPORT Performed at [facility]    Report Status PENDING  Incomplete  Culture, blood (routine x 2)     Status: None (Preliminary result)   Collection Time: 04/10/14  6:27 PM  Result Value Ref Range Status   Specimen Description BLOOD LEFT ARM  Final  Special Requests BOTTLES DRAWN AEROBIC AND ANAEROBIC 10CC  Final   Culture  Setup Time   Final    04/11/2014 03:11 Performed at [facility]    Culture   Final           BLOOD CULTURE RECEIVED NO GROWTH TO DATE CULTURE WILL BE HELD FOR 5 DAYS BEFORE ISSUING A FINAL NEGATIVE REPORT Note: Culture results may be compromised due to an excessive volume of blood received in culture bottles. Performed at [facility]    Report Status PENDING  Incomplete  MRSA PCR Screening     Status: None   Collection Time: 04/10/14 10:02 PM  Result Value Ref Range Status   MRSA by PCR NEGATIVE NEGATIVE Final    Comment:        The GeneXpert MRSA Assay (FDA approved for NASAL specimens only), is one component of a comprehensive MRSA colonization surveillance program. It is not intended to diagnose MRSA infection nor to guide or monitor treatment for MRSA infections.   Culture, Urine     Status: None   Collection Time: 04/12/14  2:08 AM  Result Value Ref Range  Status   Specimen Description URINE, CLEAN CATCH  Final   Special Requests none Normal  Final   Culture  Setup Time   Final    04/12/2014 11:21 Performed at [place] Performed at [facility]   Final   Culture NO GROWTH Performed at [facility]   Final   Report Status 04/13/2014 FINAL  Final     Studies: No results found.  Scheduled Meds: . antiseptic oral rinse  7 mL Mouth Rinse q12n4p  . chlorhexidine  15 mL Mouth Rinse BID  . enoxaparin (LOVENOX) injection  40 mg Subcutaneous Q24H  . feeding supplement (OSMOLITE 1.5 CAL)  1,000 mL Per Tube Q24H  . insulin aspart  0-9 Units Subcutaneous 4 times per day  . LORazepam  1 mg Intravenous Once  . pantoprazole (PROTONIX) IV  40 mg Intravenous Q12H  . piperacillin-tazobactam (ZOSYN)  IV  3.375 g Intravenous Q8H  . sodium chloride  10-40 mL Intracatheter Q12H  . vancomycin  1,000 mg Intravenous Q12H   Continuous Infusions: . 0.9 % NaCl with KCl 20 mEq / L 50 mL/hr at 04/16/14 1054  . Marland KitchenTPN (CLINIMIX-E) Adult 75 mL/hr at 04/15/14 1711   And  . fat emulsion 250 mL (04/15/14 1711)  . Marland KitchenTPN (CLINIMIX-E) Adult     And  . fat emulsion      Principal Problem:   Dysphagia, pharyngoesophageal phase Active Problems:   Esophagus cancer   Chronic back pain   Protein-calorie malnutrition, severe   Dysphagia   Malnutrition of moderate degree   Esophageal carcinoma   SBO (small bowel obstruction)   Ileus, postoperative    Time spent: 76 minutes    THOMPSON,DANIEL M.D. [facility] Pager [phone]. If 7PM-7AM, please contact night-coverage at [email or web address], password [facility] 04/16/2014, 3:58 PM  LOS: 11 days

## 2014-04-16 NOTE — Progress Notes (Signed)
ANTIBIOTIC CONSULT NOTE - FOLLOW UP  Pharmacy Consult for vancomycin, Zosyn Indication: aspiration pneumonia  No Known Allergies  Patient Measurements: Height: 5\' 11"  (180.3 cm) Weight: 176 lb (79.833 kg) IBW/kg (Calculated) : 75.3   Vital Signs: Temp: 97.9 F (36.6 C) (12/19 0522) Temp Source: Oral (12/19 0522) BP: 139/79 mmHg (12/19 0522) Pulse Rate: 87 (12/19 0522) Intake/Output from previous day: 12/18 0701 - 12/19 0700 In: 4298 [I.V.:1560; IV Piggyback:1250; XBJ:4782] Out: 9562 [ZHYQM:5784; Emesis/NG output:2150] Intake/Output from this shift: Total I/O In: 0  Out: 600 [Urine:600]  Labs:  Recent Labs  04/14/14 0800 04/15/14 0544 04/16/14 0650  WBC 9.5  --   --   HGB 13.3  --   --   PLT 209  --   --   CREATININE 0.75 0.80 1.08   Estimated Creatinine Clearance: 77.5 mL/min (by C-G formula based on Cr of 1.08).  Recent Labs  04/14/14 1325 04/16/14 1234  VANCOTROUGH 12.9 22.0*     Microbiology: 12/13 blood x 2: ngtd 12/15 urine: NGF 12/9, 12/13 MRSA PCR: negative  Anti-infectives: 12/13 >> Vanc >> 12/13 >> Zosyn >>  Assessment: 58 yo male with hx alcohol abuse (sober for last 9 years), chronic back and neck pain and esophageal cancer with complete esophagus obstruction causing dysphagia. Admitted from [facility] on 12/8 due to inability to eat or drink and dehydration. He is s/p open feeding jejunostomy tube on 12/9 and developed significant n/v after TF started. NGT inserted in IR 12/13 with immediate output. Pt developed fever, chills, tachycardia and hypoxia. CT ordered to r/o perforation and pharmacy consulted to dose vancomycin and zosyn for aspiration pneumonia.  Goal of Therapy:  Vancomycin trough 15-20 Appropriate antibiotic dosing for indication and renal function; eradication of infection.  Today, 12/19: D#7 vancomycin 1250 mg IV q8h D#7 Zosyn 3.375 grams IV q8h (extended-infusion) Afebrile Leukocytosis resolved Serum creatinine WNL but  increasing Vancomycin trough supratherapeutic    Plan:  1. Reduce vancomycin to 1000 mg IV q12h, next dose 8pm  2. Continue Zosyn 3.375 grams IV q8h (extended-infusion) 3. Follow renal function, cultures, clinical course. 4. Await word on planned duration of therapy.   Clayburn Pert, PharmD, BCPS Pager: [phone] 04/16/2014  1:48 PM

## 2014-04-17 ENCOUNTER — Other Ambulatory Visit: Payer: Self-pay | Admitting: Oncology

## 2014-04-17 DIAGNOSIS — R112 Nausea with vomiting, unspecified: Secondary | ICD-10-CM | POA: Insufficient documentation

## 2014-04-17 DIAGNOSIS — R1110 Vomiting, unspecified: Secondary | ICD-10-CM

## 2014-04-17 LAB — CULTURE, BLOOD (ROUTINE X 2)
CULTURE: NO GROWTH
Culture: NO GROWTH

## 2014-04-17 LAB — BASIC METABOLIC PANEL
ANION GAP: 13 (ref 5–15)
BUN: 18 mg/dL (ref 6–23)
CHLORIDE: 103 meq/L (ref 96–112)
CO2: 24 mEq/L (ref 19–32)
CREATININE: 1.09 mg/dL (ref 0.50–1.35)
Calcium: 9.6 mg/dL (ref 8.4–10.5)
GFR calc Af Amer: 83 mL/min — ABNORMAL LOW (ref >90)
GFR calc non Af Amer: 72 mL/min — ABNORMAL LOW (ref >90)
Glucose, Bld: 154 mg/dL — ABNORMAL HIGH (ref 70–99)
Potassium: 4.4 mEq/L (ref 3.7–5.3)
Sodium: 140 mEq/L (ref 137–147)

## 2014-04-17 LAB — CBC
HCT: 43.8 % (ref 39.0–52.0)
HEMOGLOBIN: 14.6 g/dL (ref 13.0–17.0)
MCH: 29.1 pg (ref 26.0–34.0)
MCHC: 33.3 g/dL (ref 30.0–36.0)
MCV: 87.3 fL (ref 78.0–100.0)
Platelets: 312 10*3/uL (ref 150–400)
RBC: 5.02 MIL/uL (ref 4.22–5.81)
RDW: 13.3 % (ref 11.5–15.5)
WBC: 14 10*3/uL — ABNORMAL HIGH (ref 4.0–10.5)

## 2014-04-17 LAB — GLUCOSE, CAPILLARY
GLUCOSE-CAPILLARY: 132 mg/dL — AB (ref 70–99)
Glucose-Capillary: 126 mg/dL — ABNORMAL HIGH (ref 70–99)
Glucose-Capillary: 141 mg/dL — ABNORMAL HIGH (ref 70–99)

## 2014-04-17 MED ORDER — TRACE MINERALS CR-CU-F-FE-I-MN-MO-SE-ZN IV SOLN
INTRAVENOUS | Status: AC
  Administered 2014-04-17: 18:00:00 via INTRAVENOUS
  Filled 2014-04-17: qty 1000

## 2014-04-17 MED ORDER — OSMOLITE 1.5 CAL PO LIQD
1000.0000 mL | ORAL | Status: DC
  Administered 2014-04-17: 1000 mL
  Filled 2014-04-17 (×2): qty 1000

## 2014-04-17 NOTE — Progress Notes (Signed)
PARENTERAL NUTRITION CONSULT NOTE  Pharmacy Consult for TPN Indication: Intolerance to enteral feeding  No Known Allergies  Patient Measurements: Height: 5\' 11"  (180.3 cm) Weight: 174 lb 14.4 oz (79.334 kg) IBW/kg (Calculated) : 75.3 Adjusted Body Weight: 76.5 kg  Vital Signs: Temp: 97.5 F (36.4 C) (12/20 0431) Temp Source: Oral (12/20 0431) BP: 122/84 mmHg (12/20 0431) Pulse Rate: 91 (12/20 0431) Intake/Output from previous day: 12/19 0701 - 12/20 0700 In: 240 [P.O.:240] Out: 2925 [Urine:2025; Emesis/NG output:900] Intake/Output from this shift:    Labs:  Recent Labs  04/17/14 0545  WBC 14.0*  HGB 14.6  HCT 43.8  PLT 312     Recent Labs  04/15/14 0544 04/16/14 0650 04/17/14 0545  NA 135* 139 140  K 3.7 4.8 4.4  CL 98 101 103  CO2 25 26 24   GLUCOSE 133* 137* 154*  BUN 10 14 18   CREATININE 0.80 1.08 1.09  CALCIUM 9.1 9.8 9.6  MG 2.1 2.4  --   PHOS 3.8 4.2  --   Corrected Ca = 10.9 Estimated Creatinine Clearance: 76.8 mL/min (by C-G formula based on Cr of 1.09).    Recent Labs  04/16/14 1744 04/16/14 2346 04/17/14 0648  GLUCAP 126* 145* 132*    Medical History: Past Medical History  Diagnosis Date  . Esophageal cancer 03/29/14   Insulin Requirements:   4 units sensitive SSI Novolog used in past 24h  Current Nutrition:    NPO  Clinimix E 5/15 @ 50 ml/hr  20% fat emulsion @ 8 ml/hr  Osmolite 1.5 via J-tube at 50 ml/hr  IVF:  NS with KCL 20 mEq at 50 mL/hr  Central access: PICC placed 04/12/14 TPN start date: 04/12/14  ASSESSMENT                                                                                                          HPI:  58 y/o M with PMH of alcohol abuse (sober for last 9 years), chronic back and neck pain, and esophageal cancer with complete esophagus obstruction causing dysphagia. Admitted from [facility] on 12/8 due to inability to eat or drink and dehydration. He is s/p open feeding jejunostomy tube on 12/9 and  developed significant n/v after TF started. NGT inserted in IR 12/13 with immediate output. Pt developed fever, chills, tachycardia and hypoxia. CT of chest showed bilateral aspiration pneumonia, for which he is on broad spectrum antibiotics, Vancomycin and Zosyn. J tube became clogged, so patient ordered to be transitioned from tube feeds to TPN for nutrition, with pharmacy to assist with dosing.    SIGNIFICANT EVENTS  12/16: Surgery MD states waiting for return of bowel function before restarting tube feeds 12/18: Surgery starting trickle TFs via J-tube, increase based on toleration. NGT still with copious output 12/19: Surgery increased Osmolite 1.5 to 30 mL/hr via J-tube. TPN reduced to 50 mL/hr.  12/20: Surgery increased Osmolite 1.5 to 50 mL/hr via J-tube, TPN rate reduced and lipids d/c.  Today, 04/17/2014:  Glucose - CBGs at goal < 150 on minimal SSI coverage  Electrolytes - WNL.  Renal - SCr WNL but up slightly.  LFTs - ALT and T.Bili slightly elevated but <1.5x ULN (12/17)  TGs - WNL (12/16)  Prealbumin - 6.5 (12/16)  Intake not documented, NG output documented at 900 ml (12/19)  NUTRITIONAL GOALS                                                                                             RD recs:  Kcal: 2000-2200 Protein: 105-115g Fluid: 2L/day  Clinimix E 5/15 at a goal rate of 90 ml/hr + 20% fat emulsion at 10 ml/hr to provide: 108 g/day protein, 2013 Kcal/day.  12/20 Alternative TF + TPN regimen:  Osmolite 1.5 at 50 ml/hr = 75g protein and 1800 kcal  Reduced rate Clinimix 5/15 at 40 ml/hr = 48 g protein, 682 kcal  NO FAT EMULSION  Total: 123 g protein and 2480 kcal   PLAN                                                                                                                     1. Reduce Clinimix E 5/15 to 40 ml/hr.  TPN to contain standard multivitamins and  trace elements. 2. Discontinue fat emulsion 3. Continue sensitive Scale SSI and CBG checks q6h.  4. TPN lab panels on Mondays & Thursdays. 5. Continue mIVF per MD: NS w/ KCl 20 mEq/L at 50 ml/hr. 6. Follow up enteral feedings and further reduce TPN rate if enteral feeds are tolerated/ advanced.  Surgery to specify when OK to discontinue TPN.  Please see above nutrition goals (pt currently exceeding goals on low TPN rate and no lipids) and clarify plans for TPN.   Gretta Arab PharmD, BCPS Pager [phone] 04/17/2014 9:47 AM

## 2014-04-17 NOTE — Progress Notes (Signed)
[facility] PROGRESS NOTE  [name] IPJ:[id number] DOB: 09/16/1955 DOA: 04/05/2014 PCP: Lujean Amel, MD  Admission history of present illness/brief narrative:   HPI: Billy Romero is a 58 y.o. 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 Admitted from [place] due to inability to eat or drink and dehydration. Patient reports that symptoms has been steadily progressing and worsening over the last week or two and currently having trouble with keeping down even his own saliva. Plan is for patient to be admitted for IVF's resuscitation and also placement of J tube for tube feedings and nutrition. Patient had Placement open feeding jejunostomy tube by Dr. Scherrie Merritts 12/9. He had significant nausea and vomiting after his tube feeds were started, CT abdomen with contrast showing evidence of narrowing at the insertion type, as well having a kink at the insertion site . Given patient continuous nausea and vomiting, he had nasogastric tube inserted via fluoroscopic guidance by radiology, patient had 3600 mL output within half an hour from tube insertion, patient developed fever, hypoxia, tachycardia, CT chest showing evidence of bilateral aspiration pneumonia. Patient is improving on IV vancomycin and Zosyn, unfortunately his J-tube remains clogged, so he is being started on TPN.      Assessment/Plan: Dysphagia, pharyngoesophageal phase:  -secondary to complete obstruction of esophagus due to cancer. -continue IVF's resuscitation -Placement open feeding jejunostomy tube by Dr. Armandina Gemma 12/9 -electrolytes repletion as needed  Nausea and vomiting / Small bowel obstruction at J-tube insertion site./Probable ileus -Secondary to Jejunum narrowing, and kink at the insertion site, patient is  known to have history of congenital partial malrotation. - NG tube via fluoroscopic guidance inserted by radiology 12/13 , with significant improvement of symptoms . -UGI shows tube is not obstructing the lumen and thus possible ileus (patient has hypokalemia). Keep potassium greater than 4. Keep magnesium greater than 2. General surgery is following and appreciate input and recommendations.  Aspiration pneumonia  -Most likely related to copious amount of nausea and vomiting secondary to obstruction . -Continue with IV  Zosyn (12/13 ). D/C IV Vancomycin. -No further hypoxia .  Esophagus cancer:  -Following with oncology as an outpatient -plan is for radiation , radiation oncology follow-up appreciated. -CT surgery follow-up is appreciated.  Chronic back pain: - Continue current pain regimen and muscle relaxants   Protein-calorie malnutrition, moderate: - due to decrease PO intake and cancer. -dietician has been consulted - Continue TPN Once ileus resolves may convert to entereal feeds through J-tube. Patient has been started on feeds which is slowly being uptitrated per general surgery.   Hypokalemia -Replete. Keep potassium greater than 4.   Code Status: Full Family Communication: Updated patient and no family at bedside. Disposition Plan: Home when medically stable.   Consultants:  Gen. surgery Dr.Gerkin 04/05/2014  Radiation oncology: Dr. Lisbeth Renshaw 04/06/2014  Procedures: -Placement open feeding jejunostomy tube by Dr. Scherrie Merritts 12/9 - NG tube inserted by radiology via fluoroscopic guidance 04/10/14 CT abdomen and pelvis 04/08/2014 CT chest 04/10/2014 Abdominal x-ray 04/08/2014, 04/12/2014 Water-soluble upper GI series 04/11/2014    Antibiotics:  IV vancomycin 04/10/2014>>>>04/16/14  IV Zosyn 04/10/2014>>>>>04/17/14  HPI/Subjective: Patient denies any emesis. Patient states as long as NG tube is in place no further nausea or emesis. Some improvement in  abdominal pain. No bowel movement. Patient with flatus.  Objective:  Filed Vitals:   04/17/14 1315  BP: 133/88  Pulse: 83  Temp: 98.1 F (36.7 C)  Resp: 18    Intake/Output Summary (Last 24 hours) at 04/17/14 1646 Last data filed at 04/17/14 1437  Gross per 24 hour  Intake    480 ml  Output   2775 ml  Net  -2295 ml   Filed Weights   04/15/14 0534 04/16/14 0522 04/17/14 0431  Weight: 80.241 kg (176 lb 14.4 oz) 79.833 kg (176 lb) 79.334 kg (174 lb 14.4 oz)    Exam:   General:  NAD. NG tube in place.  Cardiovascular: RRR  Respiratory: CTAB  Abdomen: Soft, J-tube in place. Nondistended. Positive bowel sounds.  Musculoskeletal: No clubbing cyanosis or edema.  Data Reviewed: Basic Metabolic Panel:  Recent Labs Lab 04/12/14 0343 04/13/14 0600 04/13/14 1512 04/14/14 0800 04/15/14 0544 04/16/14 0650 04/17/14 0545  NA 139 137 135* 133* 135* 139 140  K 3.2* 3.0* 3.5* 3.5* 3.7 4.8 4.4  CL 102 99 98 97 98 101 103  CO2 27 27 25 25 25 26 24   GLUCOSE 109* 126* 123* 137* 133* 137* 154*  BUN 18 13 11 9 10 14 18   CREATININE 0.89 0.74 0.76 0.75 0.80 1.08 1.09  CALCIUM 9.1 8.8 8.9 8.8 9.1 9.8 9.6  MG 2.0 2.0  --  2.1 2.1 2.4  --   PHOS 2.7 2.6  --  2.9 3.8 4.2  --    Liver Function Tests:  Recent Labs Lab 04/10/14 1834 04/12/14 0343 04/13/14 0600 04/14/14 0800  AST 74*  --  14 31  ALT 178*  --  52 56*  ALKPHOS 81  --  65 76  BILITOT 1.8* 1.1 0.9 1.4*  PROT 6.5  --  6.0 6.2  ALBUMIN 3.5  --  2.7* 2.6*   No results for input(s): LIPASE, AMYLASE in the last 168 hours. No results for input(s): AMMONIA in the last 168 hours. CBC:  Recent Labs Lab 04/11/14 0330 04/12/14 0343 04/13/14 0600 04/14/14 0800 04/17/14 0545  WBC 11.4* 10.5 11.4* 9.5 14.0*  NEUTROABS  --   --  9.4*  --   --   HGB 14.5 13.3 12.6* 13.3 14.6  HCT 41.8 39.4 36.8* 38.6* 43.8  MCV 84.6 86.0 84.4 85.0 87.3  PLT 163 152 182 209 312   Cardiac Enzymes: No results for input(s): CKTOTAL,  CKMB, CKMBINDEX, TROPONINI in the last 168 hours. BNP (last 3 results) No results for input(s): PROBNP in the last 8760 hours. CBG:  Recent Labs Lab 04/16/14 1129 04/16/14 1744 04/16/14 2346 04/17/14 0648 04/17/14 1135  GLUCAP 129* 126* 145* 132* 126*    Recent Results (from the past 240 hour(s))  Culture, blood (routine x 2)     Status: None   Collection Time: 04/10/14  6:05 PM  Result Value Ref Range Status   Specimen Description BLOOD LEFT HAND  Final   Special Requests BOTTLES DRAWN AEROBIC AND ANAEROBIC 5CC  Final   Culture  Setup Time   Final    04/11/2014 03:11 Performed at [facility]    Culture   Final    NO GROWTH 5 DAYS Performed at [facility]    Report Status 04/17/2014 FINAL  Final  Culture, blood (routine x 2)     Status: None   Collection Time: 04/10/14  6:27 PM  Result Value Ref Range Status   Specimen Description BLOOD LEFT ARM  Final   Special Requests BOTTLES DRAWN AEROBIC  AND ANAEROBIC 10CC  Final   Culture  Setup Time   Final    04/11/2014 03:11 Performed at [facility]    Culture   Final    NO GROWTH 5 DAYS Note: Culture results may be compromised due to an excessive volume of blood received in culture bottles. Performed at [facility]    Report Status 04/17/2014 FINAL  Final  MRSA PCR Screening     Status: None   Collection Time: 04/10/14 10:02 PM  Result Value Ref Range Status   MRSA by PCR NEGATIVE NEGATIVE Final    Comment:        The GeneXpert MRSA Assay (FDA approved for NASAL specimens only), is one component of a comprehensive MRSA colonization surveillance program. It is not intended to diagnose MRSA infection nor to guide or monitor treatment for MRSA infections.   Culture, Urine     Status: None   Collection Time: 04/12/14  2:08 AM  Result Value Ref Range Status   Specimen Description URINE, CLEAN CATCH  Final   Special Requests none Normal  Final   Culture  Setup Time   Final     04/12/2014 11:21 Performed at [place] Performed at [facility]   Final   Culture NO GROWTH Performed at [facility]   Final   Report Status 04/13/2014 FINAL  Final     Studies: No results found.  Scheduled Meds: . antiseptic oral rinse  7 mL Mouth Rinse q12n4p  . chlorhexidine  15 mL Mouth Rinse BID  . enoxaparin (LOVENOX) injection  40 mg Subcutaneous Q24H  . feeding supplement (OSMOLITE 1.5 CAL)  1,000 mL Per Tube Q24H  . insulin aspart  0-9 Units Subcutaneous 4 times per day  . LORazepam  1 mg Intravenous Once  . pantoprazole (PROTONIX) IV  40 mg Intravenous Q12H  . piperacillin-tazobactam (ZOSYN)  IV  3.375 g Intravenous Q8H  . sodium chloride  10-40 mL Intracatheter Q12H   Continuous Infusions: . Marland KitchenTPN (CLINIMIX-E) Adult    . 0.9 % NaCl with KCl 20 mEq / L 50 mL/hr at 04/17/14 1114  . Marland KitchenTPN (CLINIMIX-E) Adult 50 mL/hr at 04/16/14 1724   And  . fat emulsion 250 mL (04/16/14 1724)    Principal Problem:   Dysphagia, pharyngoesophageal phase Active Problems:   Esophagus cancer   Chronic back pain   Protein-calorie malnutrition, severe   Dysphagia   Malnutrition of moderate degree   Esophageal carcinoma   SBO (small bowel obstruction)   Ileus, postoperative    Time spent: 68 minutes    Alverto Shedd M.D. [facility] Pager [phone]. If 7PM-7AM, please contact night-coverage at [email or web address], password [facility] 04/17/2014, 4:46 PM  LOS: 12 days

## 2014-04-17 NOTE — Progress Notes (Signed)
11 Days Post-Op  Subjective: Passing gas occasionally.  No BM.  Objective: Vital signs in last 24 hours: Temp:  [97.5 F (36.4 C)-98.1 F (36.7 C)] 97.5 F (36.4 C) (12/20 0431) Pulse Rate:  [91-98] 91 (12/20 0431) Resp:  [16-18] 16 (12/20 0431) BP: (122-144)/(84-94) 122/84 mmHg (12/20 0431) SpO2:  [96 %-98 %] 98 % (12/20 0431) Weight:  [174 lb 14.4 oz (79.334 kg)] 174 lb 14.4 oz (79.334 kg) (12/20 0431) Last BM Date: 04/07/14  Intake/Output from previous day: 12/19 0701 - 12/20 0700 In: 240 [P.O.:240] Out: 2925 [Urine:2025; Emesis/NG output:900] Intake/Output this shift:    PE: General- In NAD Abdomen-soft, flat, not tender, occasional bowel sound, incision clean and intact, j-tube on left   Lab Results:   Recent Labs  04/14/14 0800 04/17/14 0545  WBC 9.5 14.0*  HGB 13.3 14.6  HCT 38.6* 43.8  PLT 209 312   BMET  Recent Labs  04/16/14 0650 04/17/14 0545  NA 139 140  K 4.8 4.4  CL 101 103  CO2 26 24  GLUCOSE 137* 154*  BUN 14 18  CREATININE 1.08 1.09  CALCIUM 9.8 9.6   PT/INR No results for input(s): LABPROT, INR in the last 72 hours. Comprehensive Metabolic Panel:    Component Value Date/Time   NA 140 04/17/2014 0545   NA 139 04/16/2014 0650   K 4.4 04/17/2014 0545   K 4.8 04/16/2014 0650   CL 103 04/17/2014 0545   CL 101 04/16/2014 0650   CO2 24 04/17/2014 0545   CO2 26 04/16/2014 0650   BUN 18 04/17/2014 0545   BUN 14 04/16/2014 0650   CREATININE 1.09 04/17/2014 0545   CREATININE 1.08 04/16/2014 0650   GLUCOSE 154* 04/17/2014 0545   GLUCOSE 137* 04/16/2014 0650   CALCIUM 9.6 04/17/2014 0545   CALCIUM 9.8 04/16/2014 0650   AST 31 04/14/2014 0800   AST 14 04/13/2014 0600   ALT 56* 04/14/2014 0800   ALT 52 04/13/2014 0600   ALKPHOS 76 04/14/2014 0800   ALKPHOS 65 04/13/2014 0600   BILITOT 1.4* 04/14/2014 0800   BILITOT 0.9 04/13/2014 0600   PROT 6.2 04/14/2014 0800   PROT 6.0 04/13/2014 0600   ALBUMIN 2.6* 04/14/2014 0800   ALBUMIN  2.7* 04/13/2014 0600     Studies/Results: No results found.  Anti-infectives: Anti-infectives    Start     Dose/Rate Route Frequency Ordered Stop   04/16/14 2000  vancomycin (VANCOCIN) IVPB 1000 mg/200 mL premix  Status:  Discontinued     1,000 mg200 mL/hr over 60 Minutes Intravenous Every 12 hours 04/16/14 1354 04/16/14 1727   04/12/14 2200  vancomycin (VANCOCIN) 1,250 mg in sodium chloride 0.9 % 250 mL IVPB  Status:  Discontinued     1,250 mg166.7 mL/hr over 90 Minutes Intravenous Every 8 hours 04/12/14 1455 04/16/14 1336   04/11/14 0600  piperacillin-tazobactam (ZOSYN) IVPB 3.375 g     3.375 g12.5 mL/hr over 240 Minutes Intravenous Every 8 hours 04/10/14 2111     04/11/14 0600  vancomycin (VANCOCIN) IVPB 1000 mg/200 mL premix  Status:  Discontinued     1,000 mg200 mL/hr over 60 Minutes Intravenous Every 8 hours 04/10/14 2111 04/12/14 1454   04/11/14 0400  piperacillin-tazobactam (ZOSYN) IVPB 3.375 g  Status:  Discontinued     3.375 g12.5 mL/hr over 240 Minutes Intravenous Every 8 hours 04/10/14 1910 04/10/14 2110   04/11/14 0400  vancomycin (VANCOCIN) IVPB 1000 mg/200 mL premix  Status:  Discontinued     1,000  mg200 mL/hr over 60 Minutes Intravenous Every 8 hours 04/10/14 1910 04/10/14 2110   04/10/14 2130  piperacillin-tazobactam (ZOSYN) IVPB 3.375 g     3.375 g100 mL/hr over 30 Minutes Intravenous  Once 04/10/14 2111 04/10/14 2155   04/10/14 2130  vancomycin (VANCOCIN) IVPB 1000 mg/200 mL premix     1,000 mg200 mL/hr over 60 Minutes Intravenous  Once 04/10/14 2111 04/10/14 2225   04/10/14 1800  vancomycin (VANCOCIN) IVPB 1000 mg/200 mL premix  Status:  Discontinued     1,000 mg200 mL/hr over 60 Minutes Intravenous  Once 04/10/14 1741 04/10/14 2110   04/10/14 1800  piperacillin-tazobactam (ZOSYN) IVPB 3.375 g  Status:  Discontinued     3.375 g12.5 mL/hr over 240 Minutes Intravenous  Once 04/10/14 1741 04/10/14 2110   04/06/14 1245  cefOXitin (MEFOXIN) 2 g in dextrose 5 % 50 mL IVPB      2 g100 mL/hr over 30 Minutes Intravenous On call to O.R. 04/06/14 1231 04/06/14 1305      Assessment Obstructing esophageal cancer s/p open jejunostomy tube placement 04/06/14-ng output down some;  UGI shows tube is not completely obstructing lumen.    Postop ileus-still present; less ng output   CT of chest suggests aspiration pneumonia and abxs started.    PC Malnutrition-TPN started; tolerated low rate tube feeds.   LOS: 12 days   Plan:  Increase tube feeds.  Leave ng in.   Salahuddin Arismendez J 04/17/2014

## 2014-04-18 ENCOUNTER — Inpatient Hospital Stay (HOSPITAL_COMMUNITY): Payer: BC Managed Care – PPO

## 2014-04-18 LAB — DIFFERENTIAL
Basophils Absolute: 0.1 10*3/uL (ref 0.0–0.1)
Basophils Relative: 0 % (ref 0–1)
EOS PCT: 3 % (ref 0–5)
Eosinophils Absolute: 0.4 10*3/uL (ref 0.0–0.7)
LYMPHS ABS: 1.6 10*3/uL (ref 0.7–4.0)
Lymphocytes Relative: 13 % (ref 12–46)
Monocytes Absolute: 0.9 10*3/uL (ref 0.1–1.0)
Monocytes Relative: 7 % (ref 3–12)
Neutro Abs: 9.8 10*3/uL — ABNORMAL HIGH (ref 1.7–7.7)
Neutrophils Relative %: 77 % (ref 43–77)

## 2014-04-18 LAB — CBC
HCT: 45.1 % (ref 39.0–52.0)
HEMOGLOBIN: 14.3 g/dL (ref 13.0–17.0)
MCH: 28.8 pg (ref 26.0–34.0)
MCHC: 31.7 g/dL (ref 30.0–36.0)
MCV: 90.7 fL (ref 78.0–100.0)
Platelets: 395 10*3/uL (ref 150–400)
RBC: 4.97 MIL/uL (ref 4.22–5.81)
RDW: 13.6 % (ref 11.5–15.5)
WBC: 12.8 10*3/uL — ABNORMAL HIGH (ref 4.0–10.5)

## 2014-04-18 LAB — COMPREHENSIVE METABOLIC PANEL
ALBUMIN: 2.8 g/dL — AB (ref 3.5–5.2)
ALK PHOS: 120 U/L — AB (ref 39–117)
ALT: 150 U/L — ABNORMAL HIGH (ref 0–53)
ALT: 183 U/L — AB (ref 0–53)
ANION GAP: 12 (ref 5–15)
AST: 72 U/L — AB (ref 0–37)
AST: 87 U/L — ABNORMAL HIGH (ref 0–37)
Albumin: 3.3 g/dL — ABNORMAL LOW (ref 3.5–5.2)
Alkaline Phosphatase: 141 U/L — ABNORMAL HIGH (ref 39–117)
Anion gap: 13 (ref 5–15)
BILIRUBIN TOTAL: 1 mg/dL (ref 0.3–1.2)
BUN: 22 mg/dL (ref 6–23)
BUN: 23 mg/dL (ref 6–23)
CALCIUM: 10.5 mg/dL (ref 8.4–10.5)
CHLORIDE: 100 meq/L (ref 96–112)
CO2: 22 mEq/L (ref 19–32)
CO2: 24 mEq/L (ref 19–32)
CREATININE: 1.17 mg/dL (ref 0.50–1.35)
Calcium: 9.9 mg/dL (ref 8.4–10.5)
Chloride: 101 mEq/L (ref 96–112)
Creatinine, Ser: 1.14 mg/dL (ref 0.50–1.35)
GFR calc Af Amer: 79 mL/min — ABNORMAL LOW (ref >90)
GFR calc Af Amer: 77 mL/min — ABNORMAL LOW (ref >90)
GFR calc non Af Amer: 68 mL/min — ABNORMAL LOW (ref >90)
GFR, EST NON AFRICAN AMERICAN: 66 mL/min — AB (ref >90)
Glucose, Bld: 635 mg/dL (ref 70–99)
Glucose, Bld: 127 mg/dL — ABNORMAL HIGH (ref 70–99)
Potassium: 6 mEq/L — ABNORMAL HIGH (ref 3.7–5.3)
Potassium: 4.9 mEq/L (ref 3.7–5.3)
SODIUM: 138 meq/L (ref 137–147)
Sodium: 134 mEq/L — ABNORMAL LOW (ref 137–147)
TOTAL PROTEIN: 7.8 g/dL (ref 6.0–8.3)
Total Bilirubin: 1 mg/dL (ref 0.3–1.2)
Total Protein: 6.9 g/dL (ref 6.0–8.3)

## 2014-04-18 LAB — GLUCOSE, CAPILLARY
GLUCOSE-CAPILLARY: 144 mg/dL — AB (ref 70–99)
GLUCOSE-CAPILLARY: 155 mg/dL — AB (ref 70–99)
GLUCOSE-CAPILLARY: 168 mg/dL — AB (ref 70–99)
Glucose-Capillary: 128 mg/dL — ABNORMAL HIGH (ref 70–99)

## 2014-04-18 LAB — PHOSPHORUS
PHOSPHORUS: 3.7 mg/dL (ref 2.3–4.6)
Phosphorus: 6.6 mg/dL — ABNORMAL HIGH (ref 2.3–4.6)

## 2014-04-18 LAB — TRIGLYCERIDES: Triglycerides: 67 mg/dL (ref <150)

## 2014-04-18 LAB — MAGNESIUM
MAGNESIUM: 2.6 mg/dL — AB (ref 1.5–2.5)
Magnesium: 2.7 mg/dL — ABNORMAL HIGH (ref 1.5–2.5)

## 2014-04-18 LAB — PREALBUMIN: Prealbumin: 16.8 mg/dL — ABNORMAL LOW (ref 17.0–34.0)

## 2014-04-18 LAB — GLUCOSE, RANDOM: Glucose, Bld: 140 mg/dL — ABNORMAL HIGH (ref 70–99)

## 2014-04-18 MED ORDER — SODIUM CHLORIDE 0.9 % IV SOLN
INTRAVENOUS | Status: DC
  Administered 2014-04-18 – 2014-04-21 (×3): via INTRAVENOUS

## 2014-04-18 MED ORDER — TRACE MINERALS CR-CU-F-FE-I-MN-MO-SE-ZN IV SOLN
INTRAVENOUS | Status: AC
  Administered 2014-04-18: 17:00:00 via INTRAVENOUS
  Filled 2014-04-18: qty 1000

## 2014-04-18 MED ORDER — OSMOLITE 1.5 CAL PO LIQD
1000.0000 mL | ORAL | Status: DC
  Administered 2014-04-19 – 2014-04-21 (×3): 1000 mL
  Filled 2014-04-18 (×3): qty 1000

## 2014-04-18 NOTE — Progress Notes (Signed)
PARENTERAL NUTRITION CONSULT NOTE  Pharmacy Consult for TPN Indication: Intolerance to enteral feeding  No Known Allergies  Patient Measurements: Height: 5' 11" (180.3 cm) Weight: 173 lb 11.2 oz (78.79 kg) IBW/kg (Calculated) : 75.3 Adjusted Body Weight: 76.5 kg  Vital Signs: Temp: 97.6 F (36.4 C) (12/21 0537) Temp Source: Oral (12/21 0537) BP: 125/87 mmHg (12/21 0537) Pulse Rate: 87 (12/21 0537) Intake/Output from previous day: 12/20 0701 - 12/21 0700 In: 360 [P.O.:360] Out: 2850 [Urine:1350; Emesis/NG output:1500] Intake/Output from this shift: Total I/O In: -  Out: 200 [Urine:200]  Labs:  Recent Labs  04/17/14 0545 04/18/14 0535  WBC 14.0* 12.8*  HGB 14.6 14.3  HCT 43.8 45.1  PLT 312 395     Recent Labs  04/16/14 0650 04/17/14 0545 04/18/14 0535 04/18/14 0658 04/18/14 0949  NA 139 140 134*  --  138  K 4.8 4.4 6.0*  --  4.9  CL 101 103 100  --  101  CO2 _0 --  24  GLUCOSE 137* 154* 635* 140* 127*  BUN _1 --  23  CREATININE 1.08 1.09 1.14  --  1.17  CALCIUM 9.8 9.6 9.9  --  10.5  MG 2.4  --  2.7*  --  2.6*  PHOS 4.2  --  6.6*  --  3.7  PROT  --   --  6.9  --  7.8  ALBUMIN  --   --  2.8*  --  3.3*  AST  --   --  72*  --  87*  ALT  --   --  150*  --  183*  ALKPHOS  --   --  120*  --  141*  BILITOT  --   --  1.0  --  1.0  TRIG  --   --  67  --   --   Corrected Ca = 10.9 Estimated Creatinine Clearance: 71.5 mL/min (by C-G formula based on Cr of 1.17).    Recent Labs  04/17/14 1708 04/18/14 0042 04/18/14 0533  GLUCAP 141* 128* 144*    Medical History: Past Medical History  Diagnosis Date  . Esophageal cancer 03/29/14   Insulin Requirements:   3 units sensitive SSI Novolog used in past 24h  Current Nutrition:    NPO  Clinimix E 5/15 @ 40 ml/hr  Osmolite 1.5 via J-tube at 55 ml/hr (at goal rate)  IVF:  NS at 40 mL/hr  Central access: PICC placed 04/12/14 TPN start date: 04/12/14  ASSESSMENT                                                                                                           HPI:  58 y/o M with PMH of alcohol abuse (sober for last 9 years), chronic back and neck pain, and esophageal cancer with complete esophagus obstruction causing dysphagia. Admitted from [facility] on 12/8 due to inability to eat or drink and dehydration. He is s/p open feeding jejunostomy tube on 12/9 and developed significant n/v after TF started. NGT  inserted in IR 12/13 with immediate output. Pt developed fever, chills, tachycardia and hypoxia. CT of chest showed bilateral aspiration pneumonia, for which he is on broad spectrum antibiotics, Vancomycin and Zosyn. J tube became clogged, so patient ordered to be transitioned from tube feeds to TPN for nutrition, with pharmacy to assist with dosing.    SIGNIFICANT EVENTS                                                                                              12/16: Surgery MD states waiting for return of bowel function before restarting tube feeds 12/18: Surgery starting trickle TFs via J-tube, increase based on toleration. NGT still with copious output 12/19: Surgery increased Osmolite 1.5 to 30 mL/hr via J-tube. TPN reduced to 50 mL/hr.  12/20: Surgery increased Osmolite 1.5 to 50 mL/hr via J-tube, TPN rate reduced and lipids d/c. 12/21: Surgery increased Osmolite 1.5 to 55 mL/hr via J-tube (goal rate), requested for TPN to continue despite patient receiving >120% of their caloric and protein goals  Today, 04/18/2014:  Glucose - CBGs at goal < 150 on minimal SSI coverage  Electrolytes - K up to 4.9, Phos ok at 3.7, Mag high at 2.6  Renal - SCr WNL but continues to trend up  LFTs - New elevations of LFTs and Alk Phos (possibly due to TPN)  TGs - remain WNL   Prealbumin - 6.5 (12/16)  Intake not documented, NG output documented at 1500 ml (12/20)  NUTRITIONAL GOALS                                                                                             RD  recs:  Kcal: 2000-2200 Protein: 105-115g Fluid: 2L/day  Clinimix E 5/15 at a goal rate of 90 ml/hr + 20% fat emulsion at 10 ml/hr to provide: 108 g/day protein, 2013 Kcal/day.  12/21: TF + TPN:  Osmolite 1.5 at 55 ml/hr = 82.8g protein and 1980 kcal  Reduced rate Clinimix 5/15 at 40 ml/hr = 48 g protein, 682 kcal  NO FAT EMULSION Total: 130.8 g protein (127% of estimated needs) and 2662 kcal (126% of estimated needs)   PLAN                                                                                                                       1. Continue Clinimix 5/15 at 40 ml/hr but will use formulation WITHOUT electrolytes as patient receiving electrolytes in TFs  TPN to contain standard multivitamins and trace elements. 2. No fat emulsion 3. Continue sensitive Scale SSI and CBG checks q6h.  4. TPN lab panels on Mondays & Thursdays. 5. Cmet, Mag, Phos in AM 5. Continue mIVF NS at 40 ml/hr 6. Follow up enteral feedings toleration  Surgery to specify when OK to discontinue TPN.    Please see above nutrition goals (pt currently exceeding goals on low TPN rate and no lipids) and clarify plans for TPN.  Thank you for the consult.  Jesse  Akers, PharmD, BCPS Pager: [phone] Pharmacy: [phone] 04/18/2014 1:06 PM   

## 2014-04-18 NOTE — Progress Notes (Signed)
12 Days Post-Op  Subjective: He is fairly frustrated.  He says he gets nauseated whenever they clamp his NG.  He is blaming the feeding tube, but what is in the [place] is all gastric content, clear green.  I do not see any tube feeding in the current cannister.  He is also anxious to know when we are going to start the his cancer therapy.   Tube feeding is at 50 ml per hour.     Objective: Vital signs in last 24 hours: Temp:  [97.6 F (36.4 C)-99.2 F (37.3 C)] 97.6 F (36.4 C) (12/21 0537) Pulse Rate:  [81-87] 87 (12/21 0537) Resp:  [16-18] 16 (12/21 0537) BP: (125-133)/(87-89) 125/87 mmHg (12/21 0537) SpO2:  [98 %-100 %] 98 % (12/21 0537) Weight:  [78.79 kg (173 lb 11.2 oz)] 78.79 kg (173 lb 11.2 oz) (12/21 0537) Last BM Date: 04/07/14 360 PO recorded. 1500 from NG NPO Afebrile, VSS K+6.0, AST/ALT - 72/150 WBC down to 12.8 NO films . Marland KitchenTPN (CLINIMIX-E) Adult 40 mL/hr at 04/17/14 1735   Intake/Output from previous day: 12/20 0701 - 12/21 0700 In: 360 [P.O.:360] Out: 2850 [Urine:1350; Emesis/NG output:1500] Intake/Output this shift:    General appearance: alert, cooperative and no distress Resp: clear to auscultation bilaterally GI: soft, he is not distended at all, his feeding tube and incision both look fine.  NG  drainage is clear and green.  Lab Results:   Recent Labs  04/17/14 0545 04/18/14 0535  WBC 14.0* 12.8*  HGB 14.6 14.3  HCT 43.8 45.1  PLT 312 395    BMET  Recent Labs  04/17/14 0545 04/18/14 0535 04/18/14 0658  NA 140 134*  --   K 4.4 6.0*  --   CL 103 100  --   CO2 24 22  --   GLUCOSE 154* 635* 140*  BUN 18 22  --   CREATININE 1.09 1.14  --   CALCIUM 9.6 9.9  --    PT/INR No results for input(s): LABPROT, INR in the last 72 hours.   Recent Labs Lab 04/12/14 0343 04/13/14 0600 04/14/14 0800 04/18/14 0535  AST  --  14 31 72*  ALT  --  52 56* 150*  ALKPHOS  --  65 76 120*  BILITOT 1.1 0.9 1.4* 1.0  PROT  --  6.0 6.2 6.9  ALBUMIN   --  2.7* 2.6* 2.8*     Lipase  No results found for: LIPASE   Studies/Results: No results found.  Medications: . antiseptic oral rinse  7 mL Mouth Rinse q12n4p  . chlorhexidine  15 mL Mouth Rinse BID  . enoxaparin (LOVENOX) injection  40 mg Subcutaneous Q24H  . feeding supplement (OSMOLITE 1.5 CAL)  1,000 mL Per Tube Q24H  . insulin aspart  0-9 Units Subcutaneous 4 times per day  . LORazepam  1 mg Intravenous Once  . pantoprazole (PROTONIX) IV  40 mg Intravenous Q12H  . sodium chloride  10-40 mL Intracatheter Q12H    Assessment/Plan 1. Esophageal cancer with complete obstruction Placement open feeding jejunostomy tube, 04/06/2014, Armandina Gemma, MD  IR opened tube with wire12/15/15 IR NG tube placement 04/10/14 4 liters reportedly drained 2. post op nausea, vomiting and J tube obstruction 3. Post op pneumonia, most likely from aspiration 4.  Chronic back and neck pain 5.  Malnutrition  6.  Aspiration pneumonia   Plan:  I will let them increase his Tube feeding to goal which i think is 55 ml per hour on  a 24 hour basis.  I am not going to clamp his NG tube yet.  He continues to have a large output from here, but I don't think it is related to his J tube feedings.   Will review with Dr. Dalbert Batman.  He is 12 days out from his surgery, but I plan to leave in staples the full 14 days, unless he goes home before that.  I have told him he would have to speak with oncology to determine when they will start therapy for the cancer.    LOS: 13 days    Eddith Mentor 04/18/2014

## 2014-04-18 NOTE — Progress Notes (Signed)
[facility] PROGRESS NOTE  Billy Romero UKG:[id number] DOB: 07-12-1955 DOA: 04/05/2014 PCP: Lujean Amel, MD  Admission history of present illness/brief narrative:   HPI: Billy Romero is a 58 y.o. 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 Admitted from [place] due to inability to eat or drink and dehydration. Patient reports that symptoms has been steadily progressing and worsening over the last week or two and currently having trouble with keeping down even his own saliva. Plan is for patient to be admitted for IVF's resuscitation and also placement of J tube for tube feedings and nutrition. Patient had Placement open feeding jejunostomy tube by Dr. Scherrie Merritts 12/9. He had significant nausea and vomiting after his tube feeds were started, CT abdomen with contrast showing evidence of narrowing at the insertion type, as well having a kink at the insertion site . Given patient continuous nausea and vomiting, he had nasogastric tube inserted via fluoroscopic guidance by radiology, patient had 3600 mL output within half an hour from tube insertion, patient developed fever, hypoxia, tachycardia, CT chest showing evidence of bilateral aspiration pneumonia. Patient is improving on IV vancomycin and Zosyn, unfortunately his J-tube remains clogged, so he is being started on TPN. IV antibiotics have been discontinued.      Assessment/Plan: Dysphagia, pharyngoesophageal phase:  -secondary to complete obstruction of esophagus due to cancer. -continue IVF's resuscitation -Placement open feeding jejunostomy tube by Dr. Armandina Gemma 12/9 -electrolytes repletion as needed  Nausea and vomiting / Small bowel obstruction at J-tube insertion site./Probable ileus -Secondary to Jejunum narrowing, and  kink at the insertion site, patient is known to have history of congenital partial malrotation. - NG tube via fluoroscopic guidance inserted by radiology 12/13 , with significant improvement of symptoms . -UGI shows tube is not obstructing the lumen and thus possible ileus (patient has hypokalemia). Keep potassium greater than 4. Keep magnesium greater than 2. General surgery is following and appreciate input and recommendations.  Aspiration pneumonia  -Most likely related to copious amount of nausea and vomiting secondary to obstruction . -s/p IV  Zosyn (12/13>>>>04/17/14 ) -No further hypoxia .  Esophagus cancer:  -Following with oncology as an outpatient -plan is for radiation , radiation oncology follow-up appreciated. -CT surgery follow-up is appreciated.  Chronic back pain: - Continue current pain regimen and muscle relaxants   Protein-calorie malnutrition, moderate: - due to decrease PO intake and cancer. -dietician has been consulted - Continue TPN Patient has been started on feeds which is slowly being uptitrated per general surgery.   Hypokalemia -Repleted. Keep potassium greater than 4.   Code Status: Full Family Communication: Updated patient and no family at bedside. Disposition Plan: Home when ok with [facility].   Consultants:  Gen. surgery Dr.Gerkin 04/05/2014  Radiation oncology: Dr. Lisbeth Renshaw 04/06/2014  Procedures: -Placement open feeding jejunostomy tube by Dr. Scherrie Merritts 12/9 - NG tube inserted by radiology via fluoroscopic guidance 04/10/14 CT abdomen and pelvis 04/08/2014 CT chest 04/10/2014 Abdominal x-ray 04/08/2014, 04/12/2014 Water-soluble upper GI series 04/11/2014    Antibiotics:  IV vancomycin 04/10/2014>>>>04/16/14  IV Zosyn 04/10/2014>>>>>04/17/14  HPI/Subjective: Patient denies any emesis. Patient states as long as NG tube is in place no further nausea or emesis. Some improvement in abdominal pain. No bowel movement. Patient with  flatus.  Objective: Filed Vitals:   04/18/14 0537  BP:  125/87  Pulse: 87  Temp: 97.6 F (36.4 C)  Resp: 16    Intake/Output Summary (Last 24 hours) at 04/18/14 1322 Last data filed at 04/18/14 0900  Gross per 24 hour  Intake    120 ml  Output   2000 ml  Net  -1880 ml   Filed Weights   04/16/14 0522 04/17/14 0431 04/18/14 0537  Weight: 79.833 kg (176 lb) 79.334 kg (174 lb 14.4 oz) 78.79 kg (173 lb 11.2 oz)    Exam:   General:  NAD. NG tube in place.  Cardiovascular: RRR  Respiratory: CTAB  Abdomen: Soft, J-tube in place. Nondistended. Positive bowel sounds.  Musculoskeletal: No clubbing cyanosis or edema.  Data Reviewed: Basic Metabolic Panel:  Recent Labs Lab 04/14/14 0800 04/15/14 0544 04/16/14 0650 04/17/14 0545 04/18/14 0535 04/18/14 0658 04/18/14 0949  NA 133* 135* 139 140 134*  --  138  K 3.5* 3.7 4.8 4.4 6.0*  --  4.9  CL 97 98 101 103 100  --  101  CO2 25 25 26 24 22   --  24  GLUCOSE 137* 133* 137* 154* 635* 140* 127*  BUN 9 10 14 18 22   --  23  CREATININE 0.75 0.80 1.08 1.09 1.14  --  1.17  CALCIUM 8.8 9.1 9.8 9.6 9.9  --  10.5  MG 2.1 2.1 2.4  --  2.7*  --  2.6*  PHOS 2.9 3.8 4.2  --  6.6*  --  3.7   Liver Function Tests:  Recent Labs Lab 04/12/14 0343 04/13/14 0600 04/14/14 0800 04/18/14 0535 04/18/14 0949  AST  --  14 31 72* 87*  ALT  --  52 56* 150* 183*  ALKPHOS  --  65 76 120* 141*  BILITOT 1.1 0.9 1.4* 1.0 1.0  PROT  --  6.0 6.2 6.9 7.8  ALBUMIN  --  2.7* 2.6* 2.8* 3.3*   No results for input(s): LIPASE, AMYLASE in the last 168 hours. No results for input(s): AMMONIA in the last 168 hours. CBC:  Recent Labs Lab 04/12/14 0343 04/13/14 0600 04/14/14 0800 04/17/14 0545 04/18/14 0535  WBC 10.5 11.4* 9.5 14.0* 12.8*  NEUTROABS  --  9.4*  --   --  9.8*  HGB 13.3 12.6* 13.3 14.6 14.3  HCT 39.4 36.8* 38.6* 43.8 45.1  MCV 86.0 84.4 85.0 87.3 90.7  PLT 152 182 209 312 395   Cardiac Enzymes: No results for input(s):  CKTOTAL, CKMB, CKMBINDEX, TROPONINI in the last 168 hours. BNP (last 3 results) No results for input(s): PROBNP in the last 8760 hours. CBG:  Recent Labs Lab 04/17/14 0648 04/17/14 1135 04/17/14 1708 04/18/14 0042 04/18/14 0533  GLUCAP 132* 126* 141* 128* 144*    Recent Results (from the past 240 hour(s))  Culture, blood (routine x 2)     Status: None   Collection Time: 04/10/14  6:05 PM  Result Value Ref Range Status   Specimen Description BLOOD LEFT HAND  Final   Special Requests BOTTLES DRAWN AEROBIC AND ANAEROBIC 5CC  Final   Culture  Setup Time   Final    04/11/2014 03:11 Performed at [facility]    Culture   Final    NO GROWTH 5 DAYS Performed at [facility]    Report Status 04/17/2014 FINAL  Final  Culture, blood (routine x 2)     Status: None   Collection Time: 04/10/14  6:27 PM  Result Value Ref Range Status   Specimen Description  BLOOD LEFT ARM  Final   Special Requests BOTTLES DRAWN AEROBIC AND ANAEROBIC 10CC  Final   Culture  Setup Time   Final    04/11/2014 03:11 Performed at [facility]    Culture   Final    NO GROWTH 5 DAYS Note: Culture results may be compromised due to an excessive volume of blood received in culture bottles. Performed at [facility]    Report Status 04/17/2014 FINAL  Final  MRSA PCR Screening     Status: None   Collection Time: 04/10/14 10:02 PM  Result Value Ref Range Status   MRSA by PCR NEGATIVE NEGATIVE Final    Comment:        The GeneXpert MRSA Assay (FDA approved for NASAL specimens only), is one component of a comprehensive MRSA colonization surveillance program. It is not intended to diagnose MRSA infection nor to guide or monitor treatment for MRSA infections.   Culture, Urine     Status: None   Collection Time: 04/12/14  2:08 AM  Result Value Ref Range Status   Specimen Description URINE, CLEAN CATCH  Final   Special Requests none Normal  Final   Culture  Setup Time    Final    04/12/2014 11:21 Performed at [place] Performed at [facility]   Final   Culture NO GROWTH Performed at [facility]   Final   Report Status 04/13/2014 FINAL  Final     Studies: No results found.  Scheduled Meds: . antiseptic oral rinse  7 mL Mouth Rinse q12n4p  . chlorhexidine  15 mL Mouth Rinse BID  . enoxaparin (LOVENOX) injection  40 mg Subcutaneous Q24H  . [START ON 04/19/2014] feeding supplement (OSMOLITE 1.5 CAL)  1,000 mL Per Tube Q24H  . insulin aspart  0-9 Units Subcutaneous 4 times per day  . LORazepam  1 mg Intravenous Once  . pantoprazole (PROTONIX) IV  40 mg Intravenous Q12H  . sodium chloride  10-40 mL Intracatheter Q12H   Continuous Infusions: . Marland KitchenTPN (CLINIMIX-E) Adult 40 mL/hr at 04/17/14 1735  . sodium chloride    . TPN (CLINIMIX) Adult without lytes      Principal Problem:   Dysphagia, pharyngoesophageal phase Active Problems:   Esophagus cancer   Chronic back pain   Protein-calorie malnutrition, severe   Dysphagia   Malnutrition of moderate degree   Esophageal carcinoma   SBO (small bowel obstruction)   Ileus, postoperative   Nausea with vomiting    Time spent: 108 minutes    THOMPSON,DANIEL M.D. [facility] Pager [phone]. If 7PM-7AM, please contact night-coverage at [email or web address], password [facility] 04/18/2014, 1:22 PM  LOS: 13 days

## 2014-04-18 NOTE — Progress Notes (Signed)
NUTRITION FOLLOW UP  Intervention:   - Advance TF to goal rate of Osmolite 1.5 @ 55 mL/hr with Prostat once daily. TF regimen to provide 2080 kcal, 98 g protein and 1003 mL of free water. TF to be provided over 24 hours.  - Recommend D/C TPN. Pt currently receiving calories (126% of needs) and protein (127% of needs) above recommended goals. - RD will continue to monitor.   For D/C: -Recommend 24 hr infusion during admit with transition to 29-47 hour cyclic regimen once pt stable for d/c (pt unable to utilize J-tube for two weeks; will likely require TPN at home) -Recommend CM c/s for assistance with home nutrition support needs  Nutrition Dx:   Inadequate oral intake related to esophageal cancer as evidenced by need for nutrition support, ongoing.  Goal:   Pt to meet >/= 90% of their estimated nutrition needs, will meet with TPN   Monitor:   TPN regimen & tolerance, weight, labs, I/O's  Assessment:   58 y.o. Admitted with progressive dysphagia. He is now unable to tolerate any liquids. PMHX of Esophagus cancer, squamous cell carcinoma, Weight loss, and ulcerative colitis.  - J-tube placement 12/9.  - Probable ileus secondary to jejunum narrowing and kink at insertion site. Improvement of symptoms with NG tube placement 12/13. Pt with nausea when NG tube is clamped. Per MD note, will leave tube unclamped for now. Large output is clear green, no TF in current cannister.  - Pt will be receiving 2762 kcal (126% of estimated needs) and 146 g protein (127% of estimated needs) with both TF and TPN running.  - Spoke with pharmacist who reported that PA and Surgeon to discuss d/c of TPN.   12/21 Alternative TF + TPN regimen:  Osmolite 1.5 at 50 ml/hr = 75g protein and 1800 kcal  Reduced rate Clinimix 5/15 at 40 ml/hr = 48 g protein, 682 kcal  NO FAT EMULSION  Total: 123 g protein and 2480 kcal  Labs: Na and K WNL Mg elevated Phos WNL  Details for Home Feedings Feedings per  J-tube TF: Osmolite 1.5 @ goal rate of 110 ml/hr over 12 hours (tentatively 8pm-8am).  30 ml Prostat BID Free water: 60 ml before and after each continuous feeding. Patient will need an additional 875 ml per day (~8 oz QID).  Above to provide: 2180 kcal, 113g protein, and 2000 ml free water  Height: Ht Readings from Last 1 Encounters:  04/12/14 5\' 11"  (1.803 m)    Weight Status:   Wt Readings from Last 1 Encounters:  04/18/14 173 lb 11.2 oz (78.79 kg)    Re-estimated needs:  Kcal: 2000-2200 Protein: 105-115 g Fluid: 2 L/day  Skin: intact; surgical incision  Diet Order: Diet NPO time specified Except for: Ice Chips .TPN (CLINIMIX-E) Adult   Intake/Output Summary (Last 24 hours) at 04/18/14 1033 Last data filed at 04/18/14 0900  Gross per 24 hour  Intake    360 ml  Output   2200 ml  Net  -1840 ml    Last BM: 12/21   Labs:   Recent Labs Lab 04/16/14 0650 04/17/14 0545 04/18/14 0535 04/18/14 0658 04/18/14 0949  NA 139 140 134*  --  138  K 4.8 4.4 6.0*  --  4.9  CL 101 103 100  --  101  CO2 26 24 22   --  24  BUN 14 18 22   --  23  CREATININE 1.08 1.09 1.14  --  1.17  CALCIUM 9.8 9.6  9.9  --  10.5  MG 2.4  --  2.7*  --  2.6*  PHOS 4.2  --  6.6*  --  3.7  GLUCOSE 137* 154* 635* 140* 127*    CBG (last 3)   Recent Labs  04/17/14 1708 04/18/14 0042 04/18/14 0533  GLUCAP 141* 128* 144*    Scheduled Meds: . antiseptic oral rinse  7 mL Mouth Rinse q12n4p  . chlorhexidine  15 mL Mouth Rinse BID  . enoxaparin (LOVENOX) injection  40 mg Subcutaneous Q24H  . feeding supplement (OSMOLITE 1.5 CAL)  1,000 mL Per Tube Q24H  . insulin aspart  0-9 Units Subcutaneous 4 times per day  . LORazepam  1 mg Intravenous Once  . pantoprazole (PROTONIX) IV  40 mg Intravenous Q12H  . sodium chloride  10-40 mL Intracatheter Q12H    Continuous Infusions: . Marland KitchenTPN (CLINIMIX-E) Adult 40 mL/hr at 04/17/14 1735  . sodium chloride      Laurette Schimke MS, RD, LDN

## 2014-04-18 NOTE — Progress Notes (Signed)
Lab called to notify of glucose of 635, AM CBG collected the same time reads 144. Glucose results believed to be in error, re-draw collected and sent to lab. Results pending. Will pass on to day shift for follow up. Pt without s/s of elevated glucose, no complaints. Will continue to monitor.

## 2014-04-19 LAB — CBC
HEMATOCRIT: 45 % (ref 39.0–52.0)
HEMOGLOBIN: 14.9 g/dL (ref 13.0–17.0)
MCH: 29.6 pg (ref 26.0–34.0)
MCHC: 33.1 g/dL (ref 30.0–36.0)
MCV: 89.5 fL (ref 78.0–100.0)
Platelets: 369 10*3/uL (ref 150–400)
RBC: 5.03 MIL/uL (ref 4.22–5.81)
RDW: 13.2 % (ref 11.5–15.5)
WBC: 14.7 10*3/uL — ABNORMAL HIGH (ref 4.0–10.5)

## 2014-04-19 LAB — COMPREHENSIVE METABOLIC PANEL
ALBUMIN: 3.4 g/dL — AB (ref 3.5–5.2)
ALT: 171 U/L — AB (ref 0–53)
AST: 70 U/L — AB (ref 0–37)
Alkaline Phosphatase: 129 U/L — ABNORMAL HIGH (ref 39–117)
Anion gap: 8 (ref 5–15)
BUN: 29 mg/dL — ABNORMAL HIGH (ref 6–23)
CO2: 25 mmol/L (ref 19–32)
CREATININE: 1.04 mg/dL (ref 0.50–1.35)
Calcium: 9.8 mg/dL (ref 8.4–10.5)
Chloride: 108 mEq/L (ref 96–112)
GFR calc Af Amer: 88 mL/min — ABNORMAL LOW (ref >90)
GFR, EST NON AFRICAN AMERICAN: 76 mL/min — AB (ref >90)
Glucose, Bld: 149 mg/dL — ABNORMAL HIGH (ref 70–99)
Potassium: 4.3 mmol/L (ref 3.5–5.1)
SODIUM: 141 mmol/L (ref 135–145)
Total Bilirubin: 0.9 mg/dL (ref 0.3–1.2)
Total Protein: 7.4 g/dL (ref 6.0–8.3)

## 2014-04-19 LAB — GLUCOSE, CAPILLARY
GLUCOSE-CAPILLARY: 154 mg/dL — AB (ref 70–99)
GLUCOSE-CAPILLARY: 109 mg/dL — AB (ref 70–99)
GLUCOSE-CAPILLARY: 119 mg/dL — AB (ref 70–99)
Glucose-Capillary: 147 mg/dL — ABNORMAL HIGH (ref 70–99)

## 2014-04-19 LAB — PHOSPHORUS: PHOSPHORUS: 3.6 mg/dL (ref 2.3–4.6)

## 2014-04-19 LAB — MAGNESIUM: MAGNESIUM: 2.3 mg/dL (ref 1.5–2.5)

## 2014-04-19 MED ORDER — TRACE MINERALS CR-CU-F-FE-I-MN-MO-SE-ZN IV SOLN
INTRAVENOUS | Status: AC
  Administered 2014-04-19: 17:00:00 via INTRAVENOUS
  Filled 2014-04-19: qty 1000

## 2014-04-19 NOTE — Progress Notes (Signed)
PARENTERAL NUTRITION CONSULT NOTE  Pharmacy Consult for TPN Indication: Intolerance to enteral feeding  No Known Allergies  Patient Measurements: Height: 5' 11"  (180.3 cm) Weight: 173 lb 11.2 oz (78.79 kg) IBW/kg (Calculated) : 75.3 Adjusted Body Weight: 76.5 kg  Vital Signs: Temp: 97.4 F (36.3 C) (12/22 0511) Temp Source: Axillary (12/22 0511) BP: 146/89 mmHg (12/22 0511) Pulse Rate: 100 (12/22 0511) Intake/Output from previous day: 12/21 0701 - 12/22 0700 In: -  Out: 1475 [Urine:750; Emesis/NG output:725] Intake/Output from this shift:    Labs:  Recent Labs  04/17/14 0545 04/18/14 0535 04/19/14 0500  WBC 14.0* 12.8* 14.7*  HGB 14.6 14.3 14.9  HCT 43.8 45.1 45.0  PLT 312 395 369     Recent Labs  04/18/14 0535 04/18/14 0658 04/18/14 0949 04/19/14 0500  NA 134*  --  138 141  K 6.0*  --  4.9 4.3  CL 100  --  101 108  CO2 22  --  24 25  GLUCOSE 635* 140* 127* 149*  BUN 22  --  23 29*  CREATININE 1.14  --  1.17 1.04  CALCIUM 9.9  --  10.5 9.8  MG 2.7*  --  2.6* 2.3  PHOS 6.6*  --  3.7 3.6  PROT 6.9  --  7.8 7.4  ALBUMIN 2.8*  --  3.3* 3.4*  AST 72*  --  87* 70*  ALT 150*  --  183* 171*  ALKPHOS 120*  --  141* 129*  BILITOT 1.0  --  1.0 0.9  PREALBUMIN 16.8*  --   --   --   TRIG 67  --   --   --   Corrected Ca = 10.9 Estimated Creatinine Clearance: 80.4 mL/min (by C-G formula based on Cr of 1.04).    Recent Labs  04/18/14 1756 04/18/14 2342 04/19/14 0701  GLUCAP 168* 155* 109*    Medical History: Past Medical History  Diagnosis Date  . Esophageal cancer 03/29/14   Insulin Requirements:   6 units sensitive SSI Novolog used in past 24h  Current Nutrition:    NPO  Clinimix 5/15 @ 40 ml/hr  Osmolite 1.5 via J-tube at 55 ml/hr (at goal rate)  IVF:  NS at 40 mL/hr  Central access: PICC placed 04/12/14 TPN start date: 04/12/14  ASSESSMENT                                                                                                           HPI:  58 y/o M with PMH of alcohol abuse (sober for last 9 years), chronic back and neck pain, and esophageal cancer with complete esophagus obstruction causing dysphagia. Admitted from [facility] on 12/8 due to inability to eat or drink and dehydration. He is s/p open feeding jejunostomy tube on 12/9 and developed significant n/v after TF started. NGT inserted in IR 12/13 with immediate output. Pt developed fever, chills, tachycardia and hypoxia. CT of chest showed bilateral aspiration pneumonia, for which he is on broad spectrum antibiotics, Vancomycin and Zosyn. J tube became clogged, so patient  ordered to be transitioned from tube feeds to TPN for nutrition, with pharmacy to assist with dosing.    SIGNIFICANT EVENTS                                                                                              12/16: Surgery MD states waiting for return of bowel function before restarting tube feeds 12/18: Surgery starting trickle TFs via J-tube, increase based on toleration. NGT still with copious output 12/19: Surgery increased Osmolite 1.5 to 30 mL/hr via J-tube. TPN reduced to 50 mL/hr.  12/20: Surgery increased Osmolite 1.5 to 50 mL/hr via J-tube, TPN rate reduced and lipids d/c. 12/21: Surgery increased Osmolite 1.5 to 55 mL/hr via J-tube (goal rate), requested for TPN to continue despite patient receiving >120% of their caloric and protein goals 12/22: NGT clamping trials, patient tolerating TFs at goal rate, Surgery requesting TPN to continue for now  Today, 04/19/2014:  Glucose - CBGs 2/3 values above goal of 150  Electrolytes - improved to WNL  Renal - SCr WNL, trending back down  LFTs - New elevations of LFTs and Alk Phos (possibly due to TPN)  TGs - remain WNL   Prealbumin - 16.8 (12/21), 6.5 (12/16)  Intake not documented, NG output documented at 725 ml (12/21), decreased  NUTRITIONAL GOALS                                                                                              RD recs:  Kcal: 2000-2200 Protein: 105-115g Fluid: 2L/day  Clinimix E 5/15 at a goal rate of 90 ml/hr + 20% fat emulsion at 10 ml/hr to provide: 108 g/day protein, 2013 Kcal/day.  12/22: TF + TPN:  Osmolite 1.5 at 55 ml/hr = 82.8g protein and 1980 kcal  Reduced rate Clinimix 5/15 at 20 ml/hr = 24 g protein, 341 kcal  NO FAT EMULSION  Total: 106.8g protein and 2321 kcal   PLAN                                                                                                                     1. Decrease Clinimix 5/15 to 20 ml/hr and continue to use formulation WITHOUT electrolytes as patient receiving electrolytes in TFs  Rate  reduced to avoid overfeeding  TPN to contain standard multivitamins and trace elements. 2. No fat emulsion 3. Continue sensitive Scale SSI and CBG checks q6h.  4. TPN lab panels on Mondays & Thursdays. 5. Cmet, Mag, Phos in AM 5. Continue mIVF NS at 40 ml/hr 6. Follow up enteral feedings toleration  Surgery to specify when OK to discontinue TPN  Thank you for the consult.  Currie Paris, PharmD, BCPS Pager: [phone] Pharmacy: [phone] 04/19/2014 9:58 AM

## 2014-04-19 NOTE — Progress Notes (Signed)
[facility] Progress Note  13 Days Post-Op  Subjective: Pt is frustrated, but pleasant this morning.  He just talked to Dr. Benay Spice this am.  No N/V, NG output only 725 in last 25 hours.  Pt denies abdominal pain or distension.  Ambulating OOB some.  Says he's having some flatus, but not a lot.  Tolerating TF at goal.  NG in place with ice chips only.   Objective: Vital signs in last 24 hours: Temp:  [97.4 F (36.3 C)-97.6 F (36.4 C)] 97.4 F (36.3 C) (12/22 0511) Pulse Rate:  [84-101] 100 (12/22 0511) Resp:  [16] 16 (12/22 0511) BP: (127-146)/(89-92) 146/89 mmHg (12/22 0511) SpO2:  [99 %] 99 % (12/22 0511) Last BM Date: 04/07/14  Intake/Output from previous day: 04-26-23 0701 - 12/22 0700 In: -  Out: 1475 [Urine:750; Emesis/NG output:725] Intake/Output this shift:    PE: Gen:  Alert, NAD, pleasant Card:  RRR, no M/G/R heard Pulm:  CTA, no W/R/R Abd: Soft, NT/ND, +BS, no HSM   Lab Results:   Recent Labs  04/25/14 0535 04/19/14 0500  WBC 12.8* 14.7*  HGB 14.3 14.9  HCT 45.1 45.0  PLT 395 369   BMET  Recent Labs  2014/04/25 0949 04/19/14 0500  NA 138 141  K 4.9 4.3  CL 101 108  CO2 24 25  GLUCOSE 127* 149*  BUN 23 29*  CREATININE 1.17 1.04  CALCIUM 10.5 9.8   PT/INR No results for input(s): LABPROT, INR in the last 72 hours. CMP     Component Value Date/Time   NA 141 04/19/2014 0500   K 4.3 04/19/2014 0500   CL 108 04/19/2014 0500   CO2 25 04/19/2014 0500   GLUCOSE 149* 04/19/2014 0500   BUN 29* 04/19/2014 0500   CREATININE 1.04 04/19/2014 0500   CALCIUM 9.8 04/19/2014 0500   PROT 7.4 04/19/2014 0500   ALBUMIN 3.4* 04/19/2014 0500   AST 70* 04/19/2014 0500   ALT 171* 04/19/2014 0500   ALKPHOS 129* 04/19/2014 0500   BILITOT 0.9 04/19/2014 0500   GFRNONAA 76* 04/19/2014 0500   GFRAA 88* 04/19/2014 0500   Lipase  No results found for: LIPASE     Studies/Results: Dg Abd 2 Views  04-25-2014   CLINICAL DATA:  Nausea,  vomiting  EXAM: ABDOMEN - 2 VIEW  COMPARISON:  04/12/2014  FINDINGS: Nasogastric tube coiled in the stomach. J-tube in satisfactory unchanged position. Oral contrast material is seen within the distal colon. There is no bowel dilatation to suggest obstruction. There is no evidence of pneumoperitoneum, portal venous gas or pneumatosis. There are no pathologic calcifications along the expected course of the ureters.The osseous structures are unremarkable.  IMPRESSION: Nasogastric tube and J-tube in satisfactory position.   Electronically Signed   By: Kathreen Devoid   On: 04/25/2014 13:38    Anti-infectives: Anti-infectives    Start     Dose/Rate Route Frequency Ordered Stop   04/16/14 2000  vancomycin (VANCOCIN) IVPB 1000 mg/200 mL premix  Status:  Discontinued     1,000 mg200 mL/hr over 60 Minutes Intravenous Every 12 hours 04/16/14 1354 04/16/14 1727   04/12/14 2200  vancomycin (VANCOCIN) 1,250 mg in sodium chloride 0.9 % 250 mL IVPB  Status:  Discontinued     1,250 mg166.7 mL/hr over 90 Minutes Intravenous Every 8 hours 04/12/14 1455 04/16/14 1336   04/11/14 0600  piperacillin-tazobactam (ZOSYN) IVPB 3.375 g     3.375 g12.5 mL/hr over 240 Minutes Intravenous Every 8 hours 04/10/14 2111  04/18/14 0142   04/11/14 0600  vancomycin (VANCOCIN) IVPB 1000 mg/200 mL premix  Status:  Discontinued     1,000 mg200 mL/hr over 60 Minutes Intravenous Every 8 hours 04/10/14 2111 04/12/14 1454   04/11/14 0400  piperacillin-tazobactam (ZOSYN) IVPB 3.375 g  Status:  Discontinued     3.375 g12.5 mL/hr over 240 Minutes Intravenous Every 8 hours 04/10/14 1910 04/10/14 2110   04/11/14 0400  vancomycin (VANCOCIN) IVPB 1000 mg/200 mL premix  Status:  Discontinued     1,000 mg200 mL/hr over 60 Minutes Intravenous Every 8 hours 04/10/14 1910 04/10/14 2110   04/10/14 2130  piperacillin-tazobactam (ZOSYN) IVPB 3.375 g     3.375 g100 mL/hr over 30 Minutes Intravenous  Once 04/10/14 2111 04/10/14 2155   04/10/14 2130   vancomycin (VANCOCIN) IVPB 1000 mg/200 mL premix     1,000 mg200 mL/hr over 60 Minutes Intravenous  Once 04/10/14 2111 04/10/14 2225   04/10/14 1800  vancomycin (VANCOCIN) IVPB 1000 mg/200 mL premix  Status:  Discontinued     1,000 mg200 mL/hr over 60 Minutes Intravenous  Once 04/10/14 1741 04/10/14 2110   04/10/14 1800  piperacillin-tazobactam (ZOSYN) IVPB 3.375 g  Status:  Discontinued     3.375 g12.5 mL/hr over 240 Minutes Intravenous  Once 04/10/14 1741 04/10/14 2110   04/06/14 1245  cefOXitin (MEFOXIN) 2 g in dextrose 5 % 50 mL IVPB     2 g100 mL/hr over 30 Minutes Intravenous On call to O.R. 04/06/14 1231 04/06/14 1305       Assessment/Plan 1. Esophageal cancer with complete obstruction Placement open feeding jejunostomy tube, 04/06/2014, Armandina Gemma, MD  IR opened tube with wire12/15/15 IR NG tube placement 04/10/14 4 liters reportedly drained 2. Post op nausea, vomiting and J tube obstruction 3. Post op pneumonia, most likely from aspiration 4. Chronic back and neck pain 5.  PCM on TF and TPN 6. Aspiration pneumonia 7.  Leukocytosis - 14.7 today   Plan:  1.  Tube feeding to goal which is 55 ml/24hr, also on TPN 2.  Clamp NG tube and start clamping trials, if he does not tolerate this would repeat an UGI study tomorrow 3.  Remove staples on POD #14 (tomorrow 04/20/14) 4.  Resuming cancer therapy per Dr. Ammie Dalton ASAP discharged from the hospital 5.  Mobilize OOB and IS 6.  SCD's and lovenox 7.  Finished course of Zosyn (7 days) for aspiration pneumonia  8.  CBC in am    LOS: 14 days    DORT, Jinny Blossom 04/19/2014, 7:49 AM Pager: [phone]

## 2014-04-19 NOTE — Progress Notes (Signed)
[facility] PROGRESS NOTE  Billy Romero DXI:[id number] DOB: 1955/09/01 DOA: 04/05/2014 PCP: Lujean Amel, MD  Admission history of present illness/brief narrative:   HPI: Billy Romero is a 58 y.o. 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 Admitted from [facility] due to inability to eat or drink and dehydration. Patient reports that symptoms has been steadily progressing and worsening over the last week or two and currently having trouble with keeping down even his own saliva. Plan is for patient to be admitted for IVF's resuscitation and also placement of J tube for tube feedings and nutrition. Patient had Placement open feeding jejunostomy tube by Dr. Scherrie Merritts 12/9. He had significant nausea and vomiting after his tube feeds were started, CT abdomen with contrast showing evidence of narrowing at the insertion type, as well having a kink at the insertion site . Given patient continuous nausea and vomiting, he had nasogastric tube inserted via fluoroscopic guidance by radiology, patient had 3600 mL output within half an hour from tube insertion, patient developed fever, hypoxia, tachycardia, CT chest showing evidence of bilateral aspiration pneumonia. Patient is improving on IV vancomycin and Zosyn, unfortunately his J-tube remains clogged, so he is being started on TPN. IV antibiotics have been discontinued.      Assessment/Plan: Dysphagia, pharyngoesophageal phase:  -secondary to complete obstruction of esophagus due to cancer. -continue IVF's resuscitation -Placement open feeding jejunostomy tube by Dr. Armandina Gemma 12/9 -electrolytes repletion as needed  Nausea and vomiting / Small bowel obstruction at J-tube insertion site./Probable ileus -Secondary to Jejunum narrowing, and  kink at the insertion site, patient is known to have history of congenital partial malrotation. - NG tube via fluoroscopic guidance inserted by radiology 12/13 , with significant improvement of symptoms . -UGI shows tube is not obstructing the lumen and thus possible ileus (patient has hypokalemia). Keep potassium greater than 4. Keep magnesium greater than 2. General surgery is following and appreciate input and recommendations.  Aspiration pneumonia  -Most likely related to copious amount of nausea and vomiting secondary to obstruction . -s/p IV  Zosyn (12/13>>>>04/17/14 ) -No further hypoxia .  Esophagus cancer:  -Following with oncology as an outpatient -plan is for radiation , radiation oncology follow-up appreciated. -CT surgery follow-up is appreciated.  Chronic back pain: - Continue current pain regimen and muscle relaxants   Protein-calorie malnutrition, moderate: - due to decrease PO intake and cancer. -dietician has been consulted - Continue TPN Patient has been started on feeds which is slowly being uptitrated per general surgery.   Hypokalemia -Repleted. Keep potassium greater than 4.   Code Status: Full Family Communication: Updated patient and no family at bedside. Disposition Plan: Home when ok with [facility].   Consultants:  Gen. surgery Dr.Gerkin 04/05/2014  Radiation oncology: Dr. Lisbeth Renshaw 04/06/2014  Procedures: -Placement open feeding jejunostomy tube by Dr. Scherrie Merritts 12/9 - NG tube inserted by radiology via fluoroscopic guidance 04/10/14 CT abdomen and pelvis 04/08/2014 CT chest 04/10/2014 Abdominal x-ray 04/08/2014, 04/12/2014 Water-soluble upper GI series 04/11/2014    Antibiotics:  IV vancomycin 04/10/2014>>>>04/16/14  IV Zosyn 04/10/2014>>>>>04/17/14  HPI/Subjective: Patient denies any emesis. Patient states as long as NG tube is in place no further nausea or emesis. Some improvement in abdominal pain. No bowel movement. Patient with  flatus. Patient ambulating.  Objective: Filed Vitals:   04/19/14 7673  BP: 146/89  Pulse: 100  Temp: 97.4 F (36.3 C)  Resp: 16    Intake/Output Summary (Last 24 hours) at 04/19/14 1122 Last data filed at 04/19/14 0429  Gross per 24 hour  Intake      0 ml  Output   1275 ml  Net  -1275 ml   Filed Weights   04/16/14 0522 04/17/14 0431 04/18/14 0537  Weight: 79.833 kg (176 lb) 79.334 kg (174 lb 14.4 oz) 78.79 kg (173 lb 11.2 oz)    Exam:   General:  NAD. NG tube in place.  Cardiovascular: RRR  Respiratory: CTAB  Abdomen: Soft, J-tube in place. Nondistended. Positive bowel sounds.  Musculoskeletal: No clubbing cyanosis or edema.  Data Reviewed: Basic Metabolic Panel:  Recent Labs Lab 04/15/14 0544 04/16/14 0650 04/17/14 0545 04/18/14 0535 04/18/14 0658 04/18/14 0949 04/19/14 0500  NA 135* 139 140 134*  --  138 141  K 3.7 4.8 4.4 6.0*  --  4.9 4.3  CL 98 101 103 100  --  101 108  CO2 25 26 24 22   --  24 25  GLUCOSE 133* 137* 154* 635* 140* 127* 149*  BUN 10 14 18 22   --  23 29*  CREATININE 0.80 1.08 1.09 1.14  --  1.17 1.04  CALCIUM 9.1 9.8 9.6 9.9  --  10.5 9.8  MG 2.1 2.4  --  2.7*  --  2.6* 2.3  PHOS 3.8 4.2  --  6.6*  --  3.7 3.6   Liver Function Tests:  Recent Labs Lab 04/13/14 0600 04/14/14 0800 04/18/14 0535 04/18/14 0949 04/19/14 0500  AST 14 31 72* 87* 70*  ALT 52 56* 150* 183* 171*  ALKPHOS 65 76 120* 141* 129*  BILITOT 0.9 1.4* 1.0 1.0 0.9  PROT 6.0 6.2 6.9 7.8 7.4  ALBUMIN 2.7* 2.6* 2.8* 3.3* 3.4*   No results for input(s): LIPASE, AMYLASE in the last 168 hours. No results for input(s): AMMONIA in the last 168 hours. CBC:  Recent Labs Lab 04/13/14 0600 04/14/14 0800 04/17/14 0545 04/18/14 0535 04/19/14 0500  WBC 11.4* 9.5 14.0* 12.8* 14.7*  NEUTROABS 9.4*  --   --  9.8*  --   HGB 12.6* 13.3 14.6 14.3 14.9  HCT 36.8* 38.6* 43.8 45.1 45.0  MCV 84.4 85.0 87.3 90.7 89.5  PLT 182 209 312 395 369   Cardiac Enzymes: No  results for input(s): CKTOTAL, CKMB, CKMBINDEX, TROPONINI in the last 168 hours. BNP (last 3 results) No results for input(s): PROBNP in the last 8760 hours. CBG:  Recent Labs Lab 04/18/14 0042 04/18/14 0533 04/18/14 1756 04/18/14 2342 04/19/14 0701  GLUCAP 128* 144* 168* 155* 109*    Recent Results (from the past 240 hour(s))  Culture, blood (routine x 2)     Status: None   Collection Time: 04/10/14  6:05 PM  Result Value Ref Range Status   Specimen Description BLOOD LEFT HAND  Final   Special Requests BOTTLES DRAWN AEROBIC AND ANAEROBIC 5CC  Final   Culture  Setup Time   Final    04/11/2014 03:11 Performed at [facility]    Culture   Final    NO GROWTH 5 DAYS Performed at [facility]    Report Status 04/17/2014 FINAL  Final  Culture, blood (routine x 2)     Status: None   Collection Time: 04/10/14  6:27 PM  Result Value Ref Range Status   Specimen Description BLOOD LEFT ARM  Final  Special Requests BOTTLES DRAWN AEROBIC AND ANAEROBIC 10CC  Final   Culture  Setup Time   Final    04/11/2014 03:11 Performed at [facility]    Culture   Final    NO GROWTH 5 DAYS Note: Culture results may be compromised due to an excessive volume of blood received in culture bottles. Performed at [facility]    Report Status 04/17/2014 FINAL  Final  MRSA PCR Screening     Status: None   Collection Time: 04/10/14 10:02 PM  Result Value Ref Range Status   MRSA by PCR NEGATIVE NEGATIVE Final    Comment:        The GeneXpert MRSA Assay (FDA approved for NASAL specimens only), is one component of a comprehensive MRSA colonization surveillance program. It is not intended to diagnose MRSA infection nor to guide or monitor treatment for MRSA infections.   Culture, Urine     Status: None   Collection Time: 04/12/14  2:08 AM  Result Value Ref Range Status   Specimen Description URINE, CLEAN CATCH  Final   Special Requests none Normal  Final    Culture  Setup Time   Final    04/12/2014 11:21 Performed at [place] Performed at [facility]   Final   Culture NO GROWTH Performed at [facility]   Final   Report Status 04/13/2014 FINAL  Final     Studies: Dg Abd 2 Views  04/18/2014   CLINICAL DATA:  Nausea, vomiting  EXAM: ABDOMEN - 2 VIEW  COMPARISON:  04/12/2014  FINDINGS: Nasogastric tube coiled in the stomach. J-tube in satisfactory unchanged position. Oral contrast material is seen within the distal colon. There is no bowel dilatation to suggest obstruction. There is no evidence of pneumoperitoneum, portal venous gas or pneumatosis. There are no pathologic calcifications along the expected course of the ureters.The osseous structures are unremarkable.  IMPRESSION: Nasogastric tube and J-tube in satisfactory position.   Electronically Signed   By: Kathreen Devoid   On: 04/18/2014 13:38    Scheduled Meds: . antiseptic oral rinse  7 mL Mouth Rinse q12n4p  . chlorhexidine  15 mL Mouth Rinse BID  . enoxaparin (LOVENOX) injection  40 mg Subcutaneous Q24H  . feeding supplement (OSMOLITE 1.5 CAL)  1,000 mL Per Tube Q24H  . insulin aspart  0-9 Units Subcutaneous 4 times per day  . LORazepam  1 mg Intravenous Once  . pantoprazole (PROTONIX) IV  40 mg Intravenous Q12H  . sodium chloride  10-40 mL Intracatheter Q12H   Continuous Infusions: . sodium chloride 40 mL/hr at 04/18/14 1130  . TPN (CLINIMIX) Adult without lytes 40 mL/hr at 04/18/14 1701    Principal Problem:   Dysphagia, pharyngoesophageal phase Active Problems:   Esophagus cancer   Chronic back pain   Protein-calorie malnutrition, severe   Dysphagia   Malnutrition of moderate degree   Esophageal carcinoma   SBO (small bowel obstruction)   Ileus, postoperative   Nausea with vomiting    Time spent: 36 minutes    THOMPSON,DANIEL M.D. [facility] Pager [phone]. If 7PM-7AM, please contact  night-coverage at [email or web address], password [facility] 04/19/2014, 11:22 AM  LOS: 14 days

## 2014-04-19 NOTE — Progress Notes (Signed)
Per patient request Osmolyte feeding supplement via J tube will resume in morning.

## 2014-04-20 ENCOUNTER — Inpatient Hospital Stay (HOSPITAL_COMMUNITY): Payer: BC Managed Care – PPO

## 2014-04-20 ENCOUNTER — Other Ambulatory Visit: Payer: BC Managed Care – PPO

## 2014-04-20 ENCOUNTER — Ambulatory Visit: Payer: 59 | Admitting: Radiation Oncology

## 2014-04-20 ENCOUNTER — Encounter: Payer: BC Managed Care – PPO | Admitting: Nutrition

## 2014-04-20 ENCOUNTER — Ambulatory Visit: Payer: BC Managed Care – PPO | Admitting: Radiation Oncology

## 2014-04-20 ENCOUNTER — Ambulatory Visit: Payer: BC Managed Care – PPO | Admitting: Oncology

## 2014-04-20 ENCOUNTER — Ambulatory Visit: Payer: BC Managed Care – PPO

## 2014-04-20 LAB — PHOSPHORUS: Phosphorus: 4.4 mg/dL (ref 2.3–4.6)

## 2014-04-20 LAB — COMPREHENSIVE METABOLIC PANEL
ALBUMIN: 3.4 g/dL — AB (ref 3.5–5.2)
ALK PHOS: 147 U/L — AB (ref 39–117)
ALT: 188 U/L — ABNORMAL HIGH (ref 0–53)
ANION GAP: 6 (ref 5–15)
AST: 75 U/L — ABNORMAL HIGH (ref 0–37)
BILIRUBIN TOTAL: 1 mg/dL (ref 0.3–1.2)
BUN: 35 mg/dL — ABNORMAL HIGH (ref 6–23)
CO2: 26 mmol/L (ref 19–32)
Calcium: 9.8 mg/dL (ref 8.4–10.5)
Chloride: 106 mEq/L (ref 96–112)
Creatinine, Ser: 1.15 mg/dL (ref 0.50–1.35)
GFR calc Af Amer: 78 mL/min — ABNORMAL LOW (ref >90)
GFR, EST NON AFRICAN AMERICAN: 67 mL/min — AB (ref >90)
Glucose, Bld: 110 mg/dL — ABNORMAL HIGH (ref 70–99)
POTASSIUM: 4.5 mmol/L (ref 3.5–5.1)
Sodium: 138 mmol/L (ref 135–145)
Total Protein: 7.6 g/dL (ref 6.0–8.3)

## 2014-04-20 LAB — GLUCOSE, CAPILLARY
Glucose-Capillary: 112 mg/dL — ABNORMAL HIGH (ref 70–99)
Glucose-Capillary: 118 mg/dL — ABNORMAL HIGH (ref 70–99)
Glucose-Capillary: 125 mg/dL — ABNORMAL HIGH (ref 70–99)
Glucose-Capillary: 135 mg/dL — ABNORMAL HIGH (ref 70–99)

## 2014-04-20 LAB — CBC
HEMATOCRIT: 46.2 % (ref 39.0–52.0)
Hemoglobin: 14.8 g/dL (ref 13.0–17.0)
MCH: 28.6 pg (ref 26.0–34.0)
MCHC: 32 g/dL (ref 30.0–36.0)
MCV: 89.4 fL (ref 78.0–100.0)
Platelets: 419 10*3/uL — ABNORMAL HIGH (ref 150–400)
RBC: 5.17 MIL/uL (ref 4.22–5.81)
RDW: 13.1 % (ref 11.5–15.5)
WBC: 17.3 10*3/uL — ABNORMAL HIGH (ref 4.0–10.5)

## 2014-04-20 LAB — MAGNESIUM: MAGNESIUM: 2.3 mg/dL (ref 1.5–2.5)

## 2014-04-20 MED ORDER — IOHEXOL 300 MG/ML  SOLN
150.0000 mL | Freq: Once | INTRAMUSCULAR | Status: AC | PRN
  Administered 2014-04-20: 20 mL via ORAL

## 2014-04-20 MED ORDER — CLINIMIX E/DEXTROSE (5/15) 5 % IV SOLN
INTRAVENOUS | Status: DC
  Administered 2014-04-20: 18:00:00 via INTRAVENOUS
  Filled 2014-04-20: qty 1000

## 2014-04-20 NOTE — Care Management Note (Signed)
CARE MANAGEMENT NOTE 04/20/2014  Patient:  Billy Romero, Billy Romero   Account Number:  [id number]  Date Initiated:  04/06/2014  Documentation initiated by:  Marney Doctor  Subjective/Objective Assessment:   58 yo admitted with Dysphagia. PMH esophageal cancer with complete esophagus obstruction causing dysphagia.     Action/Plan:   From home with spouse   Anticipated DC Date:  04/22/2014   Anticipated DC Plan:  [place]  CM consult      [place]   Choice offered to / List presented to:  C-1 Patient   DME arranged  TUBE FEEDING  TUBE FEEDING PUMP      DME agency  [place] arranged  HH-1 RN      [place].   Status of service:  In process, will continue to follow Medicare Important Message given?   (If response is "NO", the following Medicare IM given date fields will be blank) Date Medicare IM given:   Medicare IM given by:   Date Additional Medicare IM given:   Additional Medicare IM given by:    Discharge Disposition:  [place]  Per UR Regulation:  Reviewed for med. necessity/level of care/duration of stay  If discussed at [place] of Stay Meetings, dates discussed:    Comments:  04/20/14 Marney Doctor RN,BSN,NCM [phone] Confirmed with [facility] that they will follow pt at DC.  Per MD note pt to DC in next 24-48 hrs.  TF order will need to be placed in [facility] order with specific nutrition recommendations.  04/08/14 MMcGibboney, RN, BSN [facility] will follow pt at discharge. Tube Feed orders may change in am, however orders are in under [facility] notes.   04/07/14 Marney Doctor RN,BSN,NCM Pt to DC home tomorrow with tube feeds.  HHRN ordered as well as Tube feeding pump.  Awaiting Dietitian recommendations for Tube Feeding.  Pt and wife offered choice and chose [facility] for [facility] services.  [facility] HH and DME reps alerted of referrals.  Spoke  with staff RN who has already begun teaching with the pt and wife on the tube feedings. CM will continue to follow and assist as needed.  04/06/14 Marney Doctor RN,BSN,NCM [phone] Chart reviewed and CM following for DC needs.

## 2014-04-20 NOTE — Progress Notes (Addendum)
General surgery:  Upper GI shows high-grade obstruction in the proximal esophagus from known cancer. Contrast instilled through the NG tube passes promptly through the stomach, nondilated duodenum, nondilated jejunum, and passes distal to the tip of the feeding jejunostomy without any obstruction or kinking.  We will advise the patient that we should remove the NG tube since it is not providing any objective benefit, and that he will be more comfortable., We will advise that we should resume tube feedings, and that this will facilitate proceeding immediately with chemotherapy and radiation therapy. His case was also discussed in GI tumor board this morning. I have discussed his care plan with Dr. Julieanne Manson.   Billy Romero. Dalbert Batman, M.D., [facility] General and Minimally invasive Surgery Breast and Colorectal Surgery Office:   [phone] Pager:   [phone]   I discussed findings with the patient, he understands and was agreeable to removing his NG.  I have removed it.  His tube feeding is already back to 55 ml per hour.  If he does well we can stop TNA tomorrow and aim for discharge next 24-48 hours.

## 2014-04-20 NOTE — Progress Notes (Signed)
PARENTERAL NUTRITION CONSULT NOTE  Pharmacy Consult for TPN Indication: Intolerance to enteral feeding  No Known Allergies  Patient Measurements: Height: 5' 11" (180.3 cm) Weight: 174 lb 3.2 oz (79.017 kg) IBW/kg (Calculated) : 75.3 Adjusted Body Weight: 76.5 kg  Vital Signs: Temp: 97.6 F (36.4 C) (12/23 1330) Temp Source: Oral (12/23 1330) BP: 134/93 mmHg (12/23 1330) Pulse Rate: 92 (12/23 1330) Intake/Output from previous day: 12/22 0701 - 12/23 0700 In: 910 [P.O.:360; NG/GT:550] Out: 1325 [Urine:625; Emesis/NG output:700] Intake/Output from this shift: Total I/O In: 240 [P.O.:240] Out: 125 [Urine:125]  Labs:  Recent Labs  04/18/14 0535 04/19/14 0500 04/20/14 0500  WBC 12.8* 14.7* 17.3*  HGB 14.3 14.9 14.8  HCT 45.1 45.0 46.2  PLT 395 369 419*     Recent Labs  04/18/14 0535  04/18/14 0949 04/19/14 0500 04/20/14 0500  NA 134*  --  138 141 138  K 6.0*  --  4.9 4.3 4.5  CL 100  --  101 108 106  CO2 22  --  _0 GLUCOSE 635*  < > 127* 149* 110*  BUN 22  --  23 29* 35*  CREATININE 1.14  --  1.17 1.04 1.15  CALCIUM 9.9  --  10.5 9.8 9.8  MG 2.7*  --  2.6* 2.3 2.3  PHOS 6.6*  --  3.7 3.6 4.4  PROT 6.9  --  7.8 7.4 7.6  ALBUMIN 2.8*  --  3.3* 3.4* 3.4*  AST 72*  --  87* 70* 75*  ALT 150*  --  183* 171* 188*  ALKPHOS 120*  --  141* 129* 147*  BILITOT 1.0  --  1.0 0.9 1.0  PREALBUMIN 16.8*  --   --   --   --   TRIG 67  --   --   --   --   < > = values in this interval not displayed.Corrected Ca = 10.9 Estimated Creatinine Clearance: 72.8 mL/min (by C-G formula based on Cr of 1.15).    Recent Labs  04/20/14 0005 04/20/14 0532 04/20/14 1157  GLUCAP 112* 118* 125*    Medical History: Past Medical History  Diagnosis Date  . Esophageal cancer 03/29/14   Insulin Requirements:   6 units sensitive SSI Novolog used in past 24h  Current Nutrition:    NPO  Clinimix 5/15 @ 40 ml/hr  Osmolite 1.5 via J-tube at 55 ml/hr (at goal rate)  IVF:   NS at 40 mL/hr  Central access: PICC placed 04/12/14 TPN start date: 04/12/14  ASSESSMENT                                                                                                          HPI:  58 y/o M with PMH of alcohol abuse (sober for last 9 years), chronic back and neck pain, and esophageal cancer with complete esophagus obstruction causing dysphagia. Admitted from [facility] on 12/8 due to inability to eat or drink and dehydration. He is s/p open feeding jejunostomy tube on 12/9 and developed significant n/v after TF  started. NGT inserted in IR 12/13 with immediate output. Pt developed fever, chills, tachycardia and hypoxia. CT of chest showed bilateral aspiration pneumonia, for which he is on broad spectrum antibiotics, Vancomycin and Zosyn. J tube became clogged, so patient ordered to be transitioned from tube feeds to TPN for nutrition, with pharmacy to assist with dosing.    SIGNIFICANT EVENTS                                                                                              12/16: Surgery MD states waiting for return of bowel function before restarting tube feeds 12/18: Surgery starting trickle TFs via J-tube, increase based on toleration. NGT still with copious output 12/19: Surgery increased Osmolite 1.5 to 30 mL/hr via J-tube. TPN reduced to 50 mL/hr.  12/20: Surgery increased Osmolite 1.5 to 50 mL/hr via J-tube, TPN rate reduced and lipids d/c. 12/21: Surgery increased Osmolite 1.5 to 55 mL/hr via J-tube (goal rate), requested for TPN to continue despite patient receiving >120% of their caloric and protein goals 12/22: NGT clamping trials, patient tolerating TFs at goal rate, Surgery requesting TPN to continue for now 12/23: TFs off last night d/t nausea, have not been running all day, plans to restart sometime this afternoon. UGI performed today did not show cause of continued nausea  Today, 04/20/2014:  Glucose - CBGs back at goal < 150  Electrolytes - improved to  WNL  Renal - SCr WNL  LFTs - New elevations of LFTs and Alk Phos (possibly due to TPN)  TGs - remain WNL   Prealbumin - 16.8 (12/21), 6.5 (12/16)  Intake not documented, NG output documented at 700 ml (12/22), decreased  NUTRITIONAL GOALS                                                                                             RD recs:  Kcal: 2000-2200 Protein: 105-115g Fluid: 2L/day  Clinimix E 5/15 at a goal rate of 90 ml/hr + 20% fat emulsion at 10 ml/hr to provide: 108 g/day protein, 2013 Kcal/day.    PLAN  1. Increase Clinimix 5/15 to 40 ml/hr and use formulation WITH electrolytes as patient not currently receiving TFs  TPN to contain standard multivitamins and trace elements. 2. No fat emulsion 3. Continue sensitive Scale SSI and CBG checks q6h.  4. TPN lab panels on Mondays & Thursdays 5. Continue mIVF NS at 40 ml/hr 6. Follow up enteral feedings toleration  Surgery to specify when OK to discontinue TPN  Thank you for the consult.  Currie Paris, PharmD, BCPS Pager: [phone] Pharmacy: [phone] 04/20/2014 2:06 PM

## 2014-04-20 NOTE — Progress Notes (Signed)
[facility] PROGRESS NOTE  Billy Romero OEV:[id number] DOB: Dec 04, 1955 DOA: 04/05/2014 PCP: Lujean Amel, MD  Admission history of present illness/brief narrative:   HPI: Billy Romero is a 58 y.o. 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 Admitted from [facility] due to inability to eat or drink and dehydration. Patient reports that symptoms has been steadily progressing and worsening over the last week or two and currently having trouble with keeping down even his own saliva. Plan is for patient to be admitted for IVF's resuscitation and also placement of J tube for tube feedings and nutrition. Patient had Placement open feeding jejunostomy tube by Dr. Scherrie Merritts 12/9. He had significant nausea and vomiting after his tube feeds were started, CT abdomen with contrast showing evidence of narrowing at the insertion type, as well having a kink at the insertion site . Given patient continuous nausea and vomiting, he had nasogastric tube inserted via fluoroscopic guidance by radiology, patient had 3600 mL output within half an hour from tube insertion, patient developed fever, hypoxia, tachycardia, CT chest showing evidence of bilateral aspiration pneumonia. Patient is improving on IV vancomycin and Zosyn, unfortunately his J-tube remains clogged, so he is being started on TPN. IV antibiotics have been discontinued.      Assessment/Plan: Dysphagia, pharyngoesophageal phase:  -secondary to complete obstruction of esophagus due to cancer. -continue IVF's resuscitation -Placement open feeding jejunostomy tube by Dr. Armandina Gemma 12/9. He stopped the tube feeds since last night. Reports his nausea is much better.  -electrolytes repletion as needed  Nausea and vomiting / Small bowel obstruction  at J-tube insertion site./Probable ileus -Secondary to Jejunum narrowing, and kink at the insertion site, patient is known to have history of congenital partial malrotation. - NG tube via fluoroscopic guidance inserted by radiology 12/13 , with significant improvement of symptoms . -UGI shows tube is not obstructing the lumen and thus possible ileus (patient has hypokalemia). Keep potassium greater than 4. Keep magnesium greater than 2. General surgery is following and appreciate input and recommendations.  Aspiration pneumonia  -Most likely related to copious amount of nausea and vomiting secondary to obstruction . -s/p IV  Zosyn (12/13>>>>04/17/14 ) -No further hypoxia .  Esophagus cancer:  -Following with oncology as an outpatient -plan is for radiation , radiation oncology follow-up appreciated. -CT surgery follow-up is appreciated.  Chronic back pain: - Continue current pain regimen and muscle relaxants   Protein-calorie malnutrition, moderate: - due to decrease PO intake and cancer. -dietician has been consulted - Continue TPN Patient has been started on feeds , but patient reports the feeds are making him nauseated. UPPER GI series ordered and pending.    Hypokalemia -Repleted. Keep potassium greater than 4.   Code Status: Full Family Communication: Updated patient and family at bedside. Disposition Plan: Home when ok with [facility].   Consultants:  Gen. surgery Dr.Gerkin 04/05/2014  Radiation oncology: Dr. Lisbeth Renshaw 04/06/2014  Procedures: -Placement open feeding jejunostomy tube by Dr. Scherrie Merritts 12/9 - NG tube inserted by radiology via fluoroscopic guidance 04/10/14 CT abdomen and pelvis 04/08/2014 CT chest 04/10/2014 Abdominal x-ray 04/08/2014, 04/12/2014 Water-soluble upper GI series 04/11/2014    Antibiotics:  IV vancomycin 04/10/2014>>>>04/16/14  IV Zosyn 04/10/2014>>>>>04/17/14  HPI/Subjective: Patient denies any emesis. Patient states as long as NG  tube is in place no further nausea  or emesis. Some improvement in abdominal pain. Has bowel movements and flatus. Pt stopped the tube feeds since yesterday.   Objective: Filed Vitals:   04/20/14 0537  BP: 137/87  Pulse: 90  Temp: 98.1 F (36.7 C)  Resp: 16    Intake/Output Summary (Last 24 hours) at 04/20/14 1013 Last data filed at 04/20/14 0849  Gross per 24 hour  Intake   1030 ml  Output   1325 ml  Net   -295 ml   Filed Weights   04/18/14 0537 04/19/14 1543 04/19/14 2215  Weight: 78.79 kg (173 lb 11.2 oz) 79.606 kg (175 lb 8 oz) 79.017 kg (174 lb 3.2 oz)    Exam:   General:  NAD. NG tube in place.  Cardiovascular: RRR  Respiratory: CTAB  Abdomen: Soft, J-tube in place. Nondistended. Positive bowel sounds.  Musculoskeletal: No clubbing cyanosis or edema.  Data Reviewed: Basic Metabolic Panel:  Recent Labs Lab 04/16/14 0650 04/17/14 0545 04/18/14 0535 04/18/14 0658 04/18/14 0949 04/19/14 0500 04/20/14 0500  NA 139 140 134*  --  138 141 138  K 4.8 4.4 6.0*  --  4.9 4.3 4.5  CL 101 103 100  --  101 108 106  CO2 26 24 22   --  24 25 26   GLUCOSE 137* 154* 635* 140* 127* 149* 110*  BUN 14 18 22   --  23 29* 35*  CREATININE 1.08 1.09 1.14  --  1.17 1.04 1.15  CALCIUM 9.8 9.6 9.9  --  10.5 9.8 9.8  MG 2.4  --  2.7*  --  2.6* 2.3 2.3  PHOS 4.2  --  6.6*  --  3.7 3.6 4.4   Liver Function Tests:  Recent Labs Lab 04/14/14 0800 04/18/14 0535 04/18/14 0949 04/19/14 0500 04/20/14 0500  AST 31 72* 87* 70* 75*  ALT 56* 150* 183* 171* 188*  ALKPHOS 76 120* 141* 129* 147*  BILITOT 1.4* 1.0 1.0 0.9 1.0  PROT 6.2 6.9 7.8 7.4 7.6  ALBUMIN 2.6* 2.8* 3.3* 3.4* 3.4*   No results for input(s): LIPASE, AMYLASE in the last 168 hours. No results for input(s): AMMONIA in the last 168 hours. CBC:  Recent Labs Lab 04/14/14 0800 04/17/14 0545 04/18/14 0535 04/19/14 0500 04/20/14 0500  WBC 9.5 14.0* 12.8* 14.7* 17.3*  NEUTROABS  --   --  9.8*  --   --   HGB 13.3  14.6 14.3 14.9 14.8  HCT 38.6* 43.8 45.1 45.0 46.2  MCV 85.0 87.3 90.7 89.5 89.4  PLT 209 312 395 369 419*   Cardiac Enzymes: No results for input(s): CKTOTAL, CKMB, CKMBINDEX, TROPONINI in the last 168 hours. BNP (last 3 results) No results for input(s): PROBNP in the last 8760 hours. CBG:  Recent Labs Lab 04/19/14 0701 04/19/14 1200 04/19/14 1802 04/20/14 0005 04/20/14 0532  GLUCAP 109* 147* 119* 112* 118*    Recent Results (from the past 240 hour(s))  Culture, blood (routine x 2)     Status: None   Collection Time: 04/10/14  6:05 PM  Result Value Ref Range Status   Specimen Description BLOOD LEFT HAND  Final   Special Requests BOTTLES DRAWN AEROBIC AND ANAEROBIC 5CC  Final   Culture  Setup Time   Final    04/11/2014 03:11 Performed at [facility]    Culture   Final    NO GROWTH 5 DAYS Performed at [facility]    Report Status 04/17/2014 FINAL  Final  Culture, blood (routine x 2)  Status: None   Collection Time: 04/10/14  6:27 PM  Result Value Ref Range Status   Specimen Description BLOOD LEFT ARM  Final   Special Requests BOTTLES DRAWN AEROBIC AND ANAEROBIC 10CC  Final   Culture  Setup Time   Final    04/11/2014 03:11 Performed at [facility]    Culture   Final    NO GROWTH 5 DAYS Note: Culture results may be compromised due to an excessive volume of blood received in culture bottles. Performed at [facility]    Report Status 04/17/2014 FINAL  Final  MRSA PCR Screening     Status: None   Collection Time: 04/10/14 10:02 PM  Result Value Ref Range Status   MRSA by PCR NEGATIVE NEGATIVE Final    Comment:        The GeneXpert MRSA Assay (FDA approved for NASAL specimens only), is one component of a comprehensive MRSA colonization surveillance program. It is not intended to diagnose MRSA infection nor to guide or monitor treatment for MRSA infections.   Culture, Urine     Status: None   Collection Time: 04/12/14   2:08 AM  Result Value Ref Range Status   Specimen Description URINE, CLEAN CATCH  Final   Special Requests none Normal  Final   Culture  Setup Time   Final    04/12/2014 11:21 Performed at [place] Performed at [facility]   Final   Culture NO GROWTH Performed at [facility]   Final   Report Status 04/13/2014 FINAL  Final     Studies: Dg Abd 2 Views  04/18/2014   CLINICAL DATA:  Nausea, vomiting  EXAM: ABDOMEN - 2 VIEW  COMPARISON:  04/12/2014  FINDINGS: Nasogastric tube coiled in the stomach. J-tube in satisfactory unchanged position. Oral contrast material is seen within the distal colon. There is no bowel dilatation to suggest obstruction. There is no evidence of pneumoperitoneum, portal venous gas or pneumatosis. There are no pathologic calcifications along the expected course of the ureters.The osseous structures are unremarkable.  IMPRESSION: Nasogastric tube and J-tube in satisfactory position.   Electronically Signed   By: Kathreen Devoid   On: 04/18/2014 13:38    Scheduled Meds: . antiseptic oral rinse  7 mL Mouth Rinse q12n4p  . chlorhexidine  15 mL Mouth Rinse BID  . enoxaparin (LOVENOX) injection  40 mg Subcutaneous Q24H  . feeding supplement (OSMOLITE 1.5 CAL)  1,000 mL Per Tube Q24H  . insulin aspart  0-9 Units Subcutaneous 4 times per day  . LORazepam  1 mg Intravenous Once  . pantoprazole (PROTONIX) IV  40 mg Intravenous Q12H  . sodium chloride  10-40 mL Intracatheter Q12H   Continuous Infusions: . sodium chloride 40 mL/hr at 04/20/14 0513  . TPN (CLINIMIX) Adult without lytes 20 mL/hr at 04/19/14 1721    Principal Problem:   Dysphagia, pharyngoesophageal phase Active Problems:   Esophagus cancer   Chronic back pain   Protein-calorie malnutrition, severe   Dysphagia   Malnutrition of moderate degree   Esophageal carcinoma   SBO (small bowel obstruction)   Ileus, postoperative   Nausea with  vomiting    Time spent: 25 minutes    Davinder Haff M.D. [facility] Pager [phone] If 7PM-7AM, please contact night-coverage at [email or web address], password [email or web address] 04/20/2014, 10:13 AM  LOS: 15 days

## 2014-04-20 NOTE — Progress Notes (Signed)
14 Days Post-Op  Subjective: Pt back on suction although it was sucking air and not from his stomach.  He also had them stop his tube feeding.  He is convinced that is what is causing the problem and we cannot convince him the two are not connected.  The fluid in the [place] is clear and green, no tube feeding noted.  Objective: Vital signs in last 24 hours: Temp:  [97.5 F (36.4 C)-98.1 F (36.7 C)] 98.1 F (36.7 C) (12/23 0537) Pulse Rate:  [90-94] 90 (12/23 0537) Resp:  [16-17] 16 (12/23 0537) BP: (124-141)/(87-91) 137/87 mmHg (12/23 0537) SpO2:  [98 %-100 %] 98 % (12/23 0537) Weight:  [79.017 kg (174 lb 3.2 oz)-79.606 kg (175 lb 8 oz)] 79.017 kg (174 lb 3.2 oz) (12/22 2215) Last BM Date: 04/19/14 700 from NG first shift recorded none after that 360 PO TF not recorded 1 BM recorded. Afebrile, VSS WBC is up to 17.3 LFT's stable Intake/Output from previous day: 12/22 0701 - 12/23 0700 In: 910 [P.O.:360; NG/GT:550] Out: 1325 [Urine:625; Emesis/NG output:700] Intake/Output this shift:    General appearance: alert, cooperative and he is just so frustrated and down, that you cannot really get him to understand what is occuring. GI: soft, incision looks fine, we can take the staples out BS hypoaciive.  he has had 1 BM yesterday.  Lab Results:   Recent Labs  04/19/14 0500 04/20/14 0500  WBC 14.7* 17.3*  HGB 14.9 14.8  HCT 45.0 46.2  PLT 369 419*    BMET  Recent Labs  04/19/14 0500 04/20/14 0500  NA 141 138  K 4.3 4.5  CL 108 106  CO2 25 26  GLUCOSE 149* 110*  BUN 29* 35*  CREATININE 1.04 1.15  CALCIUM 9.8 9.8   PT/INR No results for input(s): LABPROT, INR in the last 72 hours.   Recent Labs Lab 04/14/14 0800 05/15/2014 0535 05-15-14 0949 04/19/14 0500 04/20/14 0500  AST 31 72* 87* 70* 75*  ALT 56* 150* 183* 171* 188*  ALKPHOS 76 120* 141* 129* 147*  BILITOT 1.4* 1.0 1.0 0.9 1.0  PROT 6.2 6.9 7.8 7.4 7.6  ALBUMIN 2.6* 2.8* 3.3* 3.4* 3.4*     Lipase   No results found for: LIPASE   Studies/Results: Dg Abd 2 Views  15-May-2014   CLINICAL DATA:  Nausea, vomiting  EXAM: ABDOMEN - 2 VIEW  COMPARISON:  04/12/2014  FINDINGS: Nasogastric tube coiled in the stomach. J-tube in satisfactory unchanged position. Oral contrast material is seen within the distal colon. There is no bowel dilatation to suggest obstruction. There is no evidence of pneumoperitoneum, portal venous gas or pneumatosis. There are no pathologic calcifications along the expected course of the ureters.The osseous structures are unremarkable.  IMPRESSION: Nasogastric tube and J-tube in satisfactory position.   Electronically Signed   By: Kathreen Devoid   On: 05/15/2014 13:38    Medications: . antiseptic oral rinse  7 mL Mouth Rinse q12n4p  . chlorhexidine  15 mL Mouth Rinse BID  . enoxaparin (LOVENOX) injection  40 mg Subcutaneous Q24H  . feeding supplement (OSMOLITE 1.5 CAL)  1,000 mL Per Tube Q24H  . insulin aspart  0-9 Units Subcutaneous 4 times per day  . LORazepam  1 mg Intravenous Once  . pantoprazole (PROTONIX) IV  40 mg Intravenous Q12H  . sodium chloride  10-40 mL Intracatheter Q12H   . sodium chloride 40 mL/hr at 04/20/14 0513  . TPN (CLINIMIX) Adult without lytes 20 mL/hr at 04/19/14  1721    Assessment/Plan 1. Esophageal cancer with complete obstruction Placement open feeding jejunostomy tube, 04/06/2014, Armandina Gemma, MD  IR opened tube with wire12/15/15 IR NG tube placement 04/10/14 4 liters reportedly drained 2. post op nausea, vomiting and J tube obstruction 3. Post op pneumonia, most likely from aspiration 4. Chronic back and neck pain 5.  Malnutrition  6. Aspiration pneumonia 7.  Depression and anxiety  Plan:  I will repeat his UGI and give thru NG.  We will let the nurses remove the staples and steri strip the incision today.  Ongoing nausea        LOS: 15 days    Yailene Badia 04/20/2014

## 2014-04-20 NOTE — Progress Notes (Signed)
NUTRITION FOLLOW UP  Intervention:   - TPN per pharmacy.  - Recommending resuming TF regimen once medically able- Osmolite 1.5 @ 55 mL/hr with Prostat once daily.   TF regimen to provide 2080 kcal, 98 g protein and 1003 mL of free water. TF to be provided over 24 hours.   - RD will continue to monitor.   For D/C: -Recommend 24 hr infusion during admit with transition to 32-67 hour cyclic regimen once pt stable for d/c-Recommend CM c/s for assistance with home nutrition support needs  Nutrition Dx:   Inadequate oral intake related to esophageal cancer as evidenced by need for nutrition support, ongoing.  Goal:   Pt to meet >/= 90% of their estimated nutrition needs, will meet with TPN   Monitor:   TPN regimen & tolerance, weight, labs, I/O's  Assessment:   58 y.o. Admitted with progressive dysphagia. He is now unable to tolerate any liquids. PMHX of Esophagus cancer, squamous cell carcinoma, Weight loss, and ulcerative colitis.  12/21: - J-tube placement 12/9.  - Probable ileus secondary to jejunum narrowing and kink at insertion site. Improvement of symptoms with NG tube placement 12/13. Pt with nausea when NG tube is clamped. Per MD note, will leave tube unclamped for now. Large output is clear green, no TF in current cannister.   12/22: TPN plan per pharmacy Decrease Clinimix 5/15 to 20 ml/hr and continue to use formulation WITHOUT electrolytes as patient receiving electrolytes in TFs  12/23: - TF discontinued per pt request. He says that they are backing up and causing his nausea and he can taste them.  NG tube also placed back on suction. - Per surgery note, NG tube output has no evidenced of TF and is sucking air from stomach. Repeat upper GI requested. - Spoke with RN and pt. Pt says that he is agreeable to re-start TF after testing and radiation treatment this afternoon. NG tube continues to have high output. Pt reports that he vomited last night.  - Pt being weaned off  TPN.   Labs: Na and K WNL BUN elevated Mg and Phos WNL Albumin low  Details for Home Feedings Feedings per J-tube TF: Osmolite 1.5 @ goal rate of 110 ml/hr over 12 hours (tentatively 8pm-8am).  30 ml Prostat BID Free water: 60 ml before and after each continuous feeding. Patient will need an additional 875 ml per day (~8 oz QID).  Above to provide: 2180 kcal, 113g protein, and 2000 ml free water  Height: Ht Readings from Last 1 Encounters:  04/12/14 5\' 11"  (1.803 m)    Weight Status:   Wt Readings from Last 1 Encounters:  04/19/14 174 lb 3.2 oz (79.017 kg)    Re-estimated needs:  Kcal: 2000-2200 Protein: 105-115 g Fluid: 2 L/day  Skin: intact; surgical incision  Diet Order: Diet NPO time specified Except for: Ice Chips TPN (CLINIMIX) Adult without lytes   Intake/Output Summary (Last 24 hours) at 04/20/14 1112 Last data filed at 04/20/14 0849  Gross per 24 hour  Intake   1030 ml  Output   1325 ml  Net   -295 ml    Last BM: 12/23   Labs:   Recent Labs Lab 04/18/14 0949 04/19/14 0500 04/20/14 0500  NA 138 141 138  K 4.9 4.3 4.5  CL 101 108 106  CO2 24 25 26   BUN 23 29* 35*  CREATININE 1.17 1.04 1.15  CALCIUM 10.5 9.8 9.8  MG 2.6* 2.3 2.3  PHOS 3.7 3.6  4.4  GLUCOSE 127* 149* 110*    CBG (last 3)   Recent Labs  04/19/14 1802 04/20/14 0005 04/20/14 0532  GLUCAP 119* 112* 118*    Scheduled Meds: . antiseptic oral rinse  7 mL Mouth Rinse q12n4p  . chlorhexidine  15 mL Mouth Rinse BID  . enoxaparin (LOVENOX) injection  40 mg Subcutaneous Q24H  . feeding supplement (OSMOLITE 1.5 CAL)  1,000 mL Per Tube Q24H  . insulin aspart  0-9 Units Subcutaneous 4 times per day  . LORazepam  1 mg Intravenous Once  . pantoprazole (PROTONIX) IV  40 mg Intravenous Q12H  . sodium chloride  10-40 mL Intracatheter Q12H    Continuous Infusions: . sodium chloride 40 mL/hr at 04/20/14 0513  . TPN [facility]) Adult without lytes 20 mL/hr at 04/19/14 [place] MS, RD, LDN

## 2014-04-21 ENCOUNTER — Telehealth: Payer: Self-pay | Admitting: Oncology

## 2014-04-21 ENCOUNTER — Other Ambulatory Visit: Payer: Self-pay | Admitting: *Deleted

## 2014-04-21 ENCOUNTER — Telehealth: Payer: Self-pay | Admitting: *Deleted

## 2014-04-21 ENCOUNTER — Ambulatory Visit: Payer: 59

## 2014-04-21 DIAGNOSIS — C159 Malignant neoplasm of esophagus, unspecified: Secondary | ICD-10-CM

## 2014-04-21 DIAGNOSIS — R1319 Other dysphagia: Secondary | ICD-10-CM

## 2014-04-21 LAB — COMPREHENSIVE METABOLIC PANEL
ALT: 163 U/L — AB (ref 0–53)
AST: 62 U/L — AB (ref 0–37)
Albumin: 3 g/dL — ABNORMAL LOW (ref 3.5–5.2)
Alkaline Phosphatase: 126 U/L — ABNORMAL HIGH (ref 39–117)
Anion gap: 4 — ABNORMAL LOW (ref 5–15)
BILIRUBIN TOTAL: 0.6 mg/dL (ref 0.3–1.2)
BUN: 32 mg/dL — ABNORMAL HIGH (ref 6–23)
CHLORIDE: 107 meq/L (ref 96–112)
CO2: 28 mmol/L (ref 19–32)
Calcium: 9.2 mg/dL (ref 8.4–10.5)
Creatinine, Ser: 1.07 mg/dL (ref 0.50–1.35)
GFR calc Af Amer: 85 mL/min — ABNORMAL LOW (ref >90)
GFR, EST NON AFRICAN AMERICAN: 74 mL/min — AB (ref >90)
Glucose, Bld: 140 mg/dL — ABNORMAL HIGH (ref 70–99)
POTASSIUM: 3.8 mmol/L (ref 3.5–5.1)
SODIUM: 139 mmol/L (ref 135–145)
Total Protein: 6.5 g/dL (ref 6.0–8.3)

## 2014-04-21 LAB — GLUCOSE, CAPILLARY
GLUCOSE-CAPILLARY: 122 mg/dL — AB (ref 70–99)
Glucose-Capillary: 152 mg/dL — ABNORMAL HIGH (ref 70–99)

## 2014-04-21 LAB — PHOSPHORUS: Phosphorus: 3.6 mg/dL (ref 2.3–4.6)

## 2014-04-21 LAB — MAGNESIUM: Magnesium: 2.2 mg/dL (ref 1.5–2.5)

## 2014-04-21 MED ORDER — HEPARIN SOD (PORK) LOCK FLUSH 100 UNIT/ML IV SOLN: 500.0000 [IU] | INTRAVENOUS | Status: DC | PRN

## 2014-04-21 MED ORDER — OXYCODONE HCL 5 MG/5ML PO SOLN: 5.0000 mg | ORAL | Status: DC | PRN

## 2014-04-21 MED ORDER — PROMETHAZINE HCL 6.25 MG/5ML PO SYRP: 12.5000 mg | ORAL_SOLUTION | Freq: Four times a day (QID) | ORAL | Status: DC | PRN

## 2014-04-21 MED ORDER — OSMOLITE 1.5 CAL PO LIQD: 1000.0000 mL | ORAL | Status: DC

## 2014-04-21 NOTE — Progress Notes (Signed)
Patient discharged to home, all discharge medications and instructions reviewed and questions answered.  Patient to be assisted to vehicle by wheelchair.  

## 2014-04-21 NOTE — Progress Notes (Signed)
Staples removed per MD order, 11 staples removed and steri-strips applied.   Patient tolerated procedure well.

## 2014-04-21 NOTE — Telephone Encounter (Signed)
Per 12/24 POF MD added to 12/30 at 10:15 D/T per MD ok to DB..... KJ

## 2014-04-21 NOTE — Progress Notes (Signed)
General Surgery Note  LOS: 16 days  POD -  15 Days Post-Op  Assessment/Plan: 1.  OPEN JEJUNOSTOMY TUBE PLACEMENT - 04/06/2014  IR opened tube with wire12/15/15 IR NG tube placement 04/10/14 4 liters reportedly drained  No small bowel obstruction on UGI - 04/20/2014  Looks good today - can go home.  His staples can be removed.  He has been in the hospital long enough that he does not need follow up with our office.  After treatment, when he is ready to remove the J tube - contact our office to see Dr. Harlow Asa.  2. Esophageal cancer with complete obstruction  3. Post op pneumonia, most likely from aspiration 4. Chronic back and neck pain 5.  Malnutrition  6. Aspiration pneumonia 7. Depression and anxiety 8.  DVT prophylaxis - Lovenox   Principal Problem:   Dysphagia, pharyngoesophageal phase Active Problems:   Esophagus cancer   Chronic back pain   Protein-calorie malnutrition, severe   Dysphagia   Malnutrition of moderate degree   Esophageal carcinoma   SBO (small bowel obstruction)   Ileus, postoperative   Nausea with vomiting  Subjective:  Doing much better.  His son is in the room.  Ready to go home. Objective:   Filed Vitals:   04/21/14 0602  BP: 120/71  Pulse: 90  Temp: 98 F (36.7 C)  Resp: 16     Intake/Output from previous day:  12/23 0701 - 12/24 0700 In: 13490.4 [P.O.:360; I.V.:5018.4; RC/VE:9381; OFB:5102] Out: 1100 [Urine:1100]  Intake/Output this shift:  Total I/O In: 240 [P.O.:240] Out: 100 [Urine:100]   Physical Exam:   General: WM who is alert and oriented.    HEENT: Normal. Pupils equal. .   Lungs: Clear   Abdomen: Soft   Wound: Clean.  J tube left abdomen looks good and is working.   Lab Results:    Recent Labs  04/19/14 0500 04/20/14 0500  WBC 14.7* 17.3*  HGB 14.9 14.8  HCT 45.0 46.2  PLT 369 419*    BMET   Recent Labs  04/20/14 0500 04/21/14 0618  NA 138 139  K 4.5 3.8  CL 106 107  CO2  26 28  GLUCOSE 110* 140*  BUN 35* 32*  CREATININE 1.15 1.07  CALCIUM 9.8 9.2    PT/INR  No results for input(s): LABPROT, INR in the last 72 hours.  ABG  No results for input(s): PHART, HCO3 in the last 72 hours.  Invalid input(s): PCO2, PO2   Studies/Results:  Dg Ugi W/water Sol Cm  04/20/2014   CLINICAL DATA:  Esophageal malignancy, high NG output, nausea opening G-tube is clamped  EXAM: WATER SOLUBLE UPPER GI SERIES  TECHNIQUE: Single-column upper GI series was performed using water soluble contrast.  CONTRAST:  58mL OMNIPAQUE IOHEXOL 300 MG/ML  SOLN  COMPARISON:  Abdominal series April 18, 2014  FLUOROSCOPY TIME:  0 min 52 seconds  FINDINGS: A nasogastric tube and jejunal tube are present. Omnipaque 300 was ingested orally initially. High-grade obstruction in the proximal thoracic esophagus was demonstrated with a small amount of contrast passing distally surrounding the nasogastric tube. The distal esophagus was unremarkable.  Subsequently 60 cc of contrast was instilled through the nasogastric tube. The gastric lumen distended reasonably well. Thickening of the mucosal fold pattern was noted. Gastric emptying into the duodenum was prompt. The duodenum appeared normal in caliber. The contrast passed through the duodenum into the proximal jejunum, surrounded the jejunal tube tip, and passed even more distally. The jejunal mucosal  folds were normal. There was no evidence of retained jejunal fluid.  IMPRESSION: 1. There is known high-grade obstruction in the proximal third of the esophagus. A small amount of barium did pass around the nasogastric tube. The visualized portions of the mid and distal esophagus were unremarkable. 2. The stomach, duodenum, and proximal jejunum did not appear abnormally dilated nor was are significant residual fluid present. The contrast administered through the nasogastric tube passed promptly from the stomach, through the duodenum, and into the proximal to mid  jejunum.   Electronically Signed   By: Sahith Nurse  [place]   On: 04/20/2014 13:19     Anti-infectives:   Anti-infectives    Start     Dose/Rate Route Frequency Ordered Stop   04/16/14 2000  vancomycin (VANCOCIN) IVPB 1000 mg/200 mL premix  Status:  Discontinued     1,000 mg200 mL/hr over 60 Minutes Intravenous Every 12 hours 04/16/14 1354 04/16/14 1727   04/12/14 2200  vancomycin (VANCOCIN) 1,250 mg in sodium chloride 0.9 % 250 mL IVPB  Status:  Discontinued     1,250 mg166.7 mL/hr over 90 Minutes Intravenous Every 8 hours 04/12/14 1455 04/16/14 1336   04/11/14 0600  piperacillin-tazobactam (ZOSYN) IVPB 3.375 g     3.375 g12.5 mL/hr over 240 Minutes Intravenous Every 8 hours 04/10/14 2111 04/18/14 0142   04/11/14 0600  vancomycin (VANCOCIN) IVPB 1000 mg/200 mL premix  Status:  Discontinued     1,000 mg200 mL/hr over 60 Minutes Intravenous Every 8 hours 04/10/14 2111 04/12/14 1454   04/11/14 0400  piperacillin-tazobactam (ZOSYN) IVPB 3.375 g  Status:  Discontinued     3.375 g12.5 mL/hr over 240 Minutes Intravenous Every 8 hours 04/10/14 1910 04/10/14 2110   04/11/14 0400  vancomycin (VANCOCIN) IVPB 1000 mg/200 mL premix  Status:  Discontinued     1,000 mg200 mL/hr over 60 Minutes Intravenous Every 8 hours 04/10/14 1910 04/10/14 2110   04/10/14 2130  piperacillin-tazobactam (ZOSYN) IVPB 3.375 g     3.375 g100 mL/hr over 30 Minutes Intravenous  Once 04/10/14 2111 04/10/14 2155   04/10/14 2130  vancomycin (VANCOCIN) IVPB 1000 mg/200 mL premix     1,000 mg200 mL/hr over 60 Minutes Intravenous  Once 04/10/14 2111 04/10/14 2225   04/10/14 1800  vancomycin (VANCOCIN) IVPB 1000 mg/200 mL premix  Status:  Discontinued     1,000 mg200 mL/hr over 60 Minutes Intravenous  Once 04/10/14 1741 04/10/14 2110   04/10/14 1800  piperacillin-tazobactam (ZOSYN) IVPB 3.375 g  Status:  Discontinued     3.375 g12.5 mL/hr over 240 Minutes Intravenous  Once 04/10/14 1741 04/10/14 2110   04/06/14 1245  cefOXitin (MEFOXIN) 2  g in dextrose 5 % 50 mL IVPB     2 g100 mL/hr over 30 Minutes Intravenous On call to O.R. 04/06/14 1231 04/06/14 1305      Alphonsa Overall, MD, FACS Pager: [facility] Office: [phone] 04/21/2014

## 2014-04-21 NOTE — Telephone Encounter (Signed)
Call from pt's wife requesting clarification of Decadron pre-med instructions. Pt was prescribed tablets, he has a feeding tube. Confirmed with pharmacy the tablets are OK to crush. Per [facility] pharmacist, liquid Decadron 10 mg would be in a large amount of solution. She recommends crushing tablets. Requested they review instructions with pt/ family when they pick up med.  Spoke with pt's wife, home health RN is scheduled to come to home this evening. She will ask to be shown how to administer med. Pt is able to tolerate some liquids PO. Will decide after using tube over the weekend whether he will try to take orally.

## 2014-04-21 NOTE — Progress Notes (Signed)
IP PROGRESS NOTE  Subjective:   The upper GI series yesterday did not show obstruction and the NG tube was removed. He reports no nausea/vomiting. He is having bowel movements. He is tolerating liquids by mouth.  Objective: Vital signs in last 24 hours: Blood pressure 120/71, pulse 90, temperature 98 F (36.7 C), temperature source Oral, resp. rate 16, height 5\' 11"  (1.803 m), weight 175 lb 3.2 oz (79.47 kg), SpO2 100 %.  Intake/Output from previous day: 12/23 0701 - 12/24 0700 In: 13490.4 [P.O.:360; I.V.:5018.4; JY/NW:2956; OZH:0865] Out: 1100 [Urine:1100]  Physical Exam:  Lungs: Clear bilaterally Cardiac: Regular rate and rhythm Abdomen: Nondistended, soft, left upper quadrant jejunostomy tube, healed midline incision with staples in place Extremities: No leg edema   Portacath/PICC-without erythema  Lab Results:  Recent Labs  04/19/14 0500 04/20/14 0500  WBC 14.7* 17.3*  HGB 14.9 14.8  HCT 45.0 46.2  PLT 369 419*    BMET  Recent Labs  04/20/14 0500 04/21/14 0618  NA 138 139  K 4.5 3.8  CL 106 107  CO2 26 28  GLUCOSE 110* 140*  BUN 35* 32*  CREATININE 1.15 1.07  CALCIUM 9.8 9.2    Studies/Results: Dg Ugi W/water Sol Cm  04/20/2014   CLINICAL DATA:  Esophageal malignancy, high NG output, nausea opening G-tube is clamped  EXAM: WATER SOLUBLE UPPER GI SERIES  TECHNIQUE: Single-column upper GI series was performed using water soluble contrast.  CONTRAST:  34mL OMNIPAQUE IOHEXOL 300 MG/ML  SOLN  COMPARISON:  Abdominal series April 18, 2014  FLUOROSCOPY TIME:  0 min 52 seconds  FINDINGS: A nasogastric tube and jejunal tube are present. Omnipaque 300 was ingested orally initially. High-grade obstruction in the proximal thoracic esophagus was demonstrated with a small amount of contrast passing distally surrounding the nasogastric tube. The distal esophagus was unremarkable.  Subsequently 60 cc of contrast was instilled through the nasogastric tube. The gastric  lumen distended reasonably well. Thickening of the mucosal fold pattern was noted. Gastric emptying into the duodenum was prompt. The duodenum appeared normal in caliber. The contrast passed through the duodenum into the proximal jejunum, surrounded the jejunal tube tip, and passed even more distally. The jejunal mucosal folds were normal. There was no evidence of retained jejunal fluid.  IMPRESSION: 1. There is known high-grade obstruction in the proximal third of the esophagus. A small amount of barium did pass around the nasogastric tube. The visualized portions of the mid and distal esophagus were unremarkable. 2. The stomach, duodenum, and proximal jejunum did not appear abnormally dilated nor was are significant residual fluid present. The contrast administered through the nasogastric tube passed promptly from the stomach, through the duodenum, and into the proximal to mid jejunum.   Electronically Signed   By: David  Martinique   On: 04/20/2014 13:19    Medications: I have reviewed the patient's current medications.  Assessment/Plan:  1. 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 Solid/liquid dysphagia secondary to #1  Jejunostomy feeding tube placement 04/07/2014  3. Small bowel obstruction following placement of the jejunostomy feeding tube-resolved  4.  History of ulcerative colitis   Mr. Revak appears well today. He does not have obstructive symptoms at present. The tube feedings are at goal rate. He appears stable for discharge to home from an oncology standpoint.  The plan is to begin concurrent radiation and weekly Taxol/carboplatin chemotherapy during the week of 04/25/2014. I reviewed the potential  toxicities associated with the chemotherapy regimen with him today. He understands the chance for nausea/vomiting, mucositis, diarrhea, alopecia, and allergic reaction, and hematologic toxicity. We discussed  the neuropathy associated with Taxol. He agrees to proceed.  He will be scheduled for an office visit and the first cycle of chemotherapy 04/27/2014.  Please call oncology as needed.   LOS: 16 days   Wade Sigala, [place]  04/21/2014, 10:03 AM

## 2014-04-21 NOTE — Telephone Encounter (Signed)
Spoke with pt's son. Reviewed appts and PET scan and Decadron instructions. Pt to hold tube feedings for 6 hours prior to PET. He voiced understanding. Encouraged them to call office with any questions prior to next visit. Pt will be seen by MD in treatment room during first chemo.

## 2014-04-21 NOTE — Progress Notes (Signed)
Hospital lab unable to add Diff to CBC done 12/23. Labs ordered prior to treatment.

## 2014-04-21 NOTE — Progress Notes (Signed)
NUTRITION FOLLOW UP  Intervention:   - TPN per pharmacy. D/C TPN per Surgery. - Continue TF regimen- Osmolite 1.5 @ 55 mL/hr with Prostat once daily.  -TF regimen to provide 2080 kcal, 98 g protein and 1003 mL of free water. TF to be provided over 24 hours.  - RD will continue to monitor.   For D/C: -Recommend 24 hr infusion during admit with transition to 54-27 hour cyclic regimen once pt stable for d/c-Recommend CM c/s for assistance with home nutrition support needs  Details for Home Feedings Feedings per J-tube TF: Osmolite 1.5 @ goal rate of 110 ml/hr over 12 hours (tentatively 8pm-8am).  30 ml Prostat BID Free water: 60 ml before and after each continuous feeding. Patient will need an additional 875 ml per day: 237 ml (8 oz) of free water every 6 hours.   Above to provide: 2180 kcal, 113g protein, and 2000 ml free water  Nutrition Dx:   Inadequate oral intake related to esophageal cancer as evidenced by need for nutrition support, ongoing.  Goal:   Pt to meet >/= 90% of their estimated nutrition needs, will meet with TPN   Monitor:   TPN regimen & tolerance, weight, labs, I/O's  Assessment:   58 y.o. Admitted with progressive dysphagia. He is now unable to tolerate any liquids. PMHX of Esophagus cancer, squamous cell carcinoma, Weight loss, and ulcerative colitis.  12/21: - J-tube placement 12/9.  - Probable ileus secondary to jejunum narrowing and kink at insertion site. Improvement of symptoms with NG tube placement 12/13. Pt with nausea when NG tube is clamped. Per MD note, will leave tube unclamped for now. Large output is clear green, no TF in current cannister.   12/22: TPN plan per pharmacy Decrease Clinimix 5/15 to 20 ml/hr and continue to use formulation WITHOUT electrolytes as patient receiving electrolytes in TFs  12/23: - TF discontinued per pt request. He says that they are backing up and causing his nausea and he can taste them.  NG tube also placed back on  suction. - Per surgery note, NG tube output has no evidenced of TF and is sucking air from stomach. Repeat upper GI requested. - Spoke with RN and pt. Pt says that he is agreeable to re-start TF after testing and radiation treatment this afternoon. NG tube continues to have high output. Pt reports that he vomited last night.  - Pt being weaned off TPN.   12/24 -Per RN, pt is at goal rate of Osmolite 1.5 @ 55ml/hr. Pt is tolerating with no N/V. Pt may discharge today. Needed free water orders. -NGT removed. Pt is tolerating liquids PO.  Labs: Na and K WNL BUN elevated Mg and Phos WNL Albumin low   Above to provide: 2180 kcal, 113g protein, and 2000 ml free water  Height: Ht Readings from Last 1 Encounters:  04/12/14 5\' 11"  (1.803 m)    Weight Status:   Wt Readings from Last 1 Encounters:  04/21/14 175 lb 3.2 oz (79.47 kg)    Re-estimated needs:  Kcal: 2000-2200 Protein: 105-115 g Fluid: 2 L/day  Skin: intact; surgical incision  Diet Order: Diet NPO time specified Except for: Ice Chips   Intake/Output Summary (Last 24 hours) at 04/21/14 1009 Last data filed at 04/21/14 0814  Gross per 24 hour  Intake 13610.4 ml  Output   1200 ml  Net 12410.4 ml    Last BM: 12/23   Labs:   Recent Labs Lab 04/19/14 0500 04/20/14 0500 04/21/14  0618  NA 141 138 139  K 4.3 4.5 3.8  CL 108 106 107  CO2 25 26 28   BUN 29* 35* 32*  CREATININE 1.04 1.15 1.07  CALCIUM 9.8 9.8 9.2  MG 2.3 2.3 2.2  PHOS 3.6 4.4 3.6  GLUCOSE 149* 110* 140*    CBG (last 3)   Recent Labs  04/20/14 1816 04/21/14 0034 04/21/14 0558  GLUCAP 135* 152* 122*    Scheduled Meds: . antiseptic oral rinse  7 mL Mouth Rinse q12n4p  . chlorhexidine  15 mL Mouth Rinse BID  . enoxaparin (LOVENOX) injection  40 mg Subcutaneous Q24H  . feeding supplement (OSMOLITE 1.5 CAL)  1,000 mL Per Tube Q24H  . insulin aspart  0-9 Units Subcutaneous 4 times per day  . LORazepam  1 mg Intravenous Once  .  pantoprazole (PROTONIX) IV  40 mg Intravenous Q12H  . sodium chloride  10-40 mL Intracatheter Q12H    Continuous Infusions:    Clayton Bibles, MS, RD, LDN Pager: [phone] After Hours Pager: [phone]

## 2014-04-23 NOTE — Discharge Summary (Signed)
Physician Discharge Summary  Billy Romero DXI:[id number] DOB: [birth year] DOA: 04/05/2014  PCP: Lujean Amel, MD  Admit date: 04/05/2014 Discharge date: 04/21/2014  Time spent: 30 minutes  Recommendations for Outpatient Follow-up:  1. Follow up with home health PT, RN 2. Follow up with liver panel in 2 to 4 weeks .  Discharge Diagnoses:  Principal Problem:   Dysphagia, pharyngoesophageal phase Active Problems:   Esophagus cancer   Chronic back pain   Protein-calorie malnutrition, severe   Dysphagia   Malnutrition of moderate degree   Esophageal carcinoma   SBO (small bowel obstruction)   Ileus, postoperative   Nausea with vomiting   Discharge Condition: improved.   Diet recommendation: tube feeding.   Filed Weights   04/19/14 1543 04/19/14 2215 04/21/14 0602  Weight: 79.606 kg (175 lb 8 oz) 79.017 kg (174 lb 3.2 oz) 79.47 kg (175 lb 3.2 oz)    History of present illness:  Billy Romero is a 58 y.o. 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 Admitted from [facility] due to inability to eat or drink and dehydration. Patient reports that symptoms has been steadily progressing and worsening over the last week or two and currently having trouble with keeping down even his own saliva.  Patient was admitted for IVF's resuscitation and also placement of J tube for tube feedings and nutrition. He underwent open feeding jejunostomy tube by Dr. Scherrie Merritts 12/9. He had significant nausea and vomiting after his tube feeds were started, CT abdomen with contrast showing evidence of narrowing at the insertion type, as well having a kink at the insertion site . Given patient continuous nausea and vomiting, he had nasogastric tube inserted via fluoroscopic guidance by radiology, and had 3600 mL  output within half an hour from tube insertion,.  Patient also developed fever, hypoxia, tachycardia, and CT chest showing evidence of bilateral aspiration pneumonia. He was started  on IV vancomycin and Zosyn and completed the course of antibiotics. His symptoms of nausea and vomiting improved and he was discharged home on tube feeds.   Hospital Course: Dysphagia, pharyngoesophageal phase:  -secondary to complete obstruction of esophagus due to cancer. -Placement open feeding jejunostomy tube by Dr. Armandina Gemma 12/9. -electrolytes repletion as needed  Nausea and vomiting / Small bowel obstruction at J-tube insertion site./Probable ileus -Secondary to Jejunum narrowing, and kink at the insertion site, patient is known to have history of congenital partial malrotation. - NG tube via fluoroscopic guidance inserted by radiology 12/13 , with significant improvement of symptoms . Later on as symptoms improved , NG tube taken out and tube feeds started.   Aspiration pneumonia  -Most likely related to copious amount of nausea and vomiting secondary to obstruction . -s/p IV Zosyn (12/13>>>>04/17/14 ) -No further hypoxia .  Esophagus cancer:  -Following with oncology as an outpatient -plan is for radiation , radiation oncology follow-up appreciated. -CT surgery follow-up is appreciated.  Chronic back pain: - Continue current pain regimen and muscle relaxants   Protein-calorie malnutrition, moderate: - due to decrease PO intake and cancer. -dietician has been consulted He was started on TPn initially and later on transitioned to tube feeds.     Hypokalemia -Repleted. Keep potassium greater than 4.    Procedures: Placement open feeding jejunostomy tube by Dr. Scherrie Merritts 12/9 - NG tube inserted  by radiology via fluoroscopic guidance 04/10/14 CT abdomen and pelvis 04/08/2014 CT chest 04/10/2014 Abdominal x-ray 04/08/2014, 04/12/2014 Water-soluble upper GI series  04/11/2014  Consultations:  Gen. surgery Dr.Gerkin 04/05/2014  Radiation oncology: Dr. Lisbeth Renshaw 04/06/2014  Discharge Exam: Filed Vitals:   04/21/14 0602  BP: 120/71  Pulse: 90  Temp: 98 F (36.7 C)  Resp: 16    General: alert afebrile comfortable Cardiovascular: s1s2 Respiratory: ctab  Discharge Instructions   Discharge Instructions    Discharge instructions    Complete by:  As directed   Follow up with DR Gerkin as recommended Follow up with Dr Learta Codding as recommended.          Current Discharge Medication List    START taking these medications   Details  Nutritional Supplements (FEEDING SUPPLEMENT, OSMOLITE 1.5 CAL,) LIQD Place 1,000 mLs into feeding tube daily. Refills: 0    oxyCODONE (ROXICODONE) 5 MG/5ML solution Take 5 mLs (5 mg total) by mouth every 4 (four) hours as needed for severe pain (via peg tube). Qty: 15 mL, Refills: 2    promethazine (PHENERGAN) 6.25 MG/5ML syrup Take 10 mLs (12.5 mg total) by mouth every 6 (six) hours as needed for nausea or vomiting. Qty: 120 mL, Refills: 1      CONTINUE these medications which have NOT CHANGED   Details  baclofen (LIORESAL) 10 MG tablet Take 10 mg by mouth 3 (three) times daily as needed for muscle spasms.  Refills: 2    dexamethasone (DECADRON) 2 MG tablet Take 5 tablets (10 mg total) by mouth as directed. Take 10 mg at 10 pm night before 1st chemo and at 6 am morning of 1st chemo Qty: 10 tablet, Refills: 0    PRESCRIPTION MEDICATION Chem [facility]    prochlorperazine (COMPAZINE) 10 MG tablet Take 1 tablet (10 mg total) by mouth every 6 (six) hours as needed for nausea or vomiting. Qty: 20 tablet, Refills: 1      STOP taking these medications     HYDROcodone-acetaminophen (NORCO/VICODIN) 5-325 MG per tablet      meloxicam (MOBIC) 15 MG tablet        No Known Allergies Follow-up Information    Follow up with Lujean Amel, MD. Schedule an appointment as soon as possible for a visit in 1 week.    Specialty:  Family Medicine   Contact information:   [address] [place] [address] [phone]       Follow up with Earnstine Regal, MD.   Specialty:  General Surgery   Why:  As needed   Contact information:   [address] [place] [address] [phone]       Follow up with Betsy Coder, MD. Schedule an appointment as soon as possible for a visit in 1 week.   Specialty:  Oncology   Contact information:   [place] [address] [phone]        The results of significant diagnostics from this hospitalization (including imaging, microbiology, ancillary and laboratory) are listed below for reference.    Significant Diagnostic Studies: Dg Abd 1 View  04/12/2014   CLINICAL DATA:  Nonfunctioning jejunostomy tube.  EXAM: ABDOMEN - 1 VIEW  COMPARISON:  04/11/2014  FINDINGS: The jejunostomy tube is unchanged in position. It was demonstrated to be within the jejunum during the upper GI dated 04/11/2014. NG tube is coiled in the fundus of the stomach.  Bowel gas pattern is normal. Contrast from the previous upper GI  is now in the nondistended colon. There are no visible dilated loops of bowel.  IMPRESSION: Tubes appear in good position. No dilated large or small bowel. Contrast has passed from the small bowel into the colon.   Electronically Signed   By: Rozetta Nunnery M.D.   On: 04/12/2014 11:11   Ct Chest Wo Contrast  04/10/2014   CLINICAL DATA:  Fever and chills following nasogastric catheter placement  EXAM: CT CHEST WITHOUT CONTRAST  TECHNIQUE: Multidetector CT imaging of the chest was performed following the standard protocol without IV contrast.  COMPARISON:  CT of the abdomen and pelvis from 04/08/2014  FINDINGS: The lungs are well aerated bilaterally and demonstrate diffuse infiltrative change particularly in the posterior aspect of the right upper lobe as well as the lower lobes bilaterally. Scattered sub solid nodular  changes are noted associated with the infiltrate. Considerable fluid is noted within the esophagus consistent with the clinical history of esophageal carcinoma raising suspicion for possible aspiration as a cause for the infiltrate.  Nasogastric catheter is noted coiled within the stomach. No air is noted surrounding the esophagus to suggest perforation. Persistent free air is noted within the abdomen related to the recent jejunostomy placement. This is stable from the prior CT examination.  IMPRESSION: New bilateral infiltrative changes as described. The changes in the esophagus raises suspicion for possible aspiration pneumonia.  Persistent free air within the abdomen stable from the previous exam.   Electronically Signed   By: Inez Catalina M.D.   On: 04/10/2014 19:21   Ct Abdomen Pelvis W Contrast  04/09/2014   ADDENDUM REPORT: 04/09/2014 17:03  ADDENDUM: I spoke with Dr. Waldron Labs today to discuss the patient CT findings. We talked about the malrotated position of the duodenojejunal junction with reversal of the normal SMA-SMV relationship compatible with known congenital malrotation. This malrotated segment of small bowel is inflamed just proximal to the entry of the J-tube. He states patient is doing better clinically today and under close clinical surveillance by surgery.   Electronically Signed   By: Marin Olp M.D.   On: 04/09/2014 17:03   04/09/2014   ADDENDUM REPORT: 04/09/2014 00:24  ADDENDUM: These results were called by telephone at the time of interpretation on 04/08/2014 at 11:50 p.m. to Dr. Grandville Silos, who verbally acknowledged these results.   Electronically Signed   By: Marin Olp M.D.   On: 04/09/2014 00:24   04/09/2014   CLINICAL DATA:  Intractable vomiting. Jejunostomy tube insertion 04/06/2014. History of esophageal cancer.  EXAM: CT ABDOMEN AND PELVIS WITH CONTRAST  TECHNIQUE: Multidetector CT imaging of the abdomen and pelvis was performed using the standard protocol following  bolus administration of intravenous contrast.  CONTRAST:  140mL OMNIPAQUE IOHEXOL 300 MG/ML  SOLN  COMPARISON:  None.  FINDINGS: Lung bases demonstrate minimal focal opacification over the right middle lobe adjacent the major fissure. There is circumferential wall thickening over the distal esophagus with fluid within the distal esophagus. This may be due to patient's known esophageal cancer.  Abdominal images demonstrate evidence of patient's jejunostomy tube entering the left abdomen above the umbilicus entering the jejunum and coursing towards the right upper quadrant within a jejunal loop. There is moderate free peritoneal air likely due to patient's recent jejunostomy tube placement. There is mild gastric distention. There is alteration of the normal position of the duodenal jejunal junction as the duodenum courses directly anteriorly to the jejunal junction as the proximal jejunum makes a sharp turn from the midline towards the  right abdomen as this is the point of entry of the jejunostomy tube. There is wall thickening of the jejunum at the level of the jejunostomy tube entry. There is mucosal enhancement with mild submucosal edema of the duodenum and jejunum just proximal to the entry of the jejunostomy tube. There are skin staples over the anterior midline abdominal wall.  Gallbladder is contracted. The liver, spleen, pancreas and adrenal glands are within normal. Kidneys are normal in size with a couple subcentimeter hypodensities too small to characterize but likely cysts. There is mild calcified plaque over the abdominal aorta. Appendix is not seen.  Pelvic images demonstrate the bladder, prostate and rectum early within normal. There is mild degenerative change of the spine and hips. The  IMPRESSION: Jejunostomy tube with tip overlying the jejunum in the right upper quadrant. Moderate free peritoneal air from patient's recent jejunostomy tube placement. Note that the jejunum is kinked on itself at the  entry of the jejunostomy tube just left of midline in the anterior mid to upper abdomen with minimal wall thickening. The duodenum and short segment of jejunum just proximal to the entry of the jejunostomy tube are mildly dilated with mucosal enhancement and submucosal edema along with mild gastric distention as these findings may be due to the degree of obstruction at the site of entry of the gastrostomy tube.  Subtle focal opacification over the right middle lobe which may represent atelectasis versus early infection.  Circumferential wall thickening with fluid within the distal esophagus which may be due to patient's known esophageal cancer.  A few small subcentimeter renal cortical hypodensities too small to characterize but likely cysts.  Electronically Signed: By: Marin Olp M.D. On: 04/08/2014 23:45   Dg Abd 2 Views  04/18/2014   CLINICAL DATA:  Nausea, vomiting  EXAM: ABDOMEN - 2 VIEW  COMPARISON:  04/12/2014  FINDINGS: Nasogastric tube coiled in the stomach. J-tube in satisfactory unchanged position. Oral contrast material is seen within the distal colon. There is no bowel dilatation to suggest obstruction. There is no evidence of pneumoperitoneum, portal venous gas or pneumatosis. There are no pathologic calcifications along the expected course of the ureters.The osseous structures are unremarkable.  IMPRESSION: Nasogastric tube and J-tube in satisfactory position.   Electronically Signed   By: Kathreen Devoid   On: 04/18/2014 13:38   Dg Abd 2 Views  04/08/2014   CLINICAL DATA:  Status post J-tube placement  EXAM: ABDOMEN - 2 VIEW  COMPARISON:  None.  FINDINGS: There is a percutaneous jejunostomy to which appears to anterior from the left of midline. This crosses the midline and terminates within the right upper quadrant of the abdomen. Skin staples are identified along the midline of the abdomen. The bowel gas pattern appears normal.  IMPRESSION: 1. Jejunostomy tube positioned as above.    Electronically Signed   By: Kerby Moors M.D.   On: 04/08/2014 09:40   Dg Intro Long Gi Tube  04/12/2014   CLINICAL DATA:  Such ill carcinoma. Recent surgical J-tube placement. Gastric distention, nausea, vomiting  EXAM: INTRO LONG GI TUBE  TECHNIQUE: Viscous lidocaine was applied to the right nasal passages. Under fluoroscopy, a 5 [place] the catheter was advanced from the right nares to the stomach. This was exchanged over an Amplatz wire for standard nasogastric tube, secured externally. Tip in the stomach.  CONTRAST:  None  FLUOROSCOPY TIME:  58 seconds  COMPARISON:  CT 04/08/2014  FINDINGS: Nasogastric tube placement to the stomach.  IMPRESSION: 1.  Technically successful nasogastric tube placement with fluoroscopic guidance.   Electronically Signed   By: Arne Cleveland M.D.   On: 04/12/2014 08:54   Dg Duanne Limerick W/water Sol Cm  04/20/2014   CLINICAL DATA:  Esophageal malignancy, high NG output, nausea opening G-tube is clamped  EXAM: WATER SOLUBLE UPPER GI SERIES  TECHNIQUE: Single-column upper GI series was performed using water soluble contrast.  CONTRAST:  52mL OMNIPAQUE IOHEXOL 300 MG/ML  SOLN  COMPARISON:  Abdominal series April 18, 2014  FLUOROSCOPY TIME:  0 min 52 seconds  FINDINGS: A nasogastric tube and jejunal tube are present. Omnipaque 300 was ingested orally initially. High-grade obstruction in the proximal thoracic esophagus was demonstrated with a small amount of contrast passing distally surrounding the nasogastric tube. The distal esophagus was unremarkable.  Subsequently 60 cc of contrast was instilled through the nasogastric tube. The gastric lumen distended reasonably well. Thickening of the mucosal fold pattern was noted. Gastric emptying into the duodenum was prompt. The duodenum appeared normal in caliber. The contrast passed through the duodenum into the proximal jejunum, surrounded the jejunal tube tip, and passed even more distally. The jejunal mucosal folds were normal.  There was no evidence of retained jejunal fluid.  IMPRESSION: 1. There is known high-grade obstruction in the proximal third of the esophagus. A small amount of barium did pass around the nasogastric tube. The visualized portions of the mid and distal esophagus were unremarkable. 2. The stomach, duodenum, and proximal jejunum did not appear abnormally dilated nor was are significant residual fluid present. The contrast administered through the nasogastric tube passed promptly from the stomach, through the duodenum, and into the proximal to mid jejunum.   Electronically Signed   By: David  Martinique   On: 04/20/2014 13:19   Dg Duanne Limerick W/water Sol Cm  04/11/2014   CLINICAL DATA:  Nausea and vomiting. High nasogastric tube output. Edema versus obstructing lesion in the duodenum, proximal to a percutaneous jejunostomy.  EXAM: WATER SOLUBLE UPPER GI SERIES  TECHNIQUE: Single-column upper GI series was performed using water soluble contrast.  : COMPARISON:  CT abdomen pelvis 04/08/2014.  FLUOROSCOPY TIME:  4 min 24 seconds.  FINDINGS: Scout view of the abdomen shows a nasogastric tube looped in the stomach. A percutaneous jejunostomy is seen in the right abdomen. Bowel gas pattern is unremarkable.  Omnipaque was hand-injected through the nasogastric tube. There is mild dilatation of the duodenum. Contrast slowly passed through the duodenum into the proximal jejunum when the patient was placed in the RPO and right lateral positions. Evaluation is somewhat limited with water-soluble contrast but there is no definite obstructing lesion. Contrast flowed along the course of the jejunostomy tube into the mid jejunum.  IMPRESSION: Mild dilatation of the duodenum with slow transit of contrast into the proximal jejunum, along the course of the jejunostomy tube, best seen when the patient was placed in the RPO and right lateral positions. Edema is favored over an obstructing lesion.   Electronically Signed   By: Lorin Picket M.D.    On: 04/11/2014 11:44    Microbiology: Recent Results (from the past 240 hour(s))  Culture, Urine     Status: None   Collection Time: 04/12/14  2:08 AM  Result Value Ref Range Status   Specimen Description URINE, CLEAN CATCH  Final   Special Requests none Normal  Final   Culture  Setup Time   Final    04/12/2014 11:21 Performed at [facility]  GROWTH Performed at [facility]   Final   Culture NO GROWTH Performed at [facility]   Final   Report Status 04/13/2014 FINAL  Final     Labs: Basic Metabolic Panel:  Recent Labs Lab 04/18/14 0535 04/18/14 0658 04/18/14 0949 04/19/14 0500 04/20/14 0500 04/21/14 0618  NA 134*  --  138 141 138 139  K 6.0*  --  4.9 4.3 4.5 3.8  CL 100  --  101 108 106 107  CO2 22  --  24 25 26 28   GLUCOSE 635* 140* 127* 149* 110* 140*  BUN 22  --  23 29* 35* 32*  CREATININE 1.14  --  1.17 1.04 1.15 1.07  CALCIUM 9.9  --  10.5 9.8 9.8 9.2  MG 2.7*  --  2.6* 2.3 2.3 2.2  PHOS 6.6*  --  3.7 3.6 4.4 3.6   Liver Function Tests:  Recent Labs Lab 04/18/14 0535 04/18/14 0949 04/19/14 0500 04/20/14 0500 04/21/14 0618  AST 72* 87* 70* 75* 62*  ALT 150* 183* 171* 188* 163*  ALKPHOS 120* 141* 129* 147* 126*  BILITOT 1.0 1.0 0.9 1.0 0.6  PROT 6.9 7.8 7.4 7.6 6.5  ALBUMIN 2.8* 3.3* 3.4* 3.4* 3.0*   No results for input(s): LIPASE, AMYLASE in the last 168 hours. No results for input(s): AMMONIA in the last 168 hours. CBC:  Recent Labs Lab 04/17/14 0545 04/18/14 0535 04/19/14 0500 04/20/14 0500  WBC 14.0* 12.8* 14.7* 17.3*  NEUTROABS  --  9.8*  --   --   HGB 14.6 14.3 14.9 14.8  HCT 43.8 45.1 45.0 46.2  MCV 87.3 90.7 89.5 89.4  PLT 312 395 369 419*   Cardiac Enzymes: No results for input(s): CKTOTAL, CKMB, CKMBINDEX, TROPONINI in the last 168 hours. BNP: BNP (last 3 results) No results for input(s): PROBNP in the last 8760 hours. CBG:  Recent Labs Lab 04/20/14 0532 04/20/14 1157  04/20/14 1816 04/21/14 0034 04/21/14 0558  GLUCAP 118* 125* 135* 152* 122*       Signed:  Bena Kobel  [facility] 04/21/2014, 11:30 AM

## 2014-04-24 ENCOUNTER — Other Ambulatory Visit: Payer: Self-pay | Admitting: Oncology

## 2014-04-25 ENCOUNTER — Ambulatory Visit: Payer: 59

## 2014-04-25 ENCOUNTER — Ambulatory Visit: Payer: 59 | Admitting: Radiation Oncology

## 2014-04-25 DIAGNOSIS — C159 Malignant neoplasm of esophagus, unspecified: Secondary | ICD-10-CM | POA: Diagnosis not present

## 2014-04-26 ENCOUNTER — Ambulatory Visit: Payer: 59

## 2014-04-27 ENCOUNTER — Telehealth: Payer: Self-pay | Admitting: Oncology

## 2014-04-27 ENCOUNTER — Encounter: Payer: Self-pay | Admitting: Radiation Oncology

## 2014-04-27 ENCOUNTER — Ambulatory Visit: Payer: 59

## 2014-04-27 ENCOUNTER — Ambulatory Visit (HOSPITAL_BASED_OUTPATIENT_CLINIC_OR_DEPARTMENT_OTHER): Payer: BC Managed Care – PPO | Admitting: Oncology

## 2014-04-27 ENCOUNTER — Telehealth: Payer: Self-pay | Admitting: *Deleted

## 2014-04-27 ENCOUNTER — Ambulatory Visit
Admission: RE | Admit: 2014-04-27 | Discharge: 2014-04-27 | Disposition: A | Discharge status: Home / Self Care | Payer: 59 | Source: Ambulatory Visit | Attending: Radiation Oncology | Admitting: Radiation Oncology

## 2014-04-27 ENCOUNTER — Other Ambulatory Visit (HOSPITAL_BASED_OUTPATIENT_CLINIC_OR_DEPARTMENT_OTHER): Payer: BC Managed Care – PPO

## 2014-04-27 ENCOUNTER — Ambulatory Visit: Payer: BC Managed Care – PPO

## 2014-04-27 ENCOUNTER — Ambulatory Visit (HOSPITAL_BASED_OUTPATIENT_CLINIC_OR_DEPARTMENT_OTHER): Payer: BC Managed Care – PPO

## 2014-04-27 ENCOUNTER — Telehealth: Payer: Self-pay | Admitting: Physician Assistant

## 2014-04-27 ENCOUNTER — Ambulatory Visit: Payer: BC Managed Care – PPO | Admitting: Nutrition

## 2014-04-27 ENCOUNTER — Other Ambulatory Visit: Payer: Self-pay | Admitting: *Deleted

## 2014-04-27 ENCOUNTER — Ambulatory Visit
Admission: RE | Admit: 2014-04-27 | Discharge: 2014-04-27 | Disposition: A | Discharge status: Home / Self Care | Payer: BC Managed Care – PPO | Source: Ambulatory Visit | Attending: Radiation Oncology | Admitting: Radiation Oncology

## 2014-04-27 VITALS — BP 110/79 | HR 80 | Temp 97.6°F | Resp 18 | Ht 71.0 in | Wt 175.7 lb

## 2014-04-27 DIAGNOSIS — Z5111 Encounter for antineoplastic chemotherapy: Secondary | ICD-10-CM

## 2014-04-27 DIAGNOSIS — Z95828 Presence of other vascular implants and grafts: Secondary | ICD-10-CM

## 2014-04-27 DIAGNOSIS — C159 Malignant neoplasm of esophagus, unspecified: Secondary | ICD-10-CM | POA: Diagnosis present

## 2014-04-27 LAB — COMPREHENSIVE METABOLIC PANEL (CC13)
ALT: 56 U/L — AB (ref 0–55)
AST: 25 U/L (ref 5–34)
Albumin: 2.9 g/dL — ABNORMAL LOW (ref 3.5–5.0)
Alkaline Phosphatase: 107 U/L (ref 40–150)
Anion Gap: 8 mEq/L (ref 3–11)
BUN: 16 mg/dL (ref 7.0–26.0)
CO2: 29 mEq/L (ref 22–29)
CREATININE: 0.9 mg/dL (ref 0.7–1.3)
Calcium: 9.2 mg/dL (ref 8.4–10.4)
Chloride: 98 mEq/L (ref 98–109)
EGFR: 89 mL/min/{1.73_m2} — ABNORMAL LOW (ref >90)
Glucose: 164 mg/dl — ABNORMAL HIGH (ref 70–140)
Potassium: 3.8 mEq/L (ref 3.5–5.1)
Sodium: 136 mEq/L (ref 136–145)
Total Bilirubin: 0.56 mg/dL (ref 0.20–1.20)
Total Protein: 6.4 g/dL (ref 6.4–8.3)

## 2014-04-27 LAB — CBC WITH DIFFERENTIAL/PLATELET
BASO%: 0 % (ref 0.0–2.0)
BASOS ABS: 0 10*3/uL (ref 0.0–0.1)
EOS ABS: 0 10*3/uL (ref 0.0–0.5)
EOS%: 0.2 % (ref 0.0–7.0)
HEMATOCRIT: 36.7 % — AB (ref 38.4–49.9)
HGB: 12.5 g/dL — ABNORMAL LOW (ref 13.0–17.1)
LYMPH#: 0.6 10*3/uL — AB (ref 0.9–3.3)
LYMPH%: 9 % — ABNORMAL LOW (ref 14.0–49.0)
MCH: 29 pg (ref 27.2–33.4)
MCHC: 34.1 g/dL (ref 32.0–36.0)
MCV: 85.2 fL (ref 79.3–98.0)
MONO#: 0.1 10*3/uL (ref 0.1–0.9)
MONO%: 1.7 % (ref 0.0–14.0)
NEUT%: 89.1 % — AB (ref 39.0–75.0)
NEUTROS ABS: 5.8 10*3/uL (ref 1.5–6.5)
Platelets: 393 10*3/uL (ref 140–400)
RBC: 4.31 10*6/uL (ref 4.20–5.82)
RDW: 12.5 % (ref 11.0–14.6)
WBC: 6.6 10*3/uL (ref 4.0–10.3)

## 2014-04-27 MED ORDER — DIPHENHYDRAMINE HCL 50 MG/ML IJ SOLN
50.0000 mg | Freq: Once | INTRAMUSCULAR | Status: AC
  Administered 2014-04-27: 50 mg via INTRAVENOUS

## 2014-04-27 MED ORDER — HEPARIN SOD (PORK) LOCK FLUSH 100 UNIT/ML IV SOLN
500.0000 [IU] | Freq: Once | INTRAVENOUS | Status: AC | PRN
  Administered 2014-04-27: 500 [IU]
  Filled 2014-04-27: qty 5

## 2014-04-27 MED ORDER — DIPHENHYDRAMINE HCL 50 MG/ML IJ SOLN
INTRAMUSCULAR | Status: AC
  Filled 2014-04-27: qty 1

## 2014-04-27 MED ORDER — RADIAPLEXRX EX GEL
Freq: Once | CUTANEOUS | Status: AC
  Administered 2014-04-27: 15:00:00 via TOPICAL

## 2014-04-27 MED ORDER — SODIUM CHLORIDE 0.9 % IV SOLN
Freq: Once | INTRAVENOUS | Status: AC
  Administered 2014-04-27: 10:00:00 via INTRAVENOUS

## 2014-04-27 MED ORDER — ONDANSETRON 16 MG/50ML IVPB (CHCC)
16.0000 mg | Freq: Once | INTRAVENOUS | Status: AC
  Administered 2014-04-27: 16 mg via INTRAVENOUS

## 2014-04-27 MED ORDER — SODIUM CHLORIDE 0.9 % IV SOLN
225.4000 mg | Freq: Once | INTRAVENOUS | Status: AC
  Administered 2014-04-27: 230 mg via INTRAVENOUS
  Filled 2014-04-27: qty 23

## 2014-04-27 MED ORDER — PACLITAXEL CHEMO INJECTION 300 MG/50ML
50.0000 mg/m2 | Freq: Once | INTRAVENOUS | Status: AC
  Administered 2014-04-27: 102 mg via INTRAVENOUS
  Filled 2014-04-27: qty 17

## 2014-04-27 MED ORDER — ONDANSETRON 16 MG/50ML IVPB (CHCC)
INTRAVENOUS | Status: AC
  Filled 2014-04-27: qty 16

## 2014-04-27 MED ORDER — FAMOTIDINE IN NACL 20-0.9 MG/50ML-% IV SOLN
INTRAVENOUS | Status: AC
  Filled 2014-04-27: qty 50

## 2014-04-27 MED ORDER — SODIUM CHLORIDE 0.9 % IJ SOLN
10.0000 mL | INTRAMUSCULAR | Status: DC | PRN
  Administered 2014-04-27: 10 mL via INTRAVENOUS
  Filled 2014-04-27: qty 10

## 2014-04-27 MED ORDER — SODIUM CHLORIDE 0.9 % IJ SOLN
10.0000 mL | INTRAMUSCULAR | Status: DC | PRN
  Administered 2014-04-27: 10 mL
  Filled 2014-04-27: qty 10

## 2014-04-27 MED ORDER — OXYCODONE HCL 5 MG/5ML PO SOLN: 5.0000 mg | ORAL | Status: DC | PRN

## 2014-04-27 MED ORDER — DEXAMETHASONE SODIUM PHOSPHATE 20 MG/5ML IJ SOLN
20.0000 mg | Freq: Once | INTRAMUSCULAR | Status: AC
  Administered 2014-04-27: 20 mg via INTRAVENOUS

## 2014-04-27 MED ORDER — DEXAMETHASONE SODIUM PHOSPHATE 20 MG/5ML IJ SOLN
INTRAMUSCULAR | Status: AC
Start: 2014-04-27 — End: 2014-04-27
  Filled 2014-04-27: qty 5

## 2014-04-27 MED ORDER — FAMOTIDINE IN NACL 20-0.9 MG/50ML-% IV SOLN
20.0000 mg | Freq: Once | INTRAVENOUS | Status: AC
  Administered 2014-04-27: 20 mg via INTRAVENOUS

## 2014-04-27 NOTE — Telephone Encounter (Signed)
Pt confirmed labs/ov per 12/30 POF, gave pt AVS.... KJ, sent msg to add chemo

## 2014-04-27 NOTE — Addendum Note (Signed)
Encounter addended by: Rebecca Eaton, RN on: 04/27/2014  2:52 PM<BR>     Documentation filed: Inpatient MAR

## 2014-04-27 NOTE — Patient Instructions (Signed)
PICC Home Guide A peripherally inserted central catheter (PICC) is a long, thin, flexible tube that is inserted into a vein in the upper arm. It is a form of intravenous (IV) access. It is considered to be a "central" line because the tip of the PICC ends in a large vein in your chest. This large vein is called the superior vena cava (SVC). The PICC tip ends in the SVC because there is a lot of blood flow in the SVC. This allows medicines and IV fluids to be quickly distributed throughout the body. The PICC is inserted using a sterile technique by a specially trained nurse or physician. After the PICC is inserted, a chest X-ray exam is done to be sure it is in the correct place.  A PICC may be placed for different reasons, such as:  To give medicines and liquid nutrition that can only be given through a central line. Examples are:  Certain antibiotic treatments.  Chemotherapy.  Total parenteral nutrition (TPN).  To take frequent blood samples.  To give IV fluids and blood products.  If there is difficulty placing a peripheral intravenous (PIV) catheter. If taken care of properly, a PICC can remain in place for several months. A PICC can also allow a person to go home from the hospital early. Medicine and PICC care can be managed at home by a family member or home health care team. WHAT PROBLEMS CAN HAPPEN WHEN I HAVE A PICC? Problems with a PICC can occasionally occur. These may include the following:  A blood clot (thrombus) forming in or at the tip of the PICC. This can cause the PICC to become clogged. A clot-dissolving medicine called tissue plasminogen activator (tPA) can be given through the PICC to help break up the clot.  Inflammation of the vein (phlebitis) in which the PICC is placed. Signs of inflammation may include redness, pain at the insertion site, red streaks, or being able to feel a "cord" in the vein where the PICC is located.  Infection in the PICC or at the insertion  site. Signs of infection may include fever, chills, redness, swelling, or pus drainage from the PICC insertion site.  PICC movement (malposition). The PICC tip may move from its original position due to excessive physical activity, forceful coughing, sneezing, or vomiting.  A break or cut in the PICC. It is important to not use scissors near the PICC.  Nerve or tendon irritation or injury during PICC insertion. WHAT SHOULD I KEEP IN MIND ABOUT ACTIVITIES WHEN I HAVE A PICC?  You may bend your arm and move it freely. If your PICC is near or at the bend of your elbow, avoid activity with repeated motion at the elbow.  Rest at home for the remainder of the day following PICC line insertion.  Avoid lifting heavy objects as instructed by your health care provider.  Avoid using a crutch with the arm on the same side as your PICC. You may need to use a walker. WHAT SHOULD I KNOW ABOUT MY PICC DRESSING?  Keep your PICC bandage (dressing) clean and dry to prevent infection.  Ask your health care provider when you may shower. Ask your health care provider to teach you how to wrap the PICC when you do take a shower.  Change the PICC dressing as instructed by your health care provider.  Change your PICC dressing if it becomes loose or wet. WHAT SHOULD I KNOW ABOUT PICC CARE?  Check the PICC insertion site   daily for leakage, redness, swelling, or pain.  Do not take a bath, swim, or use hot tubs when you have a PICC. Cover PICC line with clear plastic wrap and tape to keep it dry while showering.  Flush the PICC as directed by your health care provider. Let your health care provider know right away if the PICC is difficult to flush or does not flush. Do not use force to flush the PICC.  Do not use a syringe that is less than 10 mL to flush the PICC.  Never pull or tug on the PICC.  Avoid blood pressure checks on the arm with the PICC.  Keep your PICC identification card with you at all  times.  Do not take the PICC out yourself. Only a trained clinical professional should remove the PICC. SEEK IMMEDIATE MEDICAL CARE IF:  Your PICC is accidentally pulled all the way out. If this happens, cover the insertion site with a bandage or gauze dressing. Do not throw the PICC away. Your health care provider will need to inspect it.  Your PICC was tugged or pulled and has partially come out. Do not  push the PICC back in.  There is any type of drainage, redness, or swelling where the PICC enters the skin.  You cannot flush the PICC, it is difficult to flush, or the PICC leaks around the insertion site when it is flushed.  You hear a "flushing" sound when the PICC is flushed.  You have pain, discomfort, or numbness in your arm, shoulder, or jaw on the same side as the PICC.  You feel your heart "racing" or skipping beats.  You notice a hole or tear in the PICC.  You develop chills or a fever. MAKE SURE YOU:   Understand these instructions.  Will watch your condition.  Will get help right away if you are not doing well or get worse. Document Released: 10/20/2002 Document Revised: 08/30/2013 Document Reviewed: 12/21/2012 [facility] Patient Information 2015 [place], [place]. This information is not intended to replace advice given to you by your health care provider. Make sure you discuss any questions you have with your health care provider.

## 2014-04-27 NOTE — Progress Notes (Signed)
Patient wasn't sent to nursing for MD to see and for pt education, have rad iation book, radiaplex gel, scheduled calendar for pt with my business card as well, called and spoke with RT therapist,"forgot' stated Will have on call MD see tomorrow and will give radiaplex skin gel and book to pateint 2:50 PM

## 2014-04-27 NOTE — Patient Instructions (Signed)
[facility] Discharge Instructions for Patients Receiving Chemotherapy  Today you received the following chemotherapy agents: Paclitaxel/Carboplatin.  To help prevent nausea and vomiting after your treatment, we encourage you to take your nausea medication as directed.    If you develop nausea and vomiting that is not controlled by your nausea medication, call the clinic.   BELOW ARE SYMPTOMS THAT SHOULD BE REPORTED IMMEDIATELY:  *FEVER GREATER THAN 100.5 F  *CHILLS WITH OR WITHOUT FEVER  NAUSEA AND VOMITING THAT IS NOT CONTROLLED WITH YOUR NAUSEA MEDICATION  *UNUSUAL SHORTNESS OF BREATH  *UNUSUAL BRUISING OR BLEEDING  TENDERNESS IN MOUTH AND THROAT WITH OR WITHOUT PRESENCE OF ULCERS  *URINARY PROBLEMS  *BOWEL PROBLEMS  UNUSUAL RASH Items with * indicate a potential emergency and should be followed up as soon as possible.  Feel free to call the clinic you have any questions or concerns. The clinic phone number is [phone].  Paclitaxel injection What is this medicine? PACLITAXEL (PAK li TAX el) is a chemotherapy drug. It targets fast dividing cells, like cancer cells, and causes these cells to die. This medicine is used to treat ovarian cancer, breast cancer, and other cancers. This medicine may be used for other purposes; ask your health care provider or pharmacist if you have questions. COMMON BRAND NAME(S): Onxol, Taxol What should I tell my health care provider before I take this medicine? They need to know if you have any of these conditions: -blood disorders -irregular heartbeat -infection (especially a virus infection such as chickenpox, cold sores, or herpes) -liver disease -previous or ongoing radiation therapy -an unusual or allergic reaction to paclitaxel, alcohol, polyoxyethylated castor oil, other chemotherapy agents, other medicines, foods, dyes, or preservatives -pregnant or trying to get pregnant -breast-feeding How should I use this  medicine? This drug is given as an infusion into a vein. It is administered in a hospital or clinic by a specially trained health care professional. Talk to your pediatrician regarding the use of this medicine in children. Special care may be needed. Overdosage: If you think you have taken too much of this medicine contact a poison control center or emergency room at once. NOTE: This medicine is only for you. Do not share this medicine with others. What if I miss a dose? It is important not to miss your dose. Call your doctor or health care professional if you are unable to keep an appointment. What may interact with this medicine? Do not take this medicine with any of the following medications: -disulfiram -metronidazole This medicine may also interact with the following medications: -cyclosporine -diazepam -ketoconazole -medicines to increase blood counts like filgrastim, pegfilgrastim, sargramostim -other chemotherapy drugs like cisplatin, doxorubicin, epirubicin, etoposide, teniposide, vincristine -quinidine -testosterone -vaccines -verapamil Talk to your doctor or health care professional before taking any of these medicines: -acetaminophen -aspirin -ibuprofen -ketoprofen -naproxen This list may not describe all possible interactions. Give your health care provider a list of all the medicines, herbs, non-prescription drugs, or dietary supplements you use. Also tell them if you smoke, drink alcohol, or use illegal drugs. Some items may interact with your medicine. What should I watch for while using this medicine? Your condition will be monitored carefully while you are receiving this medicine. You will need important blood work done while you are taking this medicine. This drug may make you feel generally unwell. This is not uncommon, as chemotherapy can affect healthy cells as well as cancer cells. Report any side effects. Continue your course of treatment  even though you feel ill  unless your doctor tells you to stop. In some cases, you may be given additional medicines to help with side effects. Follow all directions for their use. Call your doctor or health care professional for advice if you get a fever, chills or sore throat, or other symptoms of a cold or flu. Do not treat yourself. This drug decreases your body's ability to fight infections. Try to avoid being around people who are sick. This medicine may increase your risk to bruise or bleed. Call your doctor or health care professional if you notice any unusual bleeding. Be careful brushing and flossing your teeth or using a toothpick because you may get an infection or bleed more easily. If you have any dental work done, tell your dentist you are receiving this medicine. Avoid taking products that contain aspirin, acetaminophen, ibuprofen, naproxen, or ketoprofen unless instructed by your doctor. These medicines may hide a fever. Do not become pregnant while taking this medicine. Women should inform their doctor if they wish to become pregnant or think they might be pregnant. There is a potential for serious side effects to an unborn child. Talk to your health care professional or pharmacist for more information. Do not breast-feed an infant while taking this medicine. Men are advised not to father a child while receiving this medicine. What side effects may I notice from receiving this medicine? Side effects that you should report to your doctor or health care professional as soon as possible: -allergic reactions like skin rash, itching or hives, swelling of the face, lips, or tongue -low blood counts - This drug may decrease the number of white blood cells, red blood cells and platelets. You may be at increased risk for infections and bleeding. -signs of infection - fever or chills, cough, sore throat, pain or difficulty passing urine -signs of decreased platelets or bleeding - bruising, pinpoint red spots on the skin,  black, tarry stools, nosebleeds -signs of decreased red blood cells - unusually weak or tired, fainting spells, lightheadedness -breathing problems -chest pain -high or low blood pressure -mouth sores -nausea and vomiting -pain, swelling, redness or irritation at the injection site -pain, tingling, numbness in the hands or feet -slow or irregular heartbeat -swelling of the ankle, feet, hands Side effects that usually do not require medical attention (report to your doctor or health care professional if they continue or are bothersome): -bone pain -complete hair loss including hair on your head, underarms, pubic hair, eyebrows, and eyelashes -changes in the color of fingernails -diarrhea -loosening of the fingernails -loss of appetite -muscle or joint pain -red flush to skin -sweating This list may not describe all possible side effects. Call your doctor for medical advice about side effects. You may report side effects to FDA at 1-800-FDA-1088. Where should I keep my medicine? This drug is given in a hospital or clinic and will not be stored at home. NOTE: This sheet is a summary. It may not cover all possible information. If you have questions about this medicine, talk to your doctor, pharmacist, or health care provider.  2015, [employer] Standard. (2012-06-08 16:41:21)  Carboplatin injection What is this medicine? CARBOPLATIN (KAR boe pla tin) is a chemotherapy drug. It targets fast dividing cells, like cancer cells, and causes these cells to die. This medicine is used to treat ovarian cancer and many other cancers. This medicine may be used for other purposes; ask your health care provider or pharmacist if you have questions. COMMON BRAND  NAME(S): Paraplatin What should I tell my health care provider before I take this medicine? They need to know if you have any of these conditions: -blood disorders -hearing problems -kidney disease -recent or ongoing radiation  therapy -an unusual or allergic reaction to carboplatin, cisplatin, other chemotherapy, other medicines, foods, dyes, or preservatives -pregnant or trying to get pregnant -breast-feeding How should I use this medicine? This drug is usually given as an infusion into a vein. It is administered in a hospital or clinic by a specially trained health care professional. Talk to your pediatrician regarding the use of this medicine in children. Special care may be needed. Overdosage: If you think you have taken too much of this medicine contact a poison control center or emergency room at once. NOTE: This medicine is only for you. Do not share this medicine with others. What if I miss a dose? It is important not to miss a dose. Call your doctor or health care professional if you are unable to keep an appointment. What may interact with this medicine? -medicines for seizures -medicines to increase blood counts like filgrastim, pegfilgrastim, sargramostim -some antibiotics like amikacin, gentamicin, neomycin, streptomycin, tobramycin -vaccines Talk to your doctor or health care professional before taking any of these medicines: -acetaminophen -aspirin -ibuprofen -ketoprofen -naproxen This list may not describe all possible interactions. Give your health care provider a list of all the medicines, herbs, non-prescription drugs, or dietary supplements you use. Also tell them if you smoke, drink alcohol, or use illegal drugs. Some items may interact with your medicine. What should I watch for while using this medicine? Your condition will be monitored carefully while you are receiving this medicine. You will need important blood work done while you are taking this medicine. This drug may make you feel generally unwell. This is not uncommon, as chemotherapy can affect healthy cells as well as cancer cells. Report any side effects. Continue your course of treatment even though you feel ill unless your doctor  tells you to stop. In some cases, you may be given additional medicines to help with side effects. Follow all directions for their use. Call your doctor or health care professional for advice if you get a fever, chills or sore throat, or other symptoms of a cold or flu. Do not treat yourself. This drug decreases your body's ability to fight infections. Try to avoid being around people who are sick. This medicine may increase your risk to bruise or bleed. Call your doctor or health care professional if you notice any unusual bleeding. Be careful brushing and flossing your teeth or using a toothpick because you may get an infection or bleed more easily. If you have any dental work done, tell your dentist you are receiving this medicine. Avoid taking products that contain aspirin, acetaminophen, ibuprofen, naproxen, or ketoprofen unless instructed by your doctor. These medicines may hide a fever. Do not become pregnant while taking this medicine. Women should inform their doctor if they wish to become pregnant or think they might be pregnant. There is a potential for serious side effects to an unborn child. Talk to your health care professional or pharmacist for more information. Do not breast-feed an infant while taking this medicine. What side effects may I notice from receiving this medicine? Side effects that you should report to your doctor or health care professional as soon as possible: -allergic reactions like skin rash, itching or hives, swelling of the face, lips, or tongue -signs of infection - fever or  chills, cough, sore throat, pain or difficulty passing urine -signs of decreased platelets or bleeding - bruising, pinpoint red spots on the skin, black, tarry stools, nosebleeds -signs of decreased red blood cells - unusually weak or tired, fainting spells, lightheadedness -breathing problems -changes in hearing -changes in vision -chest pain -high blood pressure -low blood counts - This  drug may decrease the number of white blood cells, red blood cells and platelets. You may be at increased risk for infections and bleeding. -nausea and vomiting -pain, swelling, redness or irritation at the injection site -pain, tingling, numbness in the hands or feet -problems with balance, talking, walking -trouble passing urine or change in the amount of urine Side effects that usually do not require medical attention (report to your doctor or health care professional if they continue or are bothersome): -hair loss -loss of appetite -metallic taste in the mouth or changes in taste This list may not describe all possible side effects. Call your doctor for medical advice about side effects. You may report side effects to FDA at 1-800-FDA-1088. Where should I keep my medicine? This drug is given in a hospital or clinic and will not be stored at home. NOTE: This sheet is a summary. It may not cover all possible information. If you have questions about this medicine, talk to your doctor, pharmacist, or health care provider.  2015, [employer] Standard. (2007-07-21 14:38:05)

## 2014-04-27 NOTE — Telephone Encounter (Signed)
Per staff message and POF I have scheduled appts. Advised scheduler of appts. JMW  

## 2014-04-27 NOTE — Progress Notes (Signed)
  Billy Romero OFFICE PROGRESS NOTE   Diagnosis: Esophagus cancer  INTERVAL HISTORY:   He was admitted 04/05/2014 with complete solid/liquid dysphasia. He underwent placement of a feeding jejunostomy tube 04/07/2014. He developed a postoperative small bowel obstruction that required NG tube placement. He was treated with TNA.  The bowel obstruction resolved over the last few days of the hospital admission. He was discharged home 04/21/2014 and is now tolerating jejunostomy tube feedings. He is tolerating liquids by mouth. He is having bowel movements. Billy Romero feels well and is scheduled to begin chemotherapy and radiation today. He took home Decadron last night and this morning.  Objective:  Vital signs in last 24 hours:  Blood pressure 110/79, pulse 80, temperature 97.6 F (36.4 C), temperature source Oral, resp. rate 18, height 5\' 11"  (1.803 m), weight 175 lb 11.2 oz (79.697 kg), SpO2 99 %.   Resp: Bronchial sounds at the upper posterior chest bilaterally, no respiratory distress Cardio: Regular rate and rhythm GI: Soft, nontender, healed midline surgical incision, left upper quadrant jejunostomy feeding tube site with mild erythema at the skin exit Vascular: No leg edema   Portacath/PICC-without erythema  Lab Results:  Lab Results  Component Value Date   WBC 6.6 04/27/2014   HGB 12.5* 04/27/2014   HCT 36.7* 04/27/2014   MCV 85.2 04/27/2014   PLT 393 04/27/2014   NEUTROABS 5.8 04/27/2014   Potassium 3.8, creatinine 0.9, AST 25, ALT 56, bilirubin 0.56  Medications: I have reviewed the patient's current medications.  Assessment/Plan: 1. 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 Solid/liquid dysphagia secondary to #1  Jejunostomy feeding tube placement 04/07/2014  3. Small bowel obstruction following  placement of the jejunostomy feeding tube-resolved  4. History of ulcerative colitis  5. Aspiration pneumonia December 2015   Disposition:  Billy Romero appears well. He will continue jejunostomy tube feedings. The plan is to begin weekly Taxol/carboplatin and concurrent radiation today. He will return for an office visit in one week.  Betsy Coder, MD  04/27/2014  1:10 PM

## 2014-04-27 NOTE — Progress Notes (Signed)
Nutrition follow-up completed with patient and wife, during chemotherapy. Patient status post hospitalization secondary to obstruction from esophageal cancer.  Patient is status post J-tube placement on December 9. Weight has decreased and was documented as 175.2 pounds December 24, down from 186.3 pounds on December 7 and usual body weight of 210 pounds. Patient is drinking Gatorade, broth, and water by mouth. Patient is tolerating Osmolite 1.5 at 100 mL an hour for approximately 12 hours daily with 30 mL Protostat twice a day and 60 cc free water before and after continuous feedings. Patient also receiving additional 875 mL free water. Tube feeding and protein supplement at goal of 110 ml/hr will provide 2180 cal, 113 g protein, and 2000 mL free water.  Nutrition diagnosis: Unintended weight loss continues.  Estimated nutrition needs: 2385-2623 calories, 120-135 grams protein, 2.5 L fluid.  Intervention: Patient will increase continuous tube feedings to 110 mL an hour for 12 hours daily. Patient will continue protein supplement and free water flushes. Patient will work to increase oral intake to provide remaining calories and protein needs. If patient is unable to increase oral intake, will increase continuous tube feedings. Patient is an agreement with plan. Questions were answered.  Teach back method used.   Monitoring, evaluation, goals: Patient will work to tolerate tube feeding plus oral intake to meet minimum estimated nutrition needs to minimize weight loss.  Next visit: To be scheduled weekly.  **Disclaimer: This note was dictated with voice recognition software. Similar sounding words can inadvertently be transcribed and this note may contain transcription errors which may not have been corrected upon publication of note.**

## 2014-04-27 NOTE — Telephone Encounter (Signed)
Called linac#2, asked  If Billy Romero was coming around to see MD, per Faith stated"He left, forgot", was going to post sim patient, will inform Dr.Manning 2:45 PM

## 2014-04-28 ENCOUNTER — Ambulatory Visit
Admission: RE | Admit: 2014-04-28 | Discharge: 2014-04-28 | Disposition: A | Discharge status: Home / Self Care | Payer: BC Managed Care – PPO | Source: Ambulatory Visit | Attending: Radiation Oncology | Admitting: Radiation Oncology

## 2014-04-28 ENCOUNTER — Encounter: Payer: Self-pay | Admitting: Radiation Oncology

## 2014-04-28 ENCOUNTER — Ambulatory Visit
Admission: RE | Admit: 2014-04-28 | Discharge: 2014-04-28 | Disposition: A | Discharge status: Home / Self Care | Payer: 59 | Source: Ambulatory Visit | Attending: Radiation Oncology | Admitting: Radiation Oncology

## 2014-04-28 ENCOUNTER — Ambulatory Visit: Payer: 59

## 2014-04-28 ENCOUNTER — Telehealth: Payer: Self-pay | Admitting: *Deleted

## 2014-04-28 VITALS — BP 111/61 | HR 64 | Temp 97.6°F | Resp 20 | Ht 71.0 in | Wt 176.8 lb

## 2014-04-28 DIAGNOSIS — C159 Malignant neoplasm of esophagus, unspecified: Secondary | ICD-10-CM

## 2014-04-28 NOTE — Progress Notes (Signed)
Weekly Management Note:  Site: Esophagus Current Dose:  900  cGy Projected Dose: 5400  cGy  Narrative: The patient is seen today for routine under treatment assessment. CBCT/MVCT images/port films were reviewed. The chart was reviewed.   Billy Romero is generally doing well.  He does report a penile skin reaction and describes his genitalia remaining moist during his hospitalization from urinary leakage.  He states that his swallowing is slightly improved and he is able to drink liquids without much difficulty.  He relies on his PEG feeding tube for his nutrition.  Physical Examination:  Filed Vitals:   04/28/14 1048  BP: 111/61  Pulse: 64  Temp: 97.6 F (36.4 C)  Resp: 20  .  Weight: 176 lb 12.8 oz (80.196 kg).  On inspection of genitalia there is a confluent moist erythematous reaction along the ventral aspect of his penis.  I suspect that this is some type of fungal infection.  Impression: Tolerating radiation therapy well.  However, he appears to have a fungal infection along the ventral aspect of his penis.  We'll get him started on miconazole powder to use at least 3 times a day.  We will need to see how is doing when he returns for therapy this coming Monday.  Plan: Continue radiation therapy as planned.

## 2014-04-28 NOTE — Progress Notes (Signed)
Weekly radiation tx esophagus and abdomen, 2/28 complettd, patient education done, radiaplex gel given, radiation book, my business card given, discussed s/e, n,v,d, skin irritation, throat changes, difficulty swallowing, ,fatigue,pain, patient hasd a g-tube, intact,slight drainage,cleansed and placed dressing, ,using osmolite 1.5 feedings 8-10 hours a day, breaks it up in am and pm, no nausea  At present, can swallow soups well and water,gatoraid, back pain lower lumbar,  Has RUA power Picc dopuble, intact, mesh dressing, fatigue better and swallowing better stated 10:56 AM

## 2014-04-28 NOTE — Progress Notes (Signed)
Addendum, pateint informed me that his openis and groin area was raw,and rash, since d/c hospital, notified mD  Who gave instructions to use miconozole powder 3x day, if not better by Monday to nursing 11:04 AM

## 2014-04-28 NOTE — Telephone Encounter (Signed)
Left VM for patient to call back with status report and ask for triage.

## 2014-04-29 DIAGNOSIS — C159 Malignant neoplasm of esophagus, unspecified: Secondary | ICD-10-CM | POA: Diagnosis present

## 2014-05-01 ENCOUNTER — Other Ambulatory Visit: Payer: Self-pay | Admitting: Oncology

## 2014-05-02 ENCOUNTER — Ambulatory Visit
Admission: RE | Admit: 2014-05-02 | Discharge: 2014-05-02 | Disposition: A | Discharge status: Home / Self Care | Payer: 59 | Source: Ambulatory Visit | Attending: Radiation Oncology | Admitting: Radiation Oncology

## 2014-05-02 ENCOUNTER — Ambulatory Visit: Payer: 59

## 2014-05-02 ENCOUNTER — Telehealth: Payer: Self-pay | Admitting: *Deleted

## 2014-05-02 DIAGNOSIS — C159 Malignant neoplasm of esophagus, unspecified: Secondary | ICD-10-CM | POA: Diagnosis not present

## 2014-05-02 NOTE — Telephone Encounter (Signed)
Wife called to inquire if he needs refill on dexamethasone to take night before and am of next treatment. Made her aware that this is not necessary since he did well with the first treatment. He will still receive a dose of IV dexamethasone when here.

## 2014-05-03 ENCOUNTER — Encounter (HOSPITAL_COMMUNITY): Payer: Self-pay

## 2014-05-03 ENCOUNTER — Ambulatory Visit: Payer: 59

## 2014-05-03 ENCOUNTER — Ambulatory Visit (HOSPITAL_COMMUNITY)
Admit: 2014-05-03 | Discharge: 2014-05-03 | Disposition: A | Discharge status: Home / Self Care | Payer: 59 | Source: Ambulatory Visit | Attending: Oncology | Admitting: Oncology

## 2014-05-03 ENCOUNTER — Ambulatory Visit
Admission: RE | Admit: 2014-05-03 | Discharge: 2014-05-03 | Disposition: A | Discharge status: Home / Self Care | Payer: 59 | Source: Ambulatory Visit | Attending: Radiation Oncology | Admitting: Radiation Oncology

## 2014-05-03 DIAGNOSIS — C159 Malignant neoplasm of esophagus, unspecified: Secondary | ICD-10-CM | POA: Insufficient documentation

## 2014-05-03 LAB — GLUCOSE, CAPILLARY: Glucose-Capillary: 86 mg/dL (ref 70–99)

## 2014-05-03 MED ORDER — FLUDEOXYGLUCOSE F - 18 (FDG) INJECTION
10.0000 | Freq: Once | INTRAVENOUS | Status: AC | PRN
  Administered 2014-05-03: 10 via INTRAVENOUS

## 2014-05-04 ENCOUNTER — Ambulatory Visit (HOSPITAL_BASED_OUTPATIENT_CLINIC_OR_DEPARTMENT_OTHER): Payer: 59

## 2014-05-04 ENCOUNTER — Other Ambulatory Visit (HOSPITAL_BASED_OUTPATIENT_CLINIC_OR_DEPARTMENT_OTHER): Payer: 59

## 2014-05-04 ENCOUNTER — Ambulatory Visit: Payer: 59

## 2014-05-04 ENCOUNTER — Ambulatory Visit (HOSPITAL_BASED_OUTPATIENT_CLINIC_OR_DEPARTMENT_OTHER): Payer: 59 | Admitting: Oncology

## 2014-05-04 ENCOUNTER — Telehealth: Payer: Self-pay | Admitting: Oncology

## 2014-05-04 ENCOUNTER — Ambulatory Visit
Admission: RE | Admit: 2014-05-04 | Discharge: 2014-05-04 | Disposition: A | Discharge status: Home / Self Care | Payer: 59 | Source: Ambulatory Visit | Attending: Radiation Oncology | Admitting: Radiation Oncology

## 2014-05-04 VITALS — BP 110/52 | HR 120 | Temp 97.5°F | Resp 18 | Ht 71.0 in | Wt 171.4 lb

## 2014-05-04 DIAGNOSIS — C159 Malignant neoplasm of esophagus, unspecified: Secondary | ICD-10-CM

## 2014-05-04 DIAGNOSIS — Z5111 Encounter for antineoplastic chemotherapy: Secondary | ICD-10-CM

## 2014-05-04 DIAGNOSIS — R42 Dizziness and giddiness: Secondary | ICD-10-CM

## 2014-05-04 DIAGNOSIS — Z452 Encounter for adjustment and management of vascular access device: Secondary | ICD-10-CM

## 2014-05-04 DIAGNOSIS — R1310 Dysphagia, unspecified: Secondary | ICD-10-CM

## 2014-05-04 DIAGNOSIS — K5669 Other intestinal obstruction: Secondary | ICD-10-CM

## 2014-05-04 DIAGNOSIS — R599 Enlarged lymph nodes, unspecified: Secondary | ICD-10-CM

## 2014-05-04 LAB — CBC WITH DIFFERENTIAL/PLATELET
BASO%: 0.5 % (ref 0.0–2.0)
Basophils Absolute: 0 10*3/uL (ref 0.0–0.1)
EOS ABS: 0.1 10*3/uL (ref 0.0–0.5)
EOS%: 1.9 % (ref 0.0–7.0)
HEMATOCRIT: 38.1 % — AB (ref 38.4–49.9)
HGB: 12.2 g/dL — ABNORMAL LOW (ref 13.0–17.1)
LYMPH%: 14.1 % (ref 14.0–49.0)
MCH: 28 pg (ref 27.2–33.4)
MCHC: 32.1 g/dL (ref 32.0–36.0)
MCV: 87 fL (ref 79.3–98.0)
MONO#: 0.6 10*3/uL (ref 0.1–0.9)
MONO%: 7.5 % (ref 0.0–14.0)
NEUT#: 5.8 10*3/uL (ref 1.5–6.5)
NEUT%: 76 % — ABNORMAL HIGH (ref 39.0–75.0)
Platelets: 245 10*3/uL (ref 140–400)
RBC: 4.38 10*6/uL (ref 4.20–5.82)
RDW: 12.6 % (ref 11.0–14.6)
WBC: 7.6 10*3/uL (ref 4.0–10.3)
lymph#: 1.1 10*3/uL (ref 0.9–3.3)

## 2014-05-04 MED ORDER — DEXTROSE 5 % IV SOLN
50.0000 mg/m2 | Freq: Once | INTRAVENOUS | Status: AC
  Administered 2014-05-04: 102 mg via INTRAVENOUS
  Filled 2014-05-04: qty 17

## 2014-05-04 MED ORDER — DIPHENHYDRAMINE HCL 50 MG/ML IJ SOLN
25.0000 mg | Freq: Once | INTRAMUSCULAR | Status: AC
  Administered 2014-05-04: 25 mg via INTRAVENOUS

## 2014-05-04 MED ORDER — DIPHENHYDRAMINE HCL 50 MG/ML IJ SOLN
INTRAMUSCULAR | Status: AC
  Filled 2014-05-04: qty 1

## 2014-05-04 MED ORDER — OXYCODONE HCL 5 MG/5ML PO SOLN: 5.0000 mg | ORAL | Status: DC | PRN

## 2014-05-04 MED ORDER — HEPARIN SOD (PORK) LOCK FLUSH 100 UNIT/ML IV SOLN
500.0000 [IU] | Freq: Once | INTRAVENOUS | Status: AC
  Administered 2014-05-04: 500 [IU] via INTRAVENOUS
  Filled 2014-05-04: qty 5

## 2014-05-04 MED ORDER — SODIUM CHLORIDE 0.9 % IV SOLN
Freq: Once | INTRAVENOUS | Status: AC
  Administered 2014-05-04: 13:00:00 via INTRAVENOUS

## 2014-05-04 MED ORDER — ONDANSETRON 16 MG/50ML IVPB (CHCC)
16.0000 mg | Freq: Once | INTRAVENOUS | Status: AC
  Administered 2014-05-04: 16 mg via INTRAVENOUS

## 2014-05-04 MED ORDER — SODIUM CHLORIDE 0.9 % IJ SOLN
10.0000 mL | INTRAMUSCULAR | Status: DC | PRN
  Administered 2014-05-04: 10 mL
  Filled 2014-05-04: qty 10

## 2014-05-04 MED ORDER — DEXAMETHASONE SODIUM PHOSPHATE 10 MG/ML IJ SOLN
INTRAMUSCULAR | Status: AC
  Filled 2014-05-04: qty 1

## 2014-05-04 MED ORDER — FAMOTIDINE IN NACL 20-0.9 MG/50ML-% IV SOLN
20.0000 mg | Freq: Once | INTRAVENOUS | Status: AC
  Administered 2014-05-04: 20 mg via INTRAVENOUS

## 2014-05-04 MED ORDER — DEXAMETHASONE SODIUM PHOSPHATE 10 MG/ML IJ SOLN
10.0000 mg | Freq: Once | INTRAMUSCULAR | Status: AC
  Administered 2014-05-04: 10 mg via INTRAVENOUS

## 2014-05-04 MED ORDER — SODIUM CHLORIDE 0.9 % IJ SOLN
10.0000 mL | INTRAMUSCULAR | Status: DC | PRN
  Administered 2014-05-04: 10 mL via INTRAVENOUS
  Filled 2014-05-04: qty 10

## 2014-05-04 MED ORDER — SODIUM CHLORIDE 0.9 % IV SOLN
225.4000 mg | Freq: Once | INTRAVENOUS | Status: AC
  Administered 2014-05-04: 230 mg via INTRAVENOUS
  Filled 2014-05-04: qty 23

## 2014-05-04 MED ORDER — ONDANSETRON 16 MG/50ML IVPB (CHCC)
INTRAVENOUS | Status: AC
  Filled 2014-05-04: qty 16

## 2014-05-04 MED ORDER — FAMOTIDINE IN NACL 20-0.9 MG/50ML-% IV SOLN
INTRAVENOUS | Status: AC
  Filled 2014-05-04: qty 50

## 2014-05-04 MED ORDER — HEPARIN SOD (PORK) LOCK FLUSH 100 UNIT/ML IV SOLN
500.0000 [IU] | Freq: Once | INTRAVENOUS | Status: AC | PRN
  Administered 2014-05-04: 500 [IU]
  Filled 2014-05-04: qty 5

## 2014-05-04 MED ORDER — SODIUM CHLORIDE 0.9 % IV SOLN
Freq: Once | INTRAVENOUS | Status: AC
  Administered 2014-05-04: 11:00:00 via INTRAVENOUS

## 2014-05-04 NOTE — Patient Instructions (Signed)
[facility] Discharge Instructions for Patients Receiving Chemotherapy  Today you received the following chemotherapy agents Taxol/Carboplatin  To help prevent nausea and vomiting after your treatment, we encourage you to take your nausea medication    If you develop nausea and vomiting that is not controlled by your nausea medication, call the clinic.   BELOW ARE SYMPTOMS THAT SHOULD BE REPORTED IMMEDIATELY:  *FEVER GREATER THAN 100.5 F  *CHILLS WITH OR WITHOUT FEVER  NAUSEA AND VOMITING THAT IS NOT CONTROLLED WITH YOUR NAUSEA MEDICATION  *UNUSUAL SHORTNESS OF BREATH  *UNUSUAL BRUISING OR BLEEDING  TENDERNESS IN MOUTH AND THROAT WITH OR WITHOUT PRESENCE OF ULCERS  *URINARY PROBLEMS  *BOWEL PROBLEMS  UNUSUAL RASH Items with * indicate a potential emergency and should be followed up as soon as possible.  Feel free to call the clinic you have any questions or concerns. The clinic phone number is [phone].

## 2014-05-04 NOTE — Progress Notes (Signed)
Per Dr. Benay Spice; OK to proceed with chemo; CMET not needed today. Verbal ordered for for pt to received 500 ml NS today with treatment.

## 2014-05-04 NOTE — Progress Notes (Signed)
Okay to treat today using CMET lab results from 04/27/14, per Dr. Benay Spice

## 2014-05-04 NOTE — Patient Instructions (Signed)

## 2014-05-04 NOTE — Telephone Encounter (Signed)
Pt confirmed labs/ov per 01/06 POF, gave pt AVS.... KJ,  Chemo already added

## 2014-05-04 NOTE — Progress Notes (Signed)
[facility] OFFICE PROGRESS NOTE   Diagnosis: Esophagus cancer  INTERVAL HISTORY:   Mr. Mcquerry returns as scheduled. He began Taxol/carboplatin and radiation 04/27/2014. He reports tolerating the treatment well. No nausea. No symptoms of an allergic reaction. He continues jejunostomy tube feedings. He has again developed liquid dysphagia. He is unable to swallow liquids at present. He reports feeling "dizzy "when standing for the past few days.  Objective:  Vital signs in last 24 hours:  Blood pressure 110/52, pulse 120, temperature 97.5 F (36.4 C), temperature source Oral, resp. rate 18, height 5\' 11"  (1.803 m), weight 171 lb 6.4 oz (77.747 kg), SpO2 100 %.    HEENT: Mucous membranes are moist, no thrush Resp: Distant breath sounds, no respiratory distress Cardio: Regular rate and rhythm GI: No hepatomegaly, nontender, left upper quadrant jejunostomy feeding tube, healed midline incision Vascular: No leg edema  Portacath/PICC-without erythema  Lab Results:  Lab Results  Component Value Date   WBC 7.6 05/04/2014   HGB 12.2* 05/04/2014   HCT 38.1* 05/04/2014   MCV 87.0 05/04/2014   PLT 245 05/04/2014   NEUTROABS 5.8 05/04/2014      Imaging:  Nm Pet Image Initial (pi) Skull Base To Thigh  05/03/2014   CLINICAL DATA:  Initial treatment strategy for esophageal carcinoma.  EXAM: NUCLEAR MEDICINE PET SKULL BASE TO THIGH  TECHNIQUE: 10.0 mCi F-18 FDG was injected intravenously. Full-ring PET imaging was performed from the skull base to thigh after the radiotracer. CT data was obtained and used for attenuation correction and anatomic localization.  FASTING BLOOD GLUCOSE:  Value: 86 mg/dl  COMPARISON:  Chest CT on 04/10/2014 and AP CT on 04/08/2014  FINDINGS: NECK  9 mm left supraclavicular lymph node on image 52/series for is hypermetabolic, with SUV max of 8.9. No other hypermetabolic cervical lymph nodes identified.  CHEST  Hypermetabolic mass is seen in the mid  thoracic esophagus with proximal esophageal dilatation. This mass has SUV max of 24.2. This is consistent with primary esophageal carcinoma.  Hypermetabolic mediastinal lymphadenopathy is seen along the posterior aspect of the esophageal mass and in the high right paratracheal and prevascular regions of the mediastinum. The high right paratracheal lymph node measures 2.3 cm on image 57/series 4 with SUV max of 8.8.  No suspicious pulmonary nodules seen by CT.  ABDOMEN/PELVIS  No abnormal hypermetabolic activity within the liver, pancreas, adrenal glands, or spleen. No hypermetabolic lymph nodes in the abdomen or pelvis. Percutaneous jejunostomy tube noted.  SKELETON  No focal hypermetabolic activity to suggest skeletal metastasis.  IMPRESSION: Hypermetabolic mid thoracic esophageal mass, consistent with primary esophageal carcinoma.  Hypermetabolic metastatic lymphadenopathy in mediastinum and left supraclavicular region.  No evidence of metastatic disease in abdomen or pelvis.   Electronically Signed   By: Earle Gell M.D.   On: 05/03/2014 15:36    Medications: I have reviewed the patient's current medications.  Assessment/Plan: 1. 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 Solid/liquid dysphagia secondary to #1  Jejunostomy feeding tube placement 04/07/2014  3. Small bowel obstruction following placement of the jejunostomy feeding tube-resolved  4. History of ulcerative colitis  5. Aspiration pneumonia December 2015    Disposition:  He tolerated the first treatment with Taxol/carboplatin well. The plan is to proceed with week  2 today. He will continue radiation. I suspect the "dizziness "may be related to a degree of  dehydration. He will increase the fluids via the jejunostomy feeding tube (100 mL each time the tube is flushed) and he will receive additional IV fluids today. He will contact us for persistent symptoms.  I reviewed the PET images with Mr. Sittner and his wife. He appears to have metastatic disease involving the status/left supraclavicular lymph nodes. The plan is to continue chemotherapy/radiation as scheduled. He will undergo restaging and see Dr. Servando Snare prior to making a decision on an esophagectomy.  Betsy Coder, MD  05/04/2014  5:03 PM

## 2014-05-05 ENCOUNTER — Encounter: Payer: Self-pay | Admitting: Radiation Oncology

## 2014-05-05 ENCOUNTER — Ambulatory Visit: Payer: BC Managed Care – PPO | Admitting: Radiation Oncology

## 2014-05-05 ENCOUNTER — Ambulatory Visit: Payer: 59

## 2014-05-06 ENCOUNTER — Ambulatory Visit: Payer: 59 | Admitting: Nutrition

## 2014-05-06 ENCOUNTER — Ambulatory Visit
Admission: RE | Admit: 2014-05-06 | Discharge: 2014-05-06 | Disposition: A | Discharge status: Home / Self Care | Payer: 59 | Source: Ambulatory Visit | Attending: Radiation Oncology | Admitting: Radiation Oncology

## 2014-05-06 ENCOUNTER — Encounter: Payer: Self-pay | Admitting: Radiation Oncology

## 2014-05-06 VITALS — BP 117/76 | HR 92 | Temp 97.6°F | Resp 20 | Wt 173.0 lb

## 2014-05-06 DIAGNOSIS — C159 Malignant neoplasm of esophagus, unspecified: Secondary | ICD-10-CM

## 2014-05-06 DIAGNOSIS — C154 Malignant neoplasm of middle third of esophagus: Secondary | ICD-10-CM

## 2014-05-06 NOTE — Progress Notes (Signed)
   Department of Radiation Oncology  Phone:  [phone] Fax:        [phone]  Weekly Treatment Note    Name: Billy Romero Date: 05/06/2014 MRN: [id number] DOB: 11-09-1955   Current dose: 10.8 Gy  Current fraction: 6   MEDICATIONS: Current Outpatient Prescriptions  Medication Sig Dispense Refill  . baclofen (LIORESAL) 10 MG tablet Take 10 mg by mouth 3 (three) times daily as needed for muscle spasms.   2  . [START ON 05/09/2014] emollient (BIAFINE) cream Apply 1 application topically 2 (two) times daily. Apply to affected skin area after rad tx and bedtime daily+    . hyaluronate sodium (RADIAPLEXRX) GEL Apply 1 application topically daily.    . Nutritional Supplements (FEEDING SUPPLEMENT, OSMOLITE 1.5 CAL,) LIQD Place 1,000 mLs into feeding tube daily.  0  . oxyCODONE (ROXICODONE) 5 MG/5ML solution Take 5 mLs (5 mg total) by mouth every 4 (four) hours as needed for severe pain (via peg tube). 150 mL 0  . PRESCRIPTION MEDICATION Chem [facility]    . prochlorperazine (COMPAZINE) 10 MG tablet Take 1 tablet (10 mg total) by mouth every 6 (six) hours as needed for nausea or vomiting. 20 tablet 1  . promethazine (PHENERGAN) 6.25 MG/5ML syrup Take 10 mLs (12.5 mg total) by mouth every 6 (six) hours as needed for nausea or vomiting. 120 mL 1   No current facility-administered medications for this encounter.     ALLERGIES: Review of patient's allergies indicates no known allergies.   LABORATORY DATA:  Lab Results  Component Value Date   WBC 7.6 05/04/2014   HGB 12.2* 05/04/2014   HCT 38.1* 05/04/2014   MCV 87.0 05/04/2014   PLT 245 05/04/2014   Lab Results  Component Value Date   NA 136 04/27/2014   K 3.8 04/27/2014   CL 107 04/21/2014   CO2 29 04/27/2014   Lab Results  Component Value Date   ALT 56* 04/27/2014   AST 25 04/27/2014   ALKPHOS 107 04/27/2014   BILITOT 0.56 04/27/2014     NARRATIVE: Billy Romero was seen today for weekly treatment management. The  chart was checked and the patient's films were reviewed.  Weekly rad txs, 6/30 esophagus completed, using radiaplex gel daily, unable to eat solids, tapes sips of water, and osmolite j-tube 5.1/2 cans , bid, breaks it up in am and pm, , with 667ml  free water daily, saw Billy Romero today, has hadstarted  taxol/carboplatin chemotherapy , , says he is doing better, still slight dizzy getting up in am, no c/o nausea, pain, diarrhea   PHYSICAL EXAMINATION: weight is 173 lb (78.472 kg). His oral temperature is 97.6 F (36.4 C). His blood pressure is 117/76 and his pulse is 92. His respiration is 20.        ASSESSMENT: The patient is doing satisfactorily with treatment.  PLAN: We will continue with the patient's radiation treatment as planned.

## 2014-05-06 NOTE — Progress Notes (Signed)
Nutrition follow-up completed with patient who is being treated for obstructive esophageal cancer. Patient reports good tolerance of J-tube feedings using Osmolite 1.5. Patient tolerates 110 mL an hour and receives approximately 5-1/2 cans daily. Patient continues to use Protostat twice a day.   Patient reports he has receiving approximately 600 mL free water. Patient is able to drink small amounts occasionally. Weight documented as 173.2 pounds on January 8.  This is decreased from 175.2 pounds, December 24. Patient is having 1 soft stool daily.  Nutrition diagnosis: Unintended weight loss continues.  Intervention:  Patient educated to continue jejunostomy feedings utilizing Osmolite 1.5 at 110 mL an hour over 12 hours. Recommended patient add an additional 240 cc of free water during the day. Patient encouraged to consume fluids as tolerated. Teach back method used.    Monitoring evaluation goals: Patient will tolerate jejunostomy feedings, protein supplements, free water flushes oral intake to minimize weight loss.  Next visit: Wednesday, January 13.  **Disclaimer: This note was dictated with voice recognition software. Similar sounding words can inadvertently be transcribed and this note may contain transcription errors which may not have been corrected upon publication of note.**

## 2014-05-06 NOTE — Progress Notes (Signed)
Weekly rad txs, 6/30 esophagus completed, using radiaplex gel daily, unable to eat solids, tapes sips of water, and osmolite j-tube 5.1/2 cans , bid, breaks it up in am and pm, , with 624ml  free water daily, saw Ronnald Collum today, has hadstarted  taxol/carboplatin chemotherapy , , says he is doing better, still slight dizzy getting up in am, no c/o nausea, pain, diarrhea 11:07 AM

## 2014-05-08 ENCOUNTER — Other Ambulatory Visit: Payer: Self-pay | Admitting: Oncology

## 2014-05-09 ENCOUNTER — Ambulatory Visit
Admission: RE | Admit: 2014-05-09 | Discharge: 2014-05-09 | Disposition: A | Discharge status: Home / Self Care | Payer: 59 | Source: Ambulatory Visit | Attending: Radiation Oncology | Admitting: Radiation Oncology

## 2014-05-09 DIAGNOSIS — C159 Malignant neoplasm of esophagus, unspecified: Secondary | ICD-10-CM | POA: Diagnosis not present

## 2014-05-10 ENCOUNTER — Ambulatory Visit
Admission: RE | Admit: 2014-05-10 | Discharge: 2014-05-10 | Disposition: A | Discharge status: Home / Self Care | Payer: 59 | Source: Ambulatory Visit | Attending: Radiation Oncology | Admitting: Radiation Oncology

## 2014-05-10 DIAGNOSIS — C159 Malignant neoplasm of esophagus, unspecified: Secondary | ICD-10-CM | POA: Diagnosis not present

## 2014-05-11 ENCOUNTER — Ambulatory Visit: Payer: 59 | Admitting: Nutrition

## 2014-05-11 ENCOUNTER — Ambulatory Visit
Admission: RE | Admit: 2014-05-11 | Discharge: 2014-05-11 | Disposition: A | Discharge status: Home / Self Care | Payer: 59 | Source: Ambulatory Visit | Attending: Radiation Oncology | Admitting: Radiation Oncology

## 2014-05-11 ENCOUNTER — Ambulatory Visit: Payer: 59 | Admitting: Radiation Oncology

## 2014-05-11 ENCOUNTER — Ambulatory Visit: Payer: 59

## 2014-05-11 ENCOUNTER — Ambulatory Visit (HOSPITAL_BASED_OUTPATIENT_CLINIC_OR_DEPARTMENT_OTHER): Payer: 59

## 2014-05-11 DIAGNOSIS — C159 Malignant neoplasm of esophagus, unspecified: Secondary | ICD-10-CM

## 2014-05-11 DIAGNOSIS — Z452 Encounter for adjustment and management of vascular access device: Secondary | ICD-10-CM

## 2014-05-11 DIAGNOSIS — Z5111 Encounter for antineoplastic chemotherapy: Secondary | ICD-10-CM

## 2014-05-11 LAB — CBC WITH DIFFERENTIAL/PLATELET
BASO%: 0.3 % (ref 0.0–2.0)
Basophils Absolute: 0 10*3/uL (ref 0.0–0.1)
EOS ABS: 0 10*3/uL (ref 0.0–0.5)
EOS%: 0.7 % (ref 0.0–7.0)
HCT: 34.1 % — ABNORMAL LOW (ref 38.4–49.9)
HGB: 11.5 g/dL — ABNORMAL LOW (ref 13.0–17.1)
LYMPH%: 19 % (ref 14.0–49.0)
MCH: 28.9 pg (ref 27.2–33.4)
MCHC: 33.7 g/dL (ref 32.0–36.0)
MCV: 85.7 fL (ref 79.3–98.0)
MONO#: 0.3 10*3/uL (ref 0.1–0.9)
MONO%: 11.1 % (ref 0.0–14.0)
NEUT#: 2.1 10*3/uL (ref 1.5–6.5)
NEUT%: 68.9 % (ref 39.0–75.0)
Platelets: 166 10*3/uL (ref 140–400)
RBC: 3.98 10*6/uL — ABNORMAL LOW (ref 4.20–5.82)
RDW: 12.7 % (ref 11.0–14.6)
WBC: 3.1 10*3/uL — AB (ref 4.0–10.3)
lymph#: 0.6 10*3/uL — ABNORMAL LOW (ref 0.9–3.3)

## 2014-05-11 LAB — COMPREHENSIVE METABOLIC PANEL (CC13)
ALK PHOS: 115 U/L (ref 40–150)
ALT: 61 U/L — AB (ref 0–55)
AST: 44 U/L — AB (ref 5–34)
Albumin: 3 g/dL — ABNORMAL LOW (ref 3.5–5.0)
Anion Gap: 10 mEq/L (ref 3–11)
BUN: 13.1 mg/dL (ref 7.0–26.0)
CO2: 29 meq/L (ref 22–29)
CREATININE: 0.8 mg/dL (ref 0.7–1.3)
Calcium: 8.8 mg/dL (ref 8.4–10.4)
Chloride: 100 mEq/L (ref 98–109)
EGFR: >90 mL/min/{1.73_m2} (ref >90)
Glucose: 95 mg/dl (ref 70–140)
Potassium: 4 mEq/L (ref 3.5–5.1)
Sodium: 138 mEq/L (ref 136–145)
Total Bilirubin: 0.72 mg/dL (ref 0.20–1.20)
Total Protein: 6.2 g/dL — ABNORMAL LOW (ref 6.4–8.3)

## 2014-05-11 MED ORDER — HEPARIN SOD (PORK) LOCK FLUSH 100 UNIT/ML IV SOLN
250.0000 [IU] | INTRAVENOUS | Status: AC | PRN
  Administered 2014-05-11: 250 [IU]
  Filled 2014-05-11: qty 5

## 2014-05-11 MED ORDER — FAMOTIDINE IN NACL 20-0.9 MG/50ML-% IV SOLN
INTRAVENOUS | Status: AC
  Filled 2014-05-11: qty 50

## 2014-05-11 MED ORDER — SODIUM CHLORIDE 0.9 % IJ SOLN
10.0000 mL | INTRAMUSCULAR | Status: DC | PRN
  Administered 2014-05-11: 10 mL via INTRAVENOUS
  Filled 2014-05-11: qty 10

## 2014-05-11 MED ORDER — DEXAMETHASONE SODIUM PHOSPHATE 10 MG/ML IJ SOLN
INTRAMUSCULAR | Status: AC
  Filled 2014-05-11: qty 1

## 2014-05-11 MED ORDER — DEXAMETHASONE SODIUM PHOSPHATE 10 MG/ML IJ SOLN
10.0000 mg | Freq: Once | INTRAMUSCULAR | Status: AC
  Administered 2014-05-11: 10 mg via INTRAVENOUS

## 2014-05-11 MED ORDER — DEXTROSE 5 % IV SOLN
50.0000 mg/m2 | Freq: Once | INTRAVENOUS | Status: AC
  Administered 2014-05-11: 102 mg via INTRAVENOUS
  Filled 2014-05-11: qty 17

## 2014-05-11 MED ORDER — ONDANSETRON 16 MG/50ML IVPB (CHCC)
INTRAVENOUS | Status: AC
  Filled 2014-05-11: qty 16

## 2014-05-11 MED ORDER — SODIUM CHLORIDE 0.9 % IJ SOLN
10.0000 mL | INTRAMUSCULAR | Status: DC | PRN
  Administered 2014-05-11: 10 mL
  Filled 2014-05-11: qty 10

## 2014-05-11 MED ORDER — SODIUM CHLORIDE 0.9 % IV SOLN
500.0000 mL | INTRAVENOUS | Status: DC
  Administered 2014-05-11: 15:00:00 via INTRAVENOUS

## 2014-05-11 MED ORDER — HEPARIN SOD (PORK) LOCK FLUSH 100 UNIT/ML IV SOLN
250.0000 [IU] | Freq: Once | INTRAVENOUS | Status: AC | PRN
  Administered 2014-05-11: 250 [IU]
  Filled 2014-05-11: qty 5

## 2014-05-11 MED ORDER — ONDANSETRON 16 MG/50ML IVPB (CHCC)
16.0000 mg | Freq: Once | INTRAVENOUS | Status: AC
  Administered 2014-05-11: 16 mg via INTRAVENOUS

## 2014-05-11 MED ORDER — HEPARIN SOD (PORK) LOCK FLUSH 100 UNIT/ML IV SOLN
500.0000 [IU] | Freq: Once | INTRAVENOUS | Status: AC
  Administered 2014-05-11: 250 [IU] via INTRAVENOUS
  Filled 2014-05-11: qty 5

## 2014-05-11 MED ORDER — FAMOTIDINE IN NACL 20-0.9 MG/50ML-% IV SOLN
20.0000 mg | Freq: Once | INTRAVENOUS | Status: AC
  Administered 2014-05-11: 20 mg via INTRAVENOUS

## 2014-05-11 MED ORDER — SODIUM CHLORIDE 0.9 % IV SOLN
Freq: Once | INTRAVENOUS | Status: AC
  Administered 2014-05-11: 13:00:00 via INTRAVENOUS

## 2014-05-11 MED ORDER — SODIUM CHLORIDE 0.9 % IV SOLN
225.4000 mg | Freq: Once | INTRAVENOUS | Status: AC
  Administered 2014-05-11: 230 mg via INTRAVENOUS
  Filled 2014-05-11: qty 23

## 2014-05-11 MED ORDER — DIPHENHYDRAMINE HCL 50 MG/ML IJ SOLN
25.0000 mg | Freq: Once | INTRAMUSCULAR | Status: AC
  Administered 2014-05-11: 25 mg via INTRAVENOUS

## 2014-05-11 MED ORDER — SODIUM CHLORIDE 0.9 % IJ SOLN
10.0000 mL | INTRAMUSCULAR | Status: AC | PRN
  Administered 2014-05-11: 10 mL
  Filled 2014-05-11: qty 10

## 2014-05-11 MED ORDER — DIPHENHYDRAMINE HCL 50 MG/ML IJ SOLN
INTRAMUSCULAR | Status: AC
  Filled 2014-05-11: qty 1

## 2014-05-11 NOTE — Progress Notes (Signed)
Patient feels like he is dehydrated.  Discussed with dietician and recommended more IV fluids today.  Discussed with Dr. Benay Spice.  VO given and read back to bolus 500ccNS with chemotherapy today.  Patient is agreeable. At discharge patient states he feels better after additional fluids

## 2014-05-11 NOTE — Patient Instructions (Signed)
PICC Home Guide A peripherally inserted central catheter (PICC) is a long, thin, flexible tube that is inserted into a vein in the upper arm. It is a form of intravenous (IV) access. It is considered to be a "central" line because the tip of the PICC ends in a large vein in your chest. This large vein is called the superior vena cava (SVC). The PICC tip ends in the SVC because there is a lot of blood flow in the SVC. This allows medicines and IV fluids to be quickly distributed throughout the body. The PICC is inserted using a sterile technique by a specially trained nurse or physician. After the PICC is inserted, a chest X-ray exam is done to be sure it is in the correct place.  A PICC may be placed for different reasons, such as:  To give medicines and liquid nutrition that can only be given through a central line. Examples are:  Certain antibiotic treatments.  Chemotherapy.  Total parenteral nutrition (TPN).  To take frequent blood samples.  To give IV fluids and blood products.  If there is difficulty placing a peripheral intravenous (PIV) catheter. If taken care of properly, a PICC can remain in place for several months. A PICC can also allow a person to go home from the hospital early. Medicine and PICC care can be managed at home by a family member or home health care team. WHAT PROBLEMS CAN HAPPEN WHEN I HAVE A PICC? Problems with a PICC can occasionally occur. These may include the following:  A blood clot (thrombus) forming in or at the tip of the PICC. This can cause the PICC to become clogged. A clot-dissolving medicine called tissue plasminogen activator (tPA) can be given through the PICC to help break up the clot.  Inflammation of the vein (phlebitis) in which the PICC is placed. Signs of inflammation may include redness, pain at the insertion site, red streaks, or being able to feel a "cord" in the vein where the PICC is located.  Infection in the PICC or at the insertion  site. Signs of infection may include fever, chills, redness, swelling, or pus drainage from the PICC insertion site.  PICC movement (malposition). The PICC tip may move from its original position due to excessive physical activity, forceful coughing, sneezing, or vomiting.  A break or cut in the PICC. It is important to not use scissors near the PICC.  Nerve or tendon irritation or injury during PICC insertion. WHAT SHOULD I KEEP IN MIND ABOUT ACTIVITIES WHEN I HAVE A PICC?  You may bend your arm and move it freely. If your PICC is near or at the bend of your elbow, avoid activity with repeated motion at the elbow.  Rest at home for the remainder of the day following PICC line insertion.  Avoid lifting heavy objects as instructed by your health care provider.  Avoid using a crutch with the arm on the same side as your PICC. You may need to use a walker. WHAT SHOULD I KNOW ABOUT MY PICC DRESSING?  Keep your PICC bandage (dressing) clean and dry to prevent infection.  Ask your health care provider when you may shower. Ask your health care provider to teach you how to wrap the PICC when you do take a shower.  Change the PICC dressing as instructed by your health care provider.  Change your PICC dressing if it becomes loose or wet. WHAT SHOULD I KNOW ABOUT PICC CARE?  Check the PICC insertion site   daily for leakage, redness, swelling, or pain.  Do not take a bath, swim, or use hot tubs when you have a PICC. Cover PICC line with clear plastic wrap and tape to keep it dry while showering.  Flush the PICC as directed by your health care provider. Let your health care provider know right away if the PICC is difficult to flush or does not flush. Do not use force to flush the PICC.  Do not use a syringe that is less than 10 mL to flush the PICC.  Never pull or tug on the PICC.  Avoid blood pressure checks on the arm with the PICC.  Keep your PICC identification card with you at all  times.  Do not take the PICC out yourself. Only a trained clinical professional should remove the PICC. SEEK IMMEDIATE MEDICAL CARE IF:  Your PICC is accidentally pulled all the way out. If this happens, cover the insertion site with a bandage or gauze dressing. Do not throw the PICC away. Your health care provider will need to inspect it.  Your PICC was tugged or pulled and has partially come out. Do not  push the PICC back in.  There is any type of drainage, redness, or swelling where the PICC enters the skin.  You cannot flush the PICC, it is difficult to flush, or the PICC leaks around the insertion site when it is flushed.  You hear a "flushing" sound when the PICC is flushed.  You have pain, discomfort, or numbness in your arm, shoulder, or jaw on the same side as the PICC.  You feel your heart "racing" or skipping beats.  You notice a hole or tear in the PICC.  You develop chills or a fever. MAKE SURE YOU:   Understand these instructions.  Will watch your condition.  Will get help right away if you are not doing well or get worse. Document Released: 10/20/2002 Document Revised: 08/30/2013 Document Reviewed: 12/21/2012 [facility] Patient Information 2015 [place], [place]. This information is not intended to replace advice given to you by your health care provider. Make sure you discuss any questions you have with your health care provider.

## 2014-05-11 NOTE — Progress Notes (Signed)
Nutrition follow-up completed with patient and wife.  He is receiving chemotherapy and radiation treatment for obstructive esophageal cancer. Patient unable to consume anything by mouth. Patient is tolerating Osmolite 1.5 via jejunostomy feeding tube at 110 mL an hour for approximately 12 hours daily. Patient continues to use Protostat twice a day. Patient is receiving approximately 600-720 mL free water daily. Weight decreased and documented as 169.8 pounds January 13, down from 173.2 pounds January 8.  Nutrition diagnosis: Unintended weight loss continues.  Intervention:  Recommended patient increase jejunostomy feedings to 120 mL an hour as tolerated. Patient to continue free water flushes as tolerated throughout the day. Patient will increase oral intake as tolerated. Teach back method used.    Monitoring, evaluation, goals: Patient will work to tolerate increased feedings through jejunostomy tube to minimize further weight loss.  Next visit: Friday, January 29 after radiation therapy.  **Disclaimer: This note was dictated with voice recognition software. Similar sounding words can inadvertently be transcribed and this note may contain transcription errors which may not have been corrected upon publication of note.**

## 2014-05-11 NOTE — Patient Instructions (Signed)
[facility] Discharge Instructions for Patients Receiving Chemotherapy  Today you received the following chemotherapy agents Taxol/Carboplatin  To help prevent nausea and vomiting after your treatment, we encourage you to take your nausea medication    If you develop nausea and vomiting that is not controlled by your nausea medication, call the clinic.   BELOW ARE SYMPTOMS THAT SHOULD BE REPORTED IMMEDIATELY:  *FEVER GREATER THAN 100.5 F  *CHILLS WITH OR WITHOUT FEVER  NAUSEA AND VOMITING THAT IS NOT CONTROLLED WITH YOUR NAUSEA MEDICATION  *UNUSUAL SHORTNESS OF BREATH  *UNUSUAL BRUISING OR BLEEDING  TENDERNESS IN MOUTH AND THROAT WITH OR WITHOUT PRESENCE OF ULCERS  *URINARY PROBLEMS  *BOWEL PROBLEMS  UNUSUAL RASH Items with * indicate a potential emergency and should be followed up as soon as possible.  Feel free to call the clinic you have any questions or concerns. The clinic phone number is [phone].

## 2014-05-12 ENCOUNTER — Other Ambulatory Visit: Payer: Self-pay | Admitting: *Deleted

## 2014-05-12 ENCOUNTER — Ambulatory Visit
Admission: RE | Admit: 2014-05-12 | Discharge: 2014-05-12 | Disposition: A | Discharge status: Home / Self Care | Payer: 59 | Source: Ambulatory Visit | Attending: Radiation Oncology | Admitting: Radiation Oncology

## 2014-05-12 DIAGNOSIS — C159 Malignant neoplasm of esophagus, unspecified: Secondary | ICD-10-CM

## 2014-05-12 MED ORDER — PROMETHAZINE HCL 6.25 MG/5ML PO SYRP: 12.5000 mg | ORAL_SOLUTION | Freq: Four times a day (QID) | ORAL | Status: DC | PRN

## 2014-05-13 ENCOUNTER — Ambulatory Visit
Admission: RE | Admit: 2014-05-13 | Discharge: 2014-05-13 | Disposition: A | Discharge status: Home / Self Care | Payer: 59 | Source: Ambulatory Visit | Attending: Radiation Oncology | Admitting: Radiation Oncology

## 2014-05-13 DIAGNOSIS — C159 Malignant neoplasm of esophagus, unspecified: Secondary | ICD-10-CM | POA: Diagnosis not present

## 2014-05-15 ENCOUNTER — Other Ambulatory Visit: Payer: Self-pay | Admitting: Oncology

## 2014-05-16 ENCOUNTER — Ambulatory Visit: Admission: RE | Admit: 2014-05-16 | Payer: 59 | Source: Ambulatory Visit | Admitting: Radiation Oncology

## 2014-05-16 ENCOUNTER — Ambulatory Visit
Admission: RE | Admit: 2014-05-16 | Discharge: 2014-05-16 | Disposition: A | Discharge status: Home / Self Care | Payer: 59 | Source: Ambulatory Visit | Attending: Radiation Oncology | Admitting: Radiation Oncology

## 2014-05-16 DIAGNOSIS — C159 Malignant neoplasm of esophagus, unspecified: Secondary | ICD-10-CM | POA: Diagnosis not present

## 2014-05-17 ENCOUNTER — Ambulatory Visit
Admission: RE | Admit: 2014-05-17 | Discharge: 2014-05-17 | Disposition: A | Discharge status: Home / Self Care | Payer: 59 | Source: Ambulatory Visit | Attending: Radiation Oncology | Admitting: Radiation Oncology

## 2014-05-17 DIAGNOSIS — C159 Malignant neoplasm of esophagus, unspecified: Secondary | ICD-10-CM | POA: Diagnosis not present

## 2014-05-18 ENCOUNTER — Telehealth: Payer: Self-pay | Admitting: Oncology

## 2014-05-18 ENCOUNTER — Ambulatory Visit
Admission: RE | Admit: 2014-05-18 | Discharge: 2014-05-18 | Disposition: A | Discharge status: Home / Self Care | Payer: 59 | Source: Ambulatory Visit | Attending: Radiation Oncology | Admitting: Radiation Oncology

## 2014-05-18 ENCOUNTER — Ambulatory Visit (HOSPITAL_BASED_OUTPATIENT_CLINIC_OR_DEPARTMENT_OTHER): Payer: 59

## 2014-05-18 ENCOUNTER — Other Ambulatory Visit: Payer: Self-pay | Admitting: Oncology

## 2014-05-18 ENCOUNTER — Ambulatory Visit (HOSPITAL_BASED_OUTPATIENT_CLINIC_OR_DEPARTMENT_OTHER): Payer: 59 | Admitting: Oncology

## 2014-05-18 ENCOUNTER — Telehealth: Payer: Self-pay | Admitting: *Deleted

## 2014-05-18 ENCOUNTER — Other Ambulatory Visit: Payer: Self-pay | Admitting: *Deleted

## 2014-05-18 ENCOUNTER — Other Ambulatory Visit (HOSPITAL_BASED_OUTPATIENT_CLINIC_OR_DEPARTMENT_OTHER): Payer: 59

## 2014-05-18 ENCOUNTER — Ambulatory Visit: Payer: 59

## 2014-05-18 VITALS — BP 103/68 | HR 100 | Temp 97.8°F | Resp 19 | Ht 71.0 in | Wt 174.2 lb

## 2014-05-18 DIAGNOSIS — C154 Malignant neoplasm of middle third of esophagus: Secondary | ICD-10-CM

## 2014-05-18 DIAGNOSIS — Z5111 Encounter for antineoplastic chemotherapy: Secondary | ICD-10-CM

## 2014-05-18 DIAGNOSIS — C159 Malignant neoplasm of esophagus, unspecified: Secondary | ICD-10-CM

## 2014-05-18 DIAGNOSIS — K5190 Ulcerative colitis, unspecified, without complications: Secondary | ICD-10-CM

## 2014-05-18 DIAGNOSIS — R1310 Dysphagia, unspecified: Secondary | ICD-10-CM

## 2014-05-18 DIAGNOSIS — Z452 Encounter for adjustment and management of vascular access device: Secondary | ICD-10-CM

## 2014-05-18 LAB — COMPREHENSIVE METABOLIC PANEL (CC13)
ALBUMIN: 2.9 g/dL — AB (ref 3.5–5.0)
ALT: 40 U/L (ref 0–55)
AST: 31 U/L (ref 5–34)
Alkaline Phosphatase: 98 U/L (ref 40–150)
Anion Gap: 7 mEq/L (ref 3–11)
BUN: 11.5 mg/dL (ref 7.0–26.0)
CHLORIDE: 104 meq/L (ref 98–109)
CO2: 28 meq/L (ref 22–29)
Calcium: 8.8 mg/dL (ref 8.4–10.4)
Creatinine: 0.8 mg/dL (ref 0.7–1.3)
EGFR: >90 mL/min/{1.73_m2} (ref >90)
Glucose: 100 mg/dl (ref 70–140)
Potassium: 4.3 mEq/L (ref 3.5–5.1)
Sodium: 139 mEq/L (ref 136–145)
Total Bilirubin: 0.47 mg/dL (ref 0.20–1.20)
Total Protein: 5.8 g/dL — ABNORMAL LOW (ref 6.4–8.3)

## 2014-05-18 LAB — CBC WITH DIFFERENTIAL/PLATELET
BASO%: 1 % (ref 0.0–2.0)
Basophils Absolute: 0 10*3/uL (ref 0.0–0.1)
EOS ABS: 0 10*3/uL (ref 0.0–0.5)
EOS%: 0.7 % (ref 0.0–7.0)
HCT: 33.2 % — ABNORMAL LOW (ref 38.4–49.9)
HEMOGLOBIN: 10.9 g/dL — AB (ref 13.0–17.1)
LYMPH%: 17.6 % (ref 14.0–49.0)
MCH: 28.8 pg (ref 27.2–33.4)
MCHC: 32.9 g/dL (ref 32.0–36.0)
MCV: 87.7 fL (ref 79.3–98.0)
MONO#: 0.3 10*3/uL (ref 0.1–0.9)
MONO%: 10.7 % (ref 0.0–14.0)
NEUT%: 70 % (ref 39.0–75.0)
NEUTROS ABS: 2 10*3/uL (ref 1.5–6.5)
PLATELETS: 138 10*3/uL — AB (ref 140–400)
RBC: 3.78 10*6/uL — ABNORMAL LOW (ref 4.20–5.82)
RDW: 13.2 % (ref 11.0–14.6)
WBC: 2.9 10*3/uL — ABNORMAL LOW (ref 4.0–10.3)
lymph#: 0.5 10*3/uL — ABNORMAL LOW (ref 0.9–3.3)

## 2014-05-18 MED ORDER — DIPHENHYDRAMINE HCL 50 MG/ML IJ SOLN
INTRAMUSCULAR | Status: AC
  Filled 2014-05-18: qty 1

## 2014-05-18 MED ORDER — HEPARIN SOD (PORK) LOCK FLUSH 100 UNIT/ML IV SOLN
500.0000 [IU] | Freq: Once | INTRAVENOUS | Status: AC | PRN
  Administered 2014-05-18: 500 [IU]
  Filled 2014-05-18: qty 5

## 2014-05-18 MED ORDER — SODIUM CHLORIDE 0.9 % IV SOLN
Freq: Once | INTRAVENOUS | Status: AC
  Administered 2014-05-18: 12:00:00 via INTRAVENOUS

## 2014-05-18 MED ORDER — OXYCODONE HCL 5 MG/5ML PO SOLN: 5.0000 mg | ORAL | Status: DC | PRN

## 2014-05-18 MED ORDER — DIPHENHYDRAMINE HCL 50 MG/ML IJ SOLN
25.0000 mg | Freq: Once | INTRAMUSCULAR | Status: AC
  Administered 2014-05-18: 25 mg via INTRAVENOUS

## 2014-05-18 MED ORDER — DEXAMETHASONE SODIUM PHOSPHATE 10 MG/ML IJ SOLN
10.0000 mg | Freq: Once | INTRAMUSCULAR | Status: AC
  Administered 2014-05-18: 10 mg via INTRAVENOUS

## 2014-05-18 MED ORDER — FAMOTIDINE IN NACL 20-0.9 MG/50ML-% IV SOLN
20.0000 mg | Freq: Once | INTRAVENOUS | Status: AC
  Administered 2014-05-18: 20 mg via INTRAVENOUS

## 2014-05-18 MED ORDER — SODIUM CHLORIDE 0.9 % IV SOLN
225.4000 mg | Freq: Once | INTRAVENOUS | Status: AC
  Administered 2014-05-18: 230 mg via INTRAVENOUS
  Filled 2014-05-18: qty 23

## 2014-05-18 MED ORDER — SUCRALFATE 1 G PO TABS
1.0000 g | ORAL_TABLET | Freq: Four times a day (QID) | ORAL | Status: DC
Start: 2014-05-18 — End: 2014-07-06

## 2014-05-18 MED ORDER — SODIUM CHLORIDE 0.9 % IJ SOLN
10.0000 mL | INTRAMUSCULAR | Status: DC | PRN
  Administered 2014-05-18: 10 mL via INTRAVENOUS
  Filled 2014-05-18: qty 10

## 2014-05-18 MED ORDER — FAMOTIDINE IN NACL 20-0.9 MG/50ML-% IV SOLN
INTRAVENOUS | Status: AC
  Filled 2014-05-18: qty 50

## 2014-05-18 MED ORDER — ONDANSETRON 16 MG/50ML IVPB (CHCC)
INTRAVENOUS | Status: AC
  Filled 2014-05-18: qty 16

## 2014-05-18 MED ORDER — SODIUM CHLORIDE 0.9 % IJ SOLN
10.0000 mL | INTRAMUSCULAR | Status: DC | PRN
  Administered 2014-05-18: 10 mL
  Filled 2014-05-18: qty 10

## 2014-05-18 MED ORDER — PACLITAXEL CHEMO INJECTION 300 MG/50ML
50.0000 mg/m2 | Freq: Once | INTRAVENOUS | Status: AC
  Administered 2014-05-18: 102 mg via INTRAVENOUS
  Filled 2014-05-18: qty 17

## 2014-05-18 MED ORDER — ONDANSETRON 16 MG/50ML IVPB (CHCC)
16.0000 mg | Freq: Once | INTRAVENOUS | Status: AC
  Administered 2014-05-18: 16 mg via INTRAVENOUS

## 2014-05-18 MED ORDER — DEXAMETHASONE SODIUM PHOSPHATE 10 MG/ML IJ SOLN
INTRAMUSCULAR | Status: AC
  Filled 2014-05-18: qty 1

## 2014-05-18 NOTE — Telephone Encounter (Signed)
Pt confirmed labs/ov per 01/20 POF, gave pt AVS.... KJ, sent msg to add chemo

## 2014-05-18 NOTE — Progress Notes (Signed)
Department of Radiation Oncology  Phone:  [phone] Fax:        [phone]  Weekly Treatment Note    Name: Billy Romero Date: 05/18/2014 MRN: [id number] DOB: 1955/08/14   Current dose: 25.2 Gy  Current fraction: 14   MEDICATIONS: Current Outpatient Prescriptions  Medication Sig Dispense Refill  . baclofen (LIORESAL) 10 MG tablet Take 10 mg by mouth 3 (three) times daily as needed for muscle spasms.   2  . emollient (BIAFINE) cream Apply 1 application topically 2 (two) times daily. Apply to affected skin area after rad tx and bedtime daily+    . hyaluronate sodium (RADIAPLEXRX) GEL Apply 1 application topically daily.    . Nutritional Supplements (FEEDING SUPPLEMENT, OSMOLITE 1.5 CAL,) LIQD Place 1,000 mLs into feeding tube daily.  0  . oxyCODONE (ROXICODONE) 5 MG/5ML solution Take 5 mLs (5 mg total) by mouth every 4 (four) hours as needed for severe pain (via peg tube). 150 mL 0  . PRESCRIPTION MEDICATION Chem [facility]    . prochlorperazine (COMPAZINE) 10 MG tablet Take 1 tablet (10 mg total) by mouth every 6 (six) hours as needed for nausea or vomiting. 20 tablet 1  . promethazine (PHENERGAN) 6.25 MG/5ML syrup Take 10 mLs (12.5 mg total) by mouth every 6 (six) hours as needed for nausea or vomiting. 120 mL 3   No current facility-administered medications for this encounter.   Facility-Administered Medications Ordered in Other Encounters  Medication Dose Route Frequency Provider Last Rate Last Dose  . CARBOplatin (PARAPLATIN) 230 mg in sodium chloride 0.9 % 100 mL chemo infusion  230 mg Intravenous Once Ladell Pier, MD      . famotidine (PEPCID) IVPB 20 mg  20 mg Intravenous Once Ladell Pier, MD      . heparin lock flush 100 unit/mL  500 Units Intracatheter Once PRN Ladell Pier, MD      . ondansetron (ZOFRAN) IVPB 16 mg  16 mg Intravenous Once Ladell Pier, MD   16 mg at 05/18/14 1145  . PACLitaxel (TAXOL) 102 mg in dextrose 5 % 250 mL chemo infusion (</=  80mg /m2)  50 mg/m2 (Treatment Plan Actual) Intravenous Once Ladell Pier, MD      . sodium chloride 0.9 % injection 10 mL  10 mL Intracatheter PRN Ladell Pier, MD         ALLERGIES: Review of patient's allergies indicates no known allergies.   LABORATORY DATA:  Lab Results  Component Value Date   WBC 2.9* 05/18/2014   HGB 10.9* 05/18/2014   HCT 33.2* 05/18/2014   MCV 87.7 05/18/2014   PLT 138* 05/18/2014   Lab Results  Component Value Date   NA 139 05/18/2014   K 4.3 05/18/2014   CL 107 04/21/2014   CO2 28 05/18/2014   Lab Results  Component Value Date   ALT 40 05/18/2014   AST 31 05/18/2014   ALKPHOS 98 05/18/2014   BILITOT 0.47 05/18/2014     NARRATIVE: Billy Romero was seen today for weekly treatment management. The chart was checked and the patient's films were reviewed.  The patient states he is doing well today. He has noticed a significant improvement in his swallowing over the last week. He is able to drink fluids and eat soft foods currently without substantial difficulty. He has noticed the emergence of a little bit of esophagitis/irritation but this has been fairly mild.  PHYSICAL EXAMINATION: vitals were not taken for this visit.  alert, in no acute distress  ASSESSMENT: The patient is doing satisfactorily with treatment.  PLAN: We will continue with the patient's radiation treatment as planned. I have called in a prescription for Carafate for the patient as well.

## 2014-05-18 NOTE — Patient Instructions (Signed)
[facility] Discharge Instructions for Patients Receiving Chemotherapy  Today you received the following chemotherapy agents Taxol/Carboplatin  To help prevent nausea and vomiting after your treatment, we encourage you to take your nausea medication    If you develop nausea and vomiting that is not controlled by your nausea medication, call the clinic.   BELOW ARE SYMPTOMS THAT SHOULD BE REPORTED IMMEDIATELY:  *FEVER GREATER THAN 100.5 F  *CHILLS WITH OR WITHOUT FEVER  NAUSEA AND VOMITING THAT IS NOT CONTROLLED WITH YOUR NAUSEA MEDICATION  *UNUSUAL SHORTNESS OF BREATH  *UNUSUAL BRUISING OR BLEEDING  TENDERNESS IN MOUTH AND THROAT WITH OR WITHOUT PRESENCE OF ULCERS  *URINARY PROBLEMS  *BOWEL PROBLEMS  UNUSUAL RASH Items with * indicate a potential emergency and should be followed up as soon as possible.  Feel free to call the clinic you have any questions or concerns. The clinic phone number is [phone].

## 2014-05-18 NOTE — Progress Notes (Signed)
  [facility] OFFICE PROGRESS NOTE   Diagnosis: Esophagus cancer  INTERVAL HISTORY:   [name] returns as scheduled. He continues radiation. He has completed 3 treatments with Taxol/carboplatin. He reports nausea for a few days following chemotherapy. The dysphagia has improved. He is now tolerating liquids and soft foods. Phenergan helps the nausea. No neuropathy symptoms. He continues tube feedings. No further dizziness. Mild odynophagia.  Objective:  Vital signs in last 24 hours:  Blood pressure 103/68, pulse 100, temperature 97.8 F (36.6 C), temperature source Oral, resp. rate 19, height 5\' 11"  (1.803 m), weight 174 lb 3.2 oz (79.017 kg), SpO2 100 %.    HEENT: No thrush or ulcers Resp: Bronchial sounds at the upper posterior chest bilaterally, no respiratory distress Cardio: Regular rate and rhythm GI: No hepatomegaly, jejunostomy feeding tube site without evidence of infection, nontender Vascular: No leg edema   Portacath/PICC-without erythema  Lab Results:  Lab Results  Component Value Date   WBC 2.9* 05/18/2014   HGB 10.9* 05/18/2014   HCT 33.2* 05/18/2014   MCV 87.7 05/18/2014   PLT 138* 05/18/2014   NEUTROABS 2.0 05/18/2014     Medications: I have reviewed the patient's current medications.  Assessment/Plan: 1. 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 Solid/liquid dysphagia secondary to #1  Jejunostomy feeding tube placement 04/07/2014  3. Small bowel obstruction following placement of the jejunostomy feeding tube-resolved  4. History of ulcerative colitis  5. Aspiration pneumonia December 2015   Disposition:  [name] appears to be tolerating the  treatment well. The dysphagia has improved. He will continue radiation. He will complete week #4 chemotherapy today. He will return for a final treatment with chemotherapy next week.  [name] will be scheduled for an office visit 06/08/2014. We will see him in the interim as needed. He will undergo a restaging evaluation after the completion of radiation.  Betsy Coder, MD  05/18/2014  2:09 PM

## 2014-05-18 NOTE — Patient Instructions (Signed)
PICC Home Guide A peripherally inserted central catheter (PICC) is a long, thin, flexible tube that is inserted into a vein in the upper arm. It is a form of intravenous (IV) access. It is considered to be a "central" line because the tip of the PICC ends in a large vein in your chest. This large vein is called the superior vena cava (SVC). The PICC tip ends in the SVC because there is a lot of blood flow in the SVC. This allows medicines and IV fluids to be quickly distributed throughout the body. The PICC is inserted using a sterile technique by a specially trained nurse or physician. After the PICC is inserted, a chest X-ray exam is done to be sure it is in the correct place.  A PICC may be placed for different reasons, such as:  To give medicines and liquid nutrition that can only be given through a central line. Examples are:  Certain antibiotic treatments.  Chemotherapy.  Total parenteral nutrition (TPN).  To take frequent blood samples.  To give IV fluids and blood products.  If there is difficulty placing a peripheral intravenous (PIV) catheter. If taken care of properly, a PICC can remain in place for several months. A PICC can also allow a person to go home from the hospital early. Medicine and PICC care can be managed at home by a family member or home health care team. WHAT PROBLEMS CAN HAPPEN WHEN I HAVE A PICC? Problems with a PICC can occasionally occur. These may include the following:  A blood clot (thrombus) forming in or at the tip of the PICC. This can cause the PICC to become clogged. A clot-dissolving medicine called tissue plasminogen activator (tPA) can be given through the PICC to help break up the clot.  Inflammation of the vein (phlebitis) in which the PICC is placed. Signs of inflammation may include redness, pain at the insertion site, red streaks, or being able to feel a "cord" in the vein where the PICC is located.  Infection in the PICC or at the insertion  site. Signs of infection may include fever, chills, redness, swelling, or pus drainage from the PICC insertion site.  PICC movement (malposition). The PICC tip may move from its original position due to excessive physical activity, forceful coughing, sneezing, or vomiting.  A break or cut in the PICC. It is important to not use scissors near the PICC.  Nerve or tendon irritation or injury during PICC insertion. WHAT SHOULD I KEEP IN MIND ABOUT ACTIVITIES WHEN I HAVE A PICC?  You may bend your arm and move it freely. If your PICC is near or at the bend of your elbow, avoid activity with repeated motion at the elbow.  Rest at home for the remainder of the day following PICC line insertion.  Avoid lifting heavy objects as instructed by your health care provider.  Avoid using a crutch with the arm on the same side as your PICC. You may need to use a walker. WHAT SHOULD I KNOW ABOUT MY PICC DRESSING?  Keep your PICC bandage (dressing) clean and dry to prevent infection.  Ask your health care provider when you may shower. Ask your health care provider to teach you how to wrap the PICC when you do take a shower.  Change the PICC dressing as instructed by your health care provider.  Change your PICC dressing if it becomes loose or wet. WHAT SHOULD I KNOW ABOUT PICC CARE?  Check the PICC insertion site   daily for leakage, redness, swelling, or pain.  Do not take a bath, swim, or use hot tubs when you have a PICC. Cover PICC line with clear plastic wrap and tape to keep it dry while showering.  Flush the PICC as directed by your health care provider. Let your health care provider know right away if the PICC is difficult to flush or does not flush. Do not use force to flush the PICC.  Do not use a syringe that is less than 10 mL to flush the PICC.  Never pull or tug on the PICC.  Avoid blood pressure checks on the arm with the PICC.  Keep your PICC identification card with you at all  times.  Do not take the PICC out yourself. Only a trained clinical professional should remove the PICC. SEEK IMMEDIATE MEDICAL CARE IF:  Your PICC is accidentally pulled all the way out. If this happens, cover the insertion site with a bandage or gauze dressing. Do not throw the PICC away. Your health care provider will need to inspect it.  Your PICC was tugged or pulled and has partially come out. Do not  push the PICC back in.  There is any type of drainage, redness, or swelling where the PICC enters the skin.  You cannot flush the PICC, it is difficult to flush, or the PICC leaks around the insertion site when it is flushed.  You hear a "flushing" sound when the PICC is flushed.  You have pain, discomfort, or numbness in your arm, shoulder, or jaw on the same side as the PICC.  You feel your heart "racing" or skipping beats.  You notice a hole or tear in the PICC.  You develop chills or a fever. MAKE SURE YOU:   Understand these instructions.  Will watch your condition.  Will get help right away if you are not doing well or get worse. Document Released: 10/20/2002 Document Revised: 08/30/2013 Document Reviewed: 12/21/2012 [facility] Patient Information 2015 [place], [place]. This information is not intended to replace advice given to you by your health care provider. Make sure you discuss any questions you have with your health care provider.

## 2014-05-18 NOTE — Telephone Encounter (Signed)
Per staff message and POF I have scheduled appts. Advised scheduler of appts. JMW  

## 2014-05-19 ENCOUNTER — Ambulatory Visit
Admission: RE | Admit: 2014-05-19 | Discharge: 2014-05-19 | Disposition: A | Discharge status: Home / Self Care | Payer: 59 | Source: Ambulatory Visit | Attending: Radiation Oncology | Admitting: Radiation Oncology

## 2014-05-19 DIAGNOSIS — C159 Malignant neoplasm of esophagus, unspecified: Secondary | ICD-10-CM | POA: Diagnosis not present

## 2014-05-20 ENCOUNTER — Ambulatory Visit: Payer: 59 | Admitting: Radiation Oncology

## 2014-05-20 ENCOUNTER — Ambulatory Visit: Payer: 59

## 2014-05-22 ENCOUNTER — Other Ambulatory Visit: Payer: Self-pay | Admitting: Oncology

## 2014-05-23 ENCOUNTER — Ambulatory Visit: Payer: 59

## 2014-05-23 ENCOUNTER — Ambulatory Visit
Admission: RE | Admit: 2014-05-23 | Discharge: 2014-05-23 | Disposition: A | Discharge status: Home / Self Care | Payer: 59 | Source: Ambulatory Visit | Attending: Radiation Oncology | Admitting: Radiation Oncology

## 2014-05-23 DIAGNOSIS — C159 Malignant neoplasm of esophagus, unspecified: Secondary | ICD-10-CM | POA: Diagnosis not present

## 2014-05-24 ENCOUNTER — Ambulatory Visit
Admission: RE | Admit: 2014-05-24 | Discharge: 2014-05-24 | Disposition: A | Discharge status: Home / Self Care | Payer: 59 | Source: Ambulatory Visit | Attending: Radiation Oncology | Admitting: Radiation Oncology

## 2014-05-24 DIAGNOSIS — C159 Malignant neoplasm of esophagus, unspecified: Secondary | ICD-10-CM | POA: Diagnosis not present

## 2014-05-25 ENCOUNTER — Ambulatory Visit (HOSPITAL_BASED_OUTPATIENT_CLINIC_OR_DEPARTMENT_OTHER): Payer: 59

## 2014-05-25 ENCOUNTER — Ambulatory Visit
Admission: RE | Admit: 2014-05-25 | Discharge: 2014-05-25 | Disposition: A | Discharge status: Home / Self Care | Payer: 59 | Source: Ambulatory Visit | Attending: Radiation Oncology | Admitting: Radiation Oncology

## 2014-05-25 DIAGNOSIS — C159 Malignant neoplasm of esophagus, unspecified: Secondary | ICD-10-CM

## 2014-05-25 DIAGNOSIS — Z5111 Encounter for antineoplastic chemotherapy: Secondary | ICD-10-CM

## 2014-05-25 MED ORDER — ONDANSETRON 16 MG/50ML IVPB (CHCC)
INTRAVENOUS | Status: AC
  Filled 2014-05-25: qty 16

## 2014-05-25 MED ORDER — DIPHENHYDRAMINE HCL 50 MG/ML IJ SOLN
INTRAMUSCULAR | Status: AC
  Filled 2014-05-25: qty 1

## 2014-05-25 MED ORDER — DEXAMETHASONE SODIUM PHOSPHATE 10 MG/ML IJ SOLN
10.0000 mg | Freq: Once | INTRAMUSCULAR | Status: AC
  Administered 2014-05-25: 10 mg via INTRAVENOUS

## 2014-05-25 MED ORDER — FAMOTIDINE IN NACL 20-0.9 MG/50ML-% IV SOLN
20.0000 mg | Freq: Once | INTRAVENOUS | Status: AC
  Administered 2014-05-25: 20 mg via INTRAVENOUS

## 2014-05-25 MED ORDER — SODIUM CHLORIDE 0.9 % IV SOLN
225.4000 mg | Freq: Once | INTRAVENOUS | Status: AC
  Administered 2014-05-25: 230 mg via INTRAVENOUS
  Filled 2014-05-25: qty 23

## 2014-05-25 MED ORDER — FAMOTIDINE IN NACL 20-0.9 MG/50ML-% IV SOLN
INTRAVENOUS | Status: AC
  Filled 2014-05-25: qty 50

## 2014-05-25 MED ORDER — PACLITAXEL CHEMO INJECTION 300 MG/50ML
50.0000 mg/m2 | Freq: Once | INTRAVENOUS | Status: AC
  Administered 2014-05-25: 102 mg via INTRAVENOUS
  Filled 2014-05-25: qty 17

## 2014-05-25 MED ORDER — DIPHENHYDRAMINE HCL 50 MG/ML IJ SOLN
25.0000 mg | Freq: Once | INTRAMUSCULAR | Status: AC
  Administered 2014-05-25: 25 mg via INTRAVENOUS

## 2014-05-25 MED ORDER — DEXAMETHASONE SODIUM PHOSPHATE 10 MG/ML IJ SOLN
INTRAMUSCULAR | Status: AC
  Filled 2014-05-25: qty 1

## 2014-05-25 MED ORDER — ONDANSETRON 16 MG/50ML IVPB (CHCC)
16.0000 mg | Freq: Once | INTRAVENOUS | Status: AC
  Administered 2014-05-25: 16 mg via INTRAVENOUS

## 2014-05-25 MED ORDER — SODIUM CHLORIDE 0.9 % IV SOLN
Freq: Once | INTRAVENOUS | Status: AC
  Administered 2014-05-25: 12:00:00 via INTRAVENOUS

## 2014-05-25 NOTE — Patient Instructions (Signed)
[facility] Discharge Instructions for Patients Receiving Chemotherapy  Today you received the following chemotherapy agents Taxol/Carboplatin  To help prevent nausea and vomiting after your treatment, we encourage you to take your nausea medication  as directed   If you develop nausea and vomiting that is not controlled by your nausea medication, call the clinic.   BELOW ARE SYMPTOMS THAT SHOULD BE REPORTED IMMEDIATELY:  *FEVER GREATER THAN 100.5 F  *CHILLS WITH OR WITHOUT FEVER  NAUSEA AND VOMITING THAT IS NOT CONTROLLED WITH YOUR NAUSEA MEDICATION  *UNUSUAL SHORTNESS OF BREATH  *UNUSUAL BRUISING OR BLEEDING  TENDERNESS IN MOUTH AND THROAT WITH OR WITHOUT PRESENCE OF ULCERS  *URINARY PROBLEMS  *BOWEL PROBLEMS  UNUSUAL RASH Items with * indicate a potential emergency and should be followed up as soon as possible.  Feel free to call the clinic you have any questions or concerns. The clinic phone number is [phone].

## 2014-05-25 NOTE — Progress Notes (Signed)
[place] Line removed, without difficulty, Vaseline guaze pressure dressing applie. Tip intact length of Picc line 40cm. Pt supine x 30 mins. gave pt instructions on care for dressing, no shower x24hrs, pt to reinforce dressing in bleeding and call MD. Pt verbalized understanding and instructions. No further concerns.

## 2014-05-25 NOTE — Progress Notes (Signed)
OK to treat with CBC results from 05/18/14 per Dr. Benay Spice

## 2014-05-26 ENCOUNTER — Ambulatory Visit
Admission: RE | Admit: 2014-05-26 | Discharge: 2014-05-26 | Disposition: A | Discharge status: Home / Self Care | Payer: 59 | Source: Ambulatory Visit | Attending: Radiation Oncology | Admitting: Radiation Oncology

## 2014-05-26 DIAGNOSIS — C159 Malignant neoplasm of esophagus, unspecified: Secondary | ICD-10-CM | POA: Diagnosis not present

## 2014-05-27 ENCOUNTER — Encounter: Payer: BC Managed Care – PPO | Admitting: Nutrition

## 2014-05-27 ENCOUNTER — Ambulatory Visit
Admission: RE | Admit: 2014-05-27 | Discharge: 2014-05-27 | Disposition: A | Discharge status: Home / Self Care | Payer: 59 | Source: Ambulatory Visit | Attending: Radiation Oncology | Admitting: Radiation Oncology

## 2014-05-27 ENCOUNTER — Ambulatory Visit: Payer: 59

## 2014-05-27 ENCOUNTER — Encounter: Payer: Self-pay | Admitting: Radiation Oncology

## 2014-05-27 VITALS — BP 102/69 | HR 81 | Temp 97.4°F | Resp 18 | Ht 71.0 in | Wt 176.2 lb

## 2014-05-27 DIAGNOSIS — C159 Malignant neoplasm of esophagus, unspecified: Secondary | ICD-10-CM | POA: Diagnosis not present

## 2014-05-27 DIAGNOSIS — C154 Malignant neoplasm of middle third of esophagus: Secondary | ICD-10-CM

## 2014-05-27 NOTE — Progress Notes (Signed)
Weekly radiation to esophagus, completed 20 treatments today. Patient reports improved swallowing but still occasionally painful at times. Pt now able to take whole meals by mouth, not requiring any osmolite starting yesterday, 05/26/14. Patient has a large appetite but feels full quickly. Skin to throat and chest red, itching and occasionally tender, uses biafine cream with relief. Patient denies cough, chest pain, or shortness of breath.

## 2014-05-27 NOTE — Progress Notes (Signed)
   Department of Radiation Oncology  Phone:  [phone] Fax:        [phone]  Weekly Treatment Note    Name: Billy Romero Date: 05/27/2014 MRN: [id number] DOB: [birth year]   Current dose: 36 Gy  Current fraction: 20   MEDICATIONS: Current Outpatient Prescriptions  Medication Sig Dispense Refill  . baclofen (LIORESAL) 10 MG tablet Take 10 mg by mouth 3 (three) times daily as needed for muscle spasms.   2  . emollient (BIAFINE) cream Apply 1 application topically 2 (two) times daily. Apply to affected skin area after rad tx and bedtime daily+    . Nutritional Supplements (FEEDING SUPPLEMENT, OSMOLITE 1.5 CAL,) LIQD Place 1,000 mLs into feeding tube daily.  0  . oxyCODONE (ROXICODONE) 5 MG/5ML solution Take 5 mLs (5 mg total) by mouth every 4 (four) hours as needed for severe pain (via peg tube). 150 mL 0  . promethazine (PHENERGAN) 6.25 MG/5ML syrup Take 10 mLs (12.5 mg total) by mouth every 6 (six) hours as needed for nausea or vomiting. 120 mL 3  . sucralfate (CARAFATE) 1 G tablet Take 1 tablet (1 g total) by mouth 4 (four) times daily. 120 tablet 2  . hyaluronate sodium (RADIAPLEXRX) GEL Apply 1 application topically daily.    Marland Kitchen PRESCRIPTION MEDICATION Chem [facility]    . prochlorperazine (COMPAZINE) 10 MG tablet Take 1 tablet (10 mg total) by mouth every 6 (six) hours as needed for nausea or vomiting. (Patient not taking: Reported on 05/27/2014) 20 tablet 1   No current facility-administered medications for this encounter.     ALLERGIES: Review of patient's allergies indicates no known allergies.   LABORATORY DATA:  Lab Results  Component Value Date   WBC 2.9* 05/18/2014   HGB 10.9* 05/18/2014   HCT 33.2* 05/18/2014   MCV 87.7 05/18/2014   PLT 138* 05/18/2014   Lab Results  Component Value Date   NA 139 05/18/2014   K 4.3 05/18/2014   CL 107 04/21/2014   CO2 28 05/18/2014   Lab Results  Component Value Date   ALT 40 05/18/2014   AST 31 05/18/2014   ALKPHOS  98 05/18/2014   BILITOT 0.47 05/18/2014     NARRATIVE: Billy Romero was seen today for weekly treatment management. The chart was checked and the patient's films were reviewed.  Weekly radiation to esophagus, completed 20 treatments today. Patient reports improved swallowing but still occasionally painful at times. Pt now able to take whole meals by mouth, not requiring any osmolite starting yesterday, 05/26/14. Patient has a large appetite but feels full quickly. Skin to throat and chest red, itching and occasionally tender, uses biafine cream with relief. Patient denies cough, chest pain, or shortness of breath.   PHYSICAL EXAMINATION: height is 5\' 11"  (1.803 m) and weight is 176 lb 3.2 oz (79.924 kg). His oral temperature is 97.4 F (36.3 C). His blood pressure is 102/69 and his pulse is 81. His respiration is 18.        ASSESSMENT: The patient is doing satisfactorily with treatment.  PLAN: We will continue with the patient's radiation treatment as planned. The patient will begin using Carafate as well as a acid reducer.

## 2014-05-30 ENCOUNTER — Ambulatory Visit
Admission: RE | Admit: 2014-05-30 | Discharge: 2014-05-30 | Disposition: A | Discharge status: Home / Self Care | Payer: 59 | Source: Ambulatory Visit | Attending: Radiation Oncology | Admitting: Radiation Oncology

## 2014-05-30 ENCOUNTER — Ambulatory Visit (HOSPITAL_COMMUNITY)
Admission: RE | Admit: 2014-05-30 | Discharge: 2014-05-30 | Disposition: A | Discharge status: Home / Self Care | Payer: 59 | Source: Ambulatory Visit | Attending: Radiation Oncology | Admitting: Radiation Oncology

## 2014-05-30 ENCOUNTER — Other Ambulatory Visit: Payer: Self-pay | Admitting: Radiation Oncology

## 2014-05-30 DIAGNOSIS — K9423 Gastrostomy malfunction: Secondary | ICD-10-CM

## 2014-05-30 DIAGNOSIS — Z8501 Personal history of malignant neoplasm of esophagus: Secondary | ICD-10-CM | POA: Insufficient documentation

## 2014-05-30 DIAGNOSIS — C154 Malignant neoplasm of middle third of esophagus: Secondary | ICD-10-CM

## 2014-05-30 DIAGNOSIS — Z434 Encounter for attention to other artificial openings of digestive tract: Secondary | ICD-10-CM | POA: Diagnosis present

## 2014-05-30 DIAGNOSIS — C159 Malignant neoplasm of esophagus, unspecified: Secondary | ICD-10-CM | POA: Diagnosis not present

## 2014-05-30 NOTE — Progress Notes (Signed)
Patient came to nursing room 10, stated Peg tube is coming out, suture undone, he wants it pulled out,doesn't want to get nauseated if he uses it again, hasn't used in a few days, informed him that MD will see him and he would need to write order for IR radiology to either suture back in or to take out 10:57 AM

## 2014-05-30 NOTE — Addendum Note (Signed)
Encounter addended by: Jacqulyn Liner, RN on: 05/30/2014 12:19 PM<BR>     Documentation filed: Orders

## 2014-05-30 NOTE — Progress Notes (Signed)
Juliann Pulse in IR called,  And feels that since patient desires to have his peg tube pulled out, they would rather  Do as he wishes, he completes radiation in a week or so and he is eating without difficulty, informed her that per Dr.Moody it was okay it that was his real desire after talking with Interventional  Radiaology, "Okay, we will pull the peg tube then not exchange it as he had a traumatic experience before when it was placed" 2:31 PM'

## 2014-05-30 NOTE — Progress Notes (Signed)
Department of Radiation Oncology  Phone:  [phone] Fax:        [phone]  Weekly Treatment Note    Name: Billy Romero Date: 05/30/2014 MRN: [id number] DOB: [birth year]   Current dose: 37.8 Gy  Current fraction: 21   MEDICATIONS: Current Outpatient Prescriptions  Medication Sig Dispense Refill  . baclofen (LIORESAL) 10 MG tablet Take 10 mg by mouth 3 (three) times daily as needed for muscle spasms.   2  . emollient (BIAFINE) cream Apply 1 application topically 2 (two) times daily. Apply to affected skin area after rad tx and bedtime daily+    . hyaluronate sodium (RADIAPLEXRX) GEL Apply 1 application topically daily.    . Nutritional Supplements (FEEDING SUPPLEMENT, OSMOLITE 1.5 CAL,) LIQD Place 1,000 mLs into feeding tube daily.  0  . oxyCODONE (ROXICODONE) 5 MG/5ML solution Take 5 mLs (5 mg total) by mouth every 4 (four) hours as needed for severe pain (via peg tube). 150 mL 0  . PRESCRIPTION MEDICATION Chem [facility]    . prochlorperazine (COMPAZINE) 10 MG tablet Take 1 tablet (10 mg total) by mouth every 6 (six) hours as needed for nausea or vomiting. (Patient not taking: Reported on 05/27/2014) 20 tablet 1  . promethazine (PHENERGAN) 6.25 MG/5ML syrup Take 10 mLs (12.5 mg total) by mouth every 6 (six) hours as needed for nausea or vomiting. 120 mL 3  . sucralfate (CARAFATE) 1 G tablet Take 1 tablet (1 g total) by mouth 4 (four) times daily. 120 tablet 2   No current facility-administered medications for this encounter.     ALLERGIES: Review of patient's allergies indicates no known allergies.   LABORATORY DATA:  Lab Results  Component Value Date   WBC 2.9* 05/18/2014   HGB 10.9* 05/18/2014   HCT 33.2* 05/18/2014   MCV 87.7 05/18/2014   PLT 138* 05/18/2014   Lab Results  Component Value Date   NA 139 05/18/2014   K 4.3 05/18/2014   CL 107 04/21/2014   CO2 28 05/18/2014   Lab Results  Component Value Date   ALT 40 05/18/2014   AST 31 05/18/2014   ALKPHOS 98 05/18/2014   BILITOT 0.47 05/18/2014     NARRATIVE: [name] was seen today for weekly treatment management. The chart was checked and the patient's films were reviewed.  The patient states that the suture for his feeding tube came undone. The feeding tube has pulled out some. He has not used it for the last several days. He has been able to swallow quite well over the last 1-2 weeks which is Ace considerable improvement since he began treatment. However, he is beginning to experience some esophagitis which is making it more difficult to swallow.  PHYSICAL EXAMINATION: vitals were not taken for this visit.     the feeding tube has pulled out approximately 5 cm. No sign of infection.  ASSESSMENT: The patient is doing satisfactorily with treatment. He has an issue with his feeding tube with the suture having come undone. He needs to be seen in interventional radiology and they do have a spot for him this afternoon. I discussed with the patient that it would certainly be very nice to have this in place over the next 6-8 weeks to help with nutrition. He is going to experience more esophagitis towards the end of treatment and this will take several weeks to resolve. Notably however, the patient has been able to markedly improve his oral intake since he began treatment.  PLAN: We will continue with the patient's radiation treatment as planned. The patient will be seen in interventional radiology regarding the feeding tube issue.

## 2014-05-30 NOTE — Addendum Note (Signed)
Encounter addended by: Rebecca Eaton, RN on: 05/30/2014  2:31 PM<BR>     Documentation filed: Notes Section

## 2014-05-30 NOTE — Progress Notes (Signed)
Patient to go to  IR radiology time 2pm, arrive 1345pm to register, for peg tube check,  11:16 AM

## 2014-05-30 NOTE — Procedures (Signed)
Per request from pt and following d/w Dr. Ida Rogue office, pt's jejunostomy /red robin catheter was removed in its entirety without immediate complications. Gauze dressing applied to site.

## 2014-05-31 ENCOUNTER — Ambulatory Visit
Admission: RE | Admit: 2014-05-31 | Discharge: 2014-05-31 | Disposition: A | Discharge status: Home / Self Care | Payer: 59 | Source: Ambulatory Visit | Attending: Radiation Oncology | Admitting: Radiation Oncology

## 2014-05-31 DIAGNOSIS — C159 Malignant neoplasm of esophagus, unspecified: Secondary | ICD-10-CM | POA: Diagnosis not present

## 2014-06-01 ENCOUNTER — Ambulatory Visit: Admission: RE | Admit: 2014-06-01 | Payer: 59 | Source: Ambulatory Visit | Admitting: Radiation Oncology

## 2014-06-01 ENCOUNTER — Ambulatory Visit: Payer: 59

## 2014-06-01 ENCOUNTER — Ambulatory Visit
Admission: RE | Admit: 2014-06-01 | Discharge: 2014-06-01 | Disposition: A | Discharge status: Home / Self Care | Payer: 59 | Source: Ambulatory Visit | Attending: Radiation Oncology | Admitting: Radiation Oncology

## 2014-06-01 ENCOUNTER — Encounter: Payer: BC Managed Care – PPO | Admitting: Nutrition

## 2014-06-01 DIAGNOSIS — C159 Malignant neoplasm of esophagus, unspecified: Secondary | ICD-10-CM | POA: Diagnosis not present

## 2014-06-02 ENCOUNTER — Ambulatory Visit
Admission: RE | Admit: 2014-06-02 | Discharge: 2014-06-02 | Disposition: A | Discharge status: Home / Self Care | Payer: 59 | Source: Ambulatory Visit | Attending: Radiation Oncology | Admitting: Radiation Oncology

## 2014-06-02 ENCOUNTER — Ambulatory Visit: Payer: 59

## 2014-06-02 DIAGNOSIS — C159 Malignant neoplasm of esophagus, unspecified: Secondary | ICD-10-CM | POA: Diagnosis not present

## 2014-06-03 ENCOUNTER — Ambulatory Visit
Admission: RE | Admit: 2014-06-03 | Discharge: 2014-06-03 | Disposition: A | Discharge status: Home / Self Care | Payer: 59 | Source: Ambulatory Visit | Attending: Radiation Oncology | Admitting: Radiation Oncology

## 2014-06-03 ENCOUNTER — Ambulatory Visit: Payer: 59

## 2014-06-03 DIAGNOSIS — C159 Malignant neoplasm of esophagus, unspecified: Secondary | ICD-10-CM | POA: Diagnosis not present

## 2014-06-06 ENCOUNTER — Ambulatory Visit
Admission: RE | Admit: 2014-06-06 | Discharge: 2014-06-06 | Disposition: A | Discharge status: Home / Self Care | Payer: 59 | Source: Ambulatory Visit | Attending: Radiation Oncology | Admitting: Radiation Oncology

## 2014-06-06 ENCOUNTER — Ambulatory Visit: Payer: 59

## 2014-06-06 DIAGNOSIS — C159 Malignant neoplasm of esophagus, unspecified: Secondary | ICD-10-CM | POA: Diagnosis not present

## 2014-06-07 ENCOUNTER — Ambulatory Visit
Admission: RE | Admit: 2014-06-07 | Discharge: 2014-06-07 | Disposition: A | Discharge status: Home / Self Care | Payer: 59 | Source: Ambulatory Visit | Attending: Radiation Oncology | Admitting: Radiation Oncology

## 2014-06-07 ENCOUNTER — Other Ambulatory Visit: Payer: Self-pay | Admitting: *Deleted

## 2014-06-07 ENCOUNTER — Ambulatory Visit: Payer: 59

## 2014-06-07 DIAGNOSIS — C159 Malignant neoplasm of esophagus, unspecified: Secondary | ICD-10-CM | POA: Diagnosis not present

## 2014-06-07 MED ORDER — OXYCODONE HCL 5 MG/5ML PO SOLN: 5.0000 mg | ORAL | Status: DC | PRN

## 2014-06-07 NOTE — Telephone Encounter (Signed)
Left message on voicemail for pt that prescription may be picked up at front desk.

## 2014-06-08 ENCOUNTER — Ambulatory Visit: Payer: 59

## 2014-06-08 ENCOUNTER — Ambulatory Visit
Admission: RE | Admit: 2014-06-08 | Discharge: 2014-06-08 | Disposition: A | Discharge status: Home / Self Care | Payer: 59 | Source: Ambulatory Visit | Attending: Radiation Oncology | Admitting: Radiation Oncology

## 2014-06-08 ENCOUNTER — Encounter: Payer: BC Managed Care – PPO | Admitting: Nutrition

## 2014-06-08 ENCOUNTER — Encounter: Payer: Self-pay | Admitting: Nutrition

## 2014-06-08 ENCOUNTER — Ambulatory Visit (HOSPITAL_BASED_OUTPATIENT_CLINIC_OR_DEPARTMENT_OTHER): Payer: 59 | Admitting: Nurse Practitioner

## 2014-06-08 ENCOUNTER — Telehealth: Payer: Self-pay | Admitting: Nurse Practitioner

## 2014-06-08 ENCOUNTER — Other Ambulatory Visit (HOSPITAL_BASED_OUTPATIENT_CLINIC_OR_DEPARTMENT_OTHER): Payer: 59

## 2014-06-08 VITALS — BP 105/66 | HR 107 | Temp 97.6°F | Resp 18 | Ht 71.0 in | Wt 173.6 lb

## 2014-06-08 DIAGNOSIS — C159 Malignant neoplasm of esophagus, unspecified: Secondary | ICD-10-CM

## 2014-06-08 DIAGNOSIS — D709 Neutropenia, unspecified: Secondary | ICD-10-CM

## 2014-06-08 DIAGNOSIS — R1310 Dysphagia, unspecified: Secondary | ICD-10-CM

## 2014-06-08 DIAGNOSIS — C154 Malignant neoplasm of middle third of esophagus: Secondary | ICD-10-CM

## 2014-06-08 LAB — CBC WITH DIFFERENTIAL/PLATELET
BASO%: 0.5 % (ref 0.0–2.0)
Basophils Absolute: 0 10*3/uL (ref 0.0–0.1)
EOS ABS: 0 10*3/uL (ref 0.0–0.5)
EOS%: 0.5 % (ref 0.0–7.0)
HEMATOCRIT: 32.9 % — AB (ref 38.4–49.9)
HGB: 11 g/dL — ABNORMAL LOW (ref 13.0–17.1)
LYMPH#: 0.3 10*3/uL — AB (ref 0.9–3.3)
LYMPH%: 20.2 % (ref 14.0–49.0)
MCH: 30.1 pg (ref 27.2–33.4)
MCHC: 33.4 g/dL (ref 32.0–36.0)
MCV: 90 fL (ref 79.3–98.0)
MONO#: 0.4 10*3/uL (ref 0.1–0.9)
MONO%: 21.4 % — AB (ref 0.0–14.0)
NEUT%: 57.4 % (ref 39.0–75.0)
NEUTROS ABS: 1 10*3/uL — AB (ref 1.5–6.5)
Platelets: 292 10*3/uL (ref 140–400)
RBC: 3.66 10*6/uL — AB (ref 4.20–5.82)
RDW: 17.9 % — ABNORMAL HIGH (ref 11.0–14.6)
WBC: 1.7 10*3/uL — AB (ref 4.0–10.3)

## 2014-06-08 LAB — COMPREHENSIVE METABOLIC PANEL (CC13)
ALBUMIN: 3.3 g/dL — AB (ref 3.5–5.0)
ALK PHOS: 107 U/L (ref 40–150)
ALT: 24 U/L (ref 0–55)
AST: 21 U/L (ref 5–34)
Anion Gap: 8 mEq/L (ref 3–11)
BUN: 8.6 mg/dL (ref 7.0–26.0)
CO2: 28 mEq/L (ref 22–29)
Calcium: 9.4 mg/dL (ref 8.4–10.4)
Chloride: 106 mEq/L (ref 98–109)
Creatinine: 1 mg/dL (ref 0.7–1.3)
EGFR: 84 mL/min/{1.73_m2} — AB (ref >90)
GLUCOSE: 100 mg/dL (ref 70–140)
Potassium: 4.1 mEq/L (ref 3.5–5.1)
Sodium: 143 mEq/L (ref 136–145)
Total Bilirubin: 0.66 mg/dL (ref 0.20–1.20)
Total Protein: 6.1 g/dL — ABNORMAL LOW (ref 6.4–8.3)

## 2014-06-08 NOTE — Progress Notes (Signed)
   Department of Radiation Oncology  Phone:  [phone] Fax:        [phone]  Weekly Treatment Note    Name: Billy Romero Date: 06/08/2014 MRN: [id number] DOB: 1956/03/24   Current dose: 50.4 Gy  Current fraction: 28   MEDICATIONS: Current Outpatient Prescriptions  Medication Sig Dispense Refill  . emollient (BIAFINE) cream Apply 1 application topically 2 (two) times daily. Apply to affected skin area after rad tx and bedtime daily+    . hyaluronate sodium (RADIAPLEXRX) GEL Apply 1 application topically daily.    Marland Kitchen oxyCODONE (ROXICODONE) 5 MG/5ML solution Take 5 mLs (5 mg total) by mouth every 4 (four) hours as needed for severe pain (via peg tube). 150 mL 0  . PRESCRIPTION MEDICATION Chem [facility]    . prochlorperazine (COMPAZINE) 10 MG tablet Take 1 tablet (10 mg total) by mouth every 6 (six) hours as needed for nausea or vomiting. 20 tablet 1  . promethazine (PHENERGAN) 6.25 MG/5ML syrup Take 10 mLs (12.5 mg total) by mouth every 6 (six) hours as needed for nausea or vomiting. 120 mL 3  . sucralfate (CARAFATE) 1 G tablet Take 1 tablet (1 g total) by mouth 4 (four) times daily. 120 tablet 2   No current facility-administered medications for this encounter.     ALLERGIES: Review of patient's allergies indicates no known allergies.   LABORATORY DATA:  Lab Results  Component Value Date   WBC 1.7* 06/08/2014   HGB 11.0* 06/08/2014   HCT 32.9* 06/08/2014   MCV 90.0 06/08/2014   PLT 292 06/08/2014   Lab Results  Component Value Date   NA 143 06/08/2014   K 4.1 06/08/2014   CL 107 04/21/2014   CO2 28 06/08/2014   Lab Results  Component Value Date   ALT 24 06/08/2014   AST 21 06/08/2014   ALKPHOS 107 06/08/2014   BILITOT 0.66 06/08/2014     NARRATIVE: Billy Romero was seen today for weekly treatment management. The chart was checked and the patient's films were reviewed.  The patient states that he continues to swallow well. He does complain of some  beginning of some esophagitis. This has been manageable and overall he is pleased with how he is swallowing. He feels that he has maybe gained a little weight.  PHYSICAL EXAMINATION: vitals were not taken for this visit.     alert, in no acute distress  ASSESSMENT: The patient is doing satisfactorily with treatment.  PLAN: We will continue with the patient's radiation treatment as planned. The patient will finish his treatment later this week. I will see him for routine follow-up in one month.

## 2014-06-08 NOTE — Telephone Encounter (Signed)
Gave avs & calendar for March. °

## 2014-06-08 NOTE — Progress Notes (Signed)
I was unable to complete nutrition follow up because patient did not have chemotherapy as schedule indicated. Noted patient's feeding tube has been removed and patient is eating well and maintaining weight.. Patient will be referred for surgery. Patient may be eligible for Impact AR Trial. Will contact patient by phone.

## 2014-06-08 NOTE — Progress Notes (Signed)
  [facility] OFFICE PROGRESS NOTE   Diagnosis:  Esophagus cancer  INTERVAL HISTORY:   Mr. Hargens returns as scheduled. He completed the fifth weekly Taxol/carboplatin on 05/25/2014. He continues radiation. He has occasional mild nausea. No vomiting. No diarrhea or constipation. The feeding tube has been removed. He is tolerating liquids and soft foods. The dysphagia continues to be improved. He notes some pain with swallowing. No fever. He is recovering from a "sinus infection".  Objective:  Vital signs in last 24 hours:  Blood pressure 105/66, pulse 107, temperature 97.6 F (36.4 C), temperature source Oral, resp. rate 18, height 5\' 11"  (1.803 m), weight 173 lb 9.6 oz (78.744 kg), SpO2 99 %.    HEENT: No thrush or ulcers. Lymphatics: No palpable cervical or supraclavicular lymph nodes. Resp: Bronchial breath sounds at the upper posterior chest. No respiratory distress. Cardio: Regular rate and rhythm. GI: Abdomen soft and nontender. No hepatomegaly. Vascular: No leg edema. Skin: Radiation dermatitis upper/mid back.    Lab Results:  Lab Results  Component Value Date   WBC 1.7* 06/08/2014   HGB 11.0* 06/08/2014   HCT 32.9* 06/08/2014   MCV 90.0 06/08/2014   PLT 292 06/08/2014   NEUTROABS 1.0* 06/08/2014    Imaging:  No results found.  Medications: I have reviewed the patient's current medications.  Assessment/Plan: 1. 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  2. Solid/liquid dysphagia secondary to #1  Jejunostomy feeding tube placement 04/07/2014  Feeding tube removed 05/30/2014.  3. Small bowel obstruction following  placement of the jejunostomy feeding tube-resolved  4. History of ulcerative colitis  5. Aspiration pneumonia December 2015   Disposition: Mr. Celaya appears stable. He completed 5 weekly treatments with Taxol/carboplatin. He is scheduled to complete the course of radiation on 06/10/2014. We are referring him for a restaging PET scan in approximately 3 weeks. We also referred him back to Dr. Servando Snare to discuss surgery. He will return for a follow-up visit here in 4 weeks.   We reviewed his labs from today. He has mild neutropenia. He understands to contact the office with fever, chills, other signs of infection. We will obtain a follow-up CBC in one week.  Plan reviewed with Dr. Benay Spice.  Ned Card ANP/GNP-BC   06/08/2014  9:45 AM

## 2014-06-08 NOTE — Addendum Note (Signed)
Encounter addended by: Jodelle Gross, MD on: 06/08/2014  8:54 PM<BR>     Documentation filed: Notes Section, Visit Diagnoses

## 2014-06-08 NOTE — Progress Notes (Signed)
  Radiation Oncology         [phone]) [phone] ________________________________  Name: MAYRA BRAHM MRN: [id number]  Date: 05/06/2014  DOB: 01-Apr-1956  SIMULATION AND TREATMENT PLANNING NOTE  DIAGNOSIS:  Esophageal cancer  NARRATIVE:  The patient was underwent a complex simulation. The patient has initially been planned to receive 9 Gray in 5 fractions using AP and PA fields. The patient now will receive and continue with additional treatment using a IMRT technique. This is necessary due to the close proximity of the target with critical normal structures including the heart and spinal cord in particular. The patient therefore will receive an additional 45 gray in 25 fractions in this fashion. It is anticipated that the patient will therefore receive a total of 54 gray. Concurrent chemotherapy will be used during treatment.  The patient will undergo daily image guidance to ensure accurate localization of the target, and adequate minimize dose to the normal surrounding structures in close proximity to the target.   ________________________________   Jodelle Gross, MD, PhD

## 2014-06-08 NOTE — Progress Notes (Addendum)
  Radiation Oncology         [phone]) [phone] ________________________________  Name: Billy Romero MRN: [id number]  Date: 04/13/2014  DOB: November 21, 1955  SIMULATION AND TREATMENT PLANNING NOTE  DIAGNOSIS:  Esophageal cancer  NARRATIVE:  The patient was brought to the [facility] suite.  Identity was confirmed.  All relevant records and images related to the planned course of therapy were reviewed.   Written consent to proceed with treatment was confirmed which was freely given after reviewing the details related to the planned course of therapy had been reviewed with the patient.  Then, the patient was set-up in a stable reproducible  supine position for radiation therapy.  CT images were obtained.  Surface markings were placed.    Medically necessary complex treatment device(s) for immobilization:  Customized vac lock bag.   The CT images were loaded into the planning software.  Then the target and avoidance structures were contoured.  Treatment planning then occurred.  The radiation prescription was entered and confirmed.  A total of 2 complex treatment devices were fabricated which relate to the designed radiation treatment fields which will correspond to the patient's initial treatment. These consist of AP and PA fields. Each of these customized fields/ complex treatment devices will be used on a daily basis during the radiation course. I have requested : Isodose Plan.   The patient will undergo daily image guidance to ensure accurate localization of the target, and adequate minimize dose to the normal surrounding structures in close proximity to the target.   PLAN:  The patient will receive 9 Gy in 5 fractions initially. The patient will then proceed with additional treatment using a IMRT technique on our tomotherapy unit. It is anticipated that the patient will then receive an additional 45 gray.   Special treatment procedure The patient will also receive concurrent chemotherapy  during the treatment. The patient may therefore experience increased toxicity or side effects and the patient will be monitored for such problems. This may require extra lab work as necessary. This therefore constitutes a special treatment procedure.   ________________________________     Jodelle Gross, MD, PhD

## 2014-06-09 ENCOUNTER — Ambulatory Visit: Payer: 59

## 2014-06-09 ENCOUNTER — Ambulatory Visit
Admission: RE | Admit: 2014-06-09 | Discharge: 2014-06-09 | Disposition: A | Discharge status: Home / Self Care | Payer: 59 | Source: Ambulatory Visit | Attending: Radiation Oncology | Admitting: Radiation Oncology

## 2014-06-09 ENCOUNTER — Ambulatory Visit: Payer: 59 | Admitting: Radiation Oncology

## 2014-06-09 ENCOUNTER — Telehealth: Payer: Self-pay | Admitting: Oncology

## 2014-06-09 DIAGNOSIS — C159 Malignant neoplasm of esophagus, unspecified: Secondary | ICD-10-CM | POA: Diagnosis not present

## 2014-06-09 NOTE — Telephone Encounter (Signed)
Faxed records to Dr. Servando Snare

## 2014-06-10 ENCOUNTER — Encounter: Payer: Self-pay | Admitting: Radiation Oncology

## 2014-06-10 ENCOUNTER — Ambulatory Visit
Admission: RE | Admit: 2014-06-10 | Discharge: 2014-06-10 | Disposition: A | Discharge status: Home / Self Care | Payer: 59 | Source: Ambulatory Visit | Attending: Radiation Oncology | Admitting: Radiation Oncology

## 2014-06-10 DIAGNOSIS — C159 Malignant neoplasm of esophagus, unspecified: Secondary | ICD-10-CM | POA: Diagnosis not present

## 2014-06-15 ENCOUNTER — Other Ambulatory Visit (HOSPITAL_BASED_OUTPATIENT_CLINIC_OR_DEPARTMENT_OTHER): Payer: 59

## 2014-06-15 ENCOUNTER — Telehealth: Payer: Self-pay | Admitting: *Deleted

## 2014-06-15 DIAGNOSIS — C159 Malignant neoplasm of esophagus, unspecified: Secondary | ICD-10-CM

## 2014-06-15 LAB — CBC WITH DIFFERENTIAL/PLATELET
BASO%: 0.2 % (ref 0.0–2.0)
Basophils Absolute: 0 10*3/uL (ref 0.0–0.1)
EOS ABS: 0 10*3/uL (ref 0.0–0.5)
EOS%: 0.2 % (ref 0.0–7.0)
HCT: 35.5 % — ABNORMAL LOW (ref 38.4–49.9)
HEMOGLOBIN: 12.1 g/dL — AB (ref 13.0–17.1)
LYMPH#: 0.5 10*3/uL — AB (ref 0.9–3.3)
LYMPH%: 10.4 % — ABNORMAL LOW (ref 14.0–49.0)
MCH: 30.9 pg (ref 27.2–33.4)
MCHC: 34.1 g/dL (ref 32.0–36.0)
MCV: 90.8 fL (ref 79.3–98.0)
MONO#: 0.6 10*3/uL (ref 0.1–0.9)
MONO%: 13.4 % (ref 0.0–14.0)
NEUT%: 75.8 % — ABNORMAL HIGH (ref 39.0–75.0)
NEUTROS ABS: 3.5 10*3/uL (ref 1.5–6.5)
PLATELETS: 256 10*3/uL (ref 140–400)
RBC: 3.91 10*6/uL — ABNORMAL LOW (ref 4.20–5.82)
RDW: 18.2 % — ABNORMAL HIGH (ref 11.0–14.6)
WBC: 4.6 10*3/uL (ref 4.0–10.3)

## 2014-06-15 NOTE — Telephone Encounter (Signed)
Pt notified his WBC count was better this last visit.  Pt states he thinks it was elevated last time because he was fighting a sinus infection.

## 2014-06-15 NOTE — Telephone Encounter (Signed)
-----   Message from Bevelyn Ngo, LPN sent at 0/72/1828  1:49 PM EST -----   ----- Message -----    From: Owens Shark, NP    Sent: 06/15/2014   1:10 PM      To: Tiffany A Hill, LPN  Please let him know the white count is better.

## 2014-06-17 ENCOUNTER — Telehealth: Payer: Self-pay | Admitting: *Deleted

## 2014-06-17 ENCOUNTER — Other Ambulatory Visit: Payer: Self-pay | Admitting: *Deleted

## 2014-06-17 DIAGNOSIS — C159 Malignant neoplasm of esophagus, unspecified: Secondary | ICD-10-CM

## 2014-06-17 MED ORDER — AZITHROMYCIN 250 MG PO TABS
ORAL_TABLET | ORAL | Status: DC
Start: 2014-06-17 — End: 2014-06-22

## 2014-06-17 NOTE — Telephone Encounter (Signed)
Received voice mail message from patient stating,"I think I've got a sinus infection that has gone to my chest." This RN spoke with patient and he denied nausea, vomiting, diarrhea, fevers, chills and pain. He has a productive cough with greenish phlegm. Per Selena Lesser, NP, we reviewed patient's labs, signs and symptoms, and she has ordered a Z-Pack. I instructed patient to go to the ER over the weekend if he felt worse or he developed chills and fever. Patient verbalized understanding. Patient is eating and drinking adequately.

## 2014-06-22 ENCOUNTER — Ambulatory Visit (INDEPENDENT_AMBULATORY_CARE_PROVIDER_SITE_OTHER): Payer: 59 | Admitting: Cardiothoracic Surgery

## 2014-06-22 ENCOUNTER — Encounter: Payer: Self-pay | Admitting: Cardiothoracic Surgery

## 2014-06-22 VITALS — BP 92/64 | HR 96 | Resp 16 | Ht 71.0 in | Wt 175.0 lb

## 2014-06-22 DIAGNOSIS — C159 Malignant neoplasm of esophagus, unspecified: Secondary | ICD-10-CM

## 2014-06-22 NOTE — Progress Notes (Signed)
Patient ID: Billy Romero, male   DOB: [birth year], 59 y.o.   MRN: [id number]      [address]       [place] [address]             [phone]        Erminio W Dupas [facility] Record #[id number] Date of Birth: [birth year]  Referring:Dr B Sherrill Primary Care: Lujean Amel, MD  Chief Complaint:   Obstructing Squamous Cell Esophageal Cancer  History of Present Illness:     Billy Romero is a 59 y.o. 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  He has had many year history of reflux symptoms, since teenager. He presented in dec 2015 with several months he has had increasing difficulty swallowing. Outpatient Endoscopy done by Dr Earlean Shawl demonstrated obstruction squamous cell ca 28-30 cm. Admitted from [facility] due to inability to eat or drink and dehydration.  He has now completed course of chemotherapy and radiation with significant improvement in swallowing. J tube is now removed. He has repeat pet scan scheduled for next week.  Current Activity/ Functional Status: Patient is independent with mobility/ambulation, transfers, ADL's, IADL's.   Zubrod Score: At the time of surgery this patient's most appropriate activity status/level should be described as: [x]     0    Normal activity, no symptoms []     1    Restricted in physical strenuous activity but ambulatory, able to do out light work []     2    Ambulatory and capable of self care, unable to do work activities, up and about                 more than 50%  Of the time                            []     3    Only limited self care, in bed greater than 50% of waking hours []     4    Completely disabled, no self care, confined to bed or chair []     5    Moribund  Past Medical History  Diagnosis Date  . Esophageal cancer 03/29/14    Past Surgical History  Procedure Laterality Date  . Peg placement N/A 04/06/2014    Procedure: OPEN  JEJUNOSTOMY TUBE PLACEMENT;  Surgeon: Armandina Gemma, MD;  Location: [facility];  Service: General;  Laterality: N/A;   Previous cervical spine surgery vis left anterior cervical approach  History  Smoking status  . Never Smoker   Smokeless tobacco  .  uses dip/snuff for 25-30 years    History  Alcohol Use No  pervious  Heavy alcohol use, treated with in AA, has been sober for past 9 years  History  Family History  father and paternal grandfather died of leukemia one chronic one acute , both at age 52. No other family history of cancer. He has one brother, 1 sister, one half sister, and 2 half-brothers   Social History  . Marital Status: Married    Spouse Name: N/A    Number of Children: 2  . Years of Education: N/A   Occupational History  . Retired June 2015 form [employer]  . Smoking status: Never Smoker   . Smokeless tobacco: Not on file  . Alcohol Use: No  .  Drug Use: Not on file  . Sexual Activity: Not on file      No Known Allergies  Current Outpatient Prescriptions  Medication Sig Dispense Refill  . emollient (BIAFINE) cream Apply 1 application topically 2 (two) times daily. Apply to affected skin area after rad tx and bedtime daily+    . hyaluronate sodium (RADIAPLEXRX) GEL Apply 1 application topically daily.    Marland Kitchen oxyCODONE (ROXICODONE) 5 MG/5ML solution Take 5 mLs (5 mg total) by mouth every 4 (four) hours as needed for severe pain (via peg tube). 150 mL 0  . prochlorperazine (COMPAZINE) 10 MG tablet Take 1 tablet (10 mg total) by mouth every 6 (six) hours as needed for nausea or vomiting. 20 tablet 1  . promethazine (PHENERGAN) 6.25 MG/5ML syrup Take 10 mLs (12.5 mg total) by mouth every 6 (six) hours as needed for nausea or vomiting. 120 mL 3  . sucralfate (CARAFATE) 1 G tablet Take 1 tablet (1 g total) by mouth 4 (four) times daily. 120 tablet 2   No current facility-administered medications for this visit.    No family history  on file.   Review of Systems:     Cardiac Review of Systems: Y or N  Chest Pain [ n   ]  Resting SOB [n   ] Exertional SOB  [n  ]  Orthopnea [ n ]   Pedal Edema [ n  ]    Palpitations [n  ] Syncope  [n  ]   Presyncope [ n  ]  General Review of Systems: [Y] = yes [  ]=no Constitional: recent weight change Blue.Reese  ]; anorexia Blue.Reese  ]; fatigue [  ]; nausea Blue.Reese  ]; night sweats [ n ]; fever [ n ]; or chills [n  ]                                                               Dental: poor dentition[ n ]; Last Dentist visit:   Eye : blurred vision [  ]; diplopia [   ]; vision changes [  ];  Amaurosis fugax[  ]; Resp: cough [  ];  wheezing[n  ];  hemoptysis[ n ]; shortness of breath[n  ]; paroxysmal nocturnal dyspnea[n  ]; dyspnea on exertion[  ]; or orthopnea[  ];  GI:  gallstones[n  ], vomiting[n  ];  dysphagia[  ]; melena[  ];  hematochezia [ n ]; heartburn[  ];   Hx of  Colonoscopy[ y ]; GU: kidney stones [  ]; hematuria[  ];   dysuria [  ];  nocturia[  ];  history of     obstruction [  ]; urinary frequency [  ]             Skin: rash, swelling[  ];, hair loss[  ];  peripheral edema[  ];  or itching[  ]; Musculosketetal: myalgias[  ];  joint swelling[  ];  joint erythema[  ];  joint pain[  ];  back pain[  ];  Heme/Lymph: bruising[ n ];  bleeding[n  ];  anemia[  ];  Neuro: TIA[ n ];  headaches[  ];  stroke[  ];  vertigo[  ];  seizures[  ];   paresthesias[  ];  difficulty walking[  ];  Psych:depression[  ]; anxiety[  ];  Endocrine: diabetes[n  ];  thyroid dysfunction[ n ];  Immunizations: Flu [ n ]; Pneumococcal[n  ];  Other:  Physical Exam: BP 92/64 mmHg  Pulse 96  Resp 16  Ht 5\' 11"  (1.803 m)  Wt 175 lb (79.379 kg)  BMI 24.42 kg/m2  SpO2 98%  General appearance: alert, cooperative and appears stated age Neurologic: intact Heart: regular rate and rhythm, S1, S2 normal, no murmur, click, rub or gallop Abdomen: soft, non-tender; bowel sounds normal; no masses,  no organomegaly Extremities:  extremities normal, atraumatic, no cyanosis or edema and Homans sign is negative, no sign of DVT Wound: feeding j tube placed to left of midline abdomen functioning No cervical or supraclavicular adenopathy Full pedal pulses bilaterial  Diagnostic Studies & Laboratory data:     Recent Radiology Findings:   Ir Gastrostomy Tube Removal  05/30/2014   CLINICAL DATA:  Patient with history of esophageal carcinoma and surgically placed jejunostomy red robin catheter. Patient reports leaking and pain at insertion site as well as retraction of portion of catheter. He is eating solid food and request now received for catheter removal.  EXAM: REMOVAL JEJUNOSTOMY CATHETER  PROCEDURE: Following informed consent the patient was placed supine on a stretcher. Inspection of the jejunostomy catheter revealed partial retraction with detached suture. Using manual traction the catheter was removed in its entirety. Upon removal an additional intact suture around catheter sidehole approximately 3-4 cm from the tip was noted. Patient tolerated the procedure well. A gauze dressing was applied to the site postprocedure.  COMPLICATIONS: None immediate.  IMPRESSION: Successful catheter removal as described above.  Read by: Rowe Robert, PA-C   Electronically Signed   By: Aletta Edouard M.D.   On: 05/30/2014 15:03   PET in 04/2014  CLINICAL DATA: Initial treatment strategy for esophageal carcinoma.  EXAM: NUCLEAR MEDICINE PET SKULL BASE TO THIGH  TECHNIQUE: 10.0 mCi F-18 FDG was injected intravenously. Full-ring PET imaging was performed from the skull base to thigh after the radiotracer. CT data was obtained and used for attenuation correction and anatomic localization.  FASTING BLOOD GLUCOSE: Value: 86 mg/dl  COMPARISON: Chest CT on 04/10/2014 and AP CT on 04/08/2014  FINDINGS: NECK  9 mm left supraclavicular lymph node on image 52/series for is hypermetabolic, with SUV max of 8.9. No other  hypermetabolic cervical lymph nodes identified.  CHEST  Hypermetabolic mass is seen in the mid thoracic esophagus with proximal esophageal dilatation. This mass has SUV max of 24.2. This is consistent with primary esophageal carcinoma.  Hypermetabolic mediastinal lymphadenopathy is seen along the posterior aspect of the esophageal mass and in the high right paratracheal and prevascular regions of the mediastinum. The high right paratracheal lymph node measures 2.3 cm on image 57/series 4 with SUV max of 8.8.  No suspicious pulmonary nodules seen by CT.  ABDOMEN/PELVIS  No abnormal hypermetabolic activity within the liver, pancreas, adrenal glands, or spleen. No hypermetabolic lymph nodes in the abdomen or pelvis. Percutaneous jejunostomy tube noted.  SKELETON  No focal hypermetabolic activity to suggest skeletal metastasis.  IMPRESSION: Hypermetabolic mid thoracic esophageal mass, consistent with primary esophageal carcinoma.  Hypermetabolic metastatic lymphadenopathy in mediastinum and left supraclavicular region.  No evidence of metastatic disease in abdomen or pelvis.   Electronically Signed  By: Earle Gell M.D.  On: 05/03/2014 15:36 I have independently reviewed the above radiology studies  and reviewed the findings with the patient.   Recent Lab Findings: Lab Results  Component Value  Date   WBC 4.6 06/15/2014   HGB 12.1* 06/15/2014   HCT 35.5* 06/15/2014   PLT 256 06/15/2014   GLUCOSE 100 06/08/2014   TRIG 67 04/18/2014   ALT 24 06/08/2014   AST 21 06/08/2014   NA 143 06/08/2014   K 4.1 06/08/2014   CL 107 04/21/2014   CREATININE 1.0 06/08/2014   BUN 8.6 06/08/2014   CO2 28 06/08/2014   TSH 0.523 04/05/2014   Patient has not had EUS, with this limitation suspect clinical  IIIB disease with extensive involvement of mediastinal and left subclavian nodes that would not be in operative field.    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 I discussed with patient and his wife esophageal resection and expectations and risks of surgery. Before making decision about resectability will review repeat pet scan to be done next week. Patient may need EUS to accurately  Stage. Will see back after PET.  I have spent more then 40 min in face to face discussion with patient and wife and total 60 min reviewing films and chart   Grace Isaac MD      [address] [place] [address] Office [phone]   Beeper [phone]  06/22/2014 9:45 PM

## 2014-06-29 ENCOUNTER — Ambulatory Visit (HOSPITAL_COMMUNITY): Payer: 59

## 2014-07-01 ENCOUNTER — Ambulatory Visit (HOSPITAL_COMMUNITY)
Admission: RE | Admit: 2014-07-01 | Discharge: 2014-07-01 | Disposition: A | Discharge status: Home / Self Care | Payer: 59 | Source: Ambulatory Visit | Attending: Nurse Practitioner | Admitting: Nurse Practitioner

## 2014-07-01 DIAGNOSIS — C159 Malignant neoplasm of esophagus, unspecified: Secondary | ICD-10-CM | POA: Insufficient documentation

## 2014-07-01 DIAGNOSIS — R590 Localized enlarged lymph nodes: Secondary | ICD-10-CM | POA: Insufficient documentation

## 2014-07-01 LAB — GLUCOSE, CAPILLARY: Glucose-Capillary: 105 mg/dL — ABNORMAL HIGH (ref 70–99)

## 2014-07-01 MED ORDER — FLUDEOXYGLUCOSE F - 18 (FDG) INJECTION
9.2200 | Freq: Once | INTRAVENOUS | Status: AC | PRN
  Administered 2014-07-01: 9.22 via INTRAVENOUS

## 2014-07-06 ENCOUNTER — Ambulatory Visit (HOSPITAL_BASED_OUTPATIENT_CLINIC_OR_DEPARTMENT_OTHER): Payer: 59 | Admitting: Oncology

## 2014-07-06 ENCOUNTER — Telehealth: Payer: Self-pay | Admitting: Oncology

## 2014-07-06 VITALS — BP 115/62 | HR 79 | Temp 98.2°F | Resp 20 | Ht 71.0 in | Wt 182.5 lb

## 2014-07-06 DIAGNOSIS — K5190 Ulcerative colitis, unspecified, without complications: Secondary | ICD-10-CM

## 2014-07-06 DIAGNOSIS — C154 Malignant neoplasm of middle third of esophagus: Secondary | ICD-10-CM

## 2014-07-06 DIAGNOSIS — C159 Malignant neoplasm of esophagus, unspecified: Secondary | ICD-10-CM

## 2014-07-06 DIAGNOSIS — R1310 Dysphagia, unspecified: Secondary | ICD-10-CM

## 2014-07-06 MED ORDER — PROCHLORPERAZINE MALEATE 10 MG PO TABS: 10.0000 mg | ORAL_TABLET | Freq: Four times a day (QID) | ORAL | Status: DC | PRN

## 2014-07-06 NOTE — Progress Notes (Signed)
  [facility] OFFICE PROGRESS NOTE   Diagnosis: Esophagus cancer  INTERVAL HISTORY:   Billy Romero returns as scheduled. He feels well. Occasional nausea. Good appetite. He is playing golf.  Objective:  Vital signs in last 24 hours:  Blood pressure 115/62, pulse 79, temperature 98.2 F (36.8 C), temperature source Oral, resp. rate 20, height 5\' 11"  (1.803 m), weight 182 lb 8 oz (82.781 kg), SpO2 98 %.    HEENT: Neck without mass Lymphatics: No cervical, supra-clavicular, or axillary nodes Resp: Lungs clear bilaterally Cardio: Regular rate and rhythm GI: No hepatosplenomegaly, nontender, no mass Vascular: No leg edema   Lab Results:  Lab Results  Component Value Date   WBC 4.6 06/15/2014   HGB 12.1* 06/15/2014   HCT 35.5* 06/15/2014   MCV 90.8 06/15/2014   PLT 256 06/15/2014   NEUTROABS 3.5 06/15/2014     Imaging: PET scan 07/01/2014, compared to 05/03/2014-improvement in the hypermetabolism associated with the esophageal mass and mediastinal lymphadenopathy. No evidence of progressive disease. I reviewed the PET images with Billy Romero and his wife.  Medications: I have reviewed the patient's current medications.  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  PET scan 07/01/2014 revealed improvement in the hypermetabolic esophagus mass and mediastinal lymphadenopathy  2. Solid/liquid dysphagia secondary to #1  Jejunostomy feeding tube placement 04/07/2014  Feeding tube removed 05/30/2014.  3. Small bowel obstruction following placement of the jejunostomy feeding tube-resolved  4. History of ulcerative  colitis  5. Aspiration pneumonia December 2015   Disposition:  Billy Romero appears well. The staging PET scan reveals no evidence of distant disease and improvement in the hypermetabolic esophagus mass/mediastinal lymph nodes. I could not appreciate the previously noted supraclavicular node on the current PET scan. I discussed treatment options at length with Billy Romero and his wife. I explained there is evidence for an increased chance of long-term disease-free survival with surgery in addition to chemotherapy/radiation. However there is no guarantee of cure with surgery. He is concerned about the morbidity associated with an esophagectomy. He will see Dr. Servando Snare tomorrow to review the PET scan and discussed surgery.  Billy Romero will return for an office visit here in 3 months. I told him I would be glad to discuss things further if he desires.  Betsy Coder, MD  07/06/2014  9:31 AM

## 2014-07-06 NOTE — Telephone Encounter (Signed)
Pt confirmed MD visit per 03/09 POF, gave pt AVS.... KJ

## 2014-07-07 ENCOUNTER — Ambulatory Visit: Payer: 59 | Admitting: Cardiothoracic Surgery

## 2014-07-19 NOTE — Progress Notes (Signed)
  Radiation Oncology         [phone]) [phone] ________________________________  Name: Billy Romero MRN: [id number]  Date: 06/10/2014  DOB: 10-04-1955  End of Treatment Note  Diagnosis:   Esophageal cancer     Indication for treatment:  Curative       Radiation treatment dates:   04/27/2014 through 06/10/2014  Site/dose:   The patient was treated to the lower chest/upper abdomen. He initially received 9 Gray using a 2 field technique. He then received a 45 gray boost using a IM RT technique. The patient's final dose was 54 gray.  Narrative: The patient tolerated radiation treatment relatively well.   He was able to complete the prescribed course of treatment without significant delays or unexpected difficulties.  Plan: The patient has completed radiation treatment. The patient will return to radiation oncology clinic for routine followup in one month. I advised the patient to call or return sooner if they have any questions or concerns related to their recovery or treatment. ________________________________  Jodelle Gross, M.D., Ph.D.

## 2014-07-22 ENCOUNTER — Telehealth: Payer: Self-pay | Admitting: Nutrition

## 2014-07-22 NOTE — Telephone Encounter (Signed)
Contacted patient on his mobile cell number to follow-up regarding potential surgery for esophageal cancer.   Patient has met with Dr. Gerhardt but as of this time, there is no surgery scheduled.   Patient would be eligible to participate in trial using impact advanced recovery.   Patient is aware of trial.   I requested he contact me and let me know of his plans.  **Disclaimer: This note was dictated with voice recognition software. Similar sounding words can inadvertently be transcribed and this note may contain transcription errors which may not have been corrected upon publication of note.**   

## 2014-09-30 ENCOUNTER — Telehealth: Payer: Self-pay | Admitting: Oncology

## 2014-09-30 NOTE — Telephone Encounter (Signed)
Due to PAL 6/10 f/u moved to 6/28. Left message for patient and mailed schedule.

## 2014-10-07 ENCOUNTER — Ambulatory Visit: Payer: 59 | Admitting: Oncology

## 2014-10-25 ENCOUNTER — Telehealth: Payer: Self-pay | Admitting: *Deleted

## 2014-10-25 ENCOUNTER — Ambulatory Visit: Payer: 59 | Admitting: Oncology

## 2014-10-25 NOTE — Telephone Encounter (Signed)
LVM for callback regarding today's missed appt. No answer.

## 2015-02-20 ENCOUNTER — Telehealth: Payer: Self-pay | Admitting: Oncology

## 2015-02-20 NOTE — Telephone Encounter (Signed)
Wife called for appointment. R/s missed appointment for 6/28 and gave wife new appointment for 11/29.

## 2015-03-28 ENCOUNTER — Ambulatory Visit (HOSPITAL_BASED_OUTPATIENT_CLINIC_OR_DEPARTMENT_OTHER): Payer: 59 | Admitting: Oncology

## 2015-03-28 VITALS — BP 115/73 | HR 79 | Temp 98.0°F | Resp 20 | Ht 71.0 in | Wt 201.6 lb

## 2015-03-28 DIAGNOSIS — C159 Malignant neoplasm of esophagus, unspecified: Secondary | ICD-10-CM | POA: Diagnosis not present

## 2015-03-28 NOTE — Progress Notes (Signed)
  [facility] OFFICE PROGRESS NOTE   Diagnosis: Esophagus cancer  INTERVAL HISTORY:   Billy Romero returns after missing a scheduled office visit in June. He decided against surgery. He reports feeling well. He is jogging and playing golf. No dysphagia.  Objective:  Vital signs in last 24 hours:  There were no vitals taken for this visit.    HEENT: Neck without mass Lymphatics: No cervical, supraclavicular, axillary, or inguinal nodes Resp: Lungs clear bilaterally Cardio: Regular rate and rhythm GI: No hepatomegaly, no mass, nontender Vascular: No leg edema   Lab Results:  Lab Results  Component Value Date   WBC 4.6 06/15/2014   HGB 12.1* 06/15/2014   HCT 35.5* 06/15/2014   MCV 90.8 06/15/2014   PLT 256 06/15/2014   NEUTROABS 3.5 06/15/2014     Medications: I have reviewed the patient's current medications.  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 Radiation completed 06/10/2014  PET scan 07/01/2014 revealed improvement in the hypermetabolic esophagus mass and mediastinal lymphadenopathy  2. Solid/liquid dysphagia secondary to #1  Jejunostomy feeding tube placement 04/07/2014  Feeding tube removed 05/30/2014.  3. Small bowel obstruction following placement of the jejunostomy feeding tube-resolved  4. History of ulcerative colitis  5. Aspiration pneumonia December 2015   Disposition:  Billy Romero is in clinical remission from esophagus cancer. He decided against an esophagectomy procedure earlier this year. He confirmed that he does not want to undergo surgery. The plan is to follow him with  observation. He will return for an office visit in 6 months. He will contact us for new symptoms.  Betsy Coder, MD  03/28/2015  4:37 PM

## 2015-05-08 ENCOUNTER — Other Ambulatory Visit: Payer: Self-pay | Admitting: Nurse Practitioner

## 2015-05-08 ENCOUNTER — Telehealth: Payer: Self-pay | Admitting: Oncology

## 2015-05-08 NOTE — Telephone Encounter (Signed)
SPOKE WITH PATIENT RE APPOINTMENT FOR 1/10.

## 2015-05-09 ENCOUNTER — Ambulatory Visit (HOSPITAL_BASED_OUTPATIENT_CLINIC_OR_DEPARTMENT_OTHER): Payer: BLUE CROSS/BLUE SHIELD | Admitting: Nurse Practitioner

## 2015-05-09 ENCOUNTER — Telehealth: Payer: Self-pay | Admitting: Oncology

## 2015-05-09 VITALS — BP 132/81 | HR 82 | Temp 98.9°F | Resp 18 | Ht 71.0 in | Wt 210.2 lb

## 2015-05-09 DIAGNOSIS — Z8501 Personal history of malignant neoplasm of esophagus: Secondary | ICD-10-CM

## 2015-05-09 DIAGNOSIS — C154 Malignant neoplasm of middle third of esophagus: Secondary | ICD-10-CM

## 2015-05-09 DIAGNOSIS — K228 Other specified diseases of esophagus: Secondary | ICD-10-CM

## 2015-05-09 NOTE — Progress Notes (Addendum)
[place] OFFICE PROGRESS NOTE   Diagnosis:   Esophagus cancer  INTERVAL HISTORY:   Billy Romero returns prior to scheduled follow-up. He underwent an upper endoscopy 04/20/2015. The proximal esophagus appeared normal. The mid esophageal segment from 29-32 cm was mildly narrowed from stricturing. The mucosa was intact and normal appearing except for 2 small nodules, each approximately 5 mm in diameter with slight erythema and central pallor. No ulceration was present. Biopsy of the 2 mucosal nodules and random biopsies in the 3 cm segment were obtained. Mid esophagus biopsy showed scant and detached fragments with high-grade squamous dysplasia. Esophageal brushings returned negative for malignant cells and no fungal organisms present.  He feels well. He has a good appetite. He is gaining weight. He remains very active. No dysphagia or odynophagia. No shortness of breath. He denies pain.  Objective:  Vital signs in last 24 hours:  Blood pressure 132/81, pulse 82, temperature 98.9 F (37.2 C), temperature source Oral, resp. rate 18, height '5\' 11"'$  (1.803 m), weight 210 lb 3.2 oz (95.346 kg), SpO2 98 %.    HEENT: No neck mass. Lymphatics: No palpable cervical, supraclavicular or axillary nodes. Resp: Lungs clear bilaterally. Cardio: Regular rate and rhythm. GI: Abdomen soft and nontender. No hepatomegaly. Vascular: No leg edema.   Lab Results:  Lab Results  Component Value Date   WBC 4.6 06/15/2014   HGB 12.1* 06/15/2014   HCT 35.5* 06/15/2014   MCV 90.8 06/15/2014   PLT 256 06/15/2014   NEUTROABS 3.5 06/15/2014    Imaging:  No results found.  Medications: I have reviewed the patient's current medications.  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 Radiation completed 06/10/2014  PET scan 07/01/2014 revealed improvement in the hypermetabolic esophagus mass and mediastinal lymphadenopathy  2. Solid/liquid dysphagia secondary to #1  Jejunostomy feeding tube placement 04/07/2014  Feeding tube removed 05/30/2014.  3. Small bowel obstruction following placement of the jejunostomy feeding tube-resolved  4. History of ulcerative colitis  5. Aspiration pneumonia December 2015  6. Status post upper endoscopy 04/20/2015. Proximal esophagus appeared normal. The mid esophageal segment from 29-32 cm was mildly narrowed from stricturing. The mucosa was intact and normal appearing except for 2 small nodules, each approximately 5 mm in diameter with slight erythema and central pallor. No ulceration was present. Biopsy of the 2 mucosal nodules and random biopsies in the 3 cm segment were obtained. Mid esophagus biopsy showed scant and detached fragments with high-grade squamous dysplasia. Esophageal brushings returned negative for malignant cells and no fungal organisms present.  Disposition: Billy Romero appears well. The recent esophagus biopsy showed high-grade squamous dysplasia. Dr. Benay Spice recommends a restaging PET scan to evaluate for evidence of local recurrence as well as distant disease. Pending the PET scan result he may also need a repeat biopsy. His case will be presented at the GI tumor conference.  He is scheduled to return for a follow-up visit in May of this year. We will adjust that appointment accordingly pending the PET findings/GI tumor conference consensus.  Patient seen with Dr. Benay Spice. 25 minutes were spent face-to-face at today's visit with the majority of that time involved in counseling/coordination of care.    Ned Card ANP/GNP-BC   05/09/2015  2:33  PM  This was a shared visit  with Ned Card. He has been diagnosed with possible residual tumor at the site of the squamous cell carcinoma. We will obtain the endoscopy report from Dr. Earlean Shawl and schedule a staging PET scan. He will be referred for a repeat endoscopic biopsy pending the PET scan result.  Julieanne Manson, M.D.

## 2015-05-09 NOTE — Telephone Encounter (Signed)
Talked to patient here in office. Patient's PET Scan should be scheduled by central scheduling.       [facility].

## 2015-05-17 ENCOUNTER — Other Ambulatory Visit: Payer: Self-pay | Admitting: *Deleted

## 2015-05-17 DIAGNOSIS — C154 Malignant neoplasm of middle third of esophagus: Secondary | ICD-10-CM

## 2015-05-17 DIAGNOSIS — C159 Malignant neoplasm of esophagus, unspecified: Secondary | ICD-10-CM

## 2015-05-18 ENCOUNTER — Telehealth: Payer: Self-pay | Admitting: Oncology

## 2015-05-18 ENCOUNTER — Encounter (HOSPITAL_COMMUNITY): Payer: BLUE CROSS/BLUE SHIELD

## 2015-05-18 NOTE — Telephone Encounter (Signed)
Spoke with patients wife and she is aware of his added lab

## 2015-05-25 ENCOUNTER — Ambulatory Visit (HOSPITAL_COMMUNITY)
Admission: RE | Admit: 2015-05-25 | Discharge: 2015-05-25 | Disposition: A | Discharge status: Home / Self Care | Payer: BLUE CROSS/BLUE SHIELD | Source: Ambulatory Visit | Attending: Oncology | Admitting: Oncology

## 2015-05-25 ENCOUNTER — Other Ambulatory Visit (HOSPITAL_BASED_OUTPATIENT_CLINIC_OR_DEPARTMENT_OTHER): Payer: BLUE CROSS/BLUE SHIELD

## 2015-05-25 DIAGNOSIS — C159 Malignant neoplasm of esophagus, unspecified: Secondary | ICD-10-CM

## 2015-05-25 DIAGNOSIS — Q433 Congenital malformations of intestinal fixation: Secondary | ICD-10-CM | POA: Diagnosis not present

## 2015-05-25 DIAGNOSIS — C154 Malignant neoplasm of middle third of esophagus: Secondary | ICD-10-CM

## 2015-05-25 DIAGNOSIS — Z9221 Personal history of antineoplastic chemotherapy: Secondary | ICD-10-CM | POA: Insufficient documentation

## 2015-05-25 DIAGNOSIS — R918 Other nonspecific abnormal finding of lung field: Secondary | ICD-10-CM | POA: Diagnosis not present

## 2015-05-25 DIAGNOSIS — Z923 Personal history of irradiation: Secondary | ICD-10-CM | POA: Diagnosis not present

## 2015-05-25 DIAGNOSIS — R933 Abnormal findings on diagnostic imaging of other parts of digestive tract: Secondary | ICD-10-CM | POA: Insufficient documentation

## 2015-05-25 LAB — BASIC METABOLIC PANEL
Anion Gap: 7 mEq/L (ref 3–11)
BUN: 17.4 mg/dL (ref 7.0–26.0)
CO2: 29 meq/L (ref 22–29)
CREATININE: 1.3 mg/dL (ref 0.7–1.3)
Calcium: 9.8 mg/dL (ref 8.4–10.4)
Chloride: 107 mEq/L (ref 98–109)
EGFR: 60 mL/min/{1.73_m2} — ABNORMAL LOW (ref >90)
GLUCOSE: 99 mg/dL (ref 70–140)
Potassium: 4.9 mEq/L (ref 3.5–5.1)
Sodium: 142 mEq/L (ref 136–145)

## 2015-05-25 MED ORDER — IOHEXOL 300 MG/ML  SOLN
100.0000 mL | Freq: Once | INTRAMUSCULAR | Status: AC | PRN
  Administered 2015-05-25: 100 mL via INTRAVENOUS

## 2015-05-26 ENCOUNTER — Other Ambulatory Visit: Payer: Self-pay | Admitting: *Deleted

## 2015-05-26 ENCOUNTER — Telehealth: Payer: Self-pay | Admitting: Oncology

## 2015-05-26 NOTE — Telephone Encounter (Signed)
Spoke with wife re appointment for 1/31.

## 2015-05-30 ENCOUNTER — Telehealth: Payer: Self-pay | Admitting: Nurse Practitioner

## 2015-05-30 ENCOUNTER — Ambulatory Visit (HOSPITAL_BASED_OUTPATIENT_CLINIC_OR_DEPARTMENT_OTHER): Payer: BLUE CROSS/BLUE SHIELD | Admitting: Nurse Practitioner

## 2015-05-30 VITALS — BP 125/69 | HR 71 | Temp 97.7°F | Resp 18 | Ht 71.0 in | Wt 213.0 lb

## 2015-05-30 DIAGNOSIS — R911 Solitary pulmonary nodule: Secondary | ICD-10-CM | POA: Diagnosis not present

## 2015-05-30 DIAGNOSIS — C159 Malignant neoplasm of esophagus, unspecified: Secondary | ICD-10-CM | POA: Diagnosis not present

## 2015-05-30 DIAGNOSIS — C154 Malignant neoplasm of middle third of esophagus: Secondary | ICD-10-CM

## 2015-05-30 NOTE — Telephone Encounter (Signed)
Made appointments per 1/31 pof

## 2015-05-30 NOTE — Progress Notes (Addendum)
[facility] OFFICE PROGRESS NOTE   Diagnosis:  Esophagus cancer  INTERVAL HISTORY:   Billy Romero returns for follow-up. He feels well. He has a good appetite and good energy level. He exercises regularly. He denies pain. No shortness of breath. No bowel or bladder problems.  Objective:  Vital signs in last 24 hours:  Blood pressure 125/69, pulse 71, temperature 97.7 F (36.5 C), temperature source Oral, resp. rate 18, height '5\' 11"'$  (1.803 m), weight 213 lb (96.616 kg), SpO2 100 %.    HEENT: No thrush or ulcers. Lymphatics: No cervical or supraclavicular lymph nodes. Resp: Lungs clear bilaterally. Cardio: Regular rate and rhythm. GI: Abdomen soft and nontender. No hepatomegaly. Vascular: No leg edema.  Lab Results:  Lab Results  Component Value Date   WBC 4.6 06/15/2014   HGB 12.1* 06/15/2014   HCT 35.5* 06/15/2014   MCV 90.8 06/15/2014   PLT 256 06/15/2014   NEUTROABS 3.5 06/15/2014    Imaging:  No results found.  Medications: I have reviewed the patient's current medications.  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 Radiation completed 06/10/2014  PET scan 07/01/2014 revealed improvement in the hypermetabolic esophagus mass and mediastinal lymphadenopathy  CT chest/abdomen/pelvis 05/25/2015 with development of bilateral pulmonary nodules; paratracheal and subcarinal nodes improved/similar; new small lower paraesophageal nodes which are indeterminate; esophageal wall thickening improved since the prior PET; no evidence of metastatic disease in the abdomen or pelvis.  2. Solid/liquid dysphagia secondary to  #1  Jejunostomy feeding tube placement 04/07/2014  Feeding tube removed 05/30/2014.  3. Small bowel obstruction following placement of the jejunostomy feeding tube-resolved  4. History of ulcerative colitis  5. Aspiration pneumonia December 2015  6. Status post upper endoscopy 04/20/2015. Proximal esophagus appeared normal. The mid esophageal segment from 29-32 cm was mildly narrowed from stricturing. The mucosa was intact and normal appearing except for 2 small nodules, each approximately 5 mm in diameter with slight erythema and central pallor. No ulceration was present. Biopsy of the 2 mucosal nodules and random biopsies in the 3 cm segment were obtained. Mid esophagus biopsy showed scant and detached fragments with high-grade squamous dysplasia. Esophageal brushings returned negative for malignant cells and no fungal organisms present.   Disposition: Billy Romero appears well. We reviewed the CT results/images with Billy Romero and his wife. There are 2 new small lung nodules. We mutually decided to proceed with a PET scan to further evaluate the lung nodules. We will schedule a follow-up visit a few days after the PET scan to review the results.  Patient seen with Dr. Benay Spice. 25 minutes were spent face-to-face at today's visit with the majority of that time involved in counseling/coordination of care.    Ned Card ANP/GNP-BC   05/30/2015  12:15 PM  This was a shared visit with Ned Card. We reviewed the CT images with Billy Romero and his wife. It is unclear whether the lung nodules represent scarring from previous infection, metastatic esophagus cancer, another malignancy, or a current infection.  We discussed a three-month follow-up CT versus proceeding with a staging PET scan. He would like to proceed with a PET scan. If the PET scan is negative I will refer him to Dr. Earlean Shawl for a repeat endoscopy/biopsy. If  the PET scan suggest malignant lung nodules he will be referred  for a diagnostic biopsy.  Julieanne Manson, M.D.

## 2015-05-31 ENCOUNTER — Telehealth: Payer: Self-pay | Admitting: *Deleted

## 2015-05-31 NOTE — Telephone Encounter (Signed)
Call from Miles with [facility] to follow up on orders sent in December. None found scanned into system. She will re-fax.

## 2015-06-09 ENCOUNTER — Encounter (HOSPITAL_COMMUNITY): Payer: BLUE CROSS/BLUE SHIELD

## 2015-06-14 ENCOUNTER — Ambulatory Visit: Payer: BLUE CROSS/BLUE SHIELD | Admitting: Oncology

## 2015-06-14 ENCOUNTER — Other Ambulatory Visit: Payer: Self-pay | Admitting: *Deleted

## 2015-06-14 ENCOUNTER — Telehealth: Payer: Self-pay | Admitting: Oncology

## 2015-06-14 NOTE — Telephone Encounter (Signed)
Left message for patient in regards to 2/20 appt '@230'$  pm per 2/15 pof. Advised pt to call office if this date/time does not work

## 2015-06-16 ENCOUNTER — Ambulatory Visit (HOSPITAL_COMMUNITY)
Admission: RE | Admit: 2015-06-16 | Discharge: 2015-06-16 | Disposition: A | Discharge status: Home / Self Care | Payer: BLUE CROSS/BLUE SHIELD | Source: Ambulatory Visit | Attending: Nurse Practitioner | Admitting: Nurse Practitioner

## 2015-06-16 DIAGNOSIS — R938 Abnormal findings on diagnostic imaging of other specified body structures: Secondary | ICD-10-CM | POA: Insufficient documentation

## 2015-06-16 DIAGNOSIS — C154 Malignant neoplasm of middle third of esophagus: Secondary | ICD-10-CM | POA: Diagnosis present

## 2015-06-16 DIAGNOSIS — R918 Other nonspecific abnormal finding of lung field: Secondary | ICD-10-CM | POA: Insufficient documentation

## 2015-06-16 LAB — GLUCOSE, CAPILLARY: GLUCOSE-CAPILLARY: 99 mg/dL (ref 65–99)

## 2015-06-16 MED ORDER — FLUDEOXYGLUCOSE F - 18 (FDG) INJECTION
8.8500 | Freq: Once | INTRAVENOUS | Status: AC | PRN
  Administered 2015-06-16: 8.85 via INTRAVENOUS

## 2015-06-19 ENCOUNTER — Telehealth: Payer: Self-pay | Admitting: Oncology

## 2015-06-19 ENCOUNTER — Ambulatory Visit (HOSPITAL_BASED_OUTPATIENT_CLINIC_OR_DEPARTMENT_OTHER): Payer: BLUE CROSS/BLUE SHIELD | Admitting: Oncology

## 2015-06-19 VITALS — BP 116/73 | HR 73 | Temp 98.5°F | Resp 19 | Ht 71.0 in | Wt 207.5 lb

## 2015-06-19 DIAGNOSIS — R599 Enlarged lymph nodes, unspecified: Secondary | ICD-10-CM | POA: Diagnosis not present

## 2015-06-19 DIAGNOSIS — C154 Malignant neoplasm of middle third of esophagus: Secondary | ICD-10-CM

## 2015-06-19 DIAGNOSIS — R911 Solitary pulmonary nodule: Secondary | ICD-10-CM | POA: Diagnosis not present

## 2015-06-19 NOTE — Telephone Encounter (Signed)
Gave patient avs report and appointments for 3/16 at [facility] and 2/23 with Dr. Servando Snare at [facility].

## 2015-06-19 NOTE — Progress Notes (Signed)
[facility] OFFICE PROGRESS NOTE   Diagnosis: esophagus cancer  INTERVAL HISTORY:   [name] returns as scheduled. He feels well. No complaint.  Objective:  Vital signs in last 24 hours:  Blood pressure 116/73, pulse 73, temperature 98.5 F (36.9 C), temperature source Oral, resp. rate 19, height '5\' 11"'$  (1.803 m), weight 207 lb 8 oz (94.121 kg), SpO2 98 %.    HEENT: neck without mass Lymphatics: no cervical, supraclavicular, or axillary nodes Resp: lungs clear bilaterally Cardio: regular rate and rhythm GI: no hepatosplenomegaly, no mass Vascular: no leg edema   Lab Results:  Lab Results  Component Value Date   WBC 4.6 06/15/2014   HGB 12.1* 06/15/2014   HCT 35.5* 06/15/2014   MCV 90.8 06/15/2014   PLT 256 06/15/2014   NEUTROABS 3.5 06/15/2014    Lab Results  Component Value Date   NA 142 05/25/2015    No results found for: CEA1  Imaging:  Nm Pet Image Restag (ps) Skull Base To Thigh  06/16/2015  CLINICAL DATA:  Subsequent treatment strategy for esophageal cancer with new lung metastases. EXAM: NUCLEAR MEDICINE PET SKULL BASE TO THIGH TECHNIQUE: 8.85 mCi F-18 FDG was injected intravenously. Full-ring PET imaging was performed from the skull base to thigh after the radiotracer. CT data was obtained and used for attenuation correction and anatomic localization. FASTING BLOOD GLUCOSE:  Value: 99 mg/dl COMPARISON:  PET-CT dated 07/01/2014. CT chest abdomen pelvis dated 05/25/2015. FINDINGS: NECK No hypermetabolic lymph nodes in the neck. CHEST Bilateral pulmonary nodules, suspicious for metastases, including: --5 mm nodule in the central left upper lobe (series 6/ image 21), max SUV 2.7 --10 mm cavitary nodule in the posterior left upper lobe (series 6/ image 26), max SUV 5.4 --10 mm nodule at the medial right lung base (series 6/ image 51), max SUV 5.8 Additional hypermetabolism with interstitial soft tissue thickening along the right infrahilar region  (series 6/image 36), max SUV 9.7. No focal consolidation.  No pleural effusion or pneumothorax. The heart is normal in size.  No pericardial effusion. 7 mm short axis subcarinal node (series 4/ image 82), max SUV 6.1. 6 mm short axis node adjacent to the thoracic spine in posterior to the descending thoracic aorta (series 4/ image 89), max SUV 6.3. Prior wall thickening with hypermetabolism along the mid/distal esophagus has improved. ABDOMEN/PELVIS No abnormal hypermetabolic activity within the liver, pancreas, adrenal glands, or spleen. Mild atherosclerotic calcifications the abdominal aorta. Prostatomegaly. No hypermetabolic lymph nodes in the abdomen or pelvis. SKELETON No focal hypermetabolic activity to suggest skeletal metastasis. IMPRESSION: Prior wall thickening hypermetabolism along the mid/distal esophagus has improved. 10 mm nodule at the medial right lung base and two left upper lobe pulmonary nodules measuring up to 10 mm, with associated hypermetabolism, suspicious metastasis. Additional hypermetabolism with interstitial soft tissues thickening along the right infrahilar region. 7 mm short axis subcarinal node and a 6 mm short axis lateral thoracic node posterior to the descending thoracic aorta, with associated hypermetabolism, suspicious for nodal metastases. Electronically Signed   By: Julian Hy M.D.   On: 06/16/2015 15:39   PET images reviewed with Billy Romero and his wife Medications: I have reviewed the patient's current medications.  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 Radiation completed 06/10/2014  PET scan 07/01/2014 revealed  improvement in the hypermetabolic esophagus mass and mediastinal lymphadenopathy  CT chest/abdomen/pelvis 05/25/2015 with development of bilateral pulmonary nodules; paratracheal and subcarinal nodes improved/similar; new small lower paraesophageal nodes which are indeterminate; esophageal wall thickening improved since the prior PET; no evidence of metastatic disease in the abdomen or pelvis.  PET scan 06/16/2015 revealed hypermetabolic lung nodules, hypermetabolic mediastinal nodes, hypermetabolism at the mid to distal esophagus has improved  2. Solid/liquid dysphagia secondary to #1  Jejunostomy feeding tube placement 04/07/2014  Feeding tube removed 05/30/2014.  3. Small bowel obstruction following placement of the jejunostomy feeding tube-resolved  4. History of ulcerative colitis  5. Aspiration pneumonia December 2015   Disposition:  Billy Romero appears stable. I reviewed the PET scan images with him. 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 He is asymptomatic. I will refer him to Dr. Servando Snare to consider biopsy options. He may be a candidate for a mediastinoscopy or EBUS. He will return for an office visit here in approximately 3 weeks. We will discuss systemic treatment options if a diagnosis of metastatic esophagus cancer is confirmed.  Betsy Coder, MD  06/19/2015  2:28 PM

## 2015-06-22 ENCOUNTER — Encounter: Payer: Self-pay | Admitting: Cardiothoracic Surgery

## 2015-06-22 ENCOUNTER — Ambulatory Visit (INDEPENDENT_AMBULATORY_CARE_PROVIDER_SITE_OTHER): Payer: BLUE CROSS/BLUE SHIELD | Admitting: Cardiothoracic Surgery

## 2015-06-22 ENCOUNTER — Other Ambulatory Visit: Payer: Self-pay | Admitting: *Deleted

## 2015-06-22 VITALS — BP 123/71 | HR 61 | Resp 20 | Ht 71.0 in | Wt 205.0 lb

## 2015-06-22 DIAGNOSIS — R911 Solitary pulmonary nodule: Secondary | ICD-10-CM

## 2015-06-22 DIAGNOSIS — C159 Malignant neoplasm of esophagus, unspecified: Secondary | ICD-10-CM | POA: Diagnosis not present

## 2015-06-22 NOTE — Progress Notes (Signed)
Patient ID: RONDALL RADIGAN, male   DOB: 11/15/1955, 60 y.o.   MRN: [id number]      [address]       [place] [address]             [phone]        Yovan W Heesch [facility] Medical Record #[id number] Date of Birth: 1955-11-01  Referring:Dr B Sherrill Primary Care: Lujean Amel, MD  Chief Complaint:   Obstructing Squamous Cell Esophageal Cancer  History of Present Illness:     JALEEN FINCH is a 60 y.o. 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  He has had many year history of reflux symptoms, since teenager. He presented in dec 2015 with several months he has had increasing difficulty swallowing. Outpatient Endoscopy done by Dr Earlean Shawl demonstrated obstruction squamous cell ca 28-30 cm. Admitted from [place] due to inability to eat or drink and dehydration.  He has now completed course of chemotherapy and radiation with significant improvement in swallowing. J tube is now removed. He has repeat pet scan scheduled for next week. Weekly Taxol/carboplatin 5 completed 05/25/2014. Radiation completed 06/10/2014   He underwent an upper endoscopy 04/20/2015. The proximal esophagus appeared normal. The mid esophageal segment from 29-32 cm was mildly narrowed from stricturing. The mucosa was intact and normal appearing except for 2 small nodules, each approximately 5 mm in diameter with slight erythema and central pallor. No ulceration was present. Biopsy of the 2 mucosal nodules and random biopsies in the 3 cm segment were obtained. Mid esophagus biopsy showed scant and detached fragments with high-grade squamous dysplasia. Esophageal brushings returned negative for malignant cells and no fungal organisms present.   He feels well. He has a good appetite. He is gaining weight. He remains very active. No dysphagia or odynophagia. No shortness of breath. He denies pain.  Current Activity/ Functional  Status: Patient is independent with mobility/ambulation, transfers, ADL's, IADL's.   Zubrod Score: At the time of surgery this patient's most appropriate activity status/level should be described as: '[x]'$     0    Normal activity, no symptoms '[]'$     1    Restricted in physical strenuous activity but ambulatory, able to do out light work '[]'$     2    Ambulatory and capable of self care, unable to do work activities, up and about                 more than 50%  Of the time                            '[]'$     3    Only limited self care, in bed greater than 50% of waking hours '[]'$     4    Completely disabled, no self care, confined to bed or chair '[]'$     5    Moribund  Past Medical History  Diagnosis Date  . Esophageal cancer ([place]) 03/29/14    Past Surgical History  Procedure Laterality Date  . Peg placement N/A 04/06/2014    Procedure: OPEN JEJUNOSTOMY TUBE PLACEMENT;  Surgeon: Armandina Gemma, MD;  Location: [facility];  Service: General;  Laterality: N/A;   Previous cervical spine surgery vis left anterior cervical approach  History  Smoking status  . Never Smoker   Smokeless tobacco  .  uses dip/snuff for 25-30 years  History  Alcohol Use No  pervious  Heavy alcohol use, treated with in AA, has been sober for past 9 years  History  Family History  father and paternal grandfather died of leukemia one chronic one acute , both at age 86. No other family history of cancer. He has one brother, 1 sister, one half sister, and 2 half-brothers   Social History  . Marital Status: Married    Spouse Name: N/A    Number of Children: 2  . Years of Education: N/A   Occupational History  . Retired June 2015 form [employer]  . Smoking status: Never Smoker   . Smokeless tobacco: Not on file  . Alcohol Use: No  . Drug Use: Not on file  . Sexual Activity: Not on file      No Known Allergies  Current Outpatient Prescriptions  Medication Sig Dispense Refill  .  baclofen (LIORESAL) 10 MG tablet Take 10 mg by mouth 2 (two) times daily.     Marland Kitchen HYDROcodone-acetaminophen (NORCO/VICODIN) 5-325 MG tablet Take 1 tablet by mouth every 6 (six) hours as needed. for pain  0  . meloxicam (MOBIC) 15 MG tablet Take 15 mg by mouth daily.      No current facility-administered medications for this visit.    No family history on file.   Review of Systems:     Cardiac Review of Systems: Y or N  Chest Pain [ n   ]  Resting SOB [n   ] Exertional SOB  [n  ]  Orthopnea [ n ]   Pedal Edema [ n  ]    Palpitations [n  ] Syncope  [n  ]   Presyncope [ n  ]  General Review of Systems: [Y] = yes [  ]=no Constitional: recent weight change Blue.Reese  ]; anorexia Blue.Reese  ]; fatigue [  ]; nausea Blue.Reese  ]; night sweats [ n ]; fever [ n ]; or chills [n  ]                                                               Dental: poor dentition[ n ]; Last Dentist visit:   Eye : blurred vision [  ]; diplopia [   ]; vision changes [  ];  Amaurosis fugax[  ]; Resp: cough [  ];  wheezing[n  ];  hemoptysis[ n ]; shortness of breath[n  ]; paroxysmal nocturnal dyspnea[n  ]; dyspnea on exertion[  ]; or orthopnea[  ];  GI:  gallstones[n  ], vomiting[n  ];  dysphagia[  ]; melena[  ];  hematochezia [ n ]; heartburn[  ];   Hx of  Colonoscopy[ y ]; GU: kidney stones [  ]; hematuria[  ];   dysuria [  ];  nocturia[  ];  history of     obstruction [  ]; urinary frequency [  ]             Skin: rash, swelling[  ];, hair loss[  ];  peripheral edema[  ];  or itching[  ]; Musculosketetal: myalgias[  ];  joint swelling[  ];  joint erythema[  ];  joint pain[  ];  back pain[  ];  Heme/Lymph: bruising[ n ];  bleeding[n  ];  anemia[  ];  Neuro: TIA[ n ];  headaches[  ];  stroke[  ];  vertigo[  ];  seizures[  ];   paresthesias[  ];  difficulty walking[  ];  Psych:depression[  ]; anxiety[  ];  Endocrine: diabetes[n  ];  thyroid dysfunction[ n ];  Immunizations: Flu [ n ]; Pneumococcal[n  ];  Other:  Physical Exam: BP 123/71  mmHg  Pulse 61  Resp 20  Ht '5\' 11"'$  (1.803 m)  Wt 205 lb (92.987 kg)  BMI 28.60 kg/m2  SpO2 96%  General appearance: alert, cooperative and appears stated age Neurologic: intact Heart: regular rate and rhythm, S1, S2 normal, no murmur, click, rub or gallop Abdomen: soft, non-tender; bowel sounds normal; no masses,  no organomegaly Extremities: extremities normal, atraumatic, no cyanosis or edema and Homans sign is negative, no sign of DVT Wound: feeding j tube placed to left of midline abdomen functioning No cervical or supraclavicular adenopathy Full pedal pulses bilaterial  Diagnostic Studies & Laboratory data:     Recent Radiology Findings:   Ct Chest W Contrast  05/25/2015  CLINICAL DATA:  Restaging esophageal cancer, diagnosed 11/15. Radiation therapy/ chemotherapy complete. Recent biopsy demonstrating high-grade squamous dysplasia. EXAM: CT CHEST, ABDOMEN, AND PELVIS WITH CONTRAST TECHNIQUE: Multidetector CT imaging of the chest, abdomen and pelvis was performed following the standard protocol during bolus administration of intravenous contrast. CONTRAST:  138m OMNIPAQUE IOHEXOL 300 MG/ML  SOLN COMPARISON:  PET of 07/01/2014. Most recent diagnostic chest CT 04/10/2014. Abdominal pelvic CT 04/08/2014. FINDINGS: CT CHEST FINDINGS Mediastinum/Nodes: No supraclavicular adenopathy. Aortic and branch vessel atherosclerosis. Normal heart size, without pericardial effusion. No central pulmonary embolism, on this non-dedicated study. Mediastinal edema which is likely radiation induced. Right paratracheal node measures 5 mm on image 9/series 2 versus 8 mm previously. Subcarinal/ periesophageal node measures 7 mm on image 30/series 2 versus similar. No hilar adenopathy. Inferior periesophageal nodes measure up to 5 mm on image 41/series 2 and are likely new since the prior PET. Mild wall thickening in the mid esophagus is improved, including on image 27/series 2. The more superior esophagus is  dilated, but less than on the prior PET. Lungs/Pleura: No pleural fluid. Mild paramediastinal fibrosis bilaterally, likely radiation induced. Pleural-based right lower lobe pulmonary nodule measures 9 x 12 mm on image 47/series 5, new. Centrally cavitary pleural-based left upper lobe pulmonary nodule measures 9 x 7 mm on image 23/ series 5 and is new. Musculoskeletal: No acute osseous abnormality. CT ABDOMEN PELVIS FINDINGS Hepatobiliary: Normal liver. Normal gallbladder, without biliary ductal dilatation. Pancreas: Normal, without mass or ductal dilatation. Spleen: Normal in size, without focal abnormality. Adrenals/Urinary Tract: Normal adrenal glands. Normal kidneys, without hydronephrosis. Normal urinary bladder. Stomach/Bowel: Normal stomach, without wall thickening. Normal appearance of the colon and normal position of the ileocecal junction. 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 Lack of normal duodenal jejunal junction position and reversal of expected superior mesenteric artery to venous positioning. No complicating obstruction. Vascular/Lymphatic: Aortic and branch vessel atherosclerosis. No abdominopelvic adenopathy. Reproductive: Normal prostate. Other: No significant free fluid. No evidence of omental or peritoneal disease. Musculoskeletal: Degenerative partial fusion of the left sacroiliac joint. Right iliac bone island. Bilateral L5 pars defects. Disc bulges at L4-5 and L5-S1. IMPRESSION: CT CHEST IMPRESSION 1. Development of bilateral pulmonary nodules, highly suspicious for metastatic disease. 2. Paratracheal and subcarinal nodes are improved and similar as detailed above. There  are new small lower periesophageal nodes which are indeterminate. Esophageal wall thickening is improved since the prior PET. CT ABDOMEN AND PELVIS IMPRESSION 1. No acute process or evidence of metastatic disease in the abdomen or pelvis. 2. Small  bowel non rotation, without complicating obstruction. Electronically Signed   By: Abigail Miyamoto M.D.   On: 05/25/2015 13:58   Ct Abdomen Pelvis W Contrast  05/25/2015  CLINICAL DATA:  Restaging esophageal cancer, diagnosed 11/15. Radiation therapy/ chemotherapy complete. Recent biopsy demonstrating high-grade squamous dysplasia. EXAM: CT CHEST, ABDOMEN, AND PELVIS WITH CONTRAST TECHNIQUE: Multidetector CT imaging of the chest, abdomen and pelvis was performed following the standard protocol during bolus administration of intravenous contrast. CONTRAST:  155m OMNIPAQUE IOHEXOL 300 MG/ML  SOLN COMPARISON:  PET of 07/01/2014. Most recent diagnostic chest CT 04/10/2014. Abdominal pelvic CT 04/08/2014. FINDINGS: CT CHEST FINDINGS Mediastinum/Nodes: No supraclavicular adenopathy. Aortic and branch vessel atherosclerosis. Normal heart size, without pericardial effusion. No central pulmonary embolism, on this non-dedicated study. Mediastinal edema which is likely radiation induced. Right paratracheal node measures 5 mm on image 9/series 2 versus 8 mm previously. Subcarinal/ periesophageal node measures 7 mm on image 30/series 2 versus similar. No hilar adenopathy. Inferior periesophageal nodes measure up to 5 mm on image 41/series 2 and are likely new since the prior PET. Mild wall thickening in the mid esophagus is improved, including on image 27/series 2. The more superior esophagus is dilated, but less than on the prior PET. Lungs/Pleura: No pleural fluid. Mild paramediastinal fibrosis bilaterally, likely radiation induced. Pleural-based right lower lobe pulmonary nodule measures 9 x 12 mm on image 47/series 5, new. Centrally cavitary pleural-based left upper lobe pulmonary nodule measures 9 x 7 mm on image 23/ series 5 and is new. Musculoskeletal: No acute osseous abnormality. CT ABDOMEN PELVIS FINDINGS Hepatobiliary: Normal liver. Normal gallbladder, without biliary ductal dilatation. Pancreas: Normal, without mass  or ductal dilatation. Spleen: Normal in size, without focal abnormality. Adrenals/Urinary Tract: Normal adrenal glands. Normal kidneys, without hydronephrosis. Normal urinary bladder. Stomach/Bowel: Normal stomach, without wall thickening. Normal appearance of the colon and normal position of the ileocecal junction. 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 Lack of normal duodenal jejunal junction position and reversal of expected superior mesenteric artery to venous positioning. No complicating obstruction. Vascular/Lymphatic: Aortic and branch vessel atherosclerosis. No abdominopelvic adenopathy. Reproductive: Normal prostate. Other: No significant free fluid. No evidence of omental or peritoneal disease. Musculoskeletal: Degenerative partial fusion of the left sacroiliac joint. Right iliac bone island. Bilateral L5 pars defects. Disc bulges at L4-5 and L5-S1. IMPRESSION: CT CHEST IMPRESSION 1. Development of bilateral pulmonary nodules, highly suspicious for metastatic disease. 2. Paratracheal and subcarinal nodes are improved and similar as detailed above. There are new small lower periesophageal nodes which are indeterminate. Esophageal wall thickening is improved since the prior PET. CT ABDOMEN AND PELVIS IMPRESSION 1. No acute process or evidence of metastatic disease in the abdomen or pelvis. 2. Small bowel non rotation, without complicating obstruction. Electronically Signed   By: KAbigail MiyamotoM.D.   On: 05/25/2015 13:58   Nm Pet Image Restag (ps) Skull Base To Thigh  06/16/2015  CLINICAL DATA:  Subsequent treatment strategy for esophageal cancer with new lung metastases. EXAM: NUCLEAR MEDICINE PET SKULL BASE TO THIGH TECHNIQUE: 8.85 mCi F-18 FDG was injected intravenously. Full-ring PET imaging was performed from the skull base to thigh after the radiotracer. CT data was obtained and used for  attenuation correction and anatomic  localization. FASTING BLOOD GLUCOSE:  Value: 99 mg/dl COMPARISON:  PET-CT dated 07/01/2014. CT chest abdomen pelvis dated 05/25/2015. FINDINGS: NECK No hypermetabolic lymph nodes in the neck. CHEST Bilateral pulmonary nodules, suspicious for metastases, including: --5 mm nodule in the central left upper lobe (series 6/ image 21), max SUV 2.7 --10 mm cavitary nodule in the posterior left upper lobe (series 6/ image 26), max SUV 5.4 --10 mm nodule at the medial right lung base (series 6/ image 51), max SUV 5.8 Additional hypermetabolism with interstitial soft tissue thickening along the right infrahilar region (series 6/image 36), max SUV 9.7. No focal consolidation.  No pleural effusion or pneumothorax. The heart is normal in size.  No pericardial effusion. 7 mm short axis subcarinal node (series 4/ image 82), max SUV 6.1. 6 mm short axis node adjacent to the thoracic spine in posterior to the descending thoracic aorta (series 4/ image 89), max SUV 6.3. Prior wall thickening with hypermetabolism along the mid/distal esophagus has improved. ABDOMEN/PELVIS No abnormal hypermetabolic activity within the liver, pancreas, adrenal glands, or spleen. Mild atherosclerotic calcifications the abdominal aorta. Prostatomegaly. No hypermetabolic lymph nodes in the abdomen or pelvis. SKELETON No focal hypermetabolic activity to suggest skeletal metastasis. IMPRESSION: Prior wall thickening hypermetabolism along the mid/distal esophagus has improved. 10 mm nodule at the medial right lung base and two left upper lobe pulmonary nodules measuring up to 10 mm, with associated hypermetabolism, suspicious metastasis. Additional hypermetabolism with interstitial soft tissues thickening along the right infrahilar region. 7 mm short axis subcarinal node and a 6 mm short axis lateral thoracic node posterior to the descending thoracic aorta, with associated hypermetabolism, suspicious for nodal metastases. Electronically Signed   By: Julian Hy M.D.   On: 06/16/2015 15:39   PET in 04/2014  CLINICAL DATA: Initial treatment strategy for esophageal carcinoma.  EXAM: NUCLEAR MEDICINE PET SKULL BASE TO THIGH  TECHNIQUE: 10.0 mCi F-18 FDG was injected intravenously. Full-ring PET imaging was performed from the skull base to thigh after the radiotracer. CT data was obtained and used for attenuation correction and anatomic localization.  FASTING BLOOD GLUCOSE: Value: 86 mg/dl  COMPARISON: Chest CT on 04/10/2014 and AP CT on 04/08/2014  FINDINGS: NECK  9 mm left supraclavicular lymph node on image 52/series for is hypermetabolic, with SUV max of 8.9. No other hypermetabolic cervical lymph nodes identified.  CHEST  Hypermetabolic mass is seen in the mid thoracic esophagus with proximal esophageal dilatation. This mass has SUV max of 24.2. This is consistent with primary esophageal carcinoma.  Hypermetabolic mediastinal lymphadenopathy is seen along the posterior aspect of the esophageal mass and in the high right paratracheal and prevascular regions of the mediastinum. The high right paratracheal lymph node measures 2.3 cm on image 57/series 4 with SUV max of 8.8.  No suspicious pulmonary nodules seen by CT.  ABDOMEN/PELVIS  No abnormal hypermetabolic activity within the liver, pancreas, adrenal glands, or spleen. No hypermetabolic lymph nodes in the abdomen or pelvis. Percutaneous jejunostomy tube noted.  SKELETON  No focal hypermetabolic activity to suggest skeletal metastasis.  IMPRESSION: Hypermetabolic mid thoracic esophageal mass, consistent with primary esophageal carcinoma.  Hypermetabolic metastatic lymphadenopathy in mediastinum and left supraclavicular region.  No evidence of metastatic disease in abdomen or pelvis.   Electronically Signed  By: Earle Gell M.D.  On: 05/03/2014 15:36 I have independently reviewed the above radiology studies  and reviewed the  findings with the patient.   Recent Lab Findings: Lab Results  Component Value Date   WBC 4.6 06/15/2014   HGB 12.1* 06/15/2014   HCT 35.5* 06/15/2014   PLT 256 06/15/2014   GLUCOSE 99 05/25/2015   TRIG 67 04/18/2014   ALT 24 06/08/2014   AST 21 06/08/2014   NA 142 05/25/2015   K 4.9 05/25/2015   CL 107 04/21/2014   CREATININE 1.3 05/25/2015   BUN 17.4 05/25/2015   CO2 29 05/25/2015   TSH 0.523 04/05/2014   Patient has not had EUS, with this limitation suspect clinical  IIIB disease with extensive involvement of mediastinal and left subclavian nodes that would not be in operative field.   ENDOSCOPY:Status post upper endoscopy 04/20/2015. Proximal esophagus appeared normal. The mid esophageal segment from 29-32 cm was mildly narrowed from stricturing. The mucosa was intact and normal appearing except for 2 small nodules, each approximately 5 mm in diameter with slight erythema and central pallor. No ulceration was present. Biopsy of the 2 mucosal nodules and random biopsies in the 3 cm segment were obtained. Mid esophagus biopsy showed scant and detached fragments with high-grade squamous dysplasia. Esophageal brushings returned negative for malignant cells and no fungal organisms present.    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 Patient completed course of chemotherapy and radiation with excellent response as far as functional status and swallowing ability. Follow-up CT scans and PET scan now suggest recurrent disease with 3 small lung nodules one on the right to left of right hilar adenopathy hypermetabolic. PET scan is suggestive of pulmonary metastasis.  I discussed with the patient the small size of the lung nodules and their location makes reliable biopsy difficult. We can proceed with bronchoscopy ebus and attempt to obtain tissue diagnosis from the right hilar nodes. I explained the  procedure to the patient who is willing to proceed. We discussed that a negative biopsy does not necessarily equal no evidence of recurrent disease. We offered the patient proceeding with surgery tomorrow, he prefers to wait until March 7     Grace Isaac MD      [address] [place] [address] Office [phone]   Beeper [phone]  06/22/2015 9:42 AM

## 2015-06-22 NOTE — Patient Instructions (Signed)
Flexible Bronchoscopy Bronchoscopy is a procedure used to examine the passageways in the lungs. During the procedure a thin, flexible tool with a lens and camera or eyepiece is passed in your mouth or nose, down the windpipe (trachea), and into the air tubes (bronchi). This tool allows your health care provider to carefully look at your lungs from the inside and take diagnostic samples if needed.  LET [facility] CARE PROVIDER KNOW ABOUT:   Allergies to food or medicine.   All medicines you are taking, including blood thinners, vitamins, herbs, eye drops, creams, and over-the-counter medicines.   Previous problems you or members of your family have had with the use of anesthetics.   Any blood disorders you have.   Previous surgeries you have had.   Medical conditions you have, including heart disease, diabetes, or kidney problems.   Possibility of pregnancy, if this applies. RISKS AND COMPLICATIONS Generally, this is a safe procedure. However, as with any procedure, problems can occur. Possible problems include:   Collapsed lung (pneumothorax).  Bleeding.  Increased need for oxygen or difficulty breathing after the procedure. BEFORE THE PROCEDURE  Do not eat or drink anything after midnight on the night before the procedure or as directed by your health care provider.  PROCEDURE   Relax as much as possible during the procedure.  Medicines may be given to relax you, dry up your secretions, and control coughing.   A numbing medicine (local anesthetic) will be given to numb your mouth, nose, throat, and voice box (larynx). You will be able to breathe normally during the procedure.   Samples of airway secretions may be collected for testing.  If abnormal areas are seen in your airways, tissue samples may be taken for examination under a microscope (biopsy).  If tissue samples are needed from the outer portions of the lung, a type of X-ray called fluoroscopy may be done.    If bleeding occurs, a drug may be used to stop or decrease the bleeding.  AFTER THE PROCEDURE   You may receive a chest X-ray following the procedure. This is to make sure the lungs have not collapsed (pneumothorax).    This information is not intended to replace advice given to you by your health care provider. Make sure you discuss any questions you have with your health care provider.   Document Released: 04/12/2000 Document Revised: 01/04/2015 Document Reviewed: 12/18/2012 Elsevier Interactive Patient Education 2016 [place]. Flexible Bronchoscopy, Care After Refer to this sheet in the next few weeks. These instructions provide you with information on caring for yourself after your procedure. Your health care provider may also give you more specific instructions. Your treatment has been planned according to current medical practices, but problems sometimes occur. Call your health care provider if you have any problems or questions after your procedure.  WHAT TO EXPECT AFTER THE PROCEDURE It is normal to have the following symptoms for 24-48 hours after the procedure:   Increased cough.  Low-grade fever.  Sore throat or hoarse voice.  Small streaks of blood in your thick spit (sputum) if tissue samples were taken (biopsy). HOME CARE INSTRUCTIONS   Do not eat or drink anything for 2 hours after your procedure. Your nose and throat were numbed by medicine. If you try to eat or drink before the medicine wears off, food or drink could go into your lungs or you could burn yourself. After the numbness is gone and your cough and gag reflexes have returned, you may  eat soft food and drink liquids slowly.   The day after the procedure, you can go back to your normal diet.   You may resume normal activities.   Keep all follow-up visits as directed by your health care provider. It is important to keep all your appointments, especially if tissue samples were taken for testing  (biopsy). SEEK IMMEDIATE MEDICAL CARE IF:   You have increasing shortness of breath.   You become light-headed or faint.   You have chest pain.   You have any new concerning symptoms.  You cough up more than a small amount of blood.  The amount of blood you cough up increases. MAKE SURE YOU:  Understand these instructions.  Will watch your condition.  Will get help right away if you are not doing well or get worse.   This information is not intended to replace advice given to you by your health care provider. Make sure you discuss any questions you have with your health care provider.   Document Released: 11/02/2004 Document Revised: 05/06/2014 Document Reviewed: 12/18/2012 Elsevier Interactive Patient Education [employer].

## 2015-06-26 ENCOUNTER — Encounter: Payer: Self-pay | Admitting: Gastroenterology

## 2015-06-29 ENCOUNTER — Encounter (HOSPITAL_COMMUNITY): Payer: Self-pay

## 2015-06-29 ENCOUNTER — Encounter (HOSPITAL_COMMUNITY)
Admission: RE | Admit: 2015-06-29 | Discharge: 2015-06-29 | Disposition: A | Discharge status: Home / Self Care | Payer: BLUE CROSS/BLUE SHIELD | Source: Ambulatory Visit | Attending: Cardiothoracic Surgery | Admitting: Cardiothoracic Surgery

## 2015-06-29 VITALS — BP 124/69 | HR 61 | Temp 97.8°F | Resp 20 | Ht 71.0 in | Wt 207.8 lb

## 2015-06-29 DIAGNOSIS — Z01812 Encounter for preprocedural laboratory examination: Secondary | ICD-10-CM | POA: Insufficient documentation

## 2015-06-29 DIAGNOSIS — R911 Solitary pulmonary nodule: Secondary | ICD-10-CM | POA: Diagnosis not present

## 2015-06-29 HISTORY — DX: Adverse effect of unspecified anesthetic, initial encounter: T41.45XA

## 2015-06-29 HISTORY — DX: Gastro-esophageal reflux disease without esophagitis: K21.9

## 2015-06-29 HISTORY — DX: Other complications of anesthesia, initial encounter: T88.59XA

## 2015-06-29 LAB — CBC
HCT: 40.9 % (ref 39.0–52.0)
Hemoglobin: 14.4 g/dL (ref 13.0–17.0)
MCH: 30.8 pg (ref 26.0–34.0)
MCHC: 35.2 g/dL (ref 30.0–36.0)
MCV: 87.4 fL (ref 78.0–100.0)
Platelets: 148 10*3/uL — ABNORMAL LOW (ref 150–400)
RBC: 4.68 MIL/uL (ref 4.22–5.81)
RDW: 13.1 % (ref 11.5–15.5)
WBC: 4.9 10*3/uL (ref 4.0–10.5)

## 2015-06-29 LAB — COMPREHENSIVE METABOLIC PANEL
ALT: 19 U/L (ref 17–63)
AST: 26 U/L (ref 15–41)
Albumin: 4 g/dL (ref 3.5–5.0)
Alkaline Phosphatase: 63 U/L (ref 38–126)
Anion gap: 8 (ref 5–15)
BUN: 15 mg/dL (ref 6–20)
CO2: 27 mmol/L (ref 22–32)
Calcium: 9.7 mg/dL (ref 8.9–10.3)
Chloride: 106 mmol/L (ref 101–111)
Creatinine, Ser: 1.26 mg/dL — ABNORMAL HIGH (ref 0.61–1.24)
GFR calc Af Amer: >60 mL/min (ref >60)
GFR calc non Af Amer: 60 mL/min — ABNORMAL LOW (ref >60)
Glucose, Bld: 104 mg/dL — ABNORMAL HIGH (ref 65–99)
Potassium: 4.8 mmol/L (ref 3.5–5.1)
Sodium: 141 mmol/L (ref 135–145)
Total Bilirubin: 0.9 mg/dL (ref 0.3–1.2)
Total Protein: 6.4 g/dL — ABNORMAL LOW (ref 6.5–8.1)

## 2015-06-29 LAB — PROTIME-INR
INR: 1.04 (ref 0.00–1.49)
Prothrombin Time: 13.8 seconds (ref 11.6–15.2)

## 2015-06-29 LAB — APTT: aPTT: 27 seconds (ref 24–37)

## 2015-06-29 NOTE — Pre-Procedure Instructions (Signed)
Billy Romero  06/29/2015      [id number] [place] [address]Phone: [phone]:: [phone]   Your procedure is scheduled on  Tuesday  07/04/15  [facility]  Call this number if you have problems the morning of surgery:  [phone]   Remember:  Do not eat food or drink liquids after midnight.  Take these medicines the morning of surgery with A SIP OF WATER  HYDROCODONE  IF NEEDED, OMEPRAZOLE (PRILOSEC)  (STOP MELOXICAM/ MOBIC, NO IBUPROFEN/ ADVIL/ MOTRIN, GOODY POWDERS/ BC'S, VITAMINS, HERBAL MEDICINES)   Do not wear jewelry, make-up or nail polish.  Do not wear lotions, powders, or perfumes.  You may wear deodorant.  Do not shave 48 hours prior to surgery.  Men may shave face and neck.  Do not bring valuables to the hospital.  [facility] belongings or [facility].  Contacts, dentures or bridgework may not be worn into surgery.  Leave your suitcase in the car.  After surgery it may be brought to your room.  For patients admitted to the hospital, discharge time will be determined by your treatment team.  Patients discharged the day of surgery will not be allowed to drive home.   Name and phone number of your driver:    Special instructions:  [facility] Preparing for [facility]  Before surgery, you can play an important role.  Because skin is not sterile, your skin needs to be as free of germs as possible.  You can reduce the number of germs on you skin by washing with CHG (chlorahexidine gluconate) soap before surgery.  CHG is an antiseptic cleaner which kills germs and bonds with the skin to continue killing germs even after washing.  Please DO NOT use if you have an allergy to CHG or antibacterial soaps.  If your skin becomes reddened/irritated stop using the CHG and inform your nurse when you arrive at Short Stay.  Do not shave  (including legs and underarms) for at least 48 hours prior to the first CHG shower.  You may shave your face.  Please follow these instructions carefully:   1.  Shower with CHG Soap the night before surgery and the                                morning of Surgery.  2.  If you choose to wash your hair, wash your hair first as usual with your       normal shampoo.  3.  After you shampoo, rinse your hair and body thoroughly to remove the                      Shampoo.  4.  Use CHG as you would any other liquid soap.  You can apply chg directly       to the skin and wash gently with scrungie or a clean washcloth.  5.  Apply the CHG Soap to your body ONLY FROM THE NECK DOWN.        Do not use on open wounds or open sores.  Avoid contact with your eyes,       ears, mouth and genitals (private parts).  Wash genitals (private parts)       with your normal soap.  6.  Wash thoroughly, paying special attention to the area where your surgery        will be performed.  7.  Thoroughly rinse your body with warm water from the neck down.  8.  DO NOT shower/wash with your normal soap after using and rinsing off       the CHG Soap.  9.  Pat yourself dry with a clean towel.            10.  Wear clean pajamas.            11.  Place clean sheets on your bed the night of your first shower and do not        sleep with pets.  Day of Surgery  Do not apply any lotions/deoderants the morning of surgery.  Please wear clean clothes to the hospital/surgery center.    Please read over the following fact sheets that you were given. Pain Booklet, Coughing and Deep Breathing and Surgical Site Infection Prevention

## 2015-07-04 ENCOUNTER — Ambulatory Visit (HOSPITAL_COMMUNITY): Payer: BLUE CROSS/BLUE SHIELD | Admitting: Certified Registered Nurse Anesthetist

## 2015-07-04 ENCOUNTER — Ambulatory Visit (HOSPITAL_COMMUNITY): Payer: BLUE CROSS/BLUE SHIELD

## 2015-07-04 ENCOUNTER — Encounter (HOSPITAL_COMMUNITY): Payer: Self-pay | Admitting: Surgery

## 2015-07-04 ENCOUNTER — Encounter (HOSPITAL_COMMUNITY)
Admission: RE | Disposition: A | Discharge status: Home / Self Care | Payer: Self-pay | Source: Ambulatory Visit | Attending: Cardiothoracic Surgery

## 2015-07-04 ENCOUNTER — Ambulatory Visit (HOSPITAL_COMMUNITY)
Admission: RE | Admit: 2015-07-04 | Discharge: 2015-07-04 | Disposition: A | Discharge status: Home / Self Care | Payer: BLUE CROSS/BLUE SHIELD | Source: Ambulatory Visit | Attending: Cardiothoracic Surgery | Admitting: Cardiothoracic Surgery

## 2015-07-04 DIAGNOSIS — R911 Solitary pulmonary nodule: Secondary | ICD-10-CM

## 2015-07-04 DIAGNOSIS — Z8501 Personal history of malignant neoplasm of esophagus: Secondary | ICD-10-CM | POA: Diagnosis not present

## 2015-07-04 DIAGNOSIS — R590 Localized enlarged lymph nodes: Secondary | ICD-10-CM | POA: Diagnosis present

## 2015-07-04 DIAGNOSIS — C3431 Malignant neoplasm of lower lobe, right bronchus or lung: Secondary | ICD-10-CM | POA: Diagnosis not present

## 2015-07-04 DIAGNOSIS — Z923 Personal history of irradiation: Secondary | ICD-10-CM | POA: Diagnosis not present

## 2015-07-04 DIAGNOSIS — C159 Malignant neoplasm of esophagus, unspecified: Secondary | ICD-10-CM | POA: Insufficient documentation

## 2015-07-04 DIAGNOSIS — K219 Gastro-esophageal reflux disease without esophagitis: Secondary | ICD-10-CM | POA: Insufficient documentation

## 2015-07-04 HISTORY — DX: Malignant neoplasm of esophagus, unspecified: C15.9

## 2015-07-04 HISTORY — PX: VIDEO BRONCHOSCOPY WITH ENDOBRONCHIAL ULTRASOUND: SHX6177

## 2015-07-04 SURGERY — BRONCHOSCOPY, WITH EBUS
Anesthesia: General

## 2015-07-04 MED ORDER — ONDANSETRON HCL 4 MG/2ML IJ SOLN
INTRAMUSCULAR | Status: AC
  Filled 2015-07-04: qty 2

## 2015-07-04 MED ORDER — LIDOCAINE HCL (CARDIAC) 20 MG/ML IV SOLN
INTRAVENOUS | Status: AC
  Filled 2015-07-04: qty 15

## 2015-07-04 MED ORDER — SUGAMMADEX SODIUM 200 MG/2ML IV SOLN
INTRAVENOUS | Status: DC | PRN
  Administered 2015-07-04: 200 mg via INTRAVENOUS

## 2015-07-04 MED ORDER — PROPOFOL 10 MG/ML IV BOLUS
INTRAVENOUS | Status: DC | PRN
  Administered 2015-07-04: 200 mg via INTRAVENOUS

## 2015-07-04 MED ORDER — ONDANSETRON HCL 4 MG/2ML IJ SOLN
INTRAMUSCULAR | Status: DC | PRN
  Administered 2015-07-04: 50 mg via INTRAVENOUS
  Administered 2015-07-04: 4 mg via INTRAVENOUS

## 2015-07-04 MED ORDER — ROCURONIUM BROMIDE 100 MG/10ML IV SOLN
INTRAVENOUS | Status: DC | PRN
Start: 2015-07-04 — End: 2015-07-04
  Administered 2015-07-04: 40 mg via INTRAVENOUS
  Administered 2015-07-04: 10 mg via INTRAVENOUS

## 2015-07-04 MED ORDER — PHENYLEPHRINE HCL 10 MG/ML IJ SOLN
INTRAMUSCULAR | Status: DC | PRN
  Administered 2015-07-04 (×5): 80 ug via INTRAVENOUS

## 2015-07-04 MED ORDER — FENTANYL CITRATE (PF) 100 MCG/2ML IJ SOLN
INTRAMUSCULAR | Status: DC | PRN
  Administered 2015-07-04: 100 ug via INTRAVENOUS
  Administered 2015-07-04: 150 ug via INTRAVENOUS

## 2015-07-04 MED ORDER — MIDAZOLAM HCL 2 MG/2ML IJ SOLN
INTRAMUSCULAR | Status: AC
  Filled 2015-07-04: qty 2

## 2015-07-04 MED ORDER — SUGAMMADEX SODIUM 200 MG/2ML IV SOLN
INTRAVENOUS | Status: AC
  Filled 2015-07-04: qty 2

## 2015-07-04 MED ORDER — LIDOCAINE HCL (CARDIAC) 20 MG/ML IV SOLN
INTRAVENOUS | Status: DC | PRN
  Administered 2015-07-04: 50 mg via INTRAVENOUS

## 2015-07-04 MED ORDER — LACTATED RINGERS IV SOLN
INTRAVENOUS | Status: DC | PRN
  Administered 2015-07-04: 07:00:00 via INTRAVENOUS

## 2015-07-04 MED ORDER — EPINEPHRINE HCL 1 MG/ML IJ SOLN
INTRAMUSCULAR | Status: DC | PRN
  Administered 2015-07-04: 1 mg via ENDOTRACHEOPULMONARY

## 2015-07-04 MED ORDER — MIDAZOLAM HCL 5 MG/5ML IJ SOLN
INTRAMUSCULAR | Status: DC | PRN
  Administered 2015-07-04: 2 mg via INTRAVENOUS

## 2015-07-04 MED ORDER — EPINEPHRINE HCL 1 MG/ML IJ SOLN
INTRAMUSCULAR | Status: AC
  Filled 2015-07-04: qty 1

## 2015-07-04 MED ORDER — SUCCINYLCHOLINE CHLORIDE 20 MG/ML IJ SOLN
INTRAMUSCULAR | Status: AC
  Filled 2015-07-04: qty 1

## 2015-07-04 MED ORDER — SODIUM CHLORIDE 0.9 % IJ SOLN
INTRAMUSCULAR | Status: AC
  Filled 2015-07-04: qty 10

## 2015-07-04 MED ORDER — ARTIFICIAL TEARS OP OINT
TOPICAL_OINTMENT | OPHTHALMIC | Status: DC | PRN
  Administered 2015-07-04: 1 via OPHTHALMIC

## 2015-07-04 MED ORDER — FENTANYL CITRATE (PF) 250 MCG/5ML IJ SOLN
INTRAMUSCULAR | Status: AC
  Filled 2015-07-04: qty 5

## 2015-07-04 MED ORDER — PHENYLEPHRINE 40 MCG/ML (10ML) SYRINGE FOR IV PUSH (FOR BLOOD PRESSURE SUPPORT)
PREFILLED_SYRINGE | INTRAVENOUS | Status: AC
  Filled 2015-07-04: qty 10

## 2015-07-04 MED ORDER — ROCURONIUM BROMIDE 50 MG/5ML IV SOLN
INTRAVENOUS | Status: AC
  Filled 2015-07-04: qty 2

## 2015-07-04 MED ORDER — ARTIFICIAL TEARS OP OINT
TOPICAL_OINTMENT | OPHTHALMIC | Status: AC
  Filled 2015-07-04: qty 3.5

## 2015-07-04 MED ORDER — EPHEDRINE SULFATE 50 MG/ML IJ SOLN
INTRAMUSCULAR | Status: AC
  Filled 2015-07-04: qty 1

## 2015-07-04 MED ORDER — PROPOFOL 10 MG/ML IV BOLUS
INTRAVENOUS | Status: AC
  Filled 2015-07-04: qty 20

## 2015-07-04 MED ORDER — 0.9 % SODIUM CHLORIDE (POUR BTL) OPTIME
TOPICAL | Status: DC | PRN
  Administered 2015-07-04: 1000 mL

## 2015-07-04 MED ORDER — GLYCOPYRROLATE 0.2 MG/ML IJ SOLN
INTRAMUSCULAR | Status: AC
  Filled 2015-07-04: qty 2

## 2015-07-04 MED ORDER — LIDOCAINE HCL 4 % EX SOLN
CUTANEOUS | Status: DC | PRN
  Administered 2015-07-04: 4 mL via TOPICAL

## 2015-07-04 SURGICAL SUPPLY — 23 items
BRUSH CYTOL CELLEBRITY 1.5X140 (MISCELLANEOUS) ×2 IMPLANT
CANISTER SUCTION 2500CC (MISCELLANEOUS) ×2 IMPLANT
CONT SPEC 4OZ CLIKSEAL STRL BL (MISCELLANEOUS) ×4 IMPLANT
COVER DOME SNAP 22 D (MISCELLANEOUS) ×2 IMPLANT
COVER TABLE BACK 60X90 (DRAPES) ×2 IMPLANT
FORCEPS BIOP RJ4 1.8 (CUTTING FORCEPS) ×2 IMPLANT
GAUZE SPONGE 4X4 12PLY STRL (GAUZE/BANDAGES/DRESSINGS) ×2 IMPLANT
GLOVE BIO SURGEON STRL SZ 6.5 (GLOVE) ×2 IMPLANT
KIT CLEAN ENDO COMPLIANCE (KITS) ×4 IMPLANT
KIT ROOM TURNOVER OR (KITS) ×2 IMPLANT
MARKER SKIN DUAL TIP RULER LAB (MISCELLANEOUS) ×2 IMPLANT
NEEDLE BIOPSY TRANSBRONCH 21G (NEEDLE) IMPLANT
NEEDLE BLUNT 18X1 FOR OR ONLY (NEEDLE) IMPLANT
NEEDLE EBUS SONO TIP PENTAX (NEEDLE) ×2 IMPLANT
NEEDLE SONO TIP II EBUS (NEEDLE) ×2 IMPLANT
NS IRRIG 1000ML POUR BTL (IV SOLUTION) ×2 IMPLANT
OIL SILICONE PENTAX (PARTS (SERVICE/REPAIRS)) ×2 IMPLANT
PAD ARMBOARD 7.5X6 YLW CONV (MISCELLANEOUS) ×4 IMPLANT
SYR 20CC LL (SYRINGE) ×2 IMPLANT
SYR 20ML ECCENTRIC (SYRINGE) ×2 IMPLANT
TOWEL OR 17X24 6PK STRL BLUE (TOWEL DISPOSABLE) ×2 IMPLANT
TRAP SPECIMEN MUCOUS 40CC (MISCELLANEOUS) ×2 IMPLANT
TUBE CONNECTING 20X1/4 (TUBING) ×2 IMPLANT

## 2015-07-04 NOTE — Anesthesia Procedure Notes (Signed)
Procedure Name: Intubation Date/Time: 07/04/2015 7:36 AM Performed by: Mariea Clonts Pre-anesthesia Checklist: Emergency Drugs available, Timeout performed, Suction available, Patient identified and Patient being monitored Patient Re-evaluated:Patient Re-evaluated prior to inductionOxygen Delivery Method: Circle system utilized Preoxygenation: Pre-oxygenation with 100% oxygen Intubation Type: IV induction Ventilation: Mask ventilation without difficulty Laryngoscope Size: Miller and 3 Grade View: Grade II Tube type: Oral Tube size: 8.5 mm Number of attempts: 1 Airway Equipment and Method: LTA kit utilized Placement Confirmation: ETT inserted through vocal cords under direct vision,  breath sounds checked- equal and bilateral and positive ETCO2 Tube secured with: Tape Dental Injury: Teeth and Oropharynx as per pre-operative assessment

## 2015-07-04 NOTE — Transfer of Care (Signed)
Immediate Anesthesia Transfer of Care Note  Patient: Billy Romero  Procedure(s) Performed: Procedure(s): VIDEO BRONCHOSCOPY WITH ENDOBRONCHIAL ULTRASOUND with brushings and biopsies. (N/A)  Patient Location: PACU  Anesthesia Type:General  Level of Consciousness: awake, alert  and oriented  Airway & Oxygen Therapy: Patient Spontanous Breathing and Patient connected to nasal cannula oxygen  Post-op Assessment: Report given to RN, Post -op Vital signs reviewed and stable and Patient moving all extremities X 4  Post vital signs: Reviewed and stable  Last Vitals:  Filed Vitals:   07/04/15 0915 07/04/15 0930  BP: 106/64 103/52  Pulse: 72 68  Temp: 36 C   Resp: 10 17    Complications: No apparent anesthesia complications

## 2015-07-04 NOTE — H&P (Signed)
Patient ID: Billy Romero, male   DOB: July 18, 1955, 60 y.o.   MRN: [id number]      [address]       [place] [address]             [phone]        [facility] Medical Record #[id number] Date of Birth: 1955/10/09  Referring:Dr B Sherrill Primary Care: Lujean Amel, MD  Chief Complaint:   Obstructing Squamous Cell Esophageal Cancer  History of Present Illness:     Billy Romero is a 60 y.o. 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  He has had many year history of reflux symptoms, since teenager. He presented in dec 2015 with several months he has had increasing difficulty swallowing. Outpatient Endoscopy done by Dr Earlean Shawl demonstrated obstruction squamous cell ca 28-30 cm. Admitted from [facility] due to inability to eat or drink and dehydration.  He has now completed course of chemotherapy and radiation with significant improvement in swallowing. J tube is now removed. He has repeat pet scan scheduled for next week. Weekly Taxol/carboplatin 5 completed 05/25/2014. Radiation completed 06/10/2014   He underwent an upper endoscopy 04/20/2015. The proximal esophagus appeared normal. The mid esophageal segment from 29-32 cm was mildly narrowed from stricturing. The mucosa was intact and normal appearing except for 2 small nodules, each approximately 5 mm in diameter with slight erythema and central pallor. No ulceration was present. Biopsy of the 2 mucosal nodules and random biopsies in the 3 cm segment were obtained. Mid esophagus biopsy showed scant and detached fragments with high-grade squamous dysplasia. Esophageal brushings returned negative for malignant cells and no fungal organisms present.   He feels well. He has a good appetite. He is gaining weight. He remains very active. No dysphagia or odynophagia. No shortness of breath. He denies pain.  Current Activity/ Functional  Status: Patient is independent with mobility/ambulation, transfers, ADL's, IADL's.   Zubrod Score: At the time of surgery this patient's most appropriate activity status/level should be described as: '[x]'$     0    Normal activity, no symptoms '[]'$     1    Restricted in physical strenuous activity but ambulatory, able to do out light work '[]'$     2    Ambulatory and capable of self care, unable to do work activities, up and about                 more than 50%  Of the time                            '[]'$     3    Only limited self care, in bed greater than 50% of waking hours '[]'$     4    Completely disabled, no self care, confined to bed or chair '[]'$     5    Moribund  Past Medical History  Diagnosis Date  . Esophageal cancer ([place]) 03/29/14  . Complication of anesthesia     WOKE UP DURING COLONOSCOPY  . GERD (gastroesophageal reflux disease)     Past Surgical History  Procedure Laterality Date  . Peg placement N/A 04/06/2014    Procedure: OPEN JEJUNOSTOMY TUBE PLACEMENT;  Surgeon: Armandina Gemma, MD;  Location: [facility];  Service: General;  Laterality: N/A;  . Appendectomy    . Knee arthroscopy  LEFT  . Eye surgery      PTEGERIUM   BIL EYES  . Cervical fusion    . Hand surgery      DUPRAGENS CONTRACTURE  RT HAND   Previous cervical spine surgery vis left anterior cervical approach  History  Smoking status  . Never Smoker   Smokeless tobacco  .  uses dip/snuff for 25-30 years    History  Alcohol Use No  pervious  Heavy alcohol use, treated with in AA, has been sober for past 9 years  History  Family History  father and paternal grandfather died of leukemia one chronic one acute , both at age 31. No other family history of cancer. He has one brother, 1 sister, one half sister, and 2 half-brothers   Social History  . Marital Status: Married    Spouse Name: N/A    Number of Children: 2  . Years of Education: N/A   Occupational History  . Retired June 2015 form [employer]  . Smoking status: Never Smoker   . Smokeless tobacco: Not on file  . Alcohol Use: No  . Drug Use: Not on file  . Sexual Activity: Not on file      No Known Allergies  No current facility-administered medications for this encounter.    History reviewed. No pertinent family history.   Review of Systems:     Cardiac Review of Systems: Y or N  Chest Pain [ n   ]  Resting SOB [n   ] Exertional SOB  [n  ]  Orthopnea [ n ]   Pedal Edema [ n  ]    Palpitations [n  ] Syncope  [n  ]   Presyncope [ n  ]  General Review of Systems: [Y] = yes [  ]=no Constitional: recent weight change Blue.Reese  ]; anorexia Blue.Reese  ]; fatigue [  ]; nausea Blue.Reese  ]; night sweats [ n ]; fever [ n ]; or chills [n  ]                                                               Dental: poor dentition[ n ]; Last Dentist visit:   Eye : blurred vision [  ]; diplopia [   ]; vision changes [  ];  Amaurosis fugax[  ]; Resp: cough [  ];  wheezing[n  ];  hemoptysis[ n ]; shortness of breath[n  ]; paroxysmal nocturnal dyspnea[n  ]; dyspnea on exertion[  ]; or orthopnea[  ];  GI:  gallstones[n  ], vomiting[n  ];  dysphagia[  ]; melena[  ];  hematochezia [ n ]; heartburn[  ];   Hx of  Colonoscopy[ y ]; GU: kidney stones [  ]; hematuria[  ];   dysuria [  ];  nocturia[  ];  history of     obstruction [  ]; urinary frequency [  ]             Skin: rash, swelling[  ];, hair loss[  ];  peripheral edema[  ];  or itching[  ]; Musculosketetal: myalgias[  ];  joint swelling[  ];  joint erythema[  ];  joint pain[  ];  back pain[  ];  Heme/Lymph:  bruising[ n ];  bleeding[n  ];  anemia[  ];  Neuro: TIA[ n ];  headaches[  ];  stroke[  ];  vertigo[  ];  seizures[  ];   paresthesias[  ];  difficulty walking[  ];  Psych:depression[  ]; anxiety[  ];  Endocrine: diabetes[n  ];  thyroid dysfunction[ n ];  Immunizations: Flu [ n ]; Pneumococcal[n  ];  Other:  Physical Exam: BP 110/60 mmHg  Pulse 58  Temp(Src) 98 F  (36.7 C) (Oral)  Ht '5\' 11"'$  (1.803 m)  Wt 207 lb (93.895 kg)  BMI 28.88 kg/m2  SpO2 100%  General appearance: alert, cooperative and appears stated age Neurologic: intact Heart: regular rate and rhythm, S1, S2 normal, no murmur, click, rub or gallop Abdomen: soft, non-tender; bowel sounds normal; no masses,  no organomegaly Extremities: extremities normal, atraumatic, no cyanosis or edema and Homans sign is negative, no sign of DVT Wound: feeding j tube placed to left of midline abdomen functioning No cervical or supraclavicular adenopathy Full pedal pulses bilaterial  Diagnostic Studies & Laboratory data:     Recent Radiology Findings:   Dg Chest 2 View  07/04/2015  CLINICAL DATA:  Preoperative for bronchoscopy.  Lung nodule. EXAM: CHEST  2 VIEW COMPARISON:  PET-CT 06/16/2015 FINDINGS: Normal heart size and pulmonary vascularity. No focal airspace disease or consolidation in the lungs. No blunting of costophrenic angles. No pneumothorax. Mediastinal contours appear intact. The lung nodules seen at CT are not visible on plain film. Degenerative changes in the spine. Postoperative changes in the cervical spine. IMPRESSION: No active cardiopulmonary disease. Electronically Signed   By: Lucienne Capers M.D.   On: 07/04/2015 06:06   Nm Pet Image Restag (ps) Skull Base To Thigh  06/16/2015  CLINICAL DATA:  Subsequent treatment strategy for esophageal cancer with new lung metastases. EXAM: NUCLEAR MEDICINE PET SKULL BASE TO THIGH TECHNIQUE: 8.85 mCi F-18 FDG was injected intravenously. Full-ring PET imaging was performed from the skull base to thigh after the radiotracer. CT data was obtained and used for attenuation correction and anatomic localization. FASTING BLOOD GLUCOSE:  Value: 99 mg/dl COMPARISON:  PET-CT dated 07/01/2014. CT chest abdomen pelvis dated 05/25/2015. FINDINGS: NECK No hypermetabolic lymph nodes in the neck. CHEST Bilateral pulmonary nodules, suspicious for metastases, including:  --5 mm nodule in the central left upper lobe (series 6/ image 21), max SUV 2.7 --10 mm cavitary nodule in the posterior left upper lobe (series 6/ image 26), max SUV 5.4 --10 mm nodule at the medial right lung base (series 6/ image 51), max SUV 5.8 Additional hypermetabolism with interstitial soft tissue thickening along the right infrahilar region (series 6/image 36), max SUV 9.7. No focal consolidation.  No pleural effusion or pneumothorax. The heart is normal in size.  No pericardial effusion. 7 mm short axis subcarinal node (series 4/ image 82), max SUV 6.1. 6 mm short axis node adjacent to the thoracic spine in posterior to the descending thoracic aorta (series 4/ image 89), max SUV 6.3. Prior wall thickening with hypermetabolism along the mid/distal esophagus has improved. ABDOMEN/PELVIS No abnormal hypermetabolic activity within the liver, pancreas, adrenal glands, or spleen. Mild atherosclerotic calcifications the abdominal aorta. Prostatomegaly. No hypermetabolic lymph nodes in the abdomen or pelvis. SKELETON No focal hypermetabolic activity to suggest skeletal metastasis. IMPRESSION: Prior wall thickening hypermetabolism along the mid/distal esophagus has improved. 10 mm nodule at the medial right lung base and two left upper lobe pulmonary nodules measuring up to 10 mm, with associated hypermetabolism,  suspicious metastasis. Additional hypermetabolism with interstitial soft tissues thickening along the right infrahilar region. 7 mm short axis subcarinal node and a 6 mm short axis lateral thoracic node posterior to the descending thoracic aorta, with associated hypermetabolism, suspicious for nodal metastases. Electronically Signed   By: Julian Hy M.D.   On: 06/16/2015 15:39   PET in 04/2014  CLINICAL DATA: Initial treatment strategy for esophageal carcinoma.  EXAM: NUCLEAR MEDICINE PET SKULL BASE TO THIGH  TECHNIQUE: 10.0 mCi F-18 FDG was injected intravenously. Full-ring PET  imaging was performed from the skull base to thigh after the radiotracer. CT data was obtained and used for attenuation correction and anatomic localization.  FASTING BLOOD GLUCOSE: Value: 86 mg/dl  COMPARISON: Chest CT on 04/10/2014 and AP CT on 04/08/2014  FINDINGS: NECK  9 mm left supraclavicular lymph node on image 52/series for is hypermetabolic, with SUV max of 8.9. No other hypermetabolic cervical lymph nodes identified.  CHEST  Hypermetabolic mass is seen in the mid thoracic esophagus with proximal esophageal dilatation. This mass has SUV max of 24.2. This is consistent with primary esophageal carcinoma.  Hypermetabolic mediastinal lymphadenopathy is seen along the posterior aspect of the esophageal mass and in the high right paratracheal and prevascular regions of the mediastinum. The high right paratracheal lymph node measures 2.3 cm on image 57/series 4 with SUV max of 8.8.  No suspicious pulmonary nodules seen by CT.  ABDOMEN/PELVIS  No abnormal hypermetabolic activity within the liver, pancreas, adrenal glands, or spleen. No hypermetabolic lymph nodes in the abdomen or pelvis. Percutaneous jejunostomy tube noted.  SKELETON  No focal hypermetabolic activity to suggest skeletal metastasis.  IMPRESSION: Hypermetabolic mid thoracic esophageal mass, consistent with primary esophageal carcinoma.  Hypermetabolic metastatic lymphadenopathy in mediastinum and left supraclavicular region.  No evidence of metastatic disease in abdomen or pelvis.   Electronically Signed  By: Earle Gell M.D.  On: 05/03/2014 15:36 I have independently reviewed the above radiology studies  and reviewed the findings with the patient.   Recent Lab Findings: Lab Results  Component Value Date   WBC 4.9 06/29/2015   HGB 14.4 06/29/2015   HCT 40.9 06/29/2015   PLT 148* 06/29/2015   GLUCOSE 104* 06/29/2015   TRIG 67 04/18/2014   ALT 19 06/29/2015   AST 26  06/29/2015   NA 141 06/29/2015   K 4.8 06/29/2015   CL 106 06/29/2015   CREATININE 1.26* 06/29/2015   BUN 15 06/29/2015   CO2 27 06/29/2015   TSH 0.523 04/05/2014   INR 1.04 06/29/2015   Patient has not had EUS, with this limitation suspect clinical  IIIB disease with extensive involvement of mediastinal and left subclavian nodes that would not be in operative field.   ENDOSCOPY:Status post upper endoscopy 04/20/2015. Proximal esophagus appeared normal. The mid esophageal segment from 29-32 cm was mildly narrowed from stricturing. The mucosa was intact and normal appearing except for 2 small nodules, each approximately 5 mm in diameter with slight erythema and central pallor. No ulceration was present. Biopsy of the 2 mucosal nodules and random biopsies in the 3 cm segment were obtained. Mid esophagus biopsy showed scant and detached fragments with high-grade squamous dysplasia. Esophageal brushings returned negative for malignant cells and no fungal organisms present.    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 Patient completed course of chemotherapy and radiation with excellent response as far as functional status and swallowing ability. Follow-up  CT scans and PET scan now suggest recurrent disease with 3 small lung nodules one on the right to left of right hilar adenopathy hypermetabolic. PET scan is suggestive of pulmonary metastasis.  I discussed with the patient the small size of the lung nodules and their location makes reliable biopsy difficult. We can proceed with bronchoscopy ebus and attempt to obtain tissue diagnosis from the right hilar nodes.  The goals risks and alternatives of the planned surgical procedure bronchoscopy ebus and attempt to obtain tissue diagnosis from the right hilar nodes.   have been discussed with the patient in detail. The risks of the procedure including death,  infection, stroke, myocardial infarction, bleeding, blood transfusion have all been discussed specifically.  I have quoted [name] a 1 % of perioperative mortality and a complication rate as high as 10%. The patient's questions have been answered.STEVE YOUNGBERG is willing  to proceed with the planned procedure.   Grace Isaac MD      [address] [place] [address] Office [phone]   Beeper [phone]  07/04/2015 7:23 AM

## 2015-07-04 NOTE — Anesthesia Postprocedure Evaluation (Signed)
Anesthesia Post Note  Patient: Billy Romero  Procedure(s) Performed: Procedure(s) (LRB): VIDEO BRONCHOSCOPY WITH ENDOBRONCHIAL ULTRASOUND with brushings and biopsies. (N/A)  Patient location during evaluation: PACU Anesthesia Type: General Level of consciousness: awake and alert Pain management: pain level controlled Vital Signs Assessment: post-procedure vital signs reviewed and stable Respiratory status: spontaneous breathing, nonlabored ventilation, respiratory function stable and patient connected to nasal cannula oxygen Cardiovascular status: blood pressure returned to baseline and stable Postop Assessment: no signs of nausea or vomiting Anesthetic complications: no    Last Vitals:  Filed Vitals:   07/04/15 0915 07/04/15 0930  BP: 106/64 103/52  Pulse: 72 68  Temp: 36 C   Resp: 10 17    Last Pain:  Filed Vitals:   07/04/15 0930  PainSc: 0-No pain                 Clancey Welton,JAMES TERRILL

## 2015-07-04 NOTE — Anesthesia Preprocedure Evaluation (Addendum)
Anesthesia Evaluation  Patient identified by MRN, date of birth, ID band Patient awake    History of Anesthesia Complications (+) history of anesthetic complications  Airway Mallampati: II  TM Distance: >3 FB Neck ROM: Full    Dental  (+) Teeth Intact   Pulmonary    breath sounds clear to auscultation       Cardiovascular  Rhythm:Regular Rate:Normal     Neuro/Psych    GI/Hepatic Neg liver ROS, GERD  ,Esophageal ca   Endo/Other    Renal/GU negative Renal ROS     Musculoskeletal   Abdominal   Peds  Hematology negative hematology ROS (+)   Anesthesia Other Findings   Reproductive/Obstetrics                            Anesthesia Physical Anesthesia Plan  ASA: III  Anesthesia Plan: General   Post-op Pain Management:    Induction: Intravenous  Airway Management Planned: Oral ETT  Additional Equipment:   Intra-op Plan:   Post-operative Plan: Extubation in OR  Informed Consent: I have reviewed the patients History and Physical, chart, labs and discussed the procedure including the risks, benefits and alternatives for the proposed anesthesia with the patient or authorized representative who has indicated his/her understanding and acceptance.   Dental advisory given  Plan Discussed with: CRNA  Anesthesia Plan Comments:         Anesthesia Quick Evaluation

## 2015-07-04 NOTE — Discharge Instructions (Signed)
Flexible Bronchoscopy, Care After Refer to this sheet in the next few weeks. These instructions provide you with information on caring for yourself after your procedure. Your health care provider may also give you more specific instructions. Your treatment has been planned according to current medical practices, but problems sometimes occur. Call your health care provider if you have any problems or questions after your procedure.  WHAT TO EXPECT AFTER THE PROCEDURE It is normal to have the following symptoms for 24-48 hours after the procedure:   Increased cough.  Low-grade fever.  Sore throat or hoarse voice.  Small streaks of blood in your thick spit (sputum) if tissue samples were taken (biopsy). HOME CARE INSTRUCTIONS   Do not eat or drink anything for 2 hours after your procedure. Your nose and throat were numbed by medicine. If you try to eat or drink before the medicine wears off, food or drink could go into your lungs or you could burn yourself. After the numbness is gone and your cough and gag reflexes have returned, you may eat soft food and drink liquids slowly.   The day after the procedure, you can go back to your normal diet.   You may resume normal activities.   Keep all follow-up visits as directed by your health care provider. It is important to keep all your appointments, especially if tissue samples were taken for testing (biopsy). SEEK IMMEDIATE MEDICAL CARE IF:   You have increasing shortness of breath.   You become light-headed or faint.   You have chest pain.   You have any new concerning symptoms.  You cough up more than a small amount of blood.  The amount of blood you cough up increases. MAKE SURE YOU:  Understand these instructions.  Will watch your condition.  Will get help right away if you are not doing well or get worse.   This information is not intended to replace advice given to you by your health care provider. Make sure you discuss  any questions you have with your health care provider.   Document Released: 11/02/2004 Document Revised: 05/06/2014 Document Reviewed: 12/18/2012 Elsevier Interactive Patient Education [employer].

## 2015-07-04 NOTE — Brief Op Note (Addendum)
      [address]       ,[place] [address]             [phone]      07/04/2015  9:02 AM  PATIENT:  Billy Romero  60 y.o. male  PRE-OPERATIVE DIAGNOSIS:  LUNG NODULE and mediastinal adenopathy history of esophageal Ca  POST-OPERATIVE DIAGNOSIS:  Same, by quick stain malignant cells present    PROCEDURE:  Procedure(s): VIDEO BRONCHOSCOPY WITH ENDOBRONCHIAL ULTRASOUND with brushings and biopsies. (N/A) Transbronchial biopsy of 10r nodes  See photo in epic media section  SURGEON:  Surgeon(s) and Role:    * Grace Isaac, MD - Primary   ANESTHESIA:   general  EBL:  Total I/O In: 700 [I.V.:700] Out: -   BLOOD ADMINISTERED:none  DRAINS: none   LOCAL MEDICATIONS USED:  XYLOCAINE   SPECIMEN:  Source of Specimen:  10 r nodes , right lower bonchial biopsy and brushings   DISPOSITION OF SPECIMEN:  PATHOLOGY  COUNTS:  YES  DICTATION: .Dragon Dictation  PLAN OF CARE: Discharge to home after PACU  PATIENT DISPOSITION:  PACU - hemodynamically stable.   Delay start of Pharmacological VTE agent (>24hrs) due to surgical blood loss or risk of bleeding: yes

## 2015-07-05 ENCOUNTER — Encounter (HOSPITAL_COMMUNITY): Payer: Self-pay | Admitting: Cardiothoracic Surgery

## 2015-07-05 NOTE — Op Note (Signed)
NAME:  Billy Romero, Billy Romero NO.:  [id number]  MEDICAL RECORD NO.:  [id number]  LOCATION:                                 FACILITY:  PHYSICIAN:  Lanelle Bal, MD    DATE OF BIRTH:  Nov 01, 1955  DATE OF PROCEDURE:  07/04/2015 DATE OF DISCHARGE:                              OPERATIVE REPORT   PREOPERATIVE DIAGNOSIS:  History of esophageal cancer with mediastinal adenopathy.  POSTOPERATIVE DIAGNOSIS:  History of esophageal cancer with mediastinal adenopathy.  Evidence of malignancy on needle biopsy of 10R nodes.  PROCEDURE PERFORMED:  Bronchoscopy with EBUS transbronchial biopsies of 10R lymph nodes.  SURGEON:  Lanelle Bal, MD  BRIEF HISTORY:  The patient is a 60 year old male with known esophageal carcinoma, advanced age who had previously undergone radiation and chemotherapy and now presents on followup CT scan and PET scan with hypermetabolic right hilar adenopathy and multiple small lung nodules to obtain a tissue diagnosis, bronchoscopy with EBUS and biopsy was recommended to the patient.  He agreed and signed informed consent.  DESCRIPTION OF PROCEDURE:  The patient underwent general endotracheal anesthesia without incident.  Appropriate time-out was performed.  Then we protrude proceeded with standard fiberoptic video bronchoscopy to the subsegmental level both in the right and left tracheobronchial tree. The trachea was appeared normal.  The left tracheobronchial tree was without lesions.  On examination of the right, after the takeoff of the right middle lobe, there was area just at the takeoff of the right lower lobe bronchus and superior segment with significant erythema and some narrowing of the superior segment.  Photographs of the area are in the media tab of EPIC.  Guided by the patient's PET scan, we then proceeded with EBUS with the EBUS scope, significant nodes along with 10R area were identified and multiple passes with aspirating needle to  the bronchial wall were carried out, some done as quick smears and the remaining tissue sent inside light.  After multiple passes and review of the initial quick stain, there was diagnostic material suggestive of non- small cell carcinoma.  We also then performed brushings of the right lower lobe bronchus and multiple biopsies with a small biopsy forceps of the mucosa of the abnormal appearing area in the right lower lobe bronchus.  The patient tolerated the procedure without complications.  Scope was removed.  He was extubated in the operating room and transferred to the recovery room for further postoperative care, having tolerated the procedure without complication.     Lanelle Bal, MD     EG/MEDQ  D:  07/05/2015  T:  07/05/2015  Job:  [id number]  cc:   Dr. Phoebe Sharps

## 2015-07-06 ENCOUNTER — Other Ambulatory Visit: Payer: Self-pay | Admitting: Nurse Practitioner

## 2015-07-07 ENCOUNTER — Telehealth: Payer: Self-pay | Admitting: Oncology

## 2015-07-07 ENCOUNTER — Telehealth: Payer: Self-pay | Admitting: *Deleted

## 2015-07-07 NOTE — Telephone Encounter (Signed)
lvm for pt regarding to cx 3.16 appt moved to 3.17

## 2015-07-07 NOTE — Telephone Encounter (Signed)
Per staff message and POF I have scheduled appts. Advised scheduler of appts. JMW  

## 2015-07-13 ENCOUNTER — Ambulatory Visit: Payer: BLUE CROSS/BLUE SHIELD | Admitting: Nurse Practitioner

## 2015-07-14 ENCOUNTER — Ambulatory Visit (HOSPITAL_BASED_OUTPATIENT_CLINIC_OR_DEPARTMENT_OTHER): Payer: BLUE CROSS/BLUE SHIELD | Admitting: Oncology

## 2015-07-14 ENCOUNTER — Telehealth: Payer: Self-pay | Admitting: *Deleted

## 2015-07-14 ENCOUNTER — Telehealth: Payer: Self-pay | Admitting: Oncology

## 2015-07-14 VITALS — BP 122/73 | HR 60 | Temp 98.3°F | Resp 18 | Ht 71.0 in | Wt 208.5 lb

## 2015-07-14 DIAGNOSIS — C779 Secondary and unspecified malignant neoplasm of lymph node, unspecified: Secondary | ICD-10-CM | POA: Diagnosis not present

## 2015-07-14 DIAGNOSIS — C154 Malignant neoplasm of middle third of esophagus: Secondary | ICD-10-CM

## 2015-07-14 DIAGNOSIS — C7801 Secondary malignant neoplasm of right lung: Secondary | ICD-10-CM

## 2015-07-14 NOTE — Patient Instructions (Signed)
Pembrolizumab injection What is this medicine? PEMBROLIZUMAB (pem broe liz ue mab) is a monoclonal antibody. It is used to treat melanoma and non-small cell lung cancer. This medicine may be used for other purposes; ask your health care provider or pharmacist if you have questions. What should I tell my health care provider before I take this medicine? They need to know if you have any of these conditions: -diabetes -immune system problems -inflammatory bowel disease -liver disease -lung or breathing disease -lupus -an unusual or allergic reaction to pembrolizumab, other medicines, foods, dyes, or preservatives -pregnant or trying to get pregnant -breast-feeding How should I use this medicine? This medicine is for infusion into a vein. It is given by a health care professional in a hospital or clinic setting. A special MedGuide will be given to you before each treatment. Be sure to read this information carefully each time. Talk to your pediatrician regarding the use of this medicine in children. Special care may be needed. Overdosage: If you think you have taken too much of this medicine contact a poison control center or emergency room at once. NOTE: This medicine is only for you. Do not share this medicine with others. What if I miss a dose? It is important not to miss your dose. Call your doctor or health care professional if you are unable to keep an appointment. What may interact with this medicine? Interactions have not been studied. Give your health care provider a list of all the medicines, herbs, non-prescription drugs, or dietary supplements you use. Also tell them if you smoke, drink alcohol, or use illegal drugs. Some items may interact with your medicine. This list may not describe all possible interactions. Give your health care provider a list of all the medicines, herbs, non-prescription drugs, or dietary supplements you use. Also tell them if you smoke, drink alcohol, or  use illegal drugs. Some items may interact with your medicine. What should I watch for while using this medicine? Your condition will be monitored carefully while you are receiving this medicine. You may need blood work done while you are taking this medicine. Do not become pregnant while taking this medicine or for 4 months after stopping it. Women should inform their doctor if they wish to become pregnant or think they might be pregnant. There is a potential for serious side effects to an unborn child. Talk to your health care professional or pharmacist for more information. Do not breast-feed an infant while taking this medicine or for 4 months after the last dose. What side effects may I notice from receiving this medicine? Side effects that you should report to your doctor or health care professional as soon as possible: -allergic reactions like skin rash, itching or hives, swelling of the face, lips, or tongue -bloody or black, tarry stools -breathing problems -change in the amount of urine -changes in vision -chest pain -chills -dark urine -dizziness or feeling faint or lightheaded -fast or irregular heartbeat -fever -flushing -hair loss -muscle pain -muscle weakness -persistent headache -signs and symptoms of high blood sugar such as dizziness; dry mouth; dry skin; fruity breath; nausea; stomach pain; increased hunger or thirst; increased urination -signs and symptoms of liver injury like dark urine, light-colored stools, loss of appetite, nausea, right upper belly pain, yellowing of the eyes or skin -stomach pain -weight loss Side effects that usually do not require medical attention (Report these to your doctor or health care professional if they continue or are bothersome.):constipation -cough -diarrhea -joint pain -  tiredness This list may not describe all possible side effects. Call your doctor for medical advice about side effects. You may report side effects to FDA at  1-800-FDA-1088. Where should I keep my medicine? This drug is given in a hospital or clinic and will not be stored at home. NOTE: This sheet is a summary. It may not cover all possible information. If you have questions about this medicine, talk to your doctor, pharmacist, or health care provider.    2016, Elsevier/Gold Standard. (2014-06-14 17:24:19)

## 2015-07-14 NOTE — Telephone Encounter (Signed)
Spoke with Tammy in pathology at [facility]: Requested PDL-1 testing on lung biopsy [id number], per Dr. Benay Spice.

## 2015-07-14 NOTE — Telephone Encounter (Signed)
Gave patient avs report and appointments for March and April.  °

## 2015-07-14 NOTE — Progress Notes (Signed)
Billy Romero OFFICE PROGRESS NOTE   Diagnosis:  Esophagus cancer  INTERVAL HISTORY:    Billy Romero returns as scheduled. He underwent a bronchoscopy/ EBUS Dr. Servando Snare on 07/04/2015. The trachea and left tracheobronchial tree appeared normal. At the takeoff of the right lower lobe bronchus and superior segment he was erythema and narrowing. Level 10 R nodes were biopsied. There were brushings of the right lower lobe bronchus and multiple biopsies of the abnormal appearing mucosa.   The pathology from the right lower lobe biopsy revealed squamous cell carcinoma. The cytology from the level 10 R node also showed squamous cell carcinoma. Washings from the right lung revealed non-small cell carcinoma. Right lower lobe brushings also revealed squamous cell carcinoma. A bronchial lavage specimen revealed benign changes.  Billy Romero feels well. No dysphagia. He is playing golf. No complaint.  Objective:  Vital signs in last 24 hours:  Blood pressure 122/73, pulse 60, temperature 98.3 F (36.8 C), resp. rate 18, height '5\' 11"'$  (1.803 m), weight 208 lb 8 oz (94.575 kg), SpO2 99 %.    HEENT:  Neck without mass Lymphatics:  No cervical or supraclavicular nodes Resp:  Lungs clear bilaterally Cardio:  Regular rate and rhythm GI:  No hepatomegaly, nontender, no mass Vascular:  No leg edema   Lab Results:  Lab Results  Component Value Date   WBC 4.9 06/29/2015   HGB 14.4 06/29/2015   HCT 40.9 06/29/2015   MCV 87.4 06/29/2015   PLT 148* 06/29/2015   NEUTROABS 3.5 06/15/2014     Medications: I have reviewed the patient's current medications.  Assessment/Plan: 1. 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 Radiation completed 06/10/2014  PET scan 07/01/2014 revealed improvement in the hypermetabolic esophagus mass and mediastinal lymphadenopathy   upper endoscopy 04/20/2015 - mid esophagus with mild narrowing , 2 small nodules measuring approximate 5 mm -biopsy with scant and detached fragments with high-grade squamous dysplasia , brushings negative for malignant cells.  CT chest/abdomen/pelvis 05/25/2015 with development of bilateral pulmonary nodules; paratracheal and subcarinal nodes improved/similar; new small lower paraesophageal nodes which are indeterminate; esophageal wall thickening improved since the prior PET; no evidence of metastatic disease in the abdomen or pelvis.  PET scan 06/16/2015 revealed hypermetabolic lung nodules, hypermetabolic mediastinal nodes, hypermetabolism at the mid to distal esophagus has improved   bronchoscopy/EBUS on 07/04/2015 confirmed squamous cell carcinoma involving right lower lobe brushings, right bronchial washings, a level 10 R lymph node biopsy, and right lower lobe biopsy  2. Solid/liquid dysphagia secondary to #1  Jejunostomy feeding tube placement 04/07/2014  Feeding tube removed 05/30/2014.  3. Small bowel obstruction following placement of the jejunostomy feeding tube-resolved  4. History of ulcerative colitis  5. Aspiration pneumonia December 2015    Disposition:   Billy Romero appears well. The bronchoscopy confirmed squamous cell carcinoma involving the right lower lobe bronchus,  And a level 10 R node.  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 He was diagnosed with esophagus cancer approximate 1 year ago and was found to have changes suspicious for local recurrence of esophagus cancer on the endoscopy 04/20/2015.  Billy Romero appears asymptomatic and has a low tumor burden.  We discussed salvage systemic  chemotherapy and immunotherapy options. I favor a trial of immunotherapy since he is asymptomatic.  Hopefully his quality of life will be preserved with  Immunotherapy.We will submit the tumor for PDL 1 testing. I explained that PD1 inhibitors have not been FDA approved in metastatic esophagus cancer, but there is preliminary evidence that these agents can be effective in some patients. We will try to get approval for pembrolizumab from his insurance, and the or [employer]   We reviewed the potential toxicities associated with this agent  And he agrees to proceed. He was given re-materials on pembrolizumab. A first treatment has been scheduled for 07/26/2015.  Billy Romero will return for an office visit on 07/26/2015.  Betsy Coder, MD  07/14/2015  3:42 PM

## 2015-07-21 ENCOUNTER — Encounter (HOSPITAL_COMMUNITY): Payer: Self-pay

## 2015-07-24 ENCOUNTER — Other Ambulatory Visit: Payer: Self-pay | Admitting: *Deleted

## 2015-07-24 MED ORDER — HYDROCODONE-ACETAMINOPHEN 5-325 MG PO TABS: 1.0000 | ORAL_TABLET | Freq: Two times a day (BID) | ORAL | Status: DC | PRN

## 2015-07-25 ENCOUNTER — Encounter: Payer: Self-pay | Admitting: Oncology

## 2015-07-25 NOTE — Telephone Encounter (Signed)
Message from pt's wife asking if his Beryle Flock has been approved. Informed her the insurance company denied Beryle Flock, pt has been enrolled in the drug company's program so he may come in as scheduled for tx on 3/28. Informed her Hydrocodone Rx is ready for pick up. She voiced appreciation for call.

## 2015-07-25 NOTE — Progress Notes (Signed)
Left keytruda form for dr.sherrill to sign-- off label use. Note for patient to see me for signature

## 2015-07-26 ENCOUNTER — Ambulatory Visit (HOSPITAL_BASED_OUTPATIENT_CLINIC_OR_DEPARTMENT_OTHER): Payer: BLUE CROSS/BLUE SHIELD | Admitting: Nurse Practitioner

## 2015-07-26 ENCOUNTER — Ambulatory Visit (HOSPITAL_BASED_OUTPATIENT_CLINIC_OR_DEPARTMENT_OTHER): Payer: BLUE CROSS/BLUE SHIELD

## 2015-07-26 ENCOUNTER — Telehealth: Payer: Self-pay | Admitting: Nurse Practitioner

## 2015-07-26 ENCOUNTER — Encounter: Payer: Self-pay | Admitting: Nurse Practitioner

## 2015-07-26 ENCOUNTER — Other Ambulatory Visit (HOSPITAL_BASED_OUTPATIENT_CLINIC_OR_DEPARTMENT_OTHER): Payer: BLUE CROSS/BLUE SHIELD

## 2015-07-26 ENCOUNTER — Telehealth: Payer: Self-pay | Admitting: *Deleted

## 2015-07-26 VITALS — BP 129/68 | HR 61 | Temp 97.9°F | Resp 20 | Ht 71.0 in | Wt 208.3 lb

## 2015-07-26 DIAGNOSIS — C154 Malignant neoplasm of middle third of esophagus: Secondary | ICD-10-CM | POA: Diagnosis not present

## 2015-07-26 DIAGNOSIS — Z79899 Other long term (current) drug therapy: Secondary | ICD-10-CM | POA: Diagnosis not present

## 2015-07-26 DIAGNOSIS — C7801 Secondary malignant neoplasm of right lung: Secondary | ICD-10-CM

## 2015-07-26 DIAGNOSIS — C779 Secondary and unspecified malignant neoplasm of lymph node, unspecified: Secondary | ICD-10-CM

## 2015-07-26 DIAGNOSIS — M545 Low back pain: Secondary | ICD-10-CM

## 2015-07-26 DIAGNOSIS — Z5111 Encounter for antineoplastic chemotherapy: Secondary | ICD-10-CM

## 2015-07-26 DIAGNOSIS — C159 Malignant neoplasm of esophagus, unspecified: Secondary | ICD-10-CM

## 2015-07-26 LAB — CBC WITH DIFFERENTIAL/PLATELET
BASO%: 0.5 % (ref 0.0–2.0)
Basophils Absolute: 0 10*3/uL (ref 0.0–0.1)
EOS%: 0.4 % (ref 0.0–7.0)
Eosinophils Absolute: 0 10*3/uL (ref 0.0–0.5)
HCT: 42.5 % (ref 38.4–49.9)
HGB: 14.2 g/dL (ref 13.0–17.1)
LYMPH%: 27 % (ref 14.0–49.0)
MCH: 29.3 pg (ref 27.2–33.4)
MCHC: 33.4 g/dL (ref 32.0–36.0)
MCV: 87.7 fL (ref 79.3–98.0)
MONO#: 0.3 10*3/uL (ref 0.1–0.9)
MONO%: 8.3 % (ref 0.0–14.0)
NEUT%: 63.8 % (ref 39.0–75.0)
NEUTROS ABS: 2.5 10*3/uL (ref 1.5–6.5)
Platelets: 142 10*3/uL (ref 140–400)
RBC: 4.84 10*6/uL (ref 4.20–5.82)
RDW: 13.1 % (ref 11.0–14.6)
WBC: 4 10*3/uL (ref 4.0–10.3)
lymph#: 1.1 10*3/uL (ref 0.9–3.3)

## 2015-07-26 LAB — COMPREHENSIVE METABOLIC PANEL
ALT: 20 U/L (ref 0–55)
AST: 24 U/L (ref 5–34)
Albumin: 3.8 g/dL (ref 3.5–5.0)
Alkaline Phosphatase: 69 U/L (ref 40–150)
Anion Gap: 7 mEq/L (ref 3–11)
BILIRUBIN TOTAL: 1.02 mg/dL (ref 0.20–1.20)
BUN: 18.2 mg/dL (ref 7.0–26.0)
CHLORIDE: 106 meq/L (ref 98–109)
CO2: 26 meq/L (ref 22–29)
CREATININE: 1.1 mg/dL (ref 0.7–1.3)
Calcium: 9.5 mg/dL (ref 8.4–10.4)
EGFR: 69 mL/min/{1.73_m2} — ABNORMAL LOW (ref >90)
GLUCOSE: 105 mg/dL (ref 70–140)
Potassium: 4.1 mEq/L (ref 3.5–5.1)
SODIUM: 139 meq/L (ref 136–145)
TOTAL PROTEIN: 6.9 g/dL (ref 6.4–8.3)

## 2015-07-26 LAB — TSH: TSH: 2.628 m(IU)/L (ref 0.320–4.118)

## 2015-07-26 MED ORDER — SODIUM CHLORIDE 0.9 % IV SOLN
Freq: Once | INTRAVENOUS | Status: AC
  Administered 2015-07-26: 12:00:00 via INTRAVENOUS

## 2015-07-26 MED ORDER — SODIUM CHLORIDE 0.9 % IV SOLN
200.0000 mg | Freq: Once | INTRAVENOUS | Status: AC
  Administered 2015-07-26: 200 mg via INTRAVENOUS
  Filled 2015-07-26: qty 8

## 2015-07-26 NOTE — Telephone Encounter (Signed)
per pof to sch pt appt-sent MW emailt o sch trmt-pt to get updated copy b4 leaving

## 2015-07-26 NOTE — Patient Instructions (Signed)
[facility] Discharge Instructions for Patients Receiving Chemotherapy  Today you received the following chemotherapy agents Keytruda. To help prevent nausea and vomiting after your treatment, we encourage you to take your nausea medication as directed. If you develop nausea and vomiting that is not controlled by your nausea medication, call the clinic.   BELOW ARE SYMPTOMS THAT SHOULD BE REPORTED IMMEDIATELY:  *FEVER GREATER THAN 100.5 F  *CHILLS WITH OR WITHOUT FEVER  NAUSEA AND VOMITING THAT IS NOT CONTROLLED WITH YOUR NAUSEA MEDICATION  *UNUSUAL SHORTNESS OF BREATH  *UNUSUAL BRUISING OR BLEEDING  TENDERNESS IN MOUTH AND THROAT WITH OR WITHOUT PRESENCE OF ULCERS  *URINARY PROBLEMS  *BOWEL PROBLEMS  UNUSUAL RASH Items with * indicate a potential emergency and should be followed up as soon as possible.  Feel free to call the clinic you have any questions or concerns. The clinic phone number is [phone].  Please show the [facility] at check-in to the Emergency Department and triage nurse.      Pembrolizumab injection What is this medicine? PEMBROLIZUMAB (pem broe liz ue mab) is a monoclonal antibody. It is used to treat melanoma and non-small cell lung cancer. This medicine may be used for other purposes; ask your health care provider or pharmacist if you have questions. What should I tell my health care provider before I take this medicine? They need to know if you have any of these conditions: -diabetes -immune system problems -inflammatory bowel disease -liver disease -lung or breathing disease -lupus -an unusual or allergic reaction to pembrolizumab, other medicines, foods, dyes, or preservatives -pregnant or trying to get pregnant -breast-feeding How should I use this medicine? This medicine is for infusion into a vein. It is given by a health care professional in a hospital or clinic setting. A special MedGuide will be given to  you before each treatment. Be sure to read this information carefully each time. Talk to your pediatrician regarding the use of this medicine in children. Special care may be needed. Overdosage: If you think you have taken too much of this medicine contact a poison control center or emergency room at once. NOTE: This medicine is only for you. Do not share this medicine with others. What if I miss a dose? It is important not to miss your dose. Call your doctor or health care professional if you are unable to keep an appointment. What may interact with this medicine? Interactions have not been studied. Give your health care provider a list of all the medicines, herbs, non-prescription drugs, or dietary supplements you use. Also tell them if you smoke, drink alcohol, or use illegal drugs. Some items may interact with your medicine. This list may not describe all possible interactions. Give your health care provider a list of all the medicines, herbs, non-prescription drugs, or dietary supplements you use. Also tell them if you smoke, drink alcohol, or use illegal drugs. Some items may interact with your medicine. What should I watch for while using this medicine? Your condition will be monitored carefully while you are receiving this medicine. You may need blood work done while you are taking this medicine. Do not become pregnant while taking this medicine or for 4 months after stopping it. Women should inform their doctor if they wish to become pregnant or think they might be pregnant. There is a potential for serious side effects to an unborn child. Talk to your health care professional or pharmacist for more information. Do not breast-feed an infant  while taking this medicine or for 4 months after the last dose. What side effects may I notice from receiving this medicine? Side effects that you should report to your doctor or health care professional as soon as possible: -allergic reactions like skin  rash, itching or hives, swelling of the face, lips, or tongue -bloody or black, tarry stools -breathing problems -change in the amount of urine -changes in vision -chest pain -chills -dark urine -dizziness or feeling faint or lightheaded -fast or irregular heartbeat -fever -flushing -hair loss -muscle pain -muscle weakness -persistent headache -signs and symptoms of high blood sugar such as dizziness; dry mouth; dry skin; fruity breath; nausea; stomach pain; increased hunger or thirst; increased urination -signs and symptoms of liver injury like dark urine, light-colored stools, loss of appetite, nausea, right upper belly pain, yellowing of the eyes or skin -stomach pain -weight loss Side effects that usually do not require medical attention (Report these to your doctor or health care professional if they continue or are bothersome.):constipation -cough -diarrhea -joint pain -tiredness This list may not describe all possible side effects. Call your doctor for medical advice about side effects. You may report side effects to FDA at 1-800-FDA-1088. Where should I keep my medicine? This drug is given in a hospital or clinic and will not be stored at home. NOTE: This sheet is a summary. It may not cover all possible information. If you have questions about this medicine, talk to your doctor, pharmacist, or health care provider.    2016, Elsevier/Gold Standard. (2014-06-14 17:24:19)

## 2015-07-26 NOTE — Telephone Encounter (Signed)
Per staff message and POF I have scheduled appts. Advised scheduler of appts. JMW  

## 2015-07-26 NOTE — Progress Notes (Addendum)
  [place] OFFICE PROGRESS NOTE   Diagnosis:  Esophagus cancer  INTERVAL HISTORY:   [name] returns as scheduled. He feels well. Good appetite and good energy level. He remains very active. Stable chronic back pain. No nausea or vomiting. Bowels moving regularly.  Objective:  Vital signs in last 24 hours:  Blood pressure 129/68, pulse 61, temperature 97.9 F (36.6 C), temperature source Oral, resp. rate 20, height _0  (1.803 m), weight 208 lb 4.8 oz (94.484 kg), SpO2 100 %.    HEENT: No thrush or ulcers. Resp: Lungs clear bilaterally. Cardio: Regular rate and rhythm. GI: Abdomen soft and nontender. No hepatomegaly. Vascular: No leg edema.   Lab Results:  Lab Results  Component Value Date   WBC 4.0 07/26/2015   HGB 14.2 07/26/2015   HCT 42.5 07/26/2015   MCV 87.7 07/26/2015   PLT 142 07/26/2015   NEUTROABS 2.5 07/26/2015    Imaging:  No results found.  Medications: I have reviewed the patient's current medications.  Assessment/Plan: 1. 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 Radiation completed 06/10/2014  PET scan 07/01/2014 revealed improvement in the hypermetabolic esophagus mass and mediastinal lymphadenopathy  upper endoscopy 04/20/2015 - mid esophagus with mild narrowing , 2 small nodules measuring approximate 5 mm -biopsy with scant and detached fragments with high-grade squamous dysplasia , brushings negative for malignant cells.  CT chest/abdomen/pelvis 05/25/2015 with development of bilateral pulmonary nodules; paratracheal and subcarinal nodes improved/similar; new small lower paraesophageal nodes which are  indeterminate; esophageal wall thickening improved since the prior PET; no evidence of metastatic disease in the abdomen or pelvis.  PET scan 06/16/2015 revealed hypermetabolic lung nodules, hypermetabolic mediastinal nodes, hypermetabolism at the mid to distal esophagus has improved  bronchoscopy/EBUS on 07/04/2015 confirmed squamous cell carcinoma involving right lower lobe brushings, right bronchial washings, a level 10 R lymph node biopsy, and right lower lobe biopsy  PD-L1 no expression on the right lower lobe biopsy (less than 1%)  Cycle 1 Pembrolizumab 07/26/2015  2. Solid/liquid dysphagia secondary to #1  Jejunostomy feeding tube placement 04/07/2014  Feeding tube removed 05/30/2014.  3. Small bowel obstruction following placement of the jejunostomy feeding tube-resolved  4. History of ulcerative colitis  5. Aspiration pneumonia December 2015    Disposition: Mr. Luallen appears stable. The plan is to proceed with a trial of Pembrolizumab, cycle 1 today. We again reviewed potential toxicities. He will return for a follow-up visit and cycle 2 in 3 weeks. He will contact the office in the interim with any problems.  Patient seen with Dr. Benay Spice.    Ned Card ANP/GNP-BC   07/26/2015  10:23 AM  This was a shared visit with Ned Card. The PD L1 expression returned low on the lung biopsy. The significance of PD L1 expression in patients with upper GI malignancies is unclear. However I suspect there is a small chance of a clinical response with a PD1 inhibitor. I continue to feel it is reasonable to proceed with pembrolizumab given his lack of symptoms and low tumor burden. We again reviewed the potential toxicities associated with pembrolizumab and the low expected response rate. He agrees to proceed.  Julieanne Manson, M.D.

## 2015-07-27 LAB — T4: Thyroxine (T4): 6.3 ug/dL (ref 4.5–12.0)

## 2015-08-03 ENCOUNTER — Other Ambulatory Visit: Payer: Self-pay

## 2015-08-03 NOTE — Telephone Encounter (Signed)
Patient calling for a vicodin refill.  He states that he is taking 3 tabs daily because he is golfing more with the good weather and his back pain is worse.  Patient states he would rather have Dr. Benay Spice fill this prescription because every time he see's the physician that follows him for his scoliosis it costs him $40 just for the co pay.  He feels like he is seeing Dr. Benay Spice on a regular basis.  He is requesting that MD fills for #60 which should last him a month, 30 only lasts him 15 days.  Patient would like to pick this prescription up tomorrow morning.

## 2015-08-04 MED ORDER — HYDROCODONE-ACETAMINOPHEN 5-325 MG PO TABS: 1.0000 | ORAL_TABLET | Freq: Two times a day (BID) | ORAL | Status: DC | PRN

## 2015-08-04 NOTE — Addendum Note (Signed)
Addended by: Brien Few on: 08/04/2015 12:24 PM   Modules accepted: Orders

## 2015-08-04 NOTE — Addendum Note (Signed)
Addended by: Rosalio Macadamia C on: 08/04/2015 11:19 AM   Modules accepted: Orders

## 2015-08-13 ENCOUNTER — Other Ambulatory Visit: Payer: Self-pay | Admitting: Oncology

## 2015-08-16 ENCOUNTER — Other Ambulatory Visit (HOSPITAL_BASED_OUTPATIENT_CLINIC_OR_DEPARTMENT_OTHER): Payer: BLUE CROSS/BLUE SHIELD

## 2015-08-16 ENCOUNTER — Ambulatory Visit (HOSPITAL_BASED_OUTPATIENT_CLINIC_OR_DEPARTMENT_OTHER): Payer: BLUE CROSS/BLUE SHIELD | Admitting: Oncology

## 2015-08-16 ENCOUNTER — Telehealth: Payer: Self-pay | Admitting: Oncology

## 2015-08-16 ENCOUNTER — Encounter: Payer: Self-pay | Admitting: *Deleted

## 2015-08-16 ENCOUNTER — Ambulatory Visit (HOSPITAL_BASED_OUTPATIENT_CLINIC_OR_DEPARTMENT_OTHER): Payer: BLUE CROSS/BLUE SHIELD

## 2015-08-16 VITALS — BP 124/73 | HR 70 | Temp 97.8°F | Resp 20 | Ht 71.0 in | Wt 209.0 lb

## 2015-08-16 DIAGNOSIS — C779 Secondary and unspecified malignant neoplasm of lymph node, unspecified: Secondary | ICD-10-CM

## 2015-08-16 DIAGNOSIS — Z5112 Encounter for antineoplastic immunotherapy: Secondary | ICD-10-CM | POA: Diagnosis not present

## 2015-08-16 DIAGNOSIS — C184 Malignant neoplasm of transverse colon: Secondary | ICD-10-CM | POA: Diagnosis not present

## 2015-08-16 DIAGNOSIS — C154 Malignant neoplasm of middle third of esophagus: Secondary | ICD-10-CM

## 2015-08-16 DIAGNOSIS — C7801 Secondary malignant neoplasm of right lung: Secondary | ICD-10-CM

## 2015-08-16 LAB — CBC WITH DIFFERENTIAL/PLATELET
BASO%: 0.3 % (ref 0.0–2.0)
Basophils Absolute: 0 10*3/uL (ref 0.0–0.1)
EOS%: 0.3 % (ref 0.0–7.0)
Eosinophils Absolute: 0 10*3/uL (ref 0.0–0.5)
HEMATOCRIT: 42.9 % (ref 38.4–49.9)
HEMOGLOBIN: 14.2 g/dL (ref 13.0–17.1)
LYMPH#: 0.9 10*3/uL (ref 0.9–3.3)
LYMPH%: 12.8 % — ABNORMAL LOW (ref 14.0–49.0)
MCH: 29.5 pg (ref 27.2–33.4)
MCHC: 33 g/dL (ref 32.0–36.0)
MCV: 89.4 fL (ref 79.3–98.0)
MONO#: 0.5 10*3/uL (ref 0.1–0.9)
MONO%: 6.8 % (ref 0.0–14.0)
NEUT#: 5.5 10*3/uL (ref 1.5–6.5)
NEUT%: 79.8 % — ABNORMAL HIGH (ref 39.0–75.0)
Platelets: 147 10*3/uL (ref 140–400)
RBC: 4.79 10*6/uL (ref 4.20–5.82)
RDW: 12.9 % (ref 11.0–14.6)
WBC: 6.8 10*3/uL (ref 4.0–10.3)

## 2015-08-16 LAB — COMPREHENSIVE METABOLIC PANEL
ALBUMIN: 3.8 g/dL (ref 3.5–5.0)
ALK PHOS: 83 U/L (ref 40–150)
ALT: 17 U/L (ref 0–55)
AST: 24 U/L (ref 5–34)
Anion Gap: 10 mEq/L (ref 3–11)
BILIRUBIN TOTAL: 0.8 mg/dL (ref 0.20–1.20)
BUN: 17.4 mg/dL (ref 7.0–26.0)
CALCIUM: 9.7 mg/dL (ref 8.4–10.4)
CO2: 24 mEq/L (ref 22–29)
CREATININE: 1.2 mg/dL (ref 0.7–1.3)
Chloride: 107 mEq/L (ref 98–109)
EGFR: 63 mL/min/{1.73_m2} — ABNORMAL LOW (ref >90)
Glucose: 107 mg/dl (ref 70–140)
Potassium: 4.2 mEq/L (ref 3.5–5.1)
Sodium: 140 mEq/L (ref 136–145)
TOTAL PROTEIN: 7 g/dL (ref 6.4–8.3)

## 2015-08-16 MED ORDER — SODIUM CHLORIDE 0.9 % IV SOLN
200.0000 mg | Freq: Once | INTRAVENOUS | Status: AC
  Administered 2015-08-16: 200 mg via INTRAVENOUS
  Filled 2015-08-16: qty 8

## 2015-08-16 MED ORDER — SODIUM CHLORIDE 0.9 % IV SOLN
Freq: Once | INTRAVENOUS | Status: AC
  Administered 2015-08-16: 11:00:00 via INTRAVENOUS

## 2015-08-16 NOTE — Progress Notes (Signed)
  [place] OFFICE PROGRESS NOTE   Diagnosis: Esophagus cancer  INTERVAL HISTORY:   Billy Romero returns as scheduled. He completed a first treatment with pembrolizumab on 07/26/2015. He reports tolerating the treatment well. No rash or diarrhea. No dyspnea. He feels well. He is playing golf and jogging.  Objective:  Vital signs in last 24 hours:  Blood pressure 124/73, pulse 70, temperature 97.8 F (36.6 C), temperature source Oral, resp. rate 20, height _0  (1.803 m), weight 209 lb (94.802 kg), SpO2 99 %.    HEENT: No thrush or ulcers Resp: Lungs clear bilaterally Cardio: Regular rate and rhythm GI: No hepatomegaly, nontender Vascular: No leg edema  Skin: No rash   Portacath/PICC-without erythema  Lab Results:  Lab Results  Component Value Date   WBC 6.8 08/16/2015   HGB 14.2 08/16/2015   HCT 42.9 08/16/2015   MCV 89.4 08/16/2015   PLT 147 08/16/2015   NEUTROABS 5.5 08/16/2015    Medications: I have reviewed the patient's current medications.  Assessment/Plan: 1. 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 Radiation completed 06/10/2014  PET scan 07/01/2014 revealed improvement in the hypermetabolic esophagus mass and mediastinal lymphadenopathy  upper endoscopy 04/20/2015 - mid esophagus with mild narrowing , 2 small nodules measuring approximate 5 mm -biopsy with scant and detached fragments with high-grade squamous dysplasia , brushings negative for malignant cells.  CT chest/abdomen/pelvis 05/25/2015 with development of bilateral pulmonary nodules; paratracheal and subcarinal nodes improved/similar; new small lower  paraesophageal nodes which are indeterminate; esophageal wall thickening improved since the prior PET; no evidence of metastatic disease in the abdomen or pelvis.  PET scan 06/16/2015 revealed hypermetabolic lung nodules, hypermetabolic mediastinal nodes, hypermetabolism at the mid to distal esophagus has improved  bronchoscopy/EBUS on 07/04/2015 confirmed squamous cell carcinoma involving right lower lobe brushings, right bronchial washings, a level 10 R lymph node biopsy, and right lower lobe biopsy  PD-L1 no expression on the right lower lobe biopsy (less than 1%)  Cycle 1 Pembrolizumab 07/26/2015  Cycle 2 Pembrolizumab 08/16/2015   2. Solid/liquid dysphagia secondary to #1  Jejunostomy feeding tube placement 04/07/2014  Feeding tube removed 05/30/2014.  3. Small bowel obstruction following placement of the jejunostomy feeding tube-resolved  4. History of ulcerative colitis  5. Aspiration pneumonia December 2015   Disposition:  Billy Romero appears stable. He is tolerating the immunotherapy well to date. The plan is to proceed with cycle 2 today. He will return for an office visit and Pembrolizumab in 3 weeks.  Betsy Coder, MD  08/16/2015  9:47 AM

## 2015-08-16 NOTE — Telephone Encounter (Signed)
Gave and printed appt sched and avs for pt for may °

## 2015-08-16 NOTE — Progress Notes (Signed)
Oncology Nurse Navigator Documentation  Oncology Nurse Navigator Flowsheets 08/16/2015  Navigator Location [facility]  Navigator Encounter Type Treatment  Abnormal Finding Date 03/29/2014  Confirmed Diagnosis Date 03/29/2014  Treatment Initiated Date 04/27/2014  Patient Visit Type MedOnc  Treatment Phase Active Tx  Barriers/Navigation Needs No barriers at this time;No Questions;No Needs  Support Groups/Services GI Support Group;[place] [facility] Calendar for April  Acuity Level 1  Time Spent with Patient 15  Eating well, no pain. Goes jogging every day. No adverse effect from pembrolizumab.

## 2015-08-16 NOTE — Patient Instructions (Signed)
[facility] Discharge Instructions for Patients Receiving Chemotherapy  Today you received the following chemotherapy agents keytruda   To help prevent nausea and vomiting after your treatment, we encourage you to take your nausea medication as directed  If you develop nausea and vomiting that is not controlled by your nausea medication, call the clinic.   BELOW ARE SYMPTOMS THAT SHOULD BE REPORTED IMMEDIATELY:  *FEVER GREATER THAN 100.5 F  *CHILLS WITH OR WITHOUT FEVER  NAUSEA AND VOMITING THAT IS NOT CONTROLLED WITH YOUR NAUSEA MEDICATION  *UNUSUAL SHORTNESS OF BREATH  *UNUSUAL BRUISING OR BLEEDING  TENDERNESS IN MOUTH AND THROAT WITH OR WITHOUT PRESENCE OF ULCERS  *URINARY PROBLEMS  *BOWEL PROBLEMS  UNUSUAL RASH Items with * indicate a potential emergency and should be followed up as soon as possible.  Feel free to call the clinic you have any questions or concerns. The clinic phone number is [phone].  

## 2015-09-03 ENCOUNTER — Other Ambulatory Visit: Payer: Self-pay | Admitting: Oncology

## 2015-09-06 ENCOUNTER — Telehealth: Payer: Self-pay | Admitting: Oncology

## 2015-09-06 ENCOUNTER — Ambulatory Visit (HOSPITAL_BASED_OUTPATIENT_CLINIC_OR_DEPARTMENT_OTHER): Payer: BLUE CROSS/BLUE SHIELD | Admitting: Nurse Practitioner

## 2015-09-06 ENCOUNTER — Ambulatory Visit (HOSPITAL_BASED_OUTPATIENT_CLINIC_OR_DEPARTMENT_OTHER): Payer: BLUE CROSS/BLUE SHIELD

## 2015-09-06 ENCOUNTER — Other Ambulatory Visit (HOSPITAL_BASED_OUTPATIENT_CLINIC_OR_DEPARTMENT_OTHER): Payer: BLUE CROSS/BLUE SHIELD

## 2015-09-06 VITALS — BP 113/63 | HR 59 | Temp 98.4°F | Resp 17 | Ht 71.0 in | Wt 206.8 lb

## 2015-09-06 DIAGNOSIS — C779 Secondary and unspecified malignant neoplasm of lymph node, unspecified: Secondary | ICD-10-CM | POA: Diagnosis not present

## 2015-09-06 DIAGNOSIS — Z79899 Other long term (current) drug therapy: Secondary | ICD-10-CM

## 2015-09-06 DIAGNOSIS — Z5112 Encounter for antineoplastic immunotherapy: Secondary | ICD-10-CM

## 2015-09-06 DIAGNOSIS — C155 Malignant neoplasm of lower third of esophagus: Secondary | ICD-10-CM | POA: Diagnosis not present

## 2015-09-06 DIAGNOSIS — C154 Malignant neoplasm of middle third of esophagus: Secondary | ICD-10-CM

## 2015-09-06 DIAGNOSIS — G8929 Other chronic pain: Secondary | ICD-10-CM

## 2015-09-06 DIAGNOSIS — C7801 Secondary malignant neoplasm of right lung: Secondary | ICD-10-CM | POA: Diagnosis not present

## 2015-09-06 DIAGNOSIS — M545 Low back pain: Secondary | ICD-10-CM | POA: Diagnosis not present

## 2015-09-06 DIAGNOSIS — C159 Malignant neoplasm of esophagus, unspecified: Secondary | ICD-10-CM

## 2015-09-06 LAB — CBC WITH DIFFERENTIAL/PLATELET
BASO%: 0.6 % (ref 0.0–2.0)
Basophils Absolute: 0 10*3/uL (ref 0.0–0.1)
EOS%: 0.8 % (ref 0.0–7.0)
Eosinophils Absolute: 0 10*3/uL (ref 0.0–0.5)
HCT: 43.3 % (ref 38.4–49.9)
HGB: 14.3 g/dL (ref 13.0–17.1)
LYMPH#: 1 10*3/uL (ref 0.9–3.3)
LYMPH%: 28.5 % (ref 14.0–49.0)
MCH: 29.2 pg (ref 27.2–33.4)
MCHC: 33 g/dL (ref 32.0–36.0)
MCV: 88.6 fL (ref 79.3–98.0)
MONO#: 0.3 10*3/uL (ref 0.1–0.9)
MONO%: 9.4 % (ref 0.0–14.0)
NEUT#: 2.2 10*3/uL (ref 1.5–6.5)
NEUT%: 60.7 % (ref 39.0–75.0)
PLATELETS: 136 10*3/uL — AB (ref 140–400)
RBC: 4.89 10*6/uL (ref 4.20–5.82)
RDW: 12.8 % (ref 11.0–14.6)
WBC: 3.6 10*3/uL — ABNORMAL LOW (ref 4.0–10.3)

## 2015-09-06 LAB — COMPREHENSIVE METABOLIC PANEL
ALT: 17 U/L (ref 0–55)
ANION GAP: 7 meq/L (ref 3–11)
AST: 23 U/L (ref 5–34)
Albumin: 3.9 g/dL (ref 3.5–5.0)
Alkaline Phosphatase: 79 U/L (ref 40–150)
BUN: 14.8 mg/dL (ref 7.0–26.0)
CHLORIDE: 106 meq/L (ref 98–109)
CO2: 27 meq/L (ref 22–29)
CREATININE: 1.3 mg/dL (ref 0.7–1.3)
Calcium: 9.4 mg/dL (ref 8.4–10.4)
EGFR: 61 mL/min/{1.73_m2} — ABNORMAL LOW (ref >90)
Glucose: 94 mg/dl (ref 70–140)
POTASSIUM: 4 meq/L (ref 3.5–5.1)
Sodium: 140 mEq/L (ref 136–145)
Total Bilirubin: 0.63 mg/dL (ref 0.20–1.20)
Total Protein: 6.8 g/dL (ref 6.4–8.3)

## 2015-09-06 LAB — TSH: TSH: 2.937 m(IU)/L (ref 0.320–4.118)

## 2015-09-06 MED ORDER — SODIUM CHLORIDE 0.9 % IV SOLN
Freq: Once | INTRAVENOUS | Status: AC
  Administered 2015-09-06: 11:00:00 via INTRAVENOUS

## 2015-09-06 MED ORDER — SODIUM CHLORIDE 0.9 % IV SOLN
200.0000 mg | Freq: Once | INTRAVENOUS | Status: AC
  Administered 2015-09-06: 200 mg via INTRAVENOUS
  Filled 2015-09-06: qty 8

## 2015-09-06 NOTE — Telephone Encounter (Signed)
Gave pt apt & avs °

## 2015-09-06 NOTE — Patient Instructions (Signed)
[facility] Discharge Instructions for Patients Receiving Chemotherapy  Today you received the following chemotherapy agents: Keytruda   To help prevent nausea and vomiting after your treatment, we encourage you to take your nausea medication as directed    If you develop nausea and vomiting that is not controlled by your nausea medication, call the clinic.   BELOW ARE SYMPTOMS THAT SHOULD BE REPORTED IMMEDIATELY:  *FEVER GREATER THAN 100.5 F  *CHILLS WITH OR WITHOUT FEVER  NAUSEA AND VOMITING THAT IS NOT CONTROLLED WITH YOUR NAUSEA MEDICATION  *UNUSUAL SHORTNESS OF BREATH  *UNUSUAL BRUISING OR BLEEDING  TENDERNESS IN MOUTH AND THROAT WITH OR WITHOUT PRESENCE OF ULCERS  *URINARY PROBLEMS  *BOWEL PROBLEMS  UNUSUAL RASH Items with * indicate a potential emergency and should be followed up as soon as possible.  Feel free to call the clinic you have any questions or concerns. The clinic phone number is [phone].  Please show the CHEMO ALERT CARD at check-in to the Emergency Department and triage nurse.   

## 2015-09-06 NOTE — Progress Notes (Signed)
  [place] OFFICE PROGRESS NOTE   Diagnosis:  Esophagus cancer  INTERVAL HISTORY:   Billy Romero returns as scheduled. He completed cycle 2 Pembrolizumab 08/16/2015. He denies nausea/vomiting. No mouth sores. No diarrhea. No rash. No shortness of breath or cough. He occasionally notes wheezing when he "jogs". He has a good appetite. Stable chronic back pain. No new pain.  Objective:  Vital signs in last 24 hours:  Blood pressure 113/63, pulse 59, temperature 98.4 F (36.9 C), temperature source Oral, resp. rate 17, height '5\' 11"'$  (1.803 m), weight 206 lb 12.8 oz (93.804 kg), SpO2 100 %.    HEENT: No thrush or ulcers. Resp: Lungs clear bilaterally. No wheezes or rales. Cardio: Regular rate and rhythm. GI: Abdomen soft and nontender. No hepatomegaly. Vascular: No leg edema. Skin: No rash.    Lab Results:  Lab Results  Component Value Date   WBC 3.6* 09/06/2015   HGB 14.3 09/06/2015   HCT 43.3 09/06/2015   MCV 88.6 09/06/2015   PLT 136* 09/06/2015   NEUTROABS 2.2 09/06/2015    Imaging:  No results found.  Medications: I have reviewed the patient's current medications.  Assessment/Plan: 1. 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 Radiation completed 06/10/2014  PET scan 07/01/2014 revealed improvement in the hypermetabolic esophagus mass and mediastinal lymphadenopathy  upper endoscopy 04/20/2015 - mid esophagus with mild narrowing , 2 small nodules measuring approximate 5 mm -biopsy with scant and detached fragments with high-grade squamous dysplasia , brushings negative for malignant cells.  CT chest/abdomen/pelvis 05/25/2015  with development of bilateral pulmonary nodules; paratracheal and subcarinal nodes improved/similar; new small lower paraesophageal nodes which are indeterminate; esophageal wall thickening improved since the prior PET; no evidence of metastatic disease in the abdomen or pelvis.  PET scan 06/16/2015 revealed hypermetabolic lung nodules, hypermetabolic mediastinal nodes, hypermetabolism at the mid to distal esophagus has improved  bronchoscopy/EBUS on 07/04/2015 confirmed squamous cell carcinoma involving right lower lobe brushings, right bronchial washings, a level 10 R lymph node biopsy, and right lower lobe biopsy  PD-L1 no expression on the right lower lobe biopsy (less than 1%)  Cycle 1 Pembrolizumab 07/26/2015  Cycle 2 Pembrolizumab 08/16/2015  Cycle 3 Pembrolizumab 09/06/2015  2. Solid/liquid dysphagia secondary to #1  Jejunostomy feeding tube placement 04/07/2014  Feeding tube removed 05/30/2014.  3. Small bowel obstruction following placement of the jejunostomy feeding tube-resolved  4. History of ulcerative colitis  5. Aspiration pneumonia December 2015    Disposition: Billy Romero appears well. He has completed 2 cycles of pembrolizumab. Plan to proceed with cycle 3 today as scheduled. He will return for a follow-up visit and cycle 4 in 3 weeks. We will obtain a restaging CT scan of the chest after cycle 4.    Ned Card ANP/GNP-BC   09/06/2015  9:58 AM

## 2015-09-25 ENCOUNTER — Other Ambulatory Visit: Payer: Self-pay | Admitting: Oncology

## 2015-09-26 ENCOUNTER — Ambulatory Visit: Payer: 59 | Admitting: Oncology

## 2015-09-27 ENCOUNTER — Ambulatory Visit (HOSPITAL_BASED_OUTPATIENT_CLINIC_OR_DEPARTMENT_OTHER): Payer: BLUE CROSS/BLUE SHIELD | Admitting: Oncology

## 2015-09-27 ENCOUNTER — Ambulatory Visit (HOSPITAL_BASED_OUTPATIENT_CLINIC_OR_DEPARTMENT_OTHER): Payer: BLUE CROSS/BLUE SHIELD

## 2015-09-27 ENCOUNTER — Other Ambulatory Visit (HOSPITAL_BASED_OUTPATIENT_CLINIC_OR_DEPARTMENT_OTHER): Payer: BLUE CROSS/BLUE SHIELD

## 2015-09-27 ENCOUNTER — Other Ambulatory Visit: Payer: BLUE CROSS/BLUE SHIELD

## 2015-09-27 VITALS — BP 130/65 | HR 63 | Temp 98.1°F | Resp 18 | Ht 71.0 in | Wt 204.2 lb

## 2015-09-27 DIAGNOSIS — R42 Dizziness and giddiness: Secondary | ICD-10-CM | POA: Diagnosis not present

## 2015-09-27 DIAGNOSIS — C154 Malignant neoplasm of middle third of esophagus: Secondary | ICD-10-CM

## 2015-09-27 DIAGNOSIS — Z5112 Encounter for antineoplastic immunotherapy: Secondary | ICD-10-CM

## 2015-09-27 DIAGNOSIS — C155 Malignant neoplasm of lower third of esophagus: Secondary | ICD-10-CM

## 2015-09-27 DIAGNOSIS — Z79899 Other long term (current) drug therapy: Secondary | ICD-10-CM | POA: Diagnosis not present

## 2015-09-27 DIAGNOSIS — R062 Wheezing: Secondary | ICD-10-CM

## 2015-09-27 LAB — CBC WITH DIFFERENTIAL/PLATELET
BASO%: 0.7 % (ref 0.0–2.0)
BASOS ABS: 0 10*3/uL (ref 0.0–0.1)
EOS ABS: 0 10*3/uL (ref 0.0–0.5)
EOS%: 0.7 % (ref 0.0–7.0)
HEMATOCRIT: 39.8 % (ref 38.4–49.9)
HGB: 13.5 g/dL (ref 13.0–17.1)
LYMPH#: 1 10*3/uL (ref 0.9–3.3)
LYMPH%: 28.2 % (ref 14.0–49.0)
MCH: 29.7 pg (ref 27.2–33.4)
MCHC: 34 g/dL (ref 32.0–36.0)
MCV: 87.5 fL (ref 79.3–98.0)
MONO#: 0.3 10*3/uL (ref 0.1–0.9)
MONO%: 9.1 % (ref 0.0–14.0)
NEUT#: 2.2 10*3/uL (ref 1.5–6.5)
NEUT%: 61.3 % (ref 39.0–75.0)
PLATELETS: 152 10*3/uL (ref 140–400)
RBC: 4.54 10*6/uL (ref 4.20–5.82)
RDW: 12.9 % (ref 11.0–14.6)
WBC: 3.6 10*3/uL — ABNORMAL LOW (ref 4.0–10.3)

## 2015-09-27 LAB — COMPREHENSIVE METABOLIC PANEL
ALT: 14 U/L (ref 0–55)
ANION GAP: 7 meq/L (ref 3–11)
AST: 20 U/L (ref 5–34)
Albumin: 3.8 g/dL (ref 3.5–5.0)
Alkaline Phosphatase: 75 U/L (ref 40–150)
BUN: 13.6 mg/dL (ref 7.0–26.0)
CALCIUM: 9.2 mg/dL (ref 8.4–10.4)
CHLORIDE: 108 meq/L (ref 98–109)
CO2: 24 mEq/L (ref 22–29)
Creatinine: 1.2 mg/dL (ref 0.7–1.3)
EGFR: 66 mL/min/{1.73_m2} — AB (ref >90)
Glucose: 116 mg/dl (ref 70–140)
POTASSIUM: 4.1 meq/L (ref 3.5–5.1)
Sodium: 139 mEq/L (ref 136–145)
Total Bilirubin: 0.79 mg/dL (ref 0.20–1.20)
Total Protein: 6.7 g/dL (ref 6.4–8.3)

## 2015-09-27 LAB — TSH: TSH: 2.737 m[IU]/L (ref 0.320–4.118)

## 2015-09-27 MED ORDER — SODIUM CHLORIDE 0.9 % IV SOLN
200.0000 mg | Freq: Once | INTRAVENOUS | Status: AC
  Administered 2015-09-27: 200 mg via INTRAVENOUS
  Filled 2015-09-27: qty 8

## 2015-09-27 MED ORDER — SODIUM CHLORIDE 0.9 % IV SOLN
Freq: Once | INTRAVENOUS | Status: AC
  Administered 2015-09-27: 11:00:00 via INTRAVENOUS

## 2015-09-27 NOTE — Patient Instructions (Signed)
[facility] Discharge Instructions for Patients Receiving Chemotherapy  Today you received the following chemotherapy agents:  Keytruda.  To help prevent nausea and vomiting after your treatment, we encourage you to take your nausea medication as prescribed.   If you develop nausea and vomiting that is not controlled by your nausea medication, call the clinic.   BELOW ARE SYMPTOMS THAT SHOULD BE REPORTED IMMEDIATELY:  *FEVER GREATER THAN 100.5 F  *CHILLS WITH OR WITHOUT FEVER  NAUSEA AND VOMITING THAT IS NOT CONTROLLED WITH YOUR NAUSEA MEDICATION  *UNUSUAL SHORTNESS OF BREATH  *UNUSUAL BRUISING OR BLEEDING  TENDERNESS IN MOUTH AND THROAT WITH OR WITHOUT PRESENCE OF ULCERS  *URINARY PROBLEMS  *BOWEL PROBLEMS  UNUSUAL RASH Items with * indicate a potential emergency and should be followed up as soon as possible.  Feel free to call the clinic you have any questions or concerns. The clinic phone number is [phone].  Please show the CHEMO ALERT CARD at check-in to the Emergency Department and triage nurse.   

## 2015-09-27 NOTE — Progress Notes (Signed)
[place] OFFICE PROGRESS NOTE   Diagnosis: Esophagus cancer  INTERVAL HISTORY:   Billy Romero returns as scheduled. He completed another treatment with pembrolizumab on 09/06/2015. He reports tolerating the treatment well. No rash or diarrhea. Good appetite. No dysphagia. He is active playing golf several days per week. He exercises by running/walking 3.5 miles daily.  He reports several episodes of nausea followed by a faint/dizzy feeling. This is occurred multiple times over the past 3 weeks. This happened while he was jogging and once when he got out of the shower. No syncope. No dyspnea or chest pain. He wonders whether this may be related to dehydration. He he has noted several of these episodes occurred after he has played golf and exercised in the hot weather.    Objective:  Vital signs in last 24 hours:  Blood pressure 130/65, pulse 63, temperature 98.1 F (36.7 C), temperature source Oral, resp. rate 18, height 5' 11"  (1.803 m), weight 204 lb 3.2 oz (92.625 kg), SpO2 100 %.    HEENT: Neck without mass Lymphatics: No cervical or supraclavicular nodes Resp: Good air movement bilaterally, no respiratory distress, bilateral mild mid to end inspiratory wheeze Cardio: Regular rate and rhythm GI: No hepatomegaly, nontender Vascular: No leg edema  Skin: No rash     Lab Results:  Lab Results  Component Value Date   WBC 3.6* 09/27/2015   HGB 13.5 09/27/2015   HCT 39.8 09/27/2015   MCV 87.5 09/27/2015   PLT 152 09/27/2015   NEUTROABS 2.2 09/27/2015     Medications: I have reviewed the patient's current medications.  Assessment/Plan: 1. 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 Radiation completed 06/10/2014  PET scan 07/01/2014 revealed improvement in the hypermetabolic esophagus mass and mediastinal lymphadenopathy  upper endoscopy 04/20/2015 - mid esophagus with mild narrowing , 2 small nodules measuring approximate 5 mm -biopsy with scant and detached fragments with high-grade squamous dysplasia , brushings negative for malignant cells.  CT chest/abdomen/pelvis 05/25/2015 with development of bilateral pulmonary nodules; paratracheal and subcarinal nodes improved/similar; new small lower paraesophageal nodes which are indeterminate; esophageal wall thickening improved since the prior PET; no evidence of metastatic disease in the abdomen or pelvis.  PET scan 06/16/2015 revealed hypermetabolic lung nodules, hypermetabolic mediastinal nodes, hypermetabolism at the mid to distal esophagus has improved  bronchoscopy/EBUS on 07/04/2015 confirmed squamous cell carcinoma involving right lower lobe brushings, right bronchial washings, a level 10 R lymph node biopsy, and right lower lobe biopsy  PD-L1 no expression on the right lower lobe biopsy (less than 1%)  Cycle 1 Pembrolizumab 07/26/2015  Cycle 2 Pembrolizumab 08/16/2015  Cycle 3 Pembrolizumab 09/06/2015  Cycle 4 Pembrolizumab 09/27/2015  2. Solid/liquid dysphagia secondary to #1  Jejunostomy feeding tube placement 04/07/2014  Feeding tube removed 05/30/2014.  3. Small bowel obstruction following placement of the jejunostomy feeding tube-resolved  4. History of ulcerative colitis  5. Aspiration pneumonia December 2015    Disposition:  His overall status appears unchanged. The etiology of the "dizzy "episodes is unclear. I doubt this is related to toxicity from therapy. We will check a cortisol level today. He will try increasing his hydration.  The wheezing could be related to allergies or progressive tumor in the chest. I have  a low  suspicion for pneumonitis.  He is scheduled for a restaging CT evaluation in 2 weeks.  The plan is to proceed with another cycle of pembrolizumab today. He will return for an office visit in 3 weeks.  Betsy Coder, MD  09/27/2015  10:19 AM

## 2015-09-28 LAB — CORTISOL: Cortisol: 9.6 ug/dL

## 2015-10-02 ENCOUNTER — Encounter: Payer: Self-pay | Admitting: Oncology

## 2015-10-02 NOTE — Progress Notes (Signed)
keytruda-off label- recd application from patient. faxed [phone]

## 2015-10-05 ENCOUNTER — Encounter: Payer: Self-pay | Admitting: Oncology

## 2015-10-05 NOTE — Progress Notes (Signed)
placed [employer] access app for dr. Benay Spice to sign-faxed forms back [phone]

## 2015-10-11 ENCOUNTER — Ambulatory Visit (HOSPITAL_COMMUNITY)
Admission: RE | Admit: 2015-10-11 | Discharge: 2015-10-11 | Disposition: A | Discharge status: Home / Self Care | Payer: BLUE CROSS/BLUE SHIELD | Source: Ambulatory Visit | Attending: Nurse Practitioner | Admitting: Nurse Practitioner

## 2015-10-11 ENCOUNTER — Encounter (HOSPITAL_COMMUNITY): Payer: Self-pay

## 2015-10-11 DIAGNOSIS — R590 Localized enlarged lymph nodes: Secondary | ICD-10-CM | POA: Diagnosis not present

## 2015-10-11 DIAGNOSIS — I2510 Atherosclerotic heart disease of native coronary artery without angina pectoris: Secondary | ICD-10-CM | POA: Insufficient documentation

## 2015-10-11 DIAGNOSIS — C159 Malignant neoplasm of esophagus, unspecified: Secondary | ICD-10-CM

## 2015-10-11 DIAGNOSIS — I7090 Unspecified atherosclerosis: Secondary | ICD-10-CM | POA: Insufficient documentation

## 2015-10-11 DIAGNOSIS — R918 Other nonspecific abnormal finding of lung field: Secondary | ICD-10-CM | POA: Diagnosis not present

## 2015-10-11 MED ORDER — IOPAMIDOL (ISOVUE-300) INJECTION 61%
75.0000 mL | Freq: Once | INTRAVENOUS | Status: AC | PRN
  Administered 2015-10-11: 75 mL via INTRAVENOUS

## 2015-10-12 ENCOUNTER — Encounter: Payer: Self-pay | Admitting: Oncology

## 2015-10-12 NOTE — Progress Notes (Signed)
Per Maryjean Morn approved until 10/11/16 case [id number] sent to medical records

## 2015-10-12 NOTE — Progress Notes (Signed)
placed [employer] access app for dr. Benay Spice to sign-faxed forms back [phone]- approved until 10/11/16-sent to medical records

## 2015-10-15 ENCOUNTER — Other Ambulatory Visit: Payer: Self-pay | Admitting: Oncology

## 2015-10-18 ENCOUNTER — Other Ambulatory Visit: Payer: Self-pay | Admitting: *Deleted

## 2015-10-18 ENCOUNTER — Telehealth: Payer: Self-pay | Admitting: Oncology

## 2015-10-18 ENCOUNTER — Ambulatory Visit (HOSPITAL_BASED_OUTPATIENT_CLINIC_OR_DEPARTMENT_OTHER): Payer: BLUE CROSS/BLUE SHIELD | Admitting: Oncology

## 2015-10-18 ENCOUNTER — Ambulatory Visit (HOSPITAL_BASED_OUTPATIENT_CLINIC_OR_DEPARTMENT_OTHER): Payer: BLUE CROSS/BLUE SHIELD

## 2015-10-18 ENCOUNTER — Other Ambulatory Visit (HOSPITAL_BASED_OUTPATIENT_CLINIC_OR_DEPARTMENT_OTHER): Payer: BLUE CROSS/BLUE SHIELD

## 2015-10-18 VITALS — BP 128/69 | HR 60 | Temp 98.2°F | Resp 18 | Ht 71.0 in | Wt 206.3 lb

## 2015-10-18 DIAGNOSIS — Z79899 Other long term (current) drug therapy: Secondary | ICD-10-CM | POA: Diagnosis not present

## 2015-10-18 DIAGNOSIS — C7801 Secondary malignant neoplasm of right lung: Secondary | ICD-10-CM

## 2015-10-18 DIAGNOSIS — C159 Malignant neoplasm of esophagus, unspecified: Secondary | ICD-10-CM

## 2015-10-18 DIAGNOSIS — C799 Secondary malignant neoplasm of unspecified site: Secondary | ICD-10-CM | POA: Diagnosis not present

## 2015-10-18 DIAGNOSIS — C154 Malignant neoplasm of middle third of esophagus: Secondary | ICD-10-CM | POA: Diagnosis not present

## 2015-10-18 DIAGNOSIS — Z5112 Encounter for antineoplastic immunotherapy: Secondary | ICD-10-CM

## 2015-10-18 DIAGNOSIS — G8929 Other chronic pain: Secondary | ICD-10-CM

## 2015-10-18 DIAGNOSIS — C779 Secondary and unspecified malignant neoplasm of lymph node, unspecified: Secondary | ICD-10-CM

## 2015-10-18 DIAGNOSIS — M545 Low back pain: Secondary | ICD-10-CM

## 2015-10-18 LAB — CBC WITH DIFFERENTIAL/PLATELET
BASO%: 0.5 % (ref 0.0–2.0)
BASOS ABS: 0 10*3/uL (ref 0.0–0.1)
EOS%: 0.9 % (ref 0.0–7.0)
Eosinophils Absolute: 0 10*3/uL (ref 0.0–0.5)
HEMATOCRIT: 41.6 % (ref 38.4–49.9)
HGB: 13.8 g/dL (ref 13.0–17.1)
LYMPH%: 28.2 % (ref 14.0–49.0)
MCH: 29.3 pg (ref 27.2–33.4)
MCHC: 33.3 g/dL (ref 32.0–36.0)
MCV: 87.9 fL (ref 79.3–98.0)
MONO#: 0.3 10*3/uL (ref 0.1–0.9)
MONO%: 8.2 % (ref 0.0–14.0)
NEUT#: 2.2 10*3/uL (ref 1.5–6.5)
NEUT%: 62.2 % (ref 39.0–75.0)
Platelets: 138 10*3/uL — ABNORMAL LOW (ref 140–400)
RBC: 4.73 10*6/uL (ref 4.20–5.82)
RDW: 12.9 % (ref 11.0–14.6)
WBC: 3.6 10*3/uL — ABNORMAL LOW (ref 4.0–10.3)
lymph#: 1 10*3/uL (ref 0.9–3.3)

## 2015-10-18 LAB — COMPREHENSIVE METABOLIC PANEL
ALT: 18 U/L (ref 0–55)
ANION GAP: 8 meq/L (ref 3–11)
AST: 22 U/L (ref 5–34)
Albumin: 3.7 g/dL (ref 3.5–5.0)
Alkaline Phosphatase: 67 U/L (ref 40–150)
BUN: 17.2 mg/dL (ref 7.0–26.0)
CALCIUM: 9.2 mg/dL (ref 8.4–10.4)
CHLORIDE: 108 meq/L (ref 98–109)
CO2: 24 meq/L (ref 22–29)
CREATININE: 1.1 mg/dL (ref 0.7–1.3)
EGFR: 72 mL/min/{1.73_m2} — ABNORMAL LOW (ref >90)
Glucose: 118 mg/dl (ref 70–140)
POTASSIUM: 4.2 meq/L (ref 3.5–5.1)
Sodium: 140 mEq/L (ref 136–145)
Total Bilirubin: 0.6 mg/dL (ref 0.20–1.20)
Total Protein: 6.7 g/dL (ref 6.4–8.3)

## 2015-10-18 LAB — TSH: TSH: 4.309 m[IU]/L — AB (ref 0.320–4.118)

## 2015-10-18 MED ORDER — SODIUM CHLORIDE 0.9 % IV SOLN
200.0000 mg | Freq: Once | INTRAVENOUS | Status: AC
  Administered 2015-10-18: 200 mg via INTRAVENOUS
  Filled 2015-10-18: qty 8

## 2015-10-18 MED ORDER — SODIUM CHLORIDE 0.9 % IV SOLN
Freq: Once | INTRAVENOUS | Status: AC
  Administered 2015-10-18: 11:00:00 via INTRAVENOUS

## 2015-10-18 MED ORDER — HYDROCODONE-ACETAMINOPHEN 5-325 MG PO TABS: 1.0000 | ORAL_TABLET | Freq: Two times a day (BID) | ORAL | Status: DC | PRN

## 2015-10-18 NOTE — Patient Instructions (Signed)
[facility] Discharge Instructions for Patients Receiving Chemotherapy  Today you received the following chemotherapy agents:  Keytruda.  To help prevent nausea and vomiting after your treatment, we encourage you to take your nausea medication as prescribed.   If you develop nausea and vomiting that is not controlled by your nausea medication, call the clinic.   BELOW ARE SYMPTOMS THAT SHOULD BE REPORTED IMMEDIATELY:  *FEVER GREATER THAN 100.5 F  *CHILLS WITH OR WITHOUT FEVER  NAUSEA AND VOMITING THAT IS NOT CONTROLLED WITH YOUR NAUSEA MEDICATION  *UNUSUAL SHORTNESS OF BREATH  *UNUSUAL BRUISING OR BLEEDING  TENDERNESS IN MOUTH AND THROAT WITH OR WITHOUT PRESENCE OF ULCERS  *URINARY PROBLEMS  *BOWEL PROBLEMS  UNUSUAL RASH Items with * indicate a potential emergency and should be followed up as soon as possible.  Feel free to call the clinic you have any questions or concerns. The clinic phone number is [phone].  Please show the CHEMO ALERT CARD at check-in to the Emergency Department and triage nurse.   

## 2015-10-18 NOTE — Progress Notes (Signed)
  [place] OFFICE PROGRESS NOTE   Diagnosis: Esophagus cancer  INTERVAL HISTORY:   Billy Romero returns as scheduled. He completed another treatment with pembrolizumab on 09/27/2015.he reports feeling well. The wheezing has improved. He injured his back in a golf bunker last week. He has chronic back pain with similar exacerbations in the past.   Objective:  Vital signs in last 24 hours:  Blood pressure 128/69, pulse 60, temperature 98.2 F (36.8 C), temperature source Oral, resp. rate 18, height '5\' 11"'$  (1.803 m), weight 206 lb 4.8 oz (93.577 kg), SpO2 98 %.    HEENT: neck without mass Lymphatics: no cervical, supraclavicular, or axillary nodes Resp: lungs clear bilaterally Cardio: regular rate and rhythm GI: no hepatomegaly, no mass Vascular: leg edema   Lab Results:  Lab Results  Component Value Date   WBC 3.6* 10/18/2015   HGB 13.8 10/18/2015   HCT 41.6 10/18/2015   MCV 87.9 10/18/2015   PLT 138* 10/18/2015   NEUTROABS 2.2 10/18/2015     Medications: I have reviewed the patient's current medications.  Assessment/Plan: 1. 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 Radiation completed 06/10/2014  PET scan 07/01/2014 revealed improvement in the hypermetabolic esophagus mass and mediastinal lymphadenopathy  upper endoscopy 04/20/2015 - mid esophagus with mild narrowing , 2 small nodules measuring approximate 5 mm -biopsy with scant and detached fragments with high-grade squamous dysplasia , brushings negative for malignant cells.  CT chest/abdomen/pelvis 05/25/2015 with development of bilateral pulmonary nodules; paratracheal and  subcarinal nodes improved/similar; new small lower paraesophageal nodes which are indeterminate; esophageal wall thickening improved since the prior PET; no evidence of metastatic disease in the abdomen or pelvis.  PET scan 06/16/2015 revealed hypermetabolic lung nodules, hypermetabolic mediastinal nodes, hypermetabolism at the mid to distal esophagus has improved  bronchoscopy/EBUS on 07/04/2015 confirmed squamous cell carcinoma involving right lower lobe brushings, right bronchial washings, a level 10 R lymph node biopsy, and right lower lobe biopsy  PD-L1 no expression on the right lower lobe biopsy (less than 1%)  Cycle 1 Pembrolizumab 07/26/2015  Cycle 2 Pembrolizumab 08/16/2015  Cycle 3 Pembrolizumab 09/06/2015  Cycle 4 Pembrolizumab 09/27/2015  CT chest 10/11/2015-stable/decreased size of pulmonary nodules, enlargement of several mediastinal nodes  2. Solid/liquid dysphagia secondary to #1  Jejunostomy feeding tube placement 04/07/2014  Feeding tube removed 05/30/2014.  3. Small bowel obstruction following placement of the jejunostomy feeding tube-resolved  4. History of ulcerative colitis  5. Aspiration pneumonia December 2015    Disposition:  Billy Romero appears stable. He continues to tolerate the immunotherapy well. I reviewed the CT images with him. There is been partial improvement in the lung nodules and some of the mediastinal lymph nodes are larger-though they remain small. I think the overall pattern is stable disease. We decided to continue pembrolizumab. He will return for an office visit and treatment in 3 weeks. We refilled a prescription for hydrocodone to use as needed for back pain.  Betsy Coder, MD  10/18/2015  10:05 AM

## 2015-10-18 NOTE — Telephone Encounter (Signed)
per pof to sch pt appt-sch trmt-gave pt copy of avs

## 2015-10-19 ENCOUNTER — Encounter: Payer: Self-pay | Admitting: Oncology

## 2015-10-19 NOTE — Progress Notes (Signed)
Patient possibly recd letter from insurance of it being denied. See prev notes it has been approved, but since used off label- insurance denied.

## 2015-11-05 ENCOUNTER — Other Ambulatory Visit: Payer: Self-pay | Admitting: Oncology

## 2015-11-08 ENCOUNTER — Other Ambulatory Visit: Payer: Self-pay | Admitting: *Deleted

## 2015-11-08 ENCOUNTER — Ambulatory Visit (HOSPITAL_BASED_OUTPATIENT_CLINIC_OR_DEPARTMENT_OTHER): Payer: BLUE CROSS/BLUE SHIELD | Admitting: Oncology

## 2015-11-08 ENCOUNTER — Ambulatory Visit (HOSPITAL_BASED_OUTPATIENT_CLINIC_OR_DEPARTMENT_OTHER): Payer: BLUE CROSS/BLUE SHIELD

## 2015-11-08 ENCOUNTER — Other Ambulatory Visit (HOSPITAL_BASED_OUTPATIENT_CLINIC_OR_DEPARTMENT_OTHER): Payer: BLUE CROSS/BLUE SHIELD

## 2015-11-08 ENCOUNTER — Telehealth: Payer: Self-pay | Admitting: Oncology

## 2015-11-08 VITALS — BP 115/66 | HR 65 | Temp 98.1°F | Resp 18 | Ht 71.0 in | Wt 203.3 lb

## 2015-11-08 DIAGNOSIS — C155 Malignant neoplasm of lower third of esophagus: Secondary | ICD-10-CM

## 2015-11-08 DIAGNOSIS — C779 Secondary and unspecified malignant neoplasm of lymph node, unspecified: Secondary | ICD-10-CM

## 2015-11-08 DIAGNOSIS — Z79899 Other long term (current) drug therapy: Secondary | ICD-10-CM | POA: Diagnosis not present

## 2015-11-08 DIAGNOSIS — C154 Malignant neoplasm of middle third of esophagus: Secondary | ICD-10-CM

## 2015-11-08 DIAGNOSIS — R062 Wheezing: Secondary | ICD-10-CM

## 2015-11-08 DIAGNOSIS — M545 Low back pain: Secondary | ICD-10-CM

## 2015-11-08 DIAGNOSIS — Z5112 Encounter for antineoplastic immunotherapy: Secondary | ICD-10-CM | POA: Diagnosis not present

## 2015-11-08 DIAGNOSIS — C7801 Secondary malignant neoplasm of right lung: Secondary | ICD-10-CM | POA: Diagnosis not present

## 2015-11-08 LAB — COMPREHENSIVE METABOLIC PANEL
ALBUMIN: 4 g/dL (ref 3.5–5.0)
ALK PHOS: 82 U/L (ref 40–150)
ALT: 18 U/L (ref 0–55)
ANION GAP: 8 meq/L (ref 3–11)
AST: 21 U/L (ref 5–34)
BILIRUBIN TOTAL: 0.84 mg/dL (ref 0.20–1.20)
BUN: 19.1 mg/dL (ref 7.0–26.0)
CALCIUM: 9.7 mg/dL (ref 8.4–10.4)
CO2: 24 meq/L (ref 22–29)
CREATININE: 1.1 mg/dL (ref 0.7–1.3)
Chloride: 107 mEq/L (ref 98–109)
EGFR: 69 mL/min/{1.73_m2} — ABNORMAL LOW (ref >90)
Glucose: 108 mg/dl (ref 70–140)
Potassium: 4.2 mEq/L (ref 3.5–5.1)
Sodium: 140 mEq/L (ref 136–145)
TOTAL PROTEIN: 7.3 g/dL (ref 6.4–8.3)

## 2015-11-08 LAB — CBC WITH DIFFERENTIAL/PLATELET
BASO%: 0.5 % (ref 0.0–2.0)
BASOS ABS: 0 10*3/uL (ref 0.0–0.1)
EOS ABS: 0 10*3/uL (ref 0.0–0.5)
EOS%: 0.9 % (ref 0.0–7.0)
HEMATOCRIT: 44 % (ref 38.4–49.9)
HEMOGLOBIN: 14.9 g/dL (ref 13.0–17.1)
LYMPH#: 1.3 10*3/uL (ref 0.9–3.3)
LYMPH%: 25.1 % (ref 14.0–49.0)
MCH: 29.5 pg (ref 27.2–33.4)
MCHC: 33.7 g/dL (ref 32.0–36.0)
MCV: 87.4 fL (ref 79.3–98.0)
MONO#: 0.4 10*3/uL (ref 0.1–0.9)
MONO%: 8.2 % (ref 0.0–14.0)
NEUT%: 65.3 % (ref 39.0–75.0)
NEUTROS ABS: 3.3 10*3/uL (ref 1.5–6.5)
PLATELETS: 144 10*3/uL (ref 140–400)
RBC: 5.04 10*6/uL (ref 4.20–5.82)
RDW: 13.8 % (ref 11.0–14.6)
WBC: 5 10*3/uL (ref 4.0–10.3)

## 2015-11-08 LAB — TSH: TSH: 2.677 m(IU)/L (ref 0.320–4.118)

## 2015-11-08 MED ORDER — HYDROCODONE-ACETAMINOPHEN 5-325 MG PO TABS
1.0000 | ORAL_TABLET | Freq: Three times a day (TID) | ORAL | Status: DC | PRN
Start: 2015-11-08 — End: 2015-12-20

## 2015-11-08 MED ORDER — SODIUM CHLORIDE 0.9 % IV SOLN
200.0000 mg | Freq: Once | INTRAVENOUS | Status: AC
  Administered 2015-11-08: 200 mg via INTRAVENOUS
  Filled 2015-11-08: qty 8

## 2015-11-08 MED ORDER — SODIUM CHLORIDE 0.9 % IV SOLN
Freq: Once | INTRAVENOUS | Status: AC
  Administered 2015-11-08: 13:00:00 via INTRAVENOUS

## 2015-11-08 NOTE — Progress Notes (Signed)
  [place] OFFICE PROGRESS NOTE   Diagnosis: Esophagus cancer  INTERVAL HISTORY:   Billy Romero returns as scheduled. He completed another treatment with pembrolizumab on 10/18/2015. He tolerated treatment well. The back pain has improved. He is playing golf. He has intermittent wheezing. He reports mild intermittent dyspnea. No other complaint. Good appetite.  Objective:  Vital signs in last 24 hours:  Blood pressure 115/66, pulse 65, temperature 98.1 F (36.7 C), temperature source Oral, resp. rate 18, height _0  (1.803 m), weight 203 lb 4.8 oz (92.216 kg), SpO2 100 %.    HEENT: No thrush or ulcers Resp: Wheeze at the right upper anterior chest, good air movement bilaterally, no respiratory distress Cardio: Regular rate and rhythm GI: No hepatomegaly, nontender Vascular: No leg edema   Lab Results:  Lab Results  Component Value Date   WBC 5.0 11/08/2015   HGB 14.9 11/08/2015   HCT 44.0 11/08/2015   MCV 87.4 11/08/2015   PLT 144 11/08/2015   NEUTROABS 3.3 11/08/2015   TSH-2.67  Medications: I have reviewed the patient's current medications.  Assessment/Plan: 1. 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 Radiation completed 06/10/2014  PET scan 07/01/2014 revealed improvement in the hypermetabolic esophagus mass and mediastinal lymphadenopathy  upper endoscopy 04/20/2015 - mid esophagus with mild narrowing , 2 small nodules measuring approximate 5 mm -biopsy with scant and detached fragments with high-grade squamous dysplasia , brushings negative for malignant cells.  CT chest/abdomen/pelvis 05/25/2015 with development of  bilateral pulmonary nodules; paratracheal and subcarinal nodes improved/similar; new small lower paraesophageal nodes which are indeterminate; esophageal wall thickening improved since the prior PET; no evidence of metastatic disease in the abdomen or pelvis.  PET scan 06/16/2015 revealed hypermetabolic lung nodules, hypermetabolic mediastinal nodes, hypermetabolism at the mid to distal esophagus has improved  bronchoscopy/EBUS on 07/04/2015 confirmed squamous cell carcinoma involving right lower lobe brushings, right bronchial washings, a level 10 R lymph node biopsy, and right lower lobe biopsy  PD-L1 no expression on the right lower lobe biopsy (less than 1%)  Cycle 1 Pembrolizumab 07/26/2015  Cycle 2 Pembrolizumab 08/16/2015  Cycle 3 Pembrolizumab 09/06/2015  Cycle 4 Pembrolizumab 09/27/2015  CT chest 10/11/2015-stable/decreased size of pulmonary nodules, enlargement of several mediastinal nodes  Cycle 5 Pembrolizumab 10/18/2015  Cycle 6 Pembrolizumab 11/08/2015   2. Solid/liquid dysphagia secondary to #1  Jejunostomy feeding tube placement 04/07/2014  Feeding tube removed 05/30/2014.  3. Small bowel obstruction following placement of the jejunostomy feeding tube-resolved  4. History of ulcerative colitis  5. Aspiration pneumonia December 2015     Disposition:  Mr. Bais appears stable. I doubt the mild wheezing is related to esophagus cancer or treatment. He believes his symptoms are secondary to sinus drainage. The plan is to continue Pembrolizumab every 3 weeks.    Betsy Coder, MD  11/08/2015  11:50 AM

## 2015-11-08 NOTE — Patient Instructions (Signed)
[facility] Discharge Instructions for Patients Receiving Chemotherapy  Today you received the following chemotherapy agents: Keytruda   To help prevent nausea and vomiting after your treatment, we encourage you to take your nausea medication as directed    If you develop nausea and vomiting that is not controlled by your nausea medication, call the clinic.   BELOW ARE SYMPTOMS THAT SHOULD BE REPORTED IMMEDIATELY:  *FEVER GREATER THAN 100.5 F  *CHILLS WITH OR WITHOUT FEVER  NAUSEA AND VOMITING THAT IS NOT CONTROLLED WITH YOUR NAUSEA MEDICATION  *UNUSUAL SHORTNESS OF BREATH  *UNUSUAL BRUISING OR BLEEDING  TENDERNESS IN MOUTH AND THROAT WITH OR WITHOUT PRESENCE OF ULCERS  *URINARY PROBLEMS  *BOWEL PROBLEMS  UNUSUAL RASH Items with * indicate a potential emergency and should be followed up as soon as possible.  Feel free to call the clinic you have any questions or concerns. The clinic phone number is [phone].  Please show the CHEMO ALERT CARD at check-in to the Emergency Department and triage nurse.   

## 2015-11-08 NOTE — Telephone Encounter (Signed)
GAVE PATIENT AVS REPORT AND APPOINTMENTS FOR AUGUST. ORDER FOR LABS ON 8/23 ONLY.

## 2015-11-09 LAB — T4: T4 TOTAL: 7.1 ug/dL (ref 4.5–12.0)

## 2015-11-26 ENCOUNTER — Other Ambulatory Visit: Payer: Self-pay | Admitting: Oncology

## 2015-11-29 ENCOUNTER — Ambulatory Visit (HOSPITAL_BASED_OUTPATIENT_CLINIC_OR_DEPARTMENT_OTHER): Payer: BLUE CROSS/BLUE SHIELD

## 2015-11-29 ENCOUNTER — Ambulatory Visit (HOSPITAL_BASED_OUTPATIENT_CLINIC_OR_DEPARTMENT_OTHER): Payer: BLUE CROSS/BLUE SHIELD | Admitting: Oncology

## 2015-11-29 ENCOUNTER — Telehealth: Payer: Self-pay | Admitting: *Deleted

## 2015-11-29 VITALS — BP 116/71 | HR 63 | Temp 98.4°F | Resp 18 | Ht 71.0 in | Wt 204.7 lb

## 2015-11-29 DIAGNOSIS — Z5112 Encounter for antineoplastic immunotherapy: Secondary | ICD-10-CM | POA: Diagnosis not present

## 2015-11-29 DIAGNOSIS — R062 Wheezing: Secondary | ICD-10-CM

## 2015-11-29 DIAGNOSIS — C154 Malignant neoplasm of middle third of esophagus: Secondary | ICD-10-CM

## 2015-11-29 DIAGNOSIS — C7801 Secondary malignant neoplasm of right lung: Secondary | ICD-10-CM

## 2015-11-29 DIAGNOSIS — G8929 Other chronic pain: Secondary | ICD-10-CM

## 2015-11-29 DIAGNOSIS — M545 Low back pain: Secondary | ICD-10-CM

## 2015-11-29 DIAGNOSIS — C779 Secondary and unspecified malignant neoplasm of lymph node, unspecified: Secondary | ICD-10-CM

## 2015-11-29 MED ORDER — SODIUM CHLORIDE 0.9 % IV SOLN
Freq: Once | INTRAVENOUS | Status: AC
  Administered 2015-11-29: 12:00:00 via INTRAVENOUS

## 2015-11-29 MED ORDER — SODIUM CHLORIDE 0.9 % IV SOLN
200.0000 mg | Freq: Once | INTRAVENOUS | Status: AC
  Administered 2015-11-29: 200 mg via INTRAVENOUS
  Filled 2015-11-29: qty 8

## 2015-11-29 NOTE — Progress Notes (Signed)
Okay to treat today with labs from 11-08-15 per Dr. Benay Spice.

## 2015-11-29 NOTE — Progress Notes (Signed)
  [place] OFFICE PROGRESS NOTE   Diagnosis: Esophagus cancer  INTERVAL HISTORY:   Mr. Nudd returns as scheduled. He completed another treatment with pembrolizumab on 11/08/2015. The only side effect he attributes to treatment is arthralgias. He has chronic back and wrist pain. He takes anti-inflammatories and hydrocodone as needed when he plays golf. Good appetite. No dysphagia. He continues to have wheezing in the mornings. No dyspnea. He is exercising. He reports the nurses have difficulty obtaining IV access. Objective:  Vital signs in last 24 hours:  Blood pressure 116/71, pulse 63, temperature 98.4 F (36.9 C), temperature source Oral, resp. rate 18, height '5\' 11"'$  (1.803 m), weight 204 lb 11.2 oz (92.9 kg), SpO2 97 %.    Resp: Scattered mild end inspiratory and expiratory wheezes, no respiratory distress Cardio: Regular rate and rhythm GI: No hepatosplenomegaly, nontender Vascular: No leg edema  Skin: No rash     Lab Results:  Lab Results  Component Value Date   WBC 5.0 11/08/2015   HGB 14.9 11/08/2015   HCT 44.0 11/08/2015   MCV 87.4 11/08/2015   PLT 144 11/08/2015   NEUTROABS 3.3 11/08/2015     Medications: I have reviewed the patient's current medications.  Assessment/Plan: 1. 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 Radiation completed 06/10/2014  PET scan 07/01/2014 revealed improvement in the hypermetabolic esophagus mass and mediastinal lymphadenopathy  upper endoscopy 04/20/2015 - mid esophagus with mild narrowing , 2 small nodules measuring approximate 5 mm -biopsy with scant and detached fragments with  high-grade squamous dysplasia , brushings negative for malignant cells.  CT chest/abdomen/pelvis 05/25/2015 with development of bilateral pulmonary nodules; paratracheal and subcarinal nodes improved/similar; new small lower paraesophageal nodes which are indeterminate; esophageal wall thickening improved since the prior PET; no evidence of metastatic disease in the abdomen or pelvis.  PET scan 06/16/2015 revealed hypermetabolic lung nodules, hypermetabolic mediastinal nodes, hypermetabolism at the mid to distal esophagus has improved  bronchoscopy/EBUS on 07/04/2015 confirmed squamous cell carcinoma involving right lower lobe brushings, right bronchial washings, a level 10 R lymph node biopsy, and right lower lobe biopsy  PD-L1 no expression on the right lower lobe biopsy (less than 1%)  Cycle 1 Pembrolizumab 07/26/2015  Cycle 2 Pembrolizumab 08/16/2015  Cycle 3 Pembrolizumab 09/06/2015  Cycle 4 Pembrolizumab 09/27/2015  CT chest 10/11/2015-stable/decreased size of pulmonary nodules, enlargement of several mediastinal nodes  Cycle 5 Pembrolizumab 10/18/2015  Cycle 6 Pembrolizumab 11/08/2015   Cycle 7 Pembrolizumab 11/29/2015   2. Solid/liquid dysphagia secondary to #1  Jejunostomy feeding tube placement 04/07/2014  Feeding tube removed 05/30/2014.  3. Small bowel obstruction following placement of the jejunostomy feeding tube-resolved  4. History of ulcerative colitis  5. Aspiration pneumonia December 2015     Disposition: Mr. Beauchaine appears stable. He will complete another treatment today. He will return for an office visit and pembrolizumab in 3 weeks. He will be scheduled for a restaging CT evaluation after the next cycle of immunotherapy.    Betsy Coder, MD  11/29/2015  11:18 AM

## 2015-11-29 NOTE — Patient Instructions (Signed)
[facility] Discharge Instructions for Patients Receiving Chemotherapy  Today you received the following chemotherapy agents: Keytruda   To help prevent nausea and vomiting after your treatment, we encourage you to take your nausea medication as directed    If you develop nausea and vomiting that is not controlled by your nausea medication, call the clinic.   BELOW ARE SYMPTOMS THAT SHOULD BE REPORTED IMMEDIATELY:  *FEVER GREATER THAN 100.5 F  *CHILLS WITH OR WITHOUT FEVER  NAUSEA AND VOMITING THAT IS NOT CONTROLLED WITH YOUR NAUSEA MEDICATION  *UNUSUAL SHORTNESS OF BREATH  *UNUSUAL BRUISING OR BLEEDING  TENDERNESS IN MOUTH AND THROAT WITH OR WITHOUT PRESENCE OF ULCERS  *URINARY PROBLEMS  *BOWEL PROBLEMS  UNUSUAL RASH Items with * indicate a potential emergency and should be followed up as soon as possible.  Feel free to call the clinic you have any questions or concerns. The clinic phone number is [phone].  Please show the CHEMO ALERT CARD at check-in to the Emergency Department and triage nurse.   

## 2015-11-29 NOTE — Telephone Encounter (Signed)
Received message that pt was requesting a letter clearing him for dental cleaning. Called pt, he is due for routine visit. Informed him it is OK to proceed with dental cleaning, per Dr. Benay Spice. Have dentist call office with any questions. He voiced understanding.

## 2015-12-17 ENCOUNTER — Other Ambulatory Visit: Payer: Self-pay | Admitting: Oncology

## 2015-12-20 ENCOUNTER — Telehealth: Payer: Self-pay | Admitting: Oncology

## 2015-12-20 ENCOUNTER — Other Ambulatory Visit (HOSPITAL_BASED_OUTPATIENT_CLINIC_OR_DEPARTMENT_OTHER): Payer: BLUE CROSS/BLUE SHIELD

## 2015-12-20 ENCOUNTER — Ambulatory Visit (HOSPITAL_BASED_OUTPATIENT_CLINIC_OR_DEPARTMENT_OTHER): Payer: BLUE CROSS/BLUE SHIELD | Admitting: Oncology

## 2015-12-20 ENCOUNTER — Ambulatory Visit (HOSPITAL_BASED_OUTPATIENT_CLINIC_OR_DEPARTMENT_OTHER): Payer: BLUE CROSS/BLUE SHIELD

## 2015-12-20 ENCOUNTER — Encounter: Payer: Self-pay | Admitting: *Deleted

## 2015-12-20 VITALS — BP 117/73 | HR 66 | Temp 98.0°F | Resp 18 | Ht 71.0 in | Wt 204.3 lb

## 2015-12-20 DIAGNOSIS — C779 Secondary and unspecified malignant neoplasm of lymph node, unspecified: Secondary | ICD-10-CM | POA: Diagnosis not present

## 2015-12-20 DIAGNOSIS — Z5112 Encounter for antineoplastic immunotherapy: Secondary | ICD-10-CM

## 2015-12-20 DIAGNOSIS — Z79899 Other long term (current) drug therapy: Secondary | ICD-10-CM

## 2015-12-20 DIAGNOSIS — C184 Malignant neoplasm of transverse colon: Secondary | ICD-10-CM

## 2015-12-20 DIAGNOSIS — C154 Malignant neoplasm of middle third of esophagus: Secondary | ICD-10-CM

## 2015-12-20 DIAGNOSIS — C7801 Secondary malignant neoplasm of right lung: Secondary | ICD-10-CM

## 2015-12-20 DIAGNOSIS — C155 Malignant neoplasm of lower third of esophagus: Secondary | ICD-10-CM

## 2015-12-20 LAB — CBC WITH DIFFERENTIAL/PLATELET
BASO%: 0.3 % (ref 0.0–2.0)
Basophils Absolute: 0 10*3/uL (ref 0.0–0.1)
EOS ABS: 0.1 10*3/uL (ref 0.0–0.5)
EOS%: 1 % (ref 0.0–7.0)
HCT: 39.6 % (ref 38.4–49.9)
HEMOGLOBIN: 13.8 g/dL (ref 13.0–17.1)
LYMPH%: 21 % (ref 14.0–49.0)
MCH: 29.9 pg (ref 27.2–33.4)
MCHC: 34.8 g/dL (ref 32.0–36.0)
MCV: 85.9 fL (ref 79.3–98.0)
MONO#: 0.4 10*3/uL (ref 0.1–0.9)
MONO%: 6.7 % (ref 0.0–14.0)
NEUT%: 71 % (ref 39.0–75.0)
NEUTROS ABS: 4.3 10*3/uL (ref 1.5–6.5)
PLATELETS: 160 10*3/uL (ref 140–400)
RBC: 4.61 10*6/uL (ref 4.20–5.82)
RDW: 13.3 % (ref 11.0–14.6)
WBC: 6.1 10*3/uL (ref 4.0–10.3)
lymph#: 1.3 10*3/uL (ref 0.9–3.3)

## 2015-12-20 LAB — COMPREHENSIVE METABOLIC PANEL
ALBUMIN: 3.8 g/dL (ref 3.5–5.0)
ALK PHOS: 77 U/L (ref 40–150)
ALT: 14 U/L (ref 0–55)
AST: 20 U/L (ref 5–34)
Anion Gap: 9 mEq/L (ref 3–11)
BILIRUBIN TOTAL: 0.91 mg/dL (ref 0.20–1.20)
BUN: 22.1 mg/dL (ref 7.0–26.0)
CO2: 22 meq/L (ref 22–29)
CREATININE: 1 mg/dL (ref 0.7–1.3)
Calcium: 9.5 mg/dL (ref 8.4–10.4)
Chloride: 108 mEq/L (ref 98–109)
EGFR: 80 mL/min/{1.73_m2} — AB (ref >90)
GLUCOSE: 111 mg/dL (ref 70–140)
Potassium: 4.2 mEq/L (ref 3.5–5.1)
SODIUM: 139 meq/L (ref 136–145)
TOTAL PROTEIN: 7 g/dL (ref 6.4–8.3)

## 2015-12-20 LAB — TSH: TSH: 3.957 m(IU)/L (ref 0.320–4.118)

## 2015-12-20 MED ORDER — HYDROCODONE-ACETAMINOPHEN 5-325 MG PO TABS: 1.0000 | ORAL_TABLET | Freq: Three times a day (TID) | ORAL | 0 refills | Status: DC | PRN

## 2015-12-20 MED ORDER — SODIUM CHLORIDE 0.9 % IV SOLN
200.0000 mg | Freq: Once | INTRAVENOUS | Status: AC
  Administered 2015-12-20: 200 mg via INTRAVENOUS
  Filled 2015-12-20: qty 8

## 2015-12-20 MED ORDER — SODIUM CHLORIDE 0.9 % IV SOLN
Freq: Once | INTRAVENOUS | Status: AC
  Administered 2015-12-20: 12:00:00 via INTRAVENOUS

## 2015-12-20 NOTE — Telephone Encounter (Signed)
GAVE PATIENT AVS REPORT AND APPOINTMENTS FOR September. [facility] WILL CALL RE SCAN - PATIENT AWARE.

## 2015-12-20 NOTE — Progress Notes (Signed)
Oncology Nurse Navigator Documentation  Oncology Nurse Navigator Flowsheets 12/20/2015  Navigator Location [facility]  Navigator Encounter Type Treatment  Abnormal Finding Date -  Confirmed Diagnosis Date -  Treatment Initiated Date -  Patient Visit Type MedOnc  Treatment Phase Active Tx--Pembrolizumab   Barriers/Navigation Needs No barriers at this time;No Questions;No Needs  Interventions None required  Support Groups/Services -Very active, plays golf with senior league 3/week; good pension from work and got 1st social security check. Feels he is doing well.  [employer] -  Time Spent with Patient 15

## 2015-12-20 NOTE — Progress Notes (Signed)
  [place] OFFICE PROGRESS NOTE   Diagnosis: Esophagus cancer  INTERVAL HISTORY:   Billy Romero returns as scheduled. He was last treated with pembrolizumab on 11/29/2015. No diarrhea or rash. Good appetite. No dysphagia. Good energy level. He is playing golf and exercising. He reports the cough and wheezing have improved. Mild exertional dyspnea.  Objective:  Vital signs in last 24 hours:  Blood pressure 117/73, pulse 66, temperature 98 F (36.7 C), temperature source Oral, resp. rate 18, height '5\' 11"'$  (1.803 m), weight 204 lb 4.8 oz (92.7 kg), SpO2 98 %.    HEENT: No thrush or ulcers Lymphatics: No cervical or supra-clavicular nodes Resp: Scattered inspiratory and expiratory rhonchi/wheeze, no respiratory distress Cardio: Regular rate and rhythm GI: No hepatomegaly, nontender Vascular: No leg edema  Lab Results:  Lab Results  Component Value Date   WBC 6.1 12/20/2015   HGB 13.8 12/20/2015   HCT 39.6 12/20/2015   MCV 85.9 12/20/2015   PLT 160 12/20/2015   NEUTROABS 4.3 12/20/2015     Imaging:  No results found.  Medications: I have reviewed the patient's current medications.  Assessment/Plan: 1. 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 Radiation completed 06/10/2014  PET scan 07/01/2014 revealed improvement in the hypermetabolic esophagus mass and mediastinal lymphadenopathy  upper endoscopy 04/20/2015 -mid esophagus with mild narrowing , 2 small nodules measuring approximate 5 mm -biopsy with scant and detached fragments with high-grade squamous dysplasia , brushings negative for malignant cells.  CT chest/abdomen/pelvis  05/25/2015 with development of bilateral pulmonary nodules; paratracheal and subcarinal nodes improved/similar; new small lower paraesophageal nodes which are indeterminate; esophageal wall thickening improved since the prior PET; no evidence of metastatic disease in the abdomen or pelvis.  PET scan 06/16/2015 revealed hypermetabolic lung nodules, hypermetabolic mediastinal nodes, hypermetabolism at the mid to distal esophagus has improved  bronchoscopy/EBUS on 07/04/2015 confirmed squamous cell carcinoma involving right lower lobe brushings, right bronchial washings, a level 10 R lymph node biopsy, and right lower lobe biopsy  PD-L1 no expression on the right lower lobe biopsy (less than 1%)  Cycle 1 Pembrolizumab 07/26/2015  Cycle 2 Pembrolizumab 08/16/2015  Cycle 3 Pembrolizumab 09/06/2015  Cycle 4 Pembrolizumab 09/27/2015  CT chest 10/11/2015-stable/decreased size of pulmonary nodules, enlargement of several mediastinal nodes  Cycle 5 Pembrolizumab 10/18/2015  Cycle 6 Pembrolizumab 11/08/2015   Cycle 7 Pembrolizumab 11/29/2015   Cycle 8 Pembrolizumab 12/20/2015  2. Solid/liquid dysphagia secondary to #1  Jejunostomy feeding tube placement 04/07/2014  Feeding tube removed 05/30/2014.  3. Small bowel obstruction following placement of the jejunostomy feeding tube-resolved  4. History of ulcerative colitis  5. Aspiration pneumonia December 2015     Disposition:  [name] appears stable. He will complete another treatment with pembrolizumab today. He will return for an office visit and restaging CT in one month.  Billy Coder, MD  12/20/2015  10:25 AM

## 2015-12-20 NOTE — Patient Instructions (Signed)
[facility] Discharge Instructions for Patients Receiving Chemotherapy  Today you received the following chemotherapy agents: Keytruda   To help prevent nausea and vomiting after your treatment, we encourage you to take your nausea medication as directed    If you develop nausea and vomiting that is not controlled by your nausea medication, call the clinic.   BELOW ARE SYMPTOMS THAT SHOULD BE REPORTED IMMEDIATELY:  *FEVER GREATER THAN 100.5 F  *CHILLS WITH OR WITHOUT FEVER  NAUSEA AND VOMITING THAT IS NOT CONTROLLED WITH YOUR NAUSEA MEDICATION  *UNUSUAL SHORTNESS OF BREATH  *UNUSUAL BRUISING OR BLEEDING  TENDERNESS IN MOUTH AND THROAT WITH OR WITHOUT PRESENCE OF ULCERS  *URINARY PROBLEMS  *BOWEL PROBLEMS  UNUSUAL RASH Items with * indicate a potential emergency and should be followed up as soon as possible.  Feel free to call the clinic you have any questions or concerns. The clinic phone number is [phone].  Please show the CHEMO ALERT CARD at check-in to the Emergency Department and triage nurse.   

## 2016-01-15 ENCOUNTER — Ambulatory Visit (HOSPITAL_COMMUNITY)
Admission: RE | Admit: 2016-01-15 | Discharge: 2016-01-15 | Disposition: A | Discharge status: Home / Self Care | Payer: BLUE CROSS/BLUE SHIELD | Source: Ambulatory Visit | Attending: Oncology | Admitting: Oncology

## 2016-01-15 ENCOUNTER — Encounter (HOSPITAL_COMMUNITY): Payer: Self-pay

## 2016-01-15 DIAGNOSIS — C154 Malignant neoplasm of middle third of esophagus: Secondary | ICD-10-CM | POA: Diagnosis not present

## 2016-01-15 DIAGNOSIS — R911 Solitary pulmonary nodule: Secondary | ICD-10-CM | POA: Insufficient documentation

## 2016-01-15 DIAGNOSIS — R590 Localized enlarged lymph nodes: Secondary | ICD-10-CM | POA: Insufficient documentation

## 2016-01-15 MED ORDER — IOPAMIDOL (ISOVUE-300) INJECTION 61%
75.0000 mL | Freq: Once | INTRAVENOUS | Status: AC | PRN
  Administered 2016-01-15: 75 mL via INTRAVENOUS

## 2016-01-17 ENCOUNTER — Ambulatory Visit (HOSPITAL_BASED_OUTPATIENT_CLINIC_OR_DEPARTMENT_OTHER): Payer: BLUE CROSS/BLUE SHIELD

## 2016-01-17 ENCOUNTER — Encounter: Payer: Self-pay | Admitting: *Deleted

## 2016-01-17 ENCOUNTER — Ambulatory Visit (HOSPITAL_BASED_OUTPATIENT_CLINIC_OR_DEPARTMENT_OTHER): Payer: BLUE CROSS/BLUE SHIELD | Admitting: Oncology

## 2016-01-17 VITALS — BP 115/71 | HR 58 | Temp 98.2°F | Resp 16 | Ht 71.0 in | Wt 206.1 lb

## 2016-01-17 DIAGNOSIS — Z5112 Encounter for antineoplastic immunotherapy: Secondary | ICD-10-CM | POA: Diagnosis not present

## 2016-01-17 DIAGNOSIS — C771 Secondary and unspecified malignant neoplasm of intrathoracic lymph nodes: Secondary | ICD-10-CM | POA: Diagnosis not present

## 2016-01-17 DIAGNOSIS — C155 Malignant neoplasm of lower third of esophagus: Secondary | ICD-10-CM

## 2016-01-17 DIAGNOSIS — C7801 Secondary malignant neoplasm of right lung: Secondary | ICD-10-CM | POA: Diagnosis not present

## 2016-01-17 DIAGNOSIS — C154 Malignant neoplasm of middle third of esophagus: Secondary | ICD-10-CM

## 2016-01-17 MED ORDER — SODIUM CHLORIDE 0.9 % IV SOLN
200.0000 mg | Freq: Once | INTRAVENOUS | Status: AC
  Administered 2016-01-17: 200 mg via INTRAVENOUS
  Filled 2016-01-17: qty 8

## 2016-01-17 MED ORDER — SODIUM CHLORIDE 0.9 % IV SOLN
Freq: Once | INTRAVENOUS | Status: AC
  Administered 2016-01-17: 12:00:00 via INTRAVENOUS

## 2016-01-17 NOTE — Patient Instructions (Signed)
[facility] Discharge Instructions for Patients Receiving Chemotherapy  Today you received the following chemotherapy agents: Keytruda   To help prevent nausea and vomiting after your treatment, we encourage you to take your nausea medication as directed    If you develop nausea and vomiting that is not controlled by your nausea medication, call the clinic.   BELOW ARE SYMPTOMS THAT SHOULD BE REPORTED IMMEDIATELY:  *FEVER GREATER THAN 100.5 F  *CHILLS WITH OR WITHOUT FEVER  NAUSEA AND VOMITING THAT IS NOT CONTROLLED WITH YOUR NAUSEA MEDICATION  *UNUSUAL SHORTNESS OF BREATH  *UNUSUAL BRUISING OR BLEEDING  TENDERNESS IN MOUTH AND THROAT WITH OR WITHOUT PRESENCE OF ULCERS  *URINARY PROBLEMS  *BOWEL PROBLEMS  UNUSUAL RASH Items with * indicate a potential emergency and should be followed up as soon as possible.  Feel free to call the clinic you have any questions or concerns. The clinic phone number is [phone].  Please show the CHEMO ALERT CARD at check-in to the Emergency Department and triage nurse.   

## 2016-01-17 NOTE — Progress Notes (Signed)
Mr. Kerce appears stable. He has been maintained on Pembrolizumab for the past 6 months. There is no clinical evidence of disease progression and the CT reveals minimal enlargement of mediastinal lymph nodes. There appears to be a mixed response with tiny lung nodules. I reviewed the CT images with him. We decided to continue the current treatment.  I will refer him to Dr.  Fanny Skates for a second opinion and to consider clinical trial eligibility at [facility].  He will return for an office visit in 3 weeks.   01/17/2016  10:25 AM

## 2016-01-17 NOTE — Progress Notes (Signed)
Oncology Nurse Navigator Documentation  Oncology Nurse Navigator Flowsheets 01/17/2016  Navigator Location [facility]  Navigator Encounter Type Treatment  Abnormal Finding Date -  Confirmed Diagnosis Date -  Treatment Initiated Date -  Patient Visit Type MedOnc  Treatment Phase Treatment-Pembrolizumab  Barriers/Navigation Needs Coordination of Care--referral to Dr. Karmen Stabs at [facility] in HIM aware of referral request  Support Groups/Services -  [employer] -  Time Spent with Patient 15

## 2016-01-18 ENCOUNTER — Telehealth: Payer: Self-pay | Admitting: Oncology

## 2016-01-18 NOTE — Telephone Encounter (Signed)
Pt appt with Dr. Fanny Skates @ [facility] is 02/01/16'@3'$ :00. Called pt left vm in ref to [facility] appt. Records in care  Care everywhere.

## 2016-01-23 ENCOUNTER — Telehealth: Payer: Self-pay | Admitting: Oncology

## 2016-01-23 NOTE — Telephone Encounter (Signed)
Message to HIM re [facility] referral.

## 2016-01-23 NOTE — Telephone Encounter (Signed)
Left message for patient re 10/11 appointments. Schedule mailed.

## 2016-02-06 ENCOUNTER — Encounter: Payer: Self-pay | Admitting: Pharmacist

## 2016-02-06 NOTE — Progress Notes (Signed)
I s/w [employer] rep, Scientist, research (physical sciences) by phone b/c on chart check today I noticed we had not received free Keytruda from [employer] for tomorrow's dose. I found out from [facility] that [employer] shipped 1 dose of Keytruda (200 mg) on 11/02/15 and never since.  No one ever requested refills from our end. I requested she go ahead and send the dose for 10/11 (will be shipped tomorrow for 10/12 delivery, so we'll use our stock tomorrow for his dose). I have also faxed a drug replacement request form, signed by Dr. Benay Spice, back to [employer] requesting replacement for doses administered on 11/29/15, 12/20/15 & 01/17/16.  Nevin Bloodgood stated it may take a while to hear back on the replacement depending on the demand/workload to process.  I've also asked that [employer] call us 14 days after each Keytruda dose to schedule the next refill.  This way we don't miss doses again.  Pt is approved for free Keytruda through 10/11/16.  Kennith Center, Pharm.D., CPP 02/06/2016'@4'$ :28 PM

## 2016-02-07 ENCOUNTER — Telehealth: Payer: Self-pay | Admitting: *Deleted

## 2016-02-07 ENCOUNTER — Other Ambulatory Visit (HOSPITAL_BASED_OUTPATIENT_CLINIC_OR_DEPARTMENT_OTHER): Payer: BLUE CROSS/BLUE SHIELD

## 2016-02-07 ENCOUNTER — Telehealth: Payer: Self-pay | Admitting: Oncology

## 2016-02-07 ENCOUNTER — Ambulatory Visit (HOSPITAL_BASED_OUTPATIENT_CLINIC_OR_DEPARTMENT_OTHER): Payer: BLUE CROSS/BLUE SHIELD

## 2016-02-07 ENCOUNTER — Ambulatory Visit (HOSPITAL_BASED_OUTPATIENT_CLINIC_OR_DEPARTMENT_OTHER): Payer: BLUE CROSS/BLUE SHIELD | Admitting: Oncology

## 2016-02-07 VITALS — BP 107/63 | HR 67 | Temp 98.5°F | Resp 18 | Ht 71.0 in | Wt 204.0 lb

## 2016-02-07 DIAGNOSIS — C155 Malignant neoplasm of lower third of esophagus: Secondary | ICD-10-CM

## 2016-02-07 DIAGNOSIS — C154 Malignant neoplasm of middle third of esophagus: Secondary | ICD-10-CM

## 2016-02-07 DIAGNOSIS — C7801 Secondary malignant neoplasm of right lung: Secondary | ICD-10-CM

## 2016-02-07 DIAGNOSIS — Z5112 Encounter for antineoplastic immunotherapy: Secondary | ICD-10-CM | POA: Diagnosis not present

## 2016-02-07 DIAGNOSIS — Z23 Encounter for immunization: Secondary | ICD-10-CM

## 2016-02-07 DIAGNOSIS — C771 Secondary and unspecified malignant neoplasm of intrathoracic lymph nodes: Secondary | ICD-10-CM

## 2016-02-07 DIAGNOSIS — C159 Malignant neoplasm of esophagus, unspecified: Secondary | ICD-10-CM

## 2016-02-07 LAB — CBC WITH DIFFERENTIAL/PLATELET
BASO%: 0.7 % (ref 0.0–2.0)
Basophils Absolute: 0 10*3/uL (ref 0.0–0.1)
EOS ABS: 0 10*3/uL (ref 0.0–0.5)
EOS%: 1 % (ref 0.0–7.0)
HCT: 41.3 % (ref 38.4–49.9)
HEMOGLOBIN: 13.8 g/dL (ref 13.0–17.1)
LYMPH%: 26 % (ref 14.0–49.0)
MCH: 29.4 pg (ref 27.2–33.4)
MCHC: 33.4 g/dL (ref 32.0–36.0)
MCV: 87.9 fL (ref 79.3–98.0)
MONO#: 0.3 10*3/uL (ref 0.1–0.9)
MONO%: 9.3 % (ref 0.0–14.0)
NEUT%: 63 % (ref 39.0–75.0)
NEUTROS ABS: 2.3 10*3/uL (ref 1.5–6.5)
Platelets: 150 10*3/uL (ref 140–400)
RBC: 4.7 10*6/uL (ref 4.20–5.82)
RDW: 13.1 % (ref 11.0–14.6)
WBC: 3.7 10*3/uL — AB (ref 4.0–10.3)
lymph#: 1 10*3/uL (ref 0.9–3.3)

## 2016-02-07 LAB — COMPREHENSIVE METABOLIC PANEL
ALBUMIN: 3.6 g/dL (ref 3.5–5.0)
ALK PHOS: 76 U/L (ref 40–150)
ALT: 15 U/L (ref 0–55)
AST: 20 U/L (ref 5–34)
Anion Gap: 8 mEq/L (ref 3–11)
BILIRUBIN TOTAL: 0.48 mg/dL (ref 0.20–1.20)
BUN: 15.3 mg/dL (ref 7.0–26.0)
CO2: 23 mEq/L (ref 22–29)
CREATININE: 1 mg/dL (ref 0.7–1.3)
Calcium: 9.3 mg/dL (ref 8.4–10.4)
Chloride: 110 mEq/L — ABNORMAL HIGH (ref 98–109)
EGFR: 79 mL/min/{1.73_m2} — AB (ref >90)
GLUCOSE: 113 mg/dL (ref 70–140)
Potassium: 4.3 mEq/L (ref 3.5–5.1)
SODIUM: 141 meq/L (ref 136–145)
TOTAL PROTEIN: 6.7 g/dL (ref 6.4–8.3)

## 2016-02-07 MED ORDER — INFLUENZA VAC SPLIT QUAD 0.5 ML IM SUSY
0.5000 mL | PREFILLED_SYRINGE | Freq: Once | INTRAMUSCULAR | Status: AC
  Administered 2016-02-07: 0.5 mL via INTRAMUSCULAR
  Filled 2016-02-07: qty 0.5

## 2016-02-07 MED ORDER — SODIUM CHLORIDE 0.9 % IV SOLN
200.0000 mg | Freq: Once | INTRAVENOUS | Status: AC
  Administered 2016-02-07: 200 mg via INTRAVENOUS
  Filled 2016-02-07: qty 4

## 2016-02-07 MED ORDER — SODIUM CHLORIDE 0.9 % IV SOLN
Freq: Once | INTRAVENOUS | Status: AC
  Administered 2016-02-07: 12:00:00 via INTRAVENOUS

## 2016-02-07 NOTE — Patient Instructions (Signed)
[facility] Discharge Instructions for Patients Receiving Chemotherapy  Today you received the following chemotherapy agents: Keytruda   To help prevent nausea and vomiting after your treatment, we encourage you to take your nausea medication as directed    If you develop nausea and vomiting that is not controlled by your nausea medication, call the clinic.   BELOW ARE SYMPTOMS THAT SHOULD BE REPORTED IMMEDIATELY:  *FEVER GREATER THAN 100.5 F  *CHILLS WITH OR WITHOUT FEVER  NAUSEA AND VOMITING THAT IS NOT CONTROLLED WITH YOUR NAUSEA MEDICATION  *UNUSUAL SHORTNESS OF BREATH  *UNUSUAL BRUISING OR BLEEDING  TENDERNESS IN MOUTH AND THROAT WITH OR WITHOUT PRESENCE OF ULCERS  *URINARY PROBLEMS  *BOWEL PROBLEMS  UNUSUAL RASH Items with * indicate a potential emergency and should be followed up as soon as possible.  Feel free to call the clinic you have any questions or concerns. The clinic phone number is [phone].  Please show the CHEMO ALERT CARD at check-in to the Emergency Department and triage nurse.   

## 2016-02-07 NOTE — Telephone Encounter (Signed)
Message sent to chemo scheduler to add chemo per 02/07/16 los. Avs report and appointment schedule given to patient per 02/07/16 los.

## 2016-02-07 NOTE — Telephone Encounter (Signed)
Per LOS I have scheduled appt and notified the scheduler

## 2016-02-07 NOTE — Progress Notes (Signed)
[place] OFFICE PROGRESS NOTE   Diagnosis: Esophagus cancer  INTERVAL HISTORY:   Billy Romero returns as scheduled. He completed another treatment with pembrolizumab on 01/17/2016. No new complaint. He reports the wheezing has improved. He is exercising and golfing. He saw Dr. Fanny Skates on 02/01/2016. He is not eligible for a current clinical trial at [facility]. She recommends continuing the current treatment.  Objective:  Vital signs in last 24 hours:  Blood pressure 107/63, pulse 67, temperature 98.5 F (36.9 C), temperature source Oral, resp. rate 18, height _0  (1.803 m), weight 204 lb (92.5 kg), SpO2 99 %.    HEENT: No thrush or ulcers Resp: Lungs clear bilaterally Cardio: Regular rate and rhythm GI: No hepatomegaly, nontender Vascular: No leg edema   Lab Results:  Lab Results  Component Value Date   WBC 3.7 (L) 02/07/2016   HGB 13.8 02/07/2016   HCT 41.3 02/07/2016   MCV 87.9 02/07/2016   PLT 150 02/07/2016   NEUTROABS 2.3 02/07/2016     Medications: I have reviewed the patient's current medications.  Assessment/Plan: . 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 Radiation completed 06/10/2014  PET scan 07/01/2014 revealed improvement in the hypermetabolic esophagus mass and mediastinal lymphadenopathy  upper endoscopy 04/20/2015 -mid esophagus with mild narrowing , 2 small nodules measuring approximate 5 mm -biopsy with scant and detached fragments with high-grade squamous dysplasia , brushings negative for malignant cells.  CT chest/abdomen/pelvis 05/25/2015 with development of bilateral pulmonary nodules; paratracheal and  subcarinal nodes improved/similar; new small lower paraesophageal nodes which are indeterminate; esophageal wall thickening improved since the prior PET; no evidence of metastatic disease in the abdomen or pelvis.  PET scan 06/16/2015 revealed hypermetabolic lung nodules, hypermetabolic mediastinal nodes, hypermetabolism at the mid to distal esophagus has improved  bronchoscopy/EBUS on 07/04/2015 confirmed squamous cell carcinoma involving right lower lobe brushings, right bronchial washings, a level 10 R lymph node biopsy, and right lower lobe biopsy  PD-L1 no expression on the right lower lobe biopsy (less than 1%)  Cycle 1 Pembrolizumab 07/26/2015  Cycle 2 Pembrolizumab 08/16/2015  Cycle 3 Pembrolizumab 09/06/2015  Cycle 4 Pembrolizumab 09/27/2015  CT chest 10/11/2015-stable/decreased size of pulmonary nodules, enlargement of several mediastinal nodes  Cycle 5 Pembrolizumab 10/18/2015  Cycle 6 Pembrolizumab 11/08/2015   Cycle 7 Pembrolizumab08/05/2015  Cycle 8 Pembrolizumab 12/20/2015  CT chest 01/15/2016-slight progression of mediastinal/upper abdominal lymphadenopathy and enlargement of a right lower lobe nodule  Cycle 9 Pembrolizumab 01/17/2016   Cycle 10 Pembrolizumab 02/07/2016  2. Solid/liquid dysphagia secondary to #1  Jejunostomy feeding tube placement 04/07/2014  Feeding tube removed 05/30/2014.  3. Small bowel obstruction following placement of the jejunostomy feeding tube-resolved  4. History of ulcerative colitis  5. Aspiration pneumonia December 2015     Disposition:  Billy Romero appears stable. There is no clinical evidence of disease progression. The restaging CT on 01/15/2016 revealed mild enlargement of mediastinal lymph nodes. He saw Dr. Fanny Skates and I discussed the case with her. The plan is to continue the current treatment until there is more significant evidence of disease progression.  He will complete another treatment with   pembroluzmab today. He will return for an office visit and pembrolizumab in 3 weeks.  Betsy Coder, MD  02/07/2016  10:43 AM

## 2016-02-28 ENCOUNTER — Telehealth: Payer: Self-pay | Admitting: *Deleted

## 2016-02-28 ENCOUNTER — Ambulatory Visit (HOSPITAL_BASED_OUTPATIENT_CLINIC_OR_DEPARTMENT_OTHER): Payer: BLUE CROSS/BLUE SHIELD | Admitting: Nurse Practitioner

## 2016-02-28 ENCOUNTER — Telehealth: Payer: Self-pay | Admitting: Nurse Practitioner

## 2016-02-28 ENCOUNTER — Ambulatory Visit (HOSPITAL_BASED_OUTPATIENT_CLINIC_OR_DEPARTMENT_OTHER): Payer: BLUE CROSS/BLUE SHIELD

## 2016-02-28 VITALS — BP 110/73 | HR 84 | Temp 97.5°F | Resp 17 | Ht 71.0 in | Wt 202.5 lb

## 2016-02-28 DIAGNOSIS — C154 Malignant neoplasm of middle third of esophagus: Secondary | ICD-10-CM

## 2016-02-28 DIAGNOSIS — Z5112 Encounter for antineoplastic immunotherapy: Secondary | ICD-10-CM | POA: Diagnosis not present

## 2016-02-28 DIAGNOSIS — C771 Secondary and unspecified malignant neoplasm of intrathoracic lymph nodes: Secondary | ICD-10-CM

## 2016-02-28 DIAGNOSIS — C159 Malignant neoplasm of esophagus, unspecified: Secondary | ICD-10-CM

## 2016-02-28 DIAGNOSIS — C7801 Secondary malignant neoplasm of right lung: Secondary | ICD-10-CM

## 2016-02-28 MED ORDER — SODIUM CHLORIDE 0.9 % IV SOLN
200.0000 mg | Freq: Once | INTRAVENOUS | Status: AC
  Administered 2016-02-28: 200 mg via INTRAVENOUS
  Filled 2016-02-28: qty 8

## 2016-02-28 MED ORDER — SODIUM CHLORIDE 0.9 % IV SOLN
Freq: Once | INTRAVENOUS | Status: AC
  Administered 2016-02-28: 11:00:00 via INTRAVENOUS

## 2016-02-28 NOTE — Telephone Encounter (Signed)
Per LOS I have scheduled appts and notified the scheduler 

## 2016-02-28 NOTE — Progress Notes (Signed)
[place] OFFICE PROGRESS NOTE   Diagnosis:  Esophagus cancer  INTERVAL HISTORY:   Billy Romero returns as scheduled. He completed cycle 10 Pembrolizumab 02/07/2016. He denies nausea/vomiting. No mouth sores. No diarrhea. No rash. No shortness of breath. No cough. He intermittently wheezes. He attributes this to allergies. He remains very active. He has a good appetite.  Objective:  Vital signs in last 24 hours:  Blood pressure 110/73, pulse 84, temperature 97.5 F (36.4 C), temperature source Oral, resp. rate 17, height 5' 11"  (1.803 m), weight 202 lb 8 oz (91.9 kg), SpO2 99 %.    HEENT: No thrush or ulcers. Resp: Expiratory wheezes right lung field, cleared with coughing. Lungs otherwise clear. Cardio: Regular rate and rhythm. GI: Abdomen soft and nontender. No hepatomegaly. Vascular: No leg edema. Skin: No rash.    Lab Results:  Lab Results  Component Value Date   WBC 3.7 (L) 02/07/2016   HGB 13.8 02/07/2016   HCT 41.3 02/07/2016   MCV 87.9 02/07/2016   PLT 150 02/07/2016   NEUTROABS 2.3 02/07/2016    Imaging:  No results found.  Medications: I have reviewed the patient's current medications.  Assessment/Plan: 1. 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 Radiation completed 06/10/2014  PET scan 07/01/2014 revealed improvement in the hypermetabolic esophagus mass and mediastinal lymphadenopathy  upper endoscopy 04/20/2015 -mid esophagus with mild narrowing , 2 small nodules measuring approximate 5 mm -biopsy with scant and detached fragments with high-grade squamous dysplasia , brushings negative for malignant cells.  CT  chest/abdomen/pelvis 05/25/2015 with development of bilateral pulmonary nodules; paratracheal and subcarinal nodes improved/similar; new small lower paraesophageal nodes which are indeterminate; esophageal wall thickening improved since the prior PET; no evidence of metastatic disease in the abdomen or pelvis.  PET scan 06/16/2015 revealed hypermetabolic lung nodules, hypermetabolic mediastinal nodes, hypermetabolism at the mid to distal esophagus has improved  bronchoscopy/EBUS on 07/04/2015 confirmed squamous cell carcinoma involving right lower lobe brushings, right bronchial washings, a level 10 R lymph node biopsy, and right lower lobe biopsy  PD-L1 no expression on the right lower lobe biopsy (less than 1%)  Cycle 1 Pembrolizumab 07/26/2015  Cycle 2 Pembrolizumab 08/16/2015  Cycle 3 Pembrolizumab 09/06/2015  Cycle 4 Pembrolizumab 09/27/2015  CT chest 10/11/2015-stable/decreased size of pulmonary nodules, enlargement of several mediastinal nodes  Cycle 5 Pembrolizumab 10/18/2015  Cycle 6 Pembrolizumab 11/08/2015   Cycle 7 Pembrolizumab08/05/2015  Cycle 8 Pembrolizumab08/23/2017  CT chest 01/15/2016-slight progression of mediastinal/upper abdominal lymphadenopathy and enlargement of a right lower lobe nodule  Cycle 9 Pembrolizumab 01/17/2016   Cycle 10 Pembrolizumab 02/07/2016  Cycle 11 Pembrolizumab 02/28/2016  2. Solid/liquid dysphagia secondary to #1  Jejunostomy feeding tube placement 04/07/2014  Feeding tube removed 05/30/2014.  3. Small bowel obstruction following placement of the jejunostomy feeding tube-resolved  4. History of ulcerative colitis  5. Aspiration pneumonia December 2015   Disposition: Billy Romero appears stable. He has completed 10 cycles of Pembrolizumab. Plan to proceed with cycle 11 today as scheduled. He will return for a follow-up visit and Pembrolizumab in 3 weeks. The plan is to obtain a restaging chest CT at a 12 week interval  from the previous which will be mid December. He will contact the office prior to his  next visit with any problems.    Ned Card ANP/GNP-BC   02/28/2016  9:41 AM

## 2016-02-28 NOTE — Telephone Encounter (Signed)
Appointments scheduled per 11/1 LOS. Patient given AVS report and calendars with future scheduled appointments.  °

## 2016-02-28 NOTE — Addendum Note (Signed)
Addended by: Betsy Coder B on: 02/28/2016 11:01 AM   Modules accepted: Orders

## 2016-02-28 NOTE — Patient Instructions (Signed)
[facility] Discharge Instructions for Patients Receiving Chemotherapy  Today you received the following chemotherapy agents: Keytruda   To help prevent nausea and vomiting after your treatment, we encourage you to take your nausea medication as directed    If you develop nausea and vomiting that is not controlled by your nausea medication, call the clinic.   BELOW ARE SYMPTOMS THAT SHOULD BE REPORTED IMMEDIATELY:  *FEVER GREATER THAN 100.5 F  *CHILLS WITH OR WITHOUT FEVER  NAUSEA AND VOMITING THAT IS NOT CONTROLLED WITH YOUR NAUSEA MEDICATION  *UNUSUAL SHORTNESS OF BREATH  *UNUSUAL BRUISING OR BLEEDING  TENDERNESS IN MOUTH AND THROAT WITH OR WITHOUT PRESENCE OF ULCERS  *URINARY PROBLEMS  *BOWEL PROBLEMS  UNUSUAL RASH Items with * indicate a potential emergency and should be followed up as soon as possible.  Feel free to call the clinic you have any questions or concerns. The clinic phone number is [phone].  Please show the CHEMO ALERT CARD at check-in to the Emergency Department and triage nurse.   

## 2016-03-17 ENCOUNTER — Other Ambulatory Visit: Payer: Self-pay | Admitting: Oncology

## 2016-03-20 ENCOUNTER — Ambulatory Visit (HOSPITAL_BASED_OUTPATIENT_CLINIC_OR_DEPARTMENT_OTHER): Payer: BLUE CROSS/BLUE SHIELD

## 2016-03-20 ENCOUNTER — Ambulatory Visit (HOSPITAL_BASED_OUTPATIENT_CLINIC_OR_DEPARTMENT_OTHER): Payer: BLUE CROSS/BLUE SHIELD | Admitting: Oncology

## 2016-03-20 ENCOUNTER — Other Ambulatory Visit (HOSPITAL_BASED_OUTPATIENT_CLINIC_OR_DEPARTMENT_OTHER): Payer: BLUE CROSS/BLUE SHIELD

## 2016-03-20 VITALS — BP 109/71 | HR 73 | Temp 97.8°F | Resp 18 | Wt 205.1 lb

## 2016-03-20 DIAGNOSIS — R05 Cough: Secondary | ICD-10-CM

## 2016-03-20 DIAGNOSIS — C771 Secondary and unspecified malignant neoplasm of intrathoracic lymph nodes: Secondary | ICD-10-CM | POA: Diagnosis not present

## 2016-03-20 DIAGNOSIS — C7801 Secondary malignant neoplasm of right lung: Secondary | ICD-10-CM

## 2016-03-20 DIAGNOSIS — L989 Disorder of the skin and subcutaneous tissue, unspecified: Secondary | ICD-10-CM | POA: Diagnosis not present

## 2016-03-20 DIAGNOSIS — C159 Malignant neoplasm of esophagus, unspecified: Secondary | ICD-10-CM

## 2016-03-20 DIAGNOSIS — Z79899 Other long term (current) drug therapy: Secondary | ICD-10-CM

## 2016-03-20 DIAGNOSIS — Z5112 Encounter for antineoplastic immunotherapy: Secondary | ICD-10-CM | POA: Diagnosis not present

## 2016-03-20 DIAGNOSIS — C155 Malignant neoplasm of lower third of esophagus: Secondary | ICD-10-CM

## 2016-03-20 DIAGNOSIS — C154 Malignant neoplasm of middle third of esophagus: Secondary | ICD-10-CM

## 2016-03-20 LAB — COMPREHENSIVE METABOLIC PANEL
ALT: 16 U/L (ref 0–55)
ANION GAP: 9 meq/L (ref 3–11)
AST: 21 U/L (ref 5–34)
Albumin: 3.6 g/dL (ref 3.5–5.0)
Alkaline Phosphatase: 83 U/L (ref 40–150)
BILIRUBIN TOTAL: 0.71 mg/dL (ref 0.20–1.20)
BUN: 17.6 mg/dL (ref 7.0–26.0)
CALCIUM: 9.6 mg/dL (ref 8.4–10.4)
CHLORIDE: 107 meq/L (ref 98–109)
CO2: 23 meq/L (ref 22–29)
CREATININE: 1 mg/dL (ref 0.7–1.3)
EGFR: 80 mL/min/{1.73_m2} — AB (ref >90)
Glucose: 111 mg/dl (ref 70–140)
Potassium: 4 mEq/L (ref 3.5–5.1)
Sodium: 140 mEq/L (ref 136–145)
Total Protein: 6.8 g/dL (ref 6.4–8.3)

## 2016-03-20 LAB — CBC WITH DIFFERENTIAL/PLATELET
BASO%: 0.2 % (ref 0.0–2.0)
BASOS ABS: 0 10*3/uL (ref 0.0–0.1)
EOS ABS: 0 10*3/uL (ref 0.0–0.5)
EOS%: 1 % (ref 0.0–7.0)
HEMATOCRIT: 40.2 % (ref 38.4–49.9)
HGB: 13.9 g/dL (ref 13.0–17.1)
LYMPH#: 1 10*3/uL (ref 0.9–3.3)
LYMPH%: 24.3 % (ref 14.0–49.0)
MCH: 29.8 pg (ref 27.2–33.4)
MCHC: 34.6 g/dL (ref 32.0–36.0)
MCV: 86.1 fL (ref 79.3–98.0)
MONO#: 0.4 10*3/uL (ref 0.1–0.9)
MONO%: 8.7 % (ref 0.0–14.0)
NEUT#: 2.7 10*3/uL (ref 1.5–6.5)
NEUT%: 65.8 % (ref 39.0–75.0)
PLATELETS: 155 10*3/uL (ref 140–400)
RBC: 4.67 10*6/uL (ref 4.20–5.82)
RDW: 13.1 % (ref 11.0–14.6)
WBC: 4.1 10*3/uL (ref 4.0–10.3)

## 2016-03-20 LAB — TSH: TSH: 2.673 m[IU]/L (ref 0.320–4.118)

## 2016-03-20 MED ORDER — SODIUM CHLORIDE 0.9 % IV SOLN
Freq: Once | INTRAVENOUS | Status: AC
  Administered 2016-03-20: 10:00:00 via INTRAVENOUS

## 2016-03-20 MED ORDER — SODIUM CHLORIDE 0.9 % IV SOLN
200.0000 mg | Freq: Once | INTRAVENOUS | Status: AC
  Administered 2016-03-20: 200 mg via INTRAVENOUS
  Filled 2016-03-20: qty 8

## 2016-03-20 NOTE — Patient Instructions (Signed)
Pembrolizumab injection What is this medicine? PEMBROLIZUMAB (pem broe liz ue mab) is a monoclonal antibody. It is used to treat melanoma, head and neck cancer, Hodgkin lymphoma, and non-small cell lung cancer. COMMON BRAND NAME(S): Keytruda What should I tell my health care provider before I take this medicine? They need to know if you have any of these conditions: -diabetes -immune system problems -inflammatory bowel disease -liver disease -lung or breathing disease -lupus -organ transplant -an unusual or allergic reaction to pembrolizumab, other medicines, foods, dyes, or preservatives -pregnant or trying to get pregnant -breast-feeding How should I use this medicine? This medicine is for infusion into a vein. It is given by a health care professional in a hospital or clinic setting. A special MedGuide will be given to you before each treatment. Be sure to read this information carefully each time. Talk to your pediatrician regarding the use of this medicine in children. While this drug may be prescribed for selected conditions, precautions do apply. What if I miss a dose? It is important not to miss your dose. Call your doctor or health care professional if you are unable to keep an appointment. What may interact with this medicine? Interactions have not been studied. Give your health care provider a list of all the medicines, herbs, non-prescription drugs, or dietary supplements you use. Also tell them if you smoke, drink alcohol, or use illegal drugs. Some items may interact with your medicine. What should I watch for while using this medicine? Your condition will be monitored carefully while you are receiving this medicine. You may need blood work done while you are taking this medicine. Do not become pregnant while taking this medicine or for 4 months after stopping it. Women should inform their doctor if they wish to become pregnant or think they might be pregnant. There is a  potential for serious side effects to an unborn child. Talk to your health care professional or pharmacist for more information. Do not breast-feed an infant while taking this medicine or for 4 months after the last dose. What side effects may I notice from receiving this medicine? Side effects that you should report to your doctor or health care professional as soon as possible: -allergic reactions like skin rash, itching or hives, swelling of the face, lips, or tongue -bloody or black, tarry -breathing problems -changes in vision -chest pain -chills -constipation -cough -dizziness or feeling faint or lightheaded -fast or irregular heartbeat -fever -flushing -hair loss -low blood counts - this medicine may decrease the number of white blood cells, red blood cells and platelets. You may be at increased risk for infections and bleeding. -muscle pain -muscle weakness -persistent headache -signs and symptoms of high blood sugar such as dizziness; dry mouth; dry skin; fruity breath; nausea; stomach pain; increased hunger or thirst; increased urination -signs and symptoms of kidney injury like trouble passing urine or change in the amount of urine -signs and symptoms of liver injury like dark urine, light-colored stools, loss of appetite, nausea, right upper belly pain, yellowing of the eyes or skin -stomach pain -sweating -weight loss Side effects that usually do not require medical attention (report to your doctor or health care professional if they continue or are bothersome): -decreased appetite -diarrhea -tiredness Where should I keep my medicine? This drug is given in a hospital or clinic and will not be stored at home.  2017 Elsevier/Gold Standard (2015-07-17 19:28:44)

## 2016-03-20 NOTE — Progress Notes (Addendum)
[place] OFFICE PROGRESS NOTE   Diagnosis: Esophagus cancer  INTERVAL HISTORY:   Mr. Mehring returns as scheduled. He reports feeling well. He continues to exercise. No dysphagia. Intermittent cough. He has noted enlargement of a skin lesion at the right side of his nose. No rash or diarrhea.  Objective:  Vital signs in last 24 hours:  Blood pressure 109/71, pulse 73, temperature 97.8 F (36.6 C), temperature source Oral, resp. rate 18, weight 205 lb 1.6 oz (93 kg), SpO2 99 %.    HEENT: No thrush or ulcers Lymphatics: No cervical or supraclavicular nodes Resp: Lungs with mild expiratory wheezing at the right lower posterior chest, good air movement bilaterally, no respiratory distress Cardio: Regular rate and rhythm GI: No hepatomegaly, nontender Vascular: No leg edema  Skin: 1/2 cm "pearly "raised lesion at the right upper nasal bridge     Lab Results:  Lab Results  Component Value Date   WBC 4.1 03/20/2016   HGB 13.9 03/20/2016   HCT 40.2 03/20/2016   MCV 86.1 03/20/2016   PLT 155 03/20/2016   NEUTROABS 2.7 03/20/2016     Medications: I have reviewed the patient's current medications.  Assessment/Plan: 1. 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 Radiation completed 06/10/2014  PET scan 07/01/2014 revealed improvement in the hypermetabolic esophagus mass and mediastinal lymphadenopathy  upper endoscopy 04/20/2015 -mid esophagus with mild narrowing , 2 small nodules measuring approximate 5 mm -biopsy with scant and detached fragments with high-grade squamous dysplasia , brushings negative for malignant cells.  CT  chest/abdomen/pelvis 05/25/2015 with development of bilateral pulmonary nodules; paratracheal and subcarinal nodes improved/similar; new small lower paraesophageal nodes which are indeterminate; esophageal wall thickening improved since the prior PET; no evidence of metastatic disease in the abdomen or pelvis.  PET scan 06/16/2015 revealed hypermetabolic lung nodules, hypermetabolic mediastinal nodes, hypermetabolism at the mid to distal esophagus has improved  bronchoscopy/EBUS on 07/04/2015 confirmed squamous cell carcinoma involving right lower lobe brushings, right bronchial washings, a level 10 R lymph node biopsy, and right lower lobe biopsy  PD-L1 no expression on the right lower lobe biopsy (less than 1%)  Cycle 1 Pembrolizumab 07/26/2015  Cycle 2 Pembrolizumab 08/16/2015  Cycle 3 Pembrolizumab 09/06/2015  Cycle 4 Pembrolizumab 09/27/2015  CT chest 10/11/2015-stable/decreased size of pulmonary nodules, enlargement of several mediastinal nodes  Cycle 5 Pembrolizumab 10/18/2015  Cycle 6 Pembrolizumab 11/08/2015   Cycle 7 Pembrolizumab08/05/2015  Cycle 8 Pembrolizumab08/23/2017  CT chest 01/15/2016-slight progression of mediastinal/upper abdominal lymphadenopathy and enlargement of a right lower lobe nodule  Cycle 9 Pembrolizumab 01/17/2016   Cycle 10 Pembrolizumab10/02/2016  Cycle 11 Pembrolizumab 02/28/2016  Cycle 12 Pembrolizumab 03/20/2016  2. Solid/liquid dysphagia secondary to #1  Jejunostomy feeding tube placement 04/07/2014  Feeding tube removed 05/30/2014.  3. Small bowel obstruction following placement of the jejunostomy feeding tube-resolved  4. History of ulcerative colitis  5. Aspiration pneumonia December 2015    Disposition:  Billy Romero appears stable. He will complete another treatment with Pembrolizumab today. He will return for an office visit and Pembrolizumab in 3 weeks. He will undergo a restaging chest CT after the next  treatment.  [name] will schedule upon with dermatology to evaluate the right nose skin lesion.   Betsy Coder, MD  03/20/2016  9:13 AM

## 2016-03-22 ENCOUNTER — Telehealth: Payer: Self-pay | Admitting: Oncology

## 2016-03-22 ENCOUNTER — Other Ambulatory Visit: Payer: Self-pay | Admitting: *Deleted

## 2016-03-22 ENCOUNTER — Other Ambulatory Visit: Payer: Self-pay | Admitting: Oncology

## 2016-03-22 DIAGNOSIS — C155 Malignant neoplasm of lower third of esophagus: Secondary | ICD-10-CM

## 2016-03-22 NOTE — Telephone Encounter (Signed)
Spoke with patient re 12/13 appointments.  Per patient he is also to have ct - no order - left message for desk nurse - patient aware.

## 2016-04-07 ENCOUNTER — Other Ambulatory Visit: Payer: Self-pay | Admitting: Oncology

## 2016-04-09 ENCOUNTER — Other Ambulatory Visit: Payer: Self-pay | Admitting: *Deleted

## 2016-04-10 ENCOUNTER — Encounter: Payer: Self-pay | Admitting: *Deleted

## 2016-04-10 ENCOUNTER — Ambulatory Visit (HOSPITAL_BASED_OUTPATIENT_CLINIC_OR_DEPARTMENT_OTHER): Payer: BLUE CROSS/BLUE SHIELD | Admitting: Oncology

## 2016-04-10 ENCOUNTER — Ambulatory Visit (HOSPITAL_BASED_OUTPATIENT_CLINIC_OR_DEPARTMENT_OTHER): Payer: BLUE CROSS/BLUE SHIELD

## 2016-04-10 ENCOUNTER — Telehealth: Payer: Self-pay | Admitting: Oncology

## 2016-04-10 ENCOUNTER — Other Ambulatory Visit: Payer: BLUE CROSS/BLUE SHIELD

## 2016-04-10 VITALS — BP 123/70 | HR 73 | Temp 97.9°F | Resp 18 | Ht 71.0 in | Wt 203.0 lb

## 2016-04-10 DIAGNOSIS — C154 Malignant neoplasm of middle third of esophagus: Secondary | ICD-10-CM

## 2016-04-10 DIAGNOSIS — C159 Malignant neoplasm of esophagus, unspecified: Secondary | ICD-10-CM

## 2016-04-10 DIAGNOSIS — Z5112 Encounter for antineoplastic immunotherapy: Secondary | ICD-10-CM

## 2016-04-10 DIAGNOSIS — C7801 Secondary malignant neoplasm of right lung: Secondary | ICD-10-CM

## 2016-04-10 DIAGNOSIS — C771 Secondary and unspecified malignant neoplasm of intrathoracic lymph nodes: Secondary | ICD-10-CM

## 2016-04-10 MED ORDER — SODIUM CHLORIDE 0.9 % IV SOLN
Freq: Once | INTRAVENOUS | Status: AC
  Administered 2016-04-10: 10:00:00 via INTRAVENOUS

## 2016-04-10 MED ORDER — SODIUM CHLORIDE 0.9 % IV SOLN
200.0000 mg | Freq: Once | INTRAVENOUS | Status: AC
  Administered 2016-04-10: 200 mg via INTRAVENOUS
  Filled 2016-04-10: qty 8

## 2016-04-10 NOTE — Telephone Encounter (Signed)
Message sent to chemo scheduler to be added per 04/10/16 los.  Appointments scheduled per 04/10/16 los. A copy of the AVS report and appointment schedule was given to the patient per 04/09/16 los.

## 2016-04-10 NOTE — Progress Notes (Signed)
Okay to treat today with labs from  03-20-16 per Dr. Benay Spice.

## 2016-04-10 NOTE — Progress Notes (Signed)
Oncology Nurse Navigator Documentation  Oncology Nurse Navigator Flowsheets 04/10/2016  Navigator Location [facility]  Referral date to RadOnc/MedOnc -  Navigator Encounter Type Treatment  Abnormal Finding Date -  Confirmed Diagnosis Date -  Treatment Initiated Date -  Patient Visit Type MedOnc  Treatment Phase Active Tx--Pembrolizumab   Barriers/Navigation Needs No barriers at this time;No Questions;No Needs  Interventions None required  Coordination of Care -  Support Groups/Services -  Acuity -  Time Spent with Patient 15

## 2016-04-10 NOTE — Patient Instructions (Signed)
[facility] Discharge Instructions for Patients Receiving Chemotherapy  Today you received the following chemotherapy agents: Keytruda   To help prevent nausea and vomiting after your treatment, we encourage you to take your nausea medication as directed    If you develop nausea and vomiting that is not controlled by your nausea medication, call the clinic.   BELOW ARE SYMPTOMS THAT SHOULD BE REPORTED IMMEDIATELY:  *FEVER GREATER THAN 100.5 F  *CHILLS WITH OR WITHOUT FEVER  NAUSEA AND VOMITING THAT IS NOT CONTROLLED WITH YOUR NAUSEA MEDICATION  *UNUSUAL SHORTNESS OF BREATH  *UNUSUAL BRUISING OR BLEEDING  TENDERNESS IN MOUTH AND THROAT WITH OR WITHOUT PRESENCE OF ULCERS  *URINARY PROBLEMS  *BOWEL PROBLEMS  UNUSUAL RASH Items with * indicate a potential emergency and should be followed up as soon as possible.  Feel free to call the clinic you have any questions or concerns. The clinic phone number is [phone].  Please show the CHEMO ALERT CARD at check-in to the Emergency Department and triage nurse.   

## 2016-04-10 NOTE — Progress Notes (Signed)
[place] OFFICE PROGRESS NOTE   Diagnosis: Esophagus cancer  INTERVAL HISTORY:   Billy Romero returns as scheduled. He feels well. No dysphagia. No new complaint. He continues to play golf. He was last treated with pembrolizumab 03/20/2016.  Objective:  Vital signs in last 24 hours:  Blood pressure 123/70, pulse 73, temperature 97.9 F (36.6 C), temperature source Oral, resp. rate 18, height 5' 11"  (1.803 m), weight 203 lb (92.1 kg), SpO2 99 %.     Resp:Lungs clear bilaterally  Cardiac: Regular rate and rhythm GI: No hepatomegaly, nontender Vascular: No leg edema  Skin: No rash     Lab Results:  Lab Results  Component Value Date   WBC 4.1 03/20/2016   HGB 13.9 03/20/2016   HCT 40.2 03/20/2016   MCV 86.1 03/20/2016   PLT 155 03/20/2016   NEUTROABS 2.7 03/20/2016     Medications: I have reviewed the patient's current medications. Assessment/plan:  1. 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 Radiation completed 06/10/2014  PET scan 07/01/2014 revealed improvement in the hypermetabolic esophagus mass and mediastinal lymphadenopathy  upper endoscopy 04/20/2015 -mid esophagus with mild narrowing , 2 small nodules measuring approximate 5 mm -biopsy with scant and detached fragments with high-grade squamous dysplasia , brushings negative for malignant cells.  CT chest/abdomen/pelvis 05/25/2015 with development of bilateral pulmonary nodules; paratracheal and subcarinal nodes improved/similar; new small lower paraesophageal nodes which are indeterminate; esophageal wall thickening improved since the prior PET; no evidence of  metastatic disease in the abdomen or pelvis.  PET scan 06/16/2015 revealed hypermetabolic lung nodules, hypermetabolic mediastinal nodes, hypermetabolism at the mid to distal esophagus has improved  bronchoscopy/EBUS on 07/04/2015 confirmed squamous cell carcinoma involving right lower lobe brushings, right bronchial washings, a level 10 R lymph node biopsy, and right lower lobe biopsy  PD-L1 no expression on the right lower lobe biopsy (less than 1%)  Cycle 1 Pembrolizumab 07/26/2015  Cycle 2 Pembrolizumab 08/16/2015  Cycle 3 Pembrolizumab 09/06/2015  Cycle 4 Pembrolizumab 09/27/2015  CT chest 10/11/2015-stable/decreased size of pulmonary nodules, enlargement of several mediastinal nodes  Cycle 5 Pembrolizumab 10/18/2015  Cycle 6 Pembrolizumab 11/08/2015   Cycle 7 Pembrolizumab08/05/2015  Cycle 8 Pembrolizumab08/23/2017  CT chest 01/15/2016-slight progression of mediastinal/upper abdominal lymphadenopathy and enlargement of a right lower lobe nodule  Cycle 9 Pembrolizumab 01/17/2016   Cycle 10 Pembrolizumab10/02/2016  Cycle 11 Pembrolizumab 02/28/2016  Cycle 12 Pembrolizumab 03/20/2016  2. Solid/liquid dysphagia secondary to #1  Jejunostomy feeding tube placement 04/07/2014  Feeding tube removed 05/30/2014.  3. Small bowel obstruction following placement of the jejunostomy feeding tube-resolved  4. History of ulcerative colitis  5. Aspiration pneumonia December 2015   Disposition:  [name] appears stable. He will complete another treatment with pembrolizumab today. He is scheduled for a restaging chest CT on 04/25/2016. He will return for an office and lab visit on 04/30/2016.  Betsy Coder, MD  04/10/2016  10:04 AM

## 2016-04-11 ENCOUNTER — Telehealth: Payer: Self-pay | Admitting: *Deleted

## 2016-04-11 NOTE — Telephone Encounter (Signed)
Per LOS I have scheduled appts and notified the scheduler 

## 2016-04-15 ENCOUNTER — Other Ambulatory Visit: Payer: Self-pay | Admitting: Dermatology

## 2016-04-25 ENCOUNTER — Ambulatory Visit (HOSPITAL_COMMUNITY)
Admission: RE | Admit: 2016-04-25 | Discharge: 2016-04-25 | Disposition: A | Discharge status: Home / Self Care | Payer: BLUE CROSS/BLUE SHIELD | Source: Ambulatory Visit | Attending: Oncology | Admitting: Oncology

## 2016-04-25 DIAGNOSIS — J811 Chronic pulmonary edema: Secondary | ICD-10-CM | POA: Diagnosis not present

## 2016-04-25 DIAGNOSIS — R918 Other nonspecific abnormal finding of lung field: Secondary | ICD-10-CM | POA: Insufficient documentation

## 2016-04-25 DIAGNOSIS — R590 Localized enlarged lymph nodes: Secondary | ICD-10-CM | POA: Insufficient documentation

## 2016-04-25 DIAGNOSIS — C155 Malignant neoplasm of lower third of esophagus: Secondary | ICD-10-CM | POA: Diagnosis present

## 2016-04-25 MED ORDER — IOPAMIDOL (ISOVUE-300) INJECTION 61%
75.0000 mL | Freq: Once | INTRAVENOUS | Status: AC | PRN
  Administered 2016-04-25: 75 mL via INTRAVENOUS

## 2016-04-29 ENCOUNTER — Other Ambulatory Visit: Payer: Self-pay | Admitting: Oncology

## 2016-04-30 ENCOUNTER — Telehealth: Payer: Self-pay | Admitting: Oncology

## 2016-04-30 ENCOUNTER — Ambulatory Visit: Payer: BLUE CROSS/BLUE SHIELD | Admitting: Oncology

## 2016-04-30 ENCOUNTER — Ambulatory Visit
Admit: 2016-04-30 | Discharge: 2016-04-30 | Disposition: A | Discharge status: Home / Self Care | Payer: BLUE CROSS/BLUE SHIELD | Source: Ambulatory Visit | Attending: Radiation Oncology | Admitting: Radiation Oncology

## 2016-04-30 ENCOUNTER — Encounter (HOSPITAL_COMMUNITY): Payer: Self-pay

## 2016-04-30 ENCOUNTER — Observation Stay (HOSPITAL_COMMUNITY)
Admission: EM | Admit: 2016-04-30 | Discharge: 2016-05-01 | Disposition: A | Discharge status: Home / Self Care | Payer: BLUE CROSS/BLUE SHIELD | Attending: Internal Medicine | Admitting: Internal Medicine

## 2016-04-30 ENCOUNTER — Emergency Department (HOSPITAL_COMMUNITY): Payer: BLUE CROSS/BLUE SHIELD

## 2016-04-30 ENCOUNTER — Encounter: Payer: Self-pay | Admitting: Oncology

## 2016-04-30 ENCOUNTER — Ambulatory Visit: Payer: BLUE CROSS/BLUE SHIELD

## 2016-04-30 DIAGNOSIS — J341 Cyst and mucocele of nose and nasal sinus: Secondary | ICD-10-CM

## 2016-04-30 DIAGNOSIS — C771 Secondary and unspecified malignant neoplasm of intrathoracic lymph nodes: Secondary | ICD-10-CM | POA: Diagnosis not present

## 2016-04-30 DIAGNOSIS — M549 Dorsalgia, unspecified: Secondary | ICD-10-CM | POA: Diagnosis not present

## 2016-04-30 DIAGNOSIS — R590 Localized enlarged lymph nodes: Secondary | ICD-10-CM | POA: Insufficient documentation

## 2016-04-30 DIAGNOSIS — C3431 Malignant neoplasm of lower lobe, right bronchus or lung: Secondary | ICD-10-CM | POA: Insufficient documentation

## 2016-04-30 DIAGNOSIS — G8929 Other chronic pain: Secondary | ICD-10-CM | POA: Insufficient documentation

## 2016-04-30 DIAGNOSIS — C159 Malignant neoplasm of esophagus, unspecified: Secondary | ICD-10-CM | POA: Diagnosis not present

## 2016-04-30 DIAGNOSIS — C155 Malignant neoplasm of lower third of esophagus: Secondary | ICD-10-CM

## 2016-04-30 DIAGNOSIS — C7951 Secondary malignant neoplasm of bone: Secondary | ICD-10-CM | POA: Insufficient documentation

## 2016-04-30 DIAGNOSIS — C7801 Secondary malignant neoplasm of right lung: Secondary | ICD-10-CM | POA: Diagnosis not present

## 2016-04-30 DIAGNOSIS — R1310 Dysphagia, unspecified: Secondary | ICD-10-CM | POA: Diagnosis not present

## 2016-04-30 DIAGNOSIS — H532 Diplopia: Secondary | ICD-10-CM

## 2016-04-30 DIAGNOSIS — Y93E1 Activity, personal bathing and showering: Secondary | ICD-10-CM | POA: Insufficient documentation

## 2016-04-30 DIAGNOSIS — R55 Syncope and collapse: Secondary | ICD-10-CM | POA: Diagnosis not present

## 2016-04-30 DIAGNOSIS — C154 Malignant neoplasm of middle third of esophagus: Secondary | ICD-10-CM | POA: Diagnosis not present

## 2016-04-30 DIAGNOSIS — Z8501 Personal history of malignant neoplasm of esophagus: Secondary | ICD-10-CM | POA: Insufficient documentation

## 2016-04-30 DIAGNOSIS — Z79891 Long term (current) use of opiate analgesic: Secondary | ICD-10-CM | POA: Diagnosis not present

## 2016-04-30 DIAGNOSIS — Z923 Personal history of irradiation: Secondary | ICD-10-CM | POA: Insufficient documentation

## 2016-04-30 DIAGNOSIS — K219 Gastro-esophageal reflux disease without esophagitis: Secondary | ICD-10-CM | POA: Diagnosis not present

## 2016-04-30 DIAGNOSIS — Z510 Encounter for antineoplastic radiation therapy: Secondary | ICD-10-CM | POA: Insufficient documentation

## 2016-04-30 DIAGNOSIS — C7931 Secondary malignant neoplasm of brain: Secondary | ICD-10-CM | POA: Insufficient documentation

## 2016-04-30 DIAGNOSIS — Z79899 Other long term (current) drug therapy: Secondary | ICD-10-CM | POA: Insufficient documentation

## 2016-04-30 DIAGNOSIS — K56609 Unspecified intestinal obstruction, unspecified as to partial versus complete obstruction: Secondary | ICD-10-CM | POA: Diagnosis not present

## 2016-04-30 DIAGNOSIS — C801 Malignant (primary) neoplasm, unspecified: Secondary | ICD-10-CM | POA: Insufficient documentation

## 2016-04-30 HISTORY — DX: Dorsalgia, unspecified: M54.9

## 2016-04-30 HISTORY — DX: Other chronic pain: G89.29

## 2016-04-30 LAB — CBC WITH DIFFERENTIAL/PLATELET
Basophils Absolute: 0 10*3/uL (ref 0.0–0.1)
Basophils Relative: 0 %
Eosinophils Absolute: 0.1 10*3/uL (ref 0.0–0.7)
Eosinophils Relative: 1 %
HCT: 39.6 % (ref 39.0–52.0)
Hemoglobin: 13.9 g/dL (ref 13.0–17.0)
Lymphocytes Relative: 16 %
Lymphs Abs: 0.9 10*3/uL (ref 0.7–4.0)
MCH: 30 pg (ref 26.0–34.0)
MCHC: 35.1 g/dL (ref 30.0–36.0)
MCV: 85.3 fL (ref 78.0–100.0)
Monocytes Absolute: 0.4 10*3/uL (ref 0.1–1.0)
Monocytes Relative: 7 %
Neutro Abs: 4.3 10*3/uL (ref 1.7–7.7)
Neutrophils Relative %: 76 %
Platelets: 158 10*3/uL (ref 150–400)
RBC: 4.64 MIL/uL (ref 4.22–5.81)
RDW: 13 % (ref 11.5–15.5)
WBC: 5.7 10*3/uL (ref 4.0–10.5)

## 2016-04-30 LAB — URINALYSIS, ROUTINE W REFLEX MICROSCOPIC
Bilirubin Urine: NEGATIVE
Glucose, UA: NEGATIVE mg/dL
Hgb urine dipstick: NEGATIVE
Ketones, ur: NEGATIVE mg/dL
Leukocytes, UA: NEGATIVE
Nitrite: NEGATIVE
Protein, ur: NEGATIVE mg/dL
Specific Gravity, Urine: 1.017 (ref 1.005–1.030)
pH: 7 (ref 5.0–8.0)

## 2016-04-30 LAB — BASIC METABOLIC PANEL
Anion gap: 5 (ref 5–15)
BUN: 17 mg/dL (ref 6–20)
CO2: 29 mmol/L (ref 22–32)
Calcium: 9.3 mg/dL (ref 8.9–10.3)
Chloride: 105 mmol/L (ref 101–111)
Creatinine, Ser: 1.03 mg/dL (ref 0.61–1.24)
GFR calc Af Amer: >60 mL/min (ref >60)
GFR calc non Af Amer: >60 mL/min (ref >60)
Glucose, Bld: 115 mg/dL — ABNORMAL HIGH (ref 65–99)
Potassium: 3.7 mmol/L (ref 3.5–5.1)
Sodium: 139 mmol/L (ref 135–145)

## 2016-04-30 MED ORDER — SODIUM CHLORIDE 0.9% FLUSH
3.0000 mL | Freq: Two times a day (BID) | INTRAVENOUS | Status: DC
  Administered 2016-05-01: 3 mL via INTRAVENOUS

## 2016-04-30 MED ORDER — DEXAMETHASONE SODIUM PHOSPHATE 10 MG/ML IJ SOLN
10.0000 mg | Freq: Once | INTRAMUSCULAR | Status: AC
  Administered 2016-04-30: 10 mg via INTRAVENOUS
  Filled 2016-04-30: qty 1

## 2016-04-30 MED ORDER — ENOXAPARIN SODIUM 40 MG/0.4ML ~~LOC~~ SOLN
40.0000 mg | SUBCUTANEOUS | Status: DC
  Administered 2016-04-30: 40 mg via SUBCUTANEOUS
  Filled 2016-04-30: qty 0.4

## 2016-04-30 MED ORDER — BACLOFEN 10 MG PO TABS: 10.0000 mg | ORAL_TABLET | ORAL | Status: DC

## 2016-04-30 MED ORDER — MELOXICAM 15 MG PO TABS
15.0000 mg | ORAL_TABLET | ORAL | Status: DC
  Filled 2016-04-30: qty 1

## 2016-04-30 MED ORDER — MORPHINE SULFATE (PF) 2 MG/ML IV SOLN
2.0000 mg | INTRAVENOUS | Status: DC | PRN
  Administered 2016-04-30 – 2016-05-01 (×3): 2 mg via INTRAVENOUS
  Filled 2016-04-30 (×3): qty 1

## 2016-04-30 MED ORDER — DEXAMETHASONE 4 MG PO TABS: 4.0000 mg | ORAL_TABLET | Freq: Two times a day (BID) | ORAL | 0 refills | Status: DC

## 2016-04-30 MED ORDER — ACETAMINOPHEN 650 MG RE SUPP: 650.0000 mg | Freq: Four times a day (QID) | RECTAL | Status: DC | PRN

## 2016-04-30 MED ORDER — TRAMADOL HCL 50 MG PO TABS
50.0000 mg | ORAL_TABLET | Freq: Four times a day (QID) | ORAL | Status: DC | PRN
  Administered 2016-05-01: 50 mg via ORAL
  Filled 2016-04-30: qty 1

## 2016-04-30 MED ORDER — SODIUM CHLORIDE 0.9 % IV BOLUS (SEPSIS)
1000.0000 mL | Freq: Once | INTRAVENOUS | Status: AC
  Administered 2016-04-30: 1000 mL via INTRAVENOUS

## 2016-04-30 MED ORDER — ACETAMINOPHEN 325 MG PO TABS: 650.0000 mg | ORAL_TABLET | Freq: Four times a day (QID) | ORAL | Status: DC | PRN

## 2016-04-30 MED ORDER — GADOBENATE DIMEGLUMINE 529 MG/ML IV SOLN
20.0000 mL | Freq: Once | INTRAVENOUS | Status: AC | PRN
  Administered 2016-04-30: 20 mL via INTRAVENOUS

## 2016-04-30 MED ORDER — ONDANSETRON HCL 4 MG PO TABS: 4.0000 mg | ORAL_TABLET | Freq: Four times a day (QID) | ORAL | Status: DC | PRN

## 2016-04-30 MED ORDER — PANTOPRAZOLE SODIUM 40 MG PO TBEC
40.0000 mg | DELAYED_RELEASE_TABLET | Freq: Every day | ORAL | Status: DC
  Administered 2016-04-30 – 2016-05-01 (×2): 40 mg via ORAL
  Filled 2016-04-30 (×2): qty 1

## 2016-04-30 MED ORDER — ONDANSETRON HCL 4 MG/2ML IJ SOLN: 4.0000 mg | Freq: Four times a day (QID) | INTRAMUSCULAR | Status: DC | PRN

## 2016-04-30 MED ORDER — DICLOFENAC SODIUM 1 % TD GEL
4.0000 g | Freq: Four times a day (QID) | TRANSDERMAL | Status: DC | PRN
  Filled 2016-04-30: qty 100

## 2016-04-30 MED ORDER — SODIUM CHLORIDE 0.9 % IV SOLN: 250.0000 mL | INTRAVENOUS | Status: DC | PRN

## 2016-04-30 MED ORDER — SODIUM CHLORIDE 0.9% FLUSH
3.0000 mL | Freq: Two times a day (BID) | INTRAVENOUS | Status: DC
  Administered 2016-04-30 (×2): 3 mL via INTRAVENOUS

## 2016-04-30 MED ORDER — DEXAMETHASONE SODIUM PHOSPHATE 10 MG/ML IJ SOLN
10.0000 mg | Freq: Two times a day (BID) | INTRAMUSCULAR | Status: DC
  Administered 2016-04-30 – 2016-05-01 (×2): 10 mg via INTRAVENOUS
  Filled 2016-04-30 (×2): qty 1

## 2016-04-30 MED ORDER — LORAZEPAM 2 MG/ML IJ SOLN
1.5000 mg | Freq: Once | INTRAMUSCULAR | Status: AC
  Administered 2016-04-30: 1.5 mg via INTRAVENOUS
  Filled 2016-04-30: qty 1

## 2016-04-30 MED ORDER — SODIUM CHLORIDE 0.9% FLUSH: 3.0000 mL | INTRAVENOUS | Status: DC | PRN

## 2016-04-30 MED ORDER — HYDROCODONE-ACETAMINOPHEN 5-325 MG PO TABS
1.0000 | ORAL_TABLET | Freq: Three times a day (TID) | ORAL | Status: DC | PRN
  Administered 2016-04-30 – 2016-05-01 (×3): 1 via ORAL
  Filled 2016-04-30 (×3): qty 1

## 2016-04-30 NOTE — ED Notes (Signed)
RN starting line and collecting blood work

## 2016-04-30 NOTE — ED Notes (Signed)
Delay on pt vitals, pt is in MRI

## 2016-04-30 NOTE — H&P (Signed)
[facility] History and Physical  Billy Romero KGM:[id number] DOB: November 09, 1955 DOA: 04/30/2016  PCP: Lujean Amel, MD  Patient coming from: Home  Chief Complaint: Loss of consciousness  HPI: KALETH Romero is a 61 y.o. male with a medical history of esophageal cancer with metastasis, GERD, who presented to the emergency department with complaints of syncope and loss of consciousness today. Patient was in the shower when these events occurred. Patient states he began to feel very nauseated and hot. He became very lightheaded and passed out but cannot remember for how long. Patient states he's been having these symptoms in the past, while at a park, while in the shower and doing other activities. He normally has been "able to shake it off". Patient has been getting chemotherapy and follows up with Dr. Benay Spice.  Patient is also been complaining of double vision which is been ongoing for the past 3-4 days. He has felt some pressure and pain behind his right eye and complained of mild headache on the right side. Currently patient denies any chest pain, shortness of breath, abdominal pain, nausea or vomiting, diarrhea constipation. He denies any recent illness or travel.  ED Course: MRI of the brain with orbits conducted showing metastasis. [facility] called for admission.  Review of Systems:  All other systems reviewed and are negative.   Past Medical History:  Diagnosis Date  . Chronic back pain   . Complication of anesthesia    WOKE UP DURING COLONOSCOPY  . Esophageal cancer ([place]) 03/29/14  . GERD (gastroesophageal reflux disease)   . Primary cancer of esophagus with metastasis to other site [facility]) 07/04/2015    Past Surgical History:  Procedure Laterality Date  . APPENDECTOMY    . CERVICAL FUSION    . EYE SURGERY     PTEGERIUM   BIL EYES  . HAND SURGERY     DUPRAGENS CONTRACTURE  RT HAND  . KNEE ARTHROSCOPY     LEFT  . PEG PLACEMENT N/A 04/06/2014   Procedure: OPEN JEJUNOSTOMY TUBE  PLACEMENT;  Surgeon: Armandina Gemma, MD;  Location: [facility];  Service: General;  Laterality: N/A;  . VIDEO BRONCHOSCOPY WITH ENDOBRONCHIAL ULTRASOUND N/A 07/04/2015   Procedure: VIDEO BRONCHOSCOPY WITH ENDOBRONCHIAL ULTRASOUND with brushings and biopsies.;  Surgeon: Grace Isaac, MD;  Location: [place];  Service: Thoracic;  Laterality: N/A;    Social History:  reports that he has never smoked. He has never used smokeless tobacco. He reports that he does not drink alcohol or use drugs.  No Known Allergies  Family History  Problem Relation Age of Onset  . Cancer Father     Prior to Admission medications   Medication Sig Start Date End Date Taking? Authorizing Provider  baclofen (LIORESAL) 10 MG tablet Take 10 mg by mouth 3 (three) times a week.  01/12/15  Yes Historical Provider, MD  diclofenac sodium (VOLTAREN) 1 % GEL Apply 4 g topically 4 (four) times daily as needed (pain).  03/27/16  Yes Historical Provider, MD  esomeprazole (NEXIUM) 20 MG capsule Take 20 mg by mouth daily at 12 noon.   Yes Historical Provider, MD  HYDROcodone-acetaminophen (NORCO/VICODIN) 5-325 MG tablet Take 1 tablet by mouth 3 (three) times daily as needed for moderate pain. for pain 12/20/15  Yes Ladell Pier, MD  meloxicam (MOBIC) 15 MG tablet Take 15 mg by mouth 3 (three) times a week.  01/09/15  Yes Historical Provider, MD  traMADol (ULTRAM) 50 MG tablet Take 50 mg by mouth every 6 (  six) hours as needed. 07/11/15  Yes Historical Provider, MD  dexamethasone (DECADRON) 4 MG tablet Take 1 tablet (4 mg total) by mouth 2 (two) times daily with a meal. 04/30/16   Virgel Manifold, MD    Physical Exam: Vitals:   04/30/16 1341 04/30/16 1400  BP: 144/85 144/79  Pulse: 88 82  Resp: 15 20  Temp:       General: Well developed, well nourished, NAD, appears stated age  HEENT: NCAT, PERRLA, EOMI, Anicteic Sclera, mucous membranes moist. Right nasal bridge abrasion.  Neck: Supple, no JVD, no masses  Cardiovascular: S1 S2  auscultated, no rubs, murmurs or gallops. Regular rate and rhythm.  Respiratory: Clear to auscultation bilaterally with equal chest rise  Abdomen: Soft, nontender, nondistended, + bowel sounds  Extremities: warm dry without cyanosis clubbing or edema  Neuro: AAOx3,Mild proptosis of the right eye, patient complains of double blurry vision. Cannot abduct right eye. Otherwise, cranial nerves grossly intact. Strength 5/5 in patient's upper and lower extremities bilaterally  Skin: Without rashes exudates or nodules  Psych: Normal affect and demeanor with intact judgement and insight  Labs on Admission: I have personally reviewed following labs and imaging studies CBC:  Recent Labs Lab 04/30/16 0834  WBC 5.7  NEUTROABS 4.3  HGB 13.9  HCT 39.6  MCV 85.3  PLT 854   Basic Metabolic Panel:  Recent Labs Lab 04/30/16 0834  NA 139  K 3.7  CL 105  CO2 29  GLUCOSE 115*  BUN 17  CREATININE 1.03  CALCIUM 9.3   GFR: Estimated Creatinine Clearance: 85.7 mL/min (by C-G formula based on SCr of 1.03 mg/dL). Liver Function Tests: No results for input(s): AST, ALT, ALKPHOS, BILITOT, PROT, ALBUMIN in the last 168 hours. No results for input(s): LIPASE, AMYLASE in the last 168 hours. No results for input(s): AMMONIA in the last 168 hours. Coagulation Profile: No results for input(s): INR, PROTIME in the last 168 hours. Cardiac Enzymes: No results for input(s): CKTOTAL, CKMB, CKMBINDEX, TROPONINI in the last 168 hours. BNP (last 3 results) No results for input(s): PROBNP in the last 8760 hours. HbA1C: No results for input(s): HGBA1C in the last 72 hours. CBG: No results for input(s): GLUCAP in the last 168 hours. Lipid Profile: No results for input(s): CHOL, HDL, LDLCALC, TRIG, CHOLHDL, LDLDIRECT in the last 72 hours. Thyroid Function Tests: No results for input(s): TSH, T4TOTAL, FREET4, T3FREE, THYROIDAB in the last 72 hours. Anemia Panel: No results for input(s): VITAMINB12,  FOLATE, FERRITIN, TIBC, IRON, RETICCTPCT in the last 72 hours. Urine analysis:    Component Value Date/Time   COLORURINE YELLOW 04/30/2016 [place] 04/30/2016 1155   LABSPEC 1.017 04/30/2016 1155   PHURINE 7.0 04/30/2016 1155   GLUCOSEU NEGATIVE 04/30/2016 1155   HGBUR NEGATIVE 04/30/2016 [place] 04/30/2016 1155   KETONESUR NEGATIVE 04/30/2016 1155   PROTEINUR NEGATIVE 04/30/2016 1155   UROBILINOGEN 1.0 04/11/2014 1830   NITRITE NEGATIVE 04/30/2016 1155   LEUKOCYTESUR NEGATIVE 04/30/2016 1155   Sepsis Labs: '@LABRCNTIP'$ (procalcitonin:4,lacticidven:4) )No results found for this or any previous visit (from the past 240 hour(s)).   Radiological Exams on Admission: Mr Jeri Cos OE Contrast  Result Date: 04/30/2016 CLINICAL DATA:  Multiple near syncopal episodes associated with taking hot shower. Horizontal diplopia. Pain and pressure behind the right eye. Esophageal cancer with known metastatic disease. EXAM: MRI HEAD AND ORBITS WITHOUT AND WITH CONTRAST TECHNIQUE: Multiplanar, multiecho pulse sequences of the brain and surrounding structures were obtained without  and with intravenous contrast. Multiplanar, multiecho pulse sequences of the orbits and surrounding structures were obtained including fat saturation techniques, before and after intravenous contrast administration. CONTRAST:  18m MULTIHANCE GADOBENATE DIMEGLUMINE 529 MG/ML IV SOLN COMPARISON:  Whole-body PET scan 06/16/2015. CT of the chest 04/25/2016. FINDINGS: MRI HEAD FINDINGS Brain: No acute infarct, hemorrhage, or mass lesion is present within the brain parenchyma. A developmental venous anomaly is present in the right cerebellum. Diffuse meningeal enhancement is present over both convexities, slightly more prominent on the right. No other focal parenchymal enhancement is present. Vascular: A metastatic lesion appears to displace the superior sagittal sinus without occluding it. The dural sinuses  are otherwise patent. Flow is present in the major intracranial arteries. Skull and upper cervical spine: Multifocal areas of enhancing osseous metastases are present. A large enhancing mass infiltrates the greater sphenoid wing on the right. This extends into the orbital roofs with mass effect on the superior and lateral rectus muscles. Enhancing tumor extends posteriorly to the cavernous sinus, potentially along the right third cranial nerve. There is slight bowing of the right cavernous sinus and enhancement compatible with metastatic disease within the right cavernous sinus. There is also some narrowing of the right optic canal. There is no definite abnormal signal or enhancement along the right optic nerve. The optic chiasm is intact. An additional metastasis is present in the vertex measuring 4.9 x 1.9 x 1.7 cm. This extends through both the inner and outer table of the calvarium and into the scalp. A focal metastasis in the high anterior frontal calvarium measures 14 x 6 x 10 mm. A focus in the right parietal skull measures 2.5 x 0.8 x 1.5 cm. The craniocervical junction is within normal limits. MRI ORBITS FINDINGS Orbits: Dedicated imaging of the orbits demonstrates encroachment of the right sphenoid metastasis S into the right orbit. There is mass effect on the superior and lateral rectus muscles. There is narrowing of the optic canal. Pathologic enhancement extends along the third cranial nerve course to the cavernous sinus. There is enhancing tumor in the right cavernous sinus. No definite enhancement is evident along the right optic nerve. The left orbit is unremarkable. There is mild proptosis on the right. Visualized sinuses: The paranasal sinuses are clear. Mastoid air cells are clear. Soft tissues: The soft tissues the face are otherwise unremarkable. IMPRESSION: 1. Multifocal areas of cranial enhancement compatible with osseous metastases, likely from known esophageal cancer. 2. Prominent metastasis  along the greater wing of the right sphenoid with encroachment into the right orbit, mass effect on the lateral and superior semicircular canals, and right-sided proptosis. 3. Enhancement extends posteriorly into the right cavernous sinus, potentially along the right third cranial nerve. 4. Narrowing of the right optic canal. 5. Large metastasis along the vertex extending through the calvarium and into the scalp with inferior displacement of the superior sagittal sinus but no definite sinus obstruction. 6. Additional smaller osseous metastasis within the high anterior right frontal skull and posterior left parietal skull. 7. No focal brain metastases. 8. 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 Electronically Signed   By: CSan MorelleM.D.   On: 04/30/2016 10:55   Mr ORosealee AlbeeWATContrast  Result Date: 04/30/2016 CLINICAL DATA:  Multiple near syncopal episodes associated with taking hot shower. Horizontal diplopia. Pain and pressure behind the right eye. Esophageal cancer with known metastatic disease. EXAM: MRI HEAD AND ORBITS WITHOUT AND WITH CONTRAST TECHNIQUE: Multiplanar, multiecho pulse sequences of the brain and surrounding structures  were obtained without and with intravenous contrast. Multiplanar, multiecho pulse sequences of the orbits and surrounding structures were obtained including fat saturation techniques, before and after intravenous contrast administration. CONTRAST:  71m MULTIHANCE GADOBENATE DIMEGLUMINE 529 MG/ML IV SOLN COMPARISON:  Whole-body PET scan 06/16/2015. CT of the chest 04/25/2016. FINDINGS: MRI HEAD FINDINGS Brain: No acute infarct, hemorrhage, or mass lesion is present within the brain parenchyma. A developmental venous anomaly is present in the right cerebellum. Diffuse meningeal enhancement is present over both convexities, slightly more prominent on the right. No other focal parenchymal enhancement is present. Vascular: A metastatic lesion appears to  displace the superior sagittal sinus without occluding it. The dural sinuses are otherwise patent. Flow is present in the major intracranial arteries. Skull and upper cervical spine: Multifocal areas of enhancing osseous metastases are present. A large enhancing mass infiltrates the greater sphenoid wing on the right. This extends into the orbital roofs with mass effect on the superior and lateral rectus muscles. Enhancing tumor extends posteriorly to the cavernous sinus, potentially along the right third cranial nerve. There is slight bowing of the right cavernous sinus and enhancement compatible with metastatic disease within the right cavernous sinus. There is also some narrowing of the right optic canal. There is no definite abnormal signal or enhancement along the right optic nerve. The optic chiasm is intact. An additional metastasis is present in the vertex measuring 4.9 x 1.9 x 1.7 cm. This extends through both the inner and outer table of the calvarium and into the scalp. A focal metastasis in the high anterior frontal calvarium measures 14 x 6 x 10 mm. A focus in the right parietal skull measures 2.5 x 0.8 x 1.5 cm. The craniocervical junction is within normal limits. MRI ORBITS FINDINGS Orbits: Dedicated imaging of the orbits demonstrates encroachment of the right sphenoid metastasis S into the right orbit. There is mass effect on the superior and lateral rectus muscles. There is narrowing of the optic canal. Pathologic enhancement extends along the third cranial nerve course to the cavernous sinus. There is enhancing tumor in the right cavernous sinus. No definite enhancement is evident along the right optic nerve. The left orbit is unremarkable. There is mild proptosis on the right. Visualized sinuses: The paranasal sinuses are clear. Mastoid air cells are clear. Soft tissues: The soft tissues the face are otherwise unremarkable. IMPRESSION: 1. Multifocal areas of cranial enhancement compatible with  osseous metastases, likely from known esophageal cancer. 2. Prominent metastasis along the greater wing of the right sphenoid with encroachment into the right orbit, mass effect on the lateral and superior semicircular canals, and right-sided proptosis. 3. Enhancement extends posteriorly into the right cavernous sinus, potentially along the right third cranial nerve. 4. Narrowing of the right optic canal. 5. Large metastasis along the vertex extending through the calvarium and into the scalp with inferior displacement of the superior sagittal sinus but no definite sinus obstruction. 6. Additional smaller osseous metastasis within the high anterior right frontal skull and posterior left parietal skull. 7. No focal brain metastases. 8. 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 Electronically Signed   By: CSan MorelleM.D.   On: 04/30/2016 10:55    EKG: Independently reviewed. Sinus rhythm, rate 70  Assessment/Plan  Syncope with loss of consciousness -Suspect secondary to metastasis -MRI brain/orbits with and without contrast showed multifocal areas of cranial enhancement compatible with all sees metastasis likely from known esophageal cancer. Prominent metastasis along the greater wing of the right sphenoid with  encroachment into the right orbital, mass effect of the lateral and superior semicircle canals, right-sided proptosis. 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 Benay Spice, made aware of patient's admission.  -Have placed patient on Decadron 10 mg IV twice a day -Radiation oncology consulted -Continue pain control  GERD -Continue PPI  DVT prophylaxis: Lovenox  Code Status: Full  Family Communication: Wife at bedside. Admission, patients condition and plan of care including tests being ordered have been discussed with the patient and wife who  indicate understanding and agree with the plan and Code Status.  Disposition Plan: home   Consults called: Oncology, Radiation Oncology   Admission status: Obsevation   Time spent: 60 minutes  Ellijah Leffel D.O. [facility] Pager [phone]  If 7PM-7AM, please contact night-coverage [email or web address] Password [email or web address] 04/30/2016, 2:35 PM

## 2016-04-30 NOTE — ED Notes (Signed)
Pt is aware that a urine sample is needed, pt has a urinal at the bedside but is unable to collect one at this time.

## 2016-04-30 NOTE — ED Notes (Signed)
Bed: WA17 Expected date:  Expected time:  Means of arrival:  Comments: EMS 61 yo, syncope

## 2016-04-30 NOTE — Progress Notes (Deleted)
  Billy Romero OFFICE PROGRESS NOTE   Diagnosis: Esophagus cancer  INTERVAL HISTORY:   Billy Romero returns as scheduled. He completed another treatment with pembrolizumab on 04/10/2016.   Objective:  Vital signs in last 24 hours:  There were no vitals taken for this visit.    HEENT: *** Lymphatics: *** Resp: *** Cardio: *** GI: *** Vascular: *** Neuro:***  Skin:***   Portacath/PICC-without erythema  Lab Results:  Lab Results  Component Value Date   WBC 4.1 03/20/2016   HGB 13.9 03/20/2016   HCT 40.2 03/20/2016   MCV 86.1 03/20/2016   PLT 155 03/20/2016   NEUTROABS 2.7 03/20/2016    Lab Results  Component Value Date   NA 140 03/20/2016    No results found for: CEA1  Imaging:  No results found.  Medications: I have reviewed the patient's current medications.  Assessment/Plan: 1. 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 Radiation completed 06/10/2014  PET scan 07/01/2014 revealed improvement in the hypermetabolic esophagus mass and mediastinal lymphadenopathy  upper endoscopy 04/20/2015 -mid esophagus with mild narrowing , 2 small nodules measuring approximate 5 mm -biopsy with scant and detached fragments with high-grade squamous dysplasia , brushings negative for malignant cells.  CT chest/abdomen/pelvis 05/25/2015 with development of bilateral pulmonary nodules; paratracheal and subcarinal nodes improved/similar; new small lower paraesophageal nodes which are indeterminate; esophageal wall thickening improved since the prior PET; no evidence of metastatic disease in the abdomen or pelvis.  PET scan 06/16/2015  revealed hypermetabolic lung nodules, hypermetabolic mediastinal nodes, hypermetabolism at the mid to distal esophagus has improved  bronchoscopy/EBUS on 07/04/2015 confirmed squamous cell carcinoma involving right lower lobe brushings, right bronchial washings, a level 10 R lymph node biopsy, and right lower lobe biopsy  PD-L1 no expression on the right lower lobe biopsy (less than 1%)  Cycle 1 Pembrolizumab 07/26/2015  Cycle 2 Pembrolizumab 08/16/2015  Cycle 3 Pembrolizumab 09/06/2015  Cycle 4 Pembrolizumab 09/27/2015  CT chest 10/11/2015-stable/decreased size of pulmonary nodules, enlargement of several mediastinal nodes  Cycle 5 Pembrolizumab 10/18/2015  Cycle 6 Pembrolizumab 11/08/2015   Cycle 7 Pembrolizumab08/05/2015  Cycle 8 Pembrolizumab08/23/2017  CT chest 01/15/2016-slight progression of mediastinal/upper abdominal lymphadenopathy and enlargement of a right lower lobe nodule  Cycle 9 Pembrolizumab 01/17/2016   Cycle 10 Pembrolizumab10/02/2016  Cycle 11 Pembrolizumab 02/28/2016  Cycle 12 Pembrolizumab 03/20/2016  2. Solid/liquid dysphagia secondary to #1  Jejunostomy feeding tube placement 04/07/2014  Feeding tube removed 05/30/2014.  3. Small bowel obstruction following placement of the jejunostomy feeding tube-resolved  4. History of ulcerative colitis  5. Aspiration pneumonia December 2015  ***  Disposition:  Betsy Coder, MD  04/30/2016  8:20 AM

## 2016-04-30 NOTE — ED Notes (Signed)
Patient transported to MRI 

## 2016-04-30 NOTE — Progress Notes (Signed)
IP PROGRESS NOTE  Subjective:   Billy Romero was scheduled for treatment at the [facility] earlier today. He had a syncope event in the shower and presented to the emergency room for further evaluation. He reports diplopia for several days. He initially related this to having a "basal cell carcinoma "removed from the right side of his nose. He reports dysphagia for several months that has progressed recently. He has regurgitated food on a few occasions. A restaging chest CT 04/25/2016 revealed mild progression of mediastinal and hilar adenopathy in addition to mild enlargement of pulmonary nodules. Circumferential wall thickening was noted in the mid thoracic esophagus.  Objective: Vital signs in last 24 hours: Blood pressure 137/76, pulse 84, temperature 98.8 F (37.1 C), temperature source Oral, resp. rate 20, height 5' 11"  (1.803 m), weight 198 lb 4.8 oz (89.9 kg), SpO2 99 %.  Intake/Output from previous day: No intake/output data recorded.  Physical Exam:  HEENT: No thrush, no palpable mass at the skull Lungs: Clear bilaterally Cardiac: Regular rate and rhythm Neurologic: Alert and oriented, the right eye deviates medially   Lab Results:  Recent Labs  04/30/16 0834  WBC 5.7  HGB 13.9  HCT 39.6  PLT 158    BMET  Recent Labs  04/30/16 0834  NA 139  K 3.7  CL 105  CO2 29  GLUCOSE 115*  BUN 17  CREATININE 1.03  CALCIUM 9.3    Studies/Results: Billy Romero Wo Contrast  Result Date: 04/30/2016 CLINICAL DATA:  Multiple near syncopal episodes associated with taking hot shower. Horizontal diplopia. Pain and pressure behind the right eye. Esophageal cancer with known metastatic disease. EXAM: MRI HEAD AND ORBITS WITHOUT AND WITH CONTRAST TECHNIQUE: Multiplanar, multiecho pulse sequences of the brain and surrounding structures were obtained without and with intravenous contrast. Multiplanar, multiecho pulse sequences of the orbits and surrounding structures were obtained  including fat saturation techniques, before and after intravenous contrast administration. CONTRAST:  31m MULTIHANCE GADOBENATE DIMEGLUMINE 529 MG/ML IV SOLN COMPARISON:  Whole-body PET scan 06/16/2015. CT of the chest 04/25/2016. FINDINGS: MRI HEAD FINDINGS Brain: No acute infarct, hemorrhage, or mass lesion is present within the brain parenchyma. A developmental venous anomaly is present in the right cerebellum. Diffuse meningeal enhancement is present over both convexities, slightly more prominent on the right. No other focal parenchymal enhancement is present. Vascular: A metastatic lesion appears to displace the superior sagittal sinus without occluding it. The dural sinuses are otherwise patent. Flow is present in the major intracranial arteries. Skull and upper cervical spine: Multifocal areas of enhancing osseous metastases are present. A large enhancing mass infiltrates the greater sphenoid wing on the right. This extends into the orbital roofs with mass effect on the superior and lateral rectus muscles. Enhancing tumor extends posteriorly to the cavernous sinus, potentially along the right third cranial nerve. There is slight bowing of the right cavernous sinus and enhancement compatible with metastatic disease within the right cavernous sinus. There is also some narrowing of the right optic canal. There is no definite abnormal signal or enhancement along the right optic nerve. The optic chiasm is intact. An additional metastasis is present in the vertex measuring 4.9 x 1.9 x 1.7 cm. This extends through both the inner and outer table of the calvarium and into the scalp. A focal metastasis in the high anterior frontal calvarium measures 14 x 6 x 10 mm. A focus in the right parietal skull measures 2.5 x 0.8 x 1.5 cm. The craniocervical junction is  within normal limits. MRI ORBITS FINDINGS Orbits: Dedicated imaging of the orbits demonstrates encroachment of the right sphenoid metastasis S into the right  orbit. There is mass effect on the superior and lateral rectus muscles. There is narrowing of the optic canal. Pathologic enhancement extends along the third cranial nerve course to the cavernous sinus. There is enhancing tumor in the right cavernous sinus. No definite enhancement is evident along the right optic nerve. The left orbit is unremarkable. There is mild proptosis on the right. Visualized sinuses: The paranasal sinuses are clear. Mastoid air cells are clear. Soft tissues: The soft tissues the face are otherwise unremarkable. IMPRESSION: 1. Multifocal areas of cranial enhancement compatible with osseous metastases, likely from known esophageal cancer. 2. Prominent metastasis along the greater wing of the right sphenoid with encroachment into the right orbit, mass effect on the lateral and superior semicircular canals, and right-sided proptosis. 3. Enhancement extends posteriorly into the right cavernous sinus, potentially along the right third cranial nerve. 4. Narrowing of the right optic canal. 5. Large metastasis along the vertex extending through the calvarium and into the scalp with inferior displacement of the superior sagittal sinus but no definite sinus obstruction. 6. Additional smaller osseous metastasis within the high anterior right frontal skull and posterior left parietal skull. 7. No focal brain metastases. 8. 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 Electronically Signed   By: San Morelle M.D.   On: 04/30/2016 10:55   Billy Romero QR Contrast  Result Date: 04/30/2016 CLINICAL DATA:  Multiple near syncopal episodes associated with taking hot shower. Horizontal diplopia. Pain and pressure behind the right eye. Esophageal cancer with known metastatic disease. EXAM: MRI HEAD AND ORBITS WITHOUT AND WITH CONTRAST TECHNIQUE: Multiplanar, multiecho pulse sequences of the brain and surrounding structures were obtained without and with intravenous contrast. Multiplanar,  multiecho pulse sequences of the orbits and surrounding structures were obtained including fat saturation techniques, before and after intravenous contrast administration. CONTRAST:  14m MULTIHANCE GADOBENATE DIMEGLUMINE 529 MG/ML IV SOLN COMPARISON:  Whole-body PET scan 06/16/2015. CT of the chest 04/25/2016. FINDINGS: MRI HEAD FINDINGS Brain: No acute infarct, hemorrhage, or mass lesion is present within the brain parenchyma. A developmental venous anomaly is present in the right cerebellum. Diffuse meningeal enhancement is present over both convexities, slightly more prominent on the right. No other focal parenchymal enhancement is present. Vascular: A metastatic lesion appears to displace the superior sagittal sinus without occluding it. The dural sinuses are otherwise patent. Flow is present in the major intracranial arteries. Skull and upper cervical spine: Multifocal areas of enhancing osseous metastases are present. A large enhancing mass infiltrates the greater sphenoid wing on the right. This extends into the orbital roofs with mass effect on the superior and lateral rectus muscles. Enhancing tumor extends posteriorly to the cavernous sinus, potentially along the right third cranial nerve. There is slight bowing of the right cavernous sinus and enhancement compatible with metastatic disease within the right cavernous sinus. There is also some narrowing of the right optic canal. There is no definite abnormal signal or enhancement along the right optic nerve. The optic chiasm is intact. An additional metastasis is present in the vertex measuring 4.9 x 1.9 x 1.7 cm. This extends through both the inner and outer table of the calvarium and into the scalp. A focal metastasis in the high anterior frontal calvarium measures 14 x 6 x 10 mm. A focus in the right parietal skull measures 2.5 x 0.8 x 1.5 cm. The  craniocervical junction is within normal limits. MRI ORBITS FINDINGS Orbits: Dedicated imaging of the  orbits demonstrates encroachment of the right sphenoid metastasis S into the right orbit. There is mass effect on the superior and lateral rectus muscles. There is narrowing of the optic canal. Pathologic enhancement extends along the third cranial nerve course to the cavernous sinus. There is enhancing tumor in the right cavernous sinus. No definite enhancement is evident along the right optic nerve. The left orbit is unremarkable. There is mild proptosis on the right. Visualized sinuses: The paranasal sinuses are clear. Mastoid air cells are clear. Soft tissues: The soft tissues the face are otherwise unremarkable. IMPRESSION: 1. Multifocal areas of cranial enhancement compatible with osseous metastases, likely from known esophageal cancer. 2. Prominent metastasis along the greater wing of the right sphenoid with encroachment into the right orbit, mass effect on the lateral and superior semicircular canals, and right-sided proptosis. 3. Enhancement extends posteriorly into the right cavernous sinus, potentially along the right third cranial nerve. 4. Narrowing of the right optic canal. 5. Large metastasis along the vertex extending through the calvarium and into the scalp with inferior displacement of the superior sagittal sinus but no definite sinus obstruction. 6. Additional smaller osseous metastasis within the high anterior right frontal skull and posterior left parietal skull. 7. No focal brain metastases. 8. 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 Electronically Signed   By: San Morelle M.D.   On: 04/30/2016 10:55    Medications: I have reviewed the patient's current medications.  Assessment/Plan:  1. 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 Radiation completed 06/10/2014  PET scan 07/01/2014 revealed improvement in the hypermetabolic esophagus mass and mediastinal lymphadenopathy  upper endoscopy 04/20/2015 -mid esophagus with mild narrowing , 2 small nodules measuring approximate 5 mm -biopsy with scant and detached fragments with high-grade squamous dysplasia , brushings negative for malignant cells.  CT chest/abdomen/pelvis 05/25/2015 with development of bilateral pulmonary nodules; paratracheal and subcarinal nodes improved/similar; new small lower paraesophageal nodes which are indeterminate; esophageal wall thickening improved since the prior PET; no evidence of metastatic disease in the abdomen or pelvis.  PET scan 06/16/2015 revealed hypermetabolic lung nodules, hypermetabolic mediastinal nodes, hypermetabolism at the mid to distal esophagus has improved  bronchoscopy/EBUS on 07/04/2015 confirmed squamous cell carcinoma involving right lower lobe brushings, right bronchial washings, a level 10 R lymph node biopsy, and right lower lobe biopsy  PD-L1 no expression on the right lower lobe biopsy (less than 1%)  Cycle 1 Pembrolizumab 07/26/2015  Cycle 2 Pembrolizumab 08/16/2015  Cycle 3 Pembrolizumab 09/06/2015  Cycle 4 Pembrolizumab 09/27/2015  CT chest 10/11/2015-stable/decreased size of pulmonary nodules, enlargement of several mediastinal nodes  Cycle 5 Pembrolizumab 10/18/2015  Cycle 6 Pembrolizumab 11/08/2015   Cycle 7 Pembrolizumab08/05/2015  Cycle 8 Pembrolizumab08/23/2017  CT chest 01/15/2016-slight progression of mediastinal/upper abdominal lymphadenopathy and enlargement of a right lower lobe nodule  Cycle 9 Pembrolizumab 01/17/2016   Cycle 10 Pembrolizumab10/02/2016  Cycle 11 Pembrolizumab 02/28/2016  Cycle 12 Pembrolizumab 03/20/2016  CT chest 04/25/2016-minimal progression of chest  lymphadenopathy and lung nodules, thickening at the mid thoracic esophagus  MRI brain 04/30/2016-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 with involvement of the right orbit, no brain metastases  2. Solid/liquid dysphagia  secondary to #1  Jejunostomy feeding tube placement 04/07/2014  Feeding tube removed 05/30/2014.  3. Small bowel obstruction following placement of the jejunostomy feeding tube-resolved  4. History of ulcerative colitis  5. Aspiration pneumonia December 2015  6. 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 I discussed the CT and MRI findings with Billy Romero and his wife. He has developed disease progression while on pembrolizumab. This will be discontinued. It is very likely the skull metastases are related to esophagus cancer as opposed to a different primary tumor site. I consulted Dr. Lisbeth Renshaw to consider palliative radiation to the right sphenoid lesion. He was started on palliative Decadron today.  We will consider systemic treatment options at the completion of radiation, likely to include salvage chemotherapy.  Billy Romero has progressive dysphagia, potentially secondary to local progression of tumor in the esophagus. I will order a barium swallow for tomorrow.  Recommendations: 1. Decadron/palliative radiation to the right sphenoid mass 2. Barium swallow 3. Outpatient follow-up to be scheduled at the [facility]   LOS: 0 days   Betsy Coder, MD   04/30/2016, 5:23 PM

## 2016-04-30 NOTE — ED Triage Notes (Signed)
Per EMS- patient had a syncopal episode in the shower. Patient reported that he is currently receiving chemo and was due for a treatment today. Patient c/o intermittent blurred vision and weakness x 3-4 days. Patient also c/o neck pain, but has a history of chronic neck pain.

## 2016-04-30 NOTE — ED Provider Notes (Signed)
[facility] DEPT Provider Note   CSN: [id number] Arrival date & time: 04/30/16  0756     History   Chief Complaint Chief Complaint  Patient presents with  . Loss of Consciousness  . Weakness    HPI Billy Romero is a 61 y.o. male.  HPI  61 year old male with syncopal episode. Earlier this morning he was taken hot shower when he began to feel nauseated and very hot. He then became lightheaded and passed out briefly. He has had these symptoms previously while showering. He has never actually passed out previously though. Denies having a chest pain or dyspnea. Currently feeling better.  Additionally, he is complaining of horizontal diplopia. His had this for the past 3-4 days. It is associated with some pain and pressure behind his right eye and in the right temporal region. He recently had some type of skin lesion removed from near the right nasal bridge. After this he had some mild irritation and redness of his right eye which has since resolved. Is concerned that his eyes inspected to be related to this.  Past Medical History:  Diagnosis Date  . Chronic back pain   . Complication of anesthesia    WOKE UP DURING COLONOSCOPY  . Esophageal cancer ([place]) 03/29/14  . GERD (gastroesophageal reflux disease)   . 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 Nausea with vomiting   . Ileus, postoperative ([place]) 04/15/2014  . SBO (small bowel obstruction)   . Esophageal carcinoma ([place]) 04/06/2014  . Chronic back pain 04/05/2014  . Dysphagia, pharyngoesophageal phase 04/05/2014  . Protein-calorie malnutrition, severe ([place]) 04/05/2014  . Dysphagia 04/05/2014  . Malnutrition of moderate degree ([place]) 04/05/2014  . Esophagus cancer ([place]) 04/04/2014     Past Surgical History:  Procedure Laterality Date  . APPENDECTOMY    . CERVICAL FUSION    . EYE SURGERY     PTEGERIUM   BIL EYES  . HAND SURGERY     DUPRAGENS CONTRACTURE  RT HAND  . KNEE ARTHROSCOPY     LEFT  . PEG PLACEMENT N/A 04/06/2014   Procedure: OPEN JEJUNOSTOMY TUBE PLACEMENT;  Surgeon: Armandina Gemma, MD;  Location: [facility] ORS;  Service: General;  Laterality: N/A;  . VIDEO BRONCHOSCOPY WITH ENDOBRONCHIAL ULTRASOUND N/A 07/04/2015   Procedure: VIDEO BRONCHOSCOPY WITH ENDOBRONCHIAL ULTRASOUND with brushings and biopsies.;  Surgeon: Grace Isaac, MD;  Location: [facility] OR;  Service: Thoracic;  Laterality: N/A;       Home Medications    Prior to Admission medications   Medication Sig Start Date End Date Taking? Authorizing Provider  baclofen (LIORESAL) 10 MG tablet Take 10 mg by mouth 3 (three) times a week.  01/12/15  Yes Historical Provider, MD  diclofenac sodium (VOLTAREN) 1 % GEL Apply 4 g topically 4 (four) times daily as needed (pain).  03/27/16  Yes Historical Provider, MD  esomeprazole (NEXIUM) 20 MG capsule Take 20 mg by mouth daily at 12 noon.   Yes Historical Provider, MD  HYDROcodone-acetaminophen (NORCO/VICODIN) 5-325 MG tablet Take 1 tablet by mouth 3 (three) times daily as needed for moderate pain. for pain 12/20/15  Yes Ladell Pier, MD  meloxicam (MOBIC) 15 MG tablet Take 15 mg by mouth 3 (three) times a week.  01/09/15  Yes Historical Provider, MD  traMADol (ULTRAM) 50 MG tablet Take 50 mg by mouth every 6 (six) hours as needed. 07/11/15  Yes Historical Provider, MD    Family History Family History  Problem Relation Age of Onset  . Cancer Father     Social History Social History  Substance Use Topics  . Smoking status: Never Smoker  . Smokeless tobacco: Never Used  . Alcohol use No     Allergies   Patient has no known allergies.   Review of Systems Review of Systems  All systems reviewed and negative, other than as noted in HPI.   Physical  Exam Updated Vital Signs BP 117/79   Pulse 72   Temp 98.4 F (36.9 C)   Resp 13   Ht '5\' 11"'$  (1.803 m)   Wt 200 lb (90.7 kg)   SpO2 97%   BMI 27.89 kg/m   Physical Exam  Constitutional: He appears well-developed and well-nourished. No distress.  HENT:  Head: Normocephalic and atraumatic.  Site of recent skin excision R bridge of nose appears to be healing well.   Eyes: Conjunctivae are normal. Right eye exhibits no discharge. Left eye exhibits no discharge.  Neck: Neck supple.  Cardiovascular: Normal rate, regular rhythm and normal heart sounds.  Exam reveals no gallop and no friction rub.   No murmur heard. Pulmonary/Chest: Effort normal and breath sounds normal. No respiratory distress.  Abdominal: Soft. He exhibits no distension. There is no tenderness.  Musculoskeletal: He exhibits no edema or tenderness.  Neurological: He is alert.  Mild proptosis R eye. Slightly disconjugate gaze. Cannot fully abduct R eye. EOM function/CN exam otherwise normal. Strength 5/5 b/l u/l ext.   Skin: Skin is warm and dry.  Psychiatric: He has a normal mood and affect. His behavior is normal. Thought content normal.  Nursing note and vitals reviewed.    ED Treatments / Results  Labs (all labs ordered are listed, but only abnormal results are displayed) Labs Reviewed  BASIC METABOLIC PANEL - Abnormal; Notable for the following:       Result Value   Glucose, Bld 115 (*)    All other components within normal limits  CBC WITH DIFFERENTIAL/PLATELET  URINALYSIS, ROUTINE W REFLEX MICROSCOPIC    EKG  EKG Interpretation  Date/Time:  Tuesday April 30 2016 08:55:38 EST Ventricular Rate:  70 PR Interval:    QRS Duration: 91 QT Interval:  395 QTC Calculation: 427 R Axis:   4 Text Interpretation:  Sinus rhythm Abnormal R-wave progression, early transition Confirmed by Wilson Singer  MD, Annie Main ([id number]) on 04/30/2016 9:28:42 AM Also confirmed by Wilson Singer  MD, [place] [phone]), editor [place] CT, Leda Gauze  [phone])  on 04/30/2016 10:13:35 AM       Radiology Mr Brain W Wo Contrast  Result Date: 04/30/2016 CLINICAL DATA:  Multiple near syncopal episodes associated with taking hot shower. Horizontal diplopia. Pain and pressure behind the right eye. Esophageal cancer with known metastatic disease. EXAM: MRI HEAD AND ORBITS WITHOUT AND WITH CONTRAST TECHNIQUE: Multiplanar, multiecho pulse sequences of the brain and surrounding structures were obtained without and with intravenous contrast. Multiplanar, multiecho pulse sequences of the orbits and surrounding structures were obtained including fat saturation techniques, before and after intravenous contrast administration. CONTRAST:  47m MULTIHANCE GADOBENATE DIMEGLUMINE 529 MG/ML IV SOLN COMPARISON:  Whole-body PET scan 06/16/2015. CT of the chest 04/25/2016. FINDINGS: MRI HEAD FINDINGS Brain: No acute infarct, hemorrhage, or mass lesion is present within the brain parenchyma. A developmental venous anomaly is  present in the right cerebellum. Diffuse meningeal enhancement is present over both convexities, slightly more prominent on the right. No other focal parenchymal enhancement is present. Vascular: A metastatic lesion appears to displace the superior sagittal sinus without occluding it. The dural sinuses are otherwise patent. Flow is present in the major intracranial arteries. Skull and upper cervical spine: Multifocal areas of enhancing osseous metastases are present. A large enhancing mass infiltrates the greater sphenoid wing on the right. This extends into the orbital roofs with mass effect on the superior and lateral rectus muscles. Enhancing tumor extends posteriorly to the cavernous sinus, potentially along the right third cranial nerve. There is slight bowing of the right cavernous sinus and enhancement compatible with metastatic disease within the right cavernous sinus. There is also some narrowing of the right optic canal. There is no definite abnormal  signal or enhancement along the right optic nerve. The optic chiasm is intact. An additional metastasis is present in the vertex measuring 4.9 x 1.9 x 1.7 cm. This extends through both the inner and outer table of the calvarium and into the scalp. A focal metastasis in the high anterior frontal calvarium measures 14 x 6 x 10 mm. A focus in the right parietal skull measures 2.5 x 0.8 x 1.5 cm. The craniocervical junction is within normal limits. MRI ORBITS FINDINGS Orbits: Dedicated imaging of the orbits demonstrates encroachment of the right sphenoid metastasis S into the right orbit. There is mass effect on the superior and lateral rectus muscles. There is narrowing of the optic canal. Pathologic enhancement extends along the third cranial nerve course to the cavernous sinus. There is enhancing tumor in the right cavernous sinus. No definite enhancement is evident along the right optic nerve. The left orbit is unremarkable. There is mild proptosis on the right. Visualized sinuses: The paranasal sinuses are clear. Mastoid air cells are clear. Soft tissues: The soft tissues the face are otherwise unremarkable. IMPRESSION: 1. Multifocal areas of cranial enhancement compatible with osseous metastases, likely from known esophageal cancer. 2. Prominent metastasis along the greater wing of the right sphenoid with encroachment into the right orbit, mass effect on the lateral and superior semicircular canals, and right-sided proptosis. 3. Enhancement extends posteriorly into the right cavernous sinus, potentially along the right third cranial nerve. 4. Narrowing of the right optic canal. 5. Large metastasis along the vertex extending through the calvarium and into the scalp with inferior displacement of the superior sagittal sinus but no definite sinus obstruction. 6. Additional smaller osseous metastasis within the high anterior right frontal skull and posterior left parietal skull. 7. No focal brain metastases. 8.  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 Electronically Signed   By: San Morelle M.D.   On: 04/30/2016 10:55   Mr Rosealee Albee QQ Contrast  Result Date: 04/30/2016 CLINICAL DATA:  Multiple near syncopal episodes associated with taking hot shower. Horizontal diplopia. Pain and pressure behind the right eye. Esophageal cancer with known metastatic disease. EXAM: MRI HEAD AND ORBITS WITHOUT AND WITH CONTRAST TECHNIQUE: Multiplanar, multiecho pulse sequences of the brain and surrounding structures were obtained without and with intravenous contrast. Multiplanar, multiecho pulse sequences of the orbits and surrounding structures were obtained including fat saturation techniques, before and after intravenous contrast administration. CONTRAST:  33m MULTIHANCE GADOBENATE DIMEGLUMINE 529 MG/ML IV SOLN COMPARISON:  Whole-body PET scan 06/16/2015. CT of the chest 04/25/2016. FINDINGS: MRI HEAD FINDINGS Brain: No acute infarct, hemorrhage, or mass lesion is present within the brain parenchyma. A developmental  venous anomaly is present in the right cerebellum. Diffuse meningeal enhancement is present over both convexities, slightly more prominent on the right. No other focal parenchymal enhancement is present. Vascular: A metastatic lesion appears to displace the superior sagittal sinus without occluding it. The dural sinuses are otherwise patent. Flow is present in the major intracranial arteries. Skull and upper cervical spine: Multifocal areas of enhancing osseous metastases are present. A large enhancing mass infiltrates the greater sphenoid wing on the right. This extends into the orbital roofs with mass effect on the superior and lateral rectus muscles. Enhancing tumor extends posteriorly to the cavernous sinus, potentially along the right third cranial nerve. There is slight bowing of the right cavernous sinus and enhancement compatible with metastatic disease within the right cavernous sinus.  There is also some narrowing of the right optic canal. There is no definite abnormal signal or enhancement along the right optic nerve. The optic chiasm is intact. An additional metastasis is present in the vertex measuring 4.9 x 1.9 x 1.7 cm. This extends through both the inner and outer table of the calvarium and into the scalp. A focal metastasis in the high anterior frontal calvarium measures 14 x 6 x 10 mm. A focus in the right parietal skull measures 2.5 x 0.8 x 1.5 cm. The craniocervical junction is within normal limits. MRI ORBITS FINDINGS Orbits: Dedicated imaging of the orbits demonstrates encroachment of the right sphenoid metastasis S into the right orbit. There is mass effect on the superior and lateral rectus muscles. There is narrowing of the optic canal. Pathologic enhancement extends along the third cranial nerve course to the cavernous sinus. There is enhancing tumor in the right cavernous sinus. No definite enhancement is evident along the right optic nerve. The left orbit is unremarkable. There is mild proptosis on the right. Visualized sinuses: The paranasal sinuses are clear. Mastoid air cells are clear. Soft tissues: The soft tissues the face are otherwise unremarkable. IMPRESSION: 1. Multifocal areas of cranial enhancement compatible with osseous metastases, likely from known esophageal cancer. 2. Prominent metastasis along the greater wing of the right sphenoid with encroachment into the right orbit, mass effect on the lateral and superior semicircular canals, and right-sided proptosis. 3. Enhancement extends posteriorly into the right cavernous sinus, potentially along the right third cranial nerve. 4. Narrowing of the right optic canal. 5. Large metastasis along the vertex extending through the calvarium and into the scalp with inferior displacement of the superior sagittal sinus but no definite sinus obstruction. 6. Additional smaller osseous metastasis within the high anterior right  frontal skull and posterior left parietal skull. 7. No focal brain metastases. 8. 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 Electronically Signed   By: San Morelle M.D.   On: 04/30/2016 10:55    Procedures Procedures (including critical care time)  Medications Ordered in ED Medications  sodium chloride 0.9 % bolus 1,000 mL (0 mLs Intravenous Stopped 04/30/16 1155)  LORazepam (ATIVAN) injection 1.5 mg (1.5 mg Intravenous Given 04/30/16 0857)  gadobenate dimeglumine (MULTIHANCE) injection 20 mL (20 mLs Intravenous Contrast Given 04/30/16 1004)     Initial Impression / Assessment and Plan / ED Course  I have reviewed the triage vital signs and the nursing notes.  Pertinent labs & imaging results that were available during my care of the patient were reviewed by me and considered in my medical decision making (see chart for details).  Clinical Course     61 year old male with syncope. May be related to  peripheral vasodilation will taking hot shower? He has had similar symptoms previously while showering although never passed out previously. Currently feeling better. HD stable. EKG w/o concerning features.   He has horizontal diplopia.  Worse with abduction of R eye. Resolves with gaze to the L. Exam subtle but mild proptosis, disconjugate gaze and cannot fully abduct R eye.  I do not think this is necessarily related to syncopal event this morning. These symptoms are concerning particularly with R sided headache and cancer history. No other neuro complaints or deficits on exam. Will MRI w/ w/o contrast.   Unfortunately, imaging as above. Diplopia likely from EOM impingement. Symptoms primarily seem from impingement of lateral rectus muscle. Will place on steroids. He missed his appointment with oncology this morning because he was in the ED. Will discuss with oncology concerning possibly getting him worked in this afternoon or other recommendations.   Discussed with Dr  Alvy Bimler, heme/onc. Recommending admission. Dr Benay Spice is in the office, but should be by later.   Final Clinical Impressions(s) / ED Diagnoses   Final diagnoses:  Bone metastases ([place])  Diplopia  Syncope, unspecified syncope type    New Prescriptions New Prescriptions   No medications on file     Virgel Manifold, MD 04/30/16 1325

## 2016-04-30 NOTE — ED Notes (Signed)
RN at bedside collecting labs 

## 2016-05-01 ENCOUNTER — Observation Stay (HOSPITAL_BASED_OUTPATIENT_CLINIC_OR_DEPARTMENT_OTHER): Payer: BLUE CROSS/BLUE SHIELD

## 2016-05-01 ENCOUNTER — Ambulatory Visit: Payer: BLUE CROSS/BLUE SHIELD | Admitting: Radiation Oncology

## 2016-05-01 ENCOUNTER — Ambulatory Visit
Admit: 2016-05-01 | Discharge: 2016-05-01 | Disposition: A | Discharge status: Home / Self Care | Payer: BLUE CROSS/BLUE SHIELD | Attending: Radiation Oncology | Admitting: Radiation Oncology

## 2016-05-01 ENCOUNTER — Observation Stay (HOSPITAL_COMMUNITY): Payer: BLUE CROSS/BLUE SHIELD

## 2016-05-01 DIAGNOSIS — R55 Syncope and collapse: Secondary | ICD-10-CM | POA: Diagnosis not present

## 2016-05-01 DIAGNOSIS — C7951 Secondary malignant neoplasm of bone: Secondary | ICD-10-CM

## 2016-05-01 DIAGNOSIS — C159 Malignant neoplasm of esophagus, unspecified: Secondary | ICD-10-CM | POA: Diagnosis not present

## 2016-05-01 LAB — CBC
HCT: 40.3 % (ref 39.0–52.0)
HEMOGLOBIN: 13.7 g/dL (ref 13.0–17.0)
MCH: 28.9 pg (ref 26.0–34.0)
MCHC: 34 g/dL (ref 30.0–36.0)
MCV: 85 fL (ref 78.0–100.0)
Platelets: 181 10*3/uL (ref 150–400)
RBC: 4.74 MIL/uL (ref 4.22–5.81)
RDW: 12.8 % (ref 11.5–15.5)
WBC: 11 10*3/uL — ABNORMAL HIGH (ref 4.0–10.5)

## 2016-05-01 LAB — BASIC METABOLIC PANEL
ANION GAP: 8 (ref 5–15)
BUN: 19 mg/dL (ref 6–20)
CO2: 25 mmol/L (ref 22–32)
Calcium: 9.7 mg/dL (ref 8.9–10.3)
Chloride: 105 mmol/L (ref 101–111)
Creatinine, Ser: 0.85 mg/dL (ref 0.61–1.24)
Glucose, Bld: 150 mg/dL — ABNORMAL HIGH (ref 65–99)
Potassium: 4.4 mmol/L (ref 3.5–5.1)
Sodium: 138 mmol/L (ref 135–145)

## 2016-05-01 LAB — ECHOCARDIOGRAM COMPLETE
HEIGHTINCHES: 71 in
Weight: 3172.8 oz

## 2016-05-01 MED ORDER — IOPAMIDOL (ISOVUE-300) INJECTION 61%
INTRAVENOUS | Status: AC
  Filled 2016-05-01: qty 300

## 2016-05-01 MED ORDER — POLYETHYLENE GLYCOL 3350 17 G PO PACK
17.0000 g | PACK | Freq: Every day | ORAL | Status: DC
  Administered 2016-05-01: 17 g via ORAL
  Filled 2016-05-01: qty 1

## 2016-05-01 NOTE — Consult Note (Signed)
Radiation Oncology         [phone] ________________________________  Name: Billy Romero MRN: [id number]  Date: 04/30/16  DOB: 1955/09/01  QM:VHQIONG,EXBMW, MD  No ref. provider found     REFERRING PHYSICIAN: No ref. provider found   DIAGNOSIS: The primary encounter diagnosis was Bone metastases ([place]). Diagnoses of Syncope, Diplopia, Syncope, unspecified syncope type, and Esophagus cancer ([place]) were also pertinent to this visit.   HISTORY OF PRESENT ILLNESS: Billy Romero is a 61 y.o. male seen at the request of Dr. 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 The patient has a history of squamous cell carcinoma of the esophagus, diagnosed in December 2015. He received concurrent chemotherapy and radiotherapy which was completed in February 2016. He has had disease in the lungs and mediastinal nodes which has been managed on Keytruda since March 2017. He presented to the ED after a synocopal episode. In retrospect he recalls a right sided headache in the temple region, blurry vision which has become progressive in the last two weeks, as well as poor hand eye coordination with playing golf. In the ED an MRI of the brain and orbits was performed. No brain metastases were noted, but a large mass involving the greater sphenoid wing on the right was seen extending into the orbital roofs with mass effect on the superior and lateral rectus muscles and extending posteriorly to the cavernous sinus along the right third cranial nerve. No enhancement of the nerve was noted. Additional disease in the vertex measuring 4.9 x 1.9 x 1.7 cm which extends through the inner and outer table of the calvarium and into the scalp. A focal high metastasis in the high anterior frontal calvarium measured 14 x 6 x 10 mm, and right parietal skull metastasis measured 2.5 x .8 x 1.5 cm. We are asked to see the patient to consider palliative radiotherapy.      PREVIOUS RADIATION THERAPY: Yes    04/27/2014-06/10/2014: The patient was treated to the lower chest/upper abdomen. He initially received 9 Gray using a 2 field technique. He then received a 45 gray boost using a IM RT technique. The patient's final dose was 54 gray.  PAST MEDICAL HISTORY:  Past Medical History:  Diagnosis Date  . Chronic back pain   . Complication of anesthesia    WOKE UP DURING COLONOSCOPY  . Esophageal cancer ([place]) 03/29/14  . GERD (gastroesophageal reflux disease)   . Primary cancer of esophagus with metastasis to other site [facility]) 07/04/2015       PAST SURGICAL HISTORY: Past Surgical History:  Procedure Laterality Date  . APPENDECTOMY    . CERVICAL FUSION    . EYE SURGERY     PTEGERIUM   BIL EYES  . HAND SURGERY     DUPRAGENS CONTRACTURE  RT HAND  . KNEE ARTHROSCOPY     LEFT  . PEG PLACEMENT N/A 04/06/2014   Procedure: OPEN JEJUNOSTOMY TUBE PLACEMENT;  Surgeon: Armandina Gemma, MD;  Location: [facility];  Service: General;  Laterality: N/A;  . VIDEO BRONCHOSCOPY WITH ENDOBRONCHIAL ULTRASOUND N/A 07/04/2015   Procedure: VIDEO BRONCHOSCOPY WITH ENDOBRONCHIAL ULTRASOUND with brushings and biopsies.;  Surgeon: Grace Isaac, MD;  Location: [facility];  Service: Thoracic;  Laterality: N/A;     FAMILY HISTORY:  Family History  Problem Relation Age of Onset  . Cancer Father      SOCIAL HISTORY:  reports that he has never smoked. He has never used smokeless tobacco. He reports that he  does not drink alcohol or use drugs. The patient is married and lives in [place]. He is retired and enjoys Marketing executive.   ALLERGIES: Patient has no known allergies.   MEDICATIONS:  Current Facility-Administered Medications  Medication Dose Route Frequency Provider Last Rate Last Dose  . 0.9 %  sodium chloride infusion  250 mL Intravenous PRN Maryann Mikhail, DO      . acetaminophen (TYLENOL) tablet 650 mg  650 mg Oral Q6H PRN Maryann Mikhail, DO       Or  . acetaminophen (TYLENOL) suppository 650 mg  650 mg  Rectal Q6H PRN Maryann Mikhail, DO      . baclofen (LIORESAL) tablet 10 mg  10 mg Oral Once per day on Mon Wed Fri Maryann Mikhail, DO      . dexamethasone (DECADRON) injection 10 mg  10 mg Intravenous Q12H Maryann Mikhail, DO   10 mg at 04/30/16 2153  . diclofenac sodium (VOLTAREN) 1 % transdermal gel 4 g  4 g Topical QID PRN Maryann Mikhail, DO      . enoxaparin (LOVENOX) injection 40 mg  40 mg Subcutaneous Q24H Maryann Mikhail, DO   40 mg at 04/30/16 2154  . HYDROcodone-acetaminophen (NORCO/VICODIN) 5-325 MG per tablet 1 tablet  1 tablet Oral TID PRN Cristal Ford, DO   1 tablet at 05/01/16 0102  . meloxicam (MOBIC) tablet 15 mg  15 mg Oral Once per day on Mon Wed Fri [facility], DO      . morphine 2 MG/ML injection 2 mg  2 mg Intravenous Q4H PRN Cristal Ford, DO   2 mg at 04/30/16 2153  . ondansetron (ZOFRAN) tablet 4 mg  4 mg Oral Q6H PRN Maryann Mikhail, DO       Or  . ondansetron (ZOFRAN) injection 4 mg  4 mg Intravenous Q6H PRN Maryann Mikhail, DO      . pantoprazole (PROTONIX) EC tablet 40 mg  40 mg Oral Daily Maryann Mikhail, DO   40 mg at 04/30/16 1603  . sodium chloride flush (NS) 0.9 % injection 3 mL  3 mL Intravenous Q12H Maryann Mikhail, DO   3 mL at 04/30/16 2153  . sodium chloride flush (NS) 0.9 % injection 3 mL  3 mL Intravenous PRN Maryann Mikhail, DO      . sodium chloride flush (NS) 0.9 % injection 3 mL  3 mL Intravenous Q12H Maryann Mikhail, DO      . traMADol (ULTRAM) tablet 50 mg  50 mg Oral Q6H PRN Maryann Mikhail, DO   50 mg at 05/01/16 0102     REVIEW OF SYSTEMS: On review of systems, the patient reports that he is doing well overall. He denies any chest pain, shortness of breath, cough, fevers, chills, night sweats, unintended weight changes. He is eating well and denies any bowel or bladder disturbances, and denies abdominal pain, nausea or vomiting. Occasionally he does have some dysphagia, but maintains his weight. He denies any new musculoskeletal or joint  aches or pains. A complete review of systems is obtained and is otherwise negative.     PHYSICAL EXAM:  Wt Readings from Last 3 Encounters:  04/30/16 198 lb 4.8 oz (89.9 kg)  04/10/16 203 lb (92.1 kg)  03/20/16 205 lb 1.6 oz (93 kg)   Temp Readings from Last 3 Encounters:  05/01/16 97.9 F (36.6 C) (Oral)  04/10/16 97.9 F (36.6 C) (Oral)  03/20/16 97.8 F (36.6 C) (Oral)   BP Readings from Last 3 Encounters:  05/01/16 104/68  04/10/16 123/70  03/20/16 109/71   Pulse Readings from Last 3 Encounters:  05/01/16 86  04/10/16 73  03/20/16 73     Pain scale 0/10 In general this is a well appearing caucasian male in no acute distress. He is alert and oriented x4 and appropriate throughout the examination. HEENT reveals that the patient is normocephalic, atraumatic. EOMs are intact. PERRLA. He has deficits in the right lateral visual field on peripheral field testing. Skin is intact without any evidence of gross lesions. Cardiopulmonary assessment is negative for acute distress and he exhibits normal effort.   ECOG = 1  0 - Asymptomatic (Fully active, able to carry on all predisease activities without restriction)  1 - Symptomatic but completely ambulatory (Restricted in physically strenuous activity but ambulatory and able to carry out work of a light or sedentary nature. For example, light housework, office work)  2 - Symptomatic, <50% in bed during the day (Ambulatory and capable of all self care but unable to carry out any work activities. Up and about more than 50% of waking hours)  3 - Symptomatic, >50% in bed, but not bedbound (Capable of only limited self-care, confined to bed or chair 50% or more of waking hours)  4 - Bedbound (Completely disabled. Cannot carry on any self-care. Totally confined to bed or chair)  5 - Death   Eustace Pen MM, Creech RH, Tormey DC, et al. [id number]). "Toxicity and response criteria of the [facility]". [place] Oncol. 5  (6): 649-55    LABORATORY DATA:  Lab Results  Component Value Date   WBC 11.0 (H) 05/01/2016   HGB 13.7 05/01/2016   HCT 40.3 05/01/2016   MCV 85.0 05/01/2016   PLT 181 05/01/2016   Lab Results  Component Value Date   NA 138 05/01/2016   K 4.4 05/01/2016   CL 105 05/01/2016   CO2 25 05/01/2016   Lab Results  Component Value Date   ALT 16 03/20/2016   AST 21 03/20/2016   ALKPHOS 83 03/20/2016   BILITOT 0.71 03/20/2016      RADIOGRAPHY: Ct Chest W Contrast  Result Date: 04/25/2016 CLINICAL DATA:  Restaging esophageal cancer EXAM: CT CHEST WITH CONTRAST TECHNIQUE: Multidetector CT imaging of the chest was performed during intravenous contrast administration. CONTRAST:  92m ISOVUE-300 IOPAMIDOL (ISOVUE-300) INJECTION 61% COMPARISON:  01/15/2016 FINDINGS: Cardiovascular: The heart size is normal.  No pericardial effusion. Mediastinum/Nodes: There is moderate circumferential wall thickening involving the mid thoracic esophagus at the level of the carina for a distance of 3.8 cm, image 100 of series 7. Extensive mediastinal and hilar adenopathy is identified. Index pre-vascular lymph node measures 1.7 cm, image 56 of series 2. Previously 1.3 cm. Index left hilar lymph node measures 1.3 cm, image 68 of series 2. Previously 1.0 cm. Right hilar lymph nodal mass measures 3.6 cm, image 71 of series 2. Previously 3.3 cm. Lungs/Pleura: No pleural fluid identified. Perifissural nodule in the left upper lobe measures 9 mm, image 74 of series 5. Previously 5 mm. Increased in smooth interstitial thickening in both lungs suggestive of pulmonary edema. Subpleural nodule in the left upper lobe measures 5 mm, image 52 of series 5. Previously 2 mm. Upper Abdomen: Enlarged gastrohepatic ligament lymph node Measures 1.6 cm, image 126 of series 2. Previously 1.4 cm. No acute abnormality identified within the upper abdomen. Musculoskeletal: There is degenerative disc disease identified within the thoracic  spine. No aggressive lytic or sclerotic bone lesions. IMPRESSION:  1. There has been mild progression of mediastinal and hilar adenopathy. Upper abdominal gastrohepatic ligament lymph node is increased in size. 2. Small pulmonary nodules within the left upper lobe are also increased in size in the interval. 3. Mild pulmonary edema. Electronically Signed   By: Kerby Moors M.D.   On: 04/25/2016 11:10   Mr Jeri Cos RD Contrast  Result Date: 04/30/2016 CLINICAL DATA:  Multiple near syncopal episodes associated with taking hot shower. Horizontal diplopia. Pain and pressure behind the right eye. Esophageal cancer with known metastatic disease. EXAM: MRI HEAD AND ORBITS WITHOUT AND WITH CONTRAST TECHNIQUE: Multiplanar, multiecho pulse sequences of the brain and surrounding structures were obtained without and with intravenous contrast. Multiplanar, multiecho pulse sequences of the orbits and surrounding structures were obtained including fat saturation techniques, before and after intravenous contrast administration. CONTRAST:  37m MULTIHANCE GADOBENATE DIMEGLUMINE 529 MG/ML IV SOLN COMPARISON:  Whole-body PET scan 06/16/2015. CT of the chest 04/25/2016. FINDINGS: MRI HEAD FINDINGS Brain: No acute infarct, hemorrhage, or mass lesion is present within the brain parenchyma. A developmental venous anomaly is present in the right cerebellum. Diffuse meningeal enhancement is present over both convexities, slightly more prominent on the right. No other focal parenchymal enhancement is present. Vascular: A metastatic lesion appears to displace the superior sagittal sinus without occluding it. The dural sinuses are otherwise patent. Flow is present in the major intracranial arteries. Skull and upper cervical spine: Multifocal areas of enhancing osseous metastases are present. A large enhancing mass infiltrates the greater sphenoid wing on the right. This extends into the orbital roofs with mass effect on the superior and  lateral rectus muscles. Enhancing tumor extends posteriorly to the cavernous sinus, potentially along the right third cranial nerve. There is slight bowing of the right cavernous sinus and enhancement compatible with metastatic disease within the right cavernous sinus. There is also some narrowing of the right optic canal. There is no definite abnormal signal or enhancement along the right optic nerve. The optic chiasm is intact. An additional metastasis is present in the vertex measuring 4.9 x 1.9 x 1.7 cm. This extends through both the inner and outer table of the calvarium and into the scalp. A focal metastasis in the high anterior frontal calvarium measures 14 x 6 x 10 mm. A focus in the right parietal skull measures 2.5 x 0.8 x 1.5 cm. The craniocervical junction is within normal limits. MRI ORBITS FINDINGS Orbits: Dedicated imaging of the orbits demonstrates encroachment of the right sphenoid metastasis S into the right orbit. There is mass effect on the superior and lateral rectus muscles. There is narrowing of the optic canal. Pathologic enhancement extends along the third cranial nerve course to the cavernous sinus. There is enhancing tumor in the right cavernous sinus. No definite enhancement is evident along the right optic nerve. The left orbit is unremarkable. There is mild proptosis on the right. Visualized sinuses: The paranasal sinuses are clear. Mastoid air cells are clear. Soft tissues: The soft tissues the face are otherwise unremarkable. IMPRESSION: 1. Multifocal areas of cranial enhancement compatible with osseous metastases, likely from known esophageal cancer. 2. Prominent metastasis along the greater wing of the right sphenoid with encroachment into the right orbit, mass effect on the lateral and superior semicircular canals, and right-sided proptosis. 3. Enhancement extends posteriorly into the right cavernous sinus, potentially along the right third cranial nerve. 4. Narrowing of the right  optic canal. 5. Large metastasis along the vertex extending through the calvarium and into  the scalp with inferior displacement of the superior sagittal sinus but no definite sinus obstruction. 6. Additional smaller osseous metastasis within the high anterior right frontal skull and posterior left parietal skull. 7. No focal brain metastases. 8. 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 Electronically Signed   By: San Morelle M.D.   On: 04/30/2016 10:55   Mr Rosealee Albee CV Contrast  Result Date: 04/30/2016 CLINICAL DATA:  Multiple near syncopal episodes associated with taking hot shower. Horizontal diplopia. Pain and pressure behind the right eye. Esophageal cancer with known metastatic disease. EXAM: MRI HEAD AND ORBITS WITHOUT AND WITH CONTRAST TECHNIQUE: Multiplanar, multiecho pulse sequences of the brain and surrounding structures were obtained without and with intravenous contrast. Multiplanar, multiecho pulse sequences of the orbits and surrounding structures were obtained including fat saturation techniques, before and after intravenous contrast administration. CONTRAST:  83m MULTIHANCE GADOBENATE DIMEGLUMINE 529 MG/ML IV SOLN COMPARISON:  Whole-body PET scan 06/16/2015. CT of the chest 04/25/2016. FINDINGS: MRI HEAD FINDINGS Brain: No acute infarct, hemorrhage, or mass lesion is present within the brain parenchyma. A developmental venous anomaly is present in the right cerebellum. Diffuse meningeal enhancement is present over both convexities, slightly more prominent on the right. No other focal parenchymal enhancement is present. Vascular: A metastatic lesion appears to displace the superior sagittal sinus without occluding it. The dural sinuses are otherwise patent. Flow is present in the major intracranial arteries. Skull and upper cervical spine: Multifocal areas of enhancing osseous metastases are present. A large enhancing mass infiltrates the greater sphenoid wing on the  right. This extends into the orbital roofs with mass effect on the superior and lateral rectus muscles. Enhancing tumor extends posteriorly to the cavernous sinus, potentially along the right third cranial nerve. There is slight bowing of the right cavernous sinus and enhancement compatible with metastatic disease within the right cavernous sinus. There is also some narrowing of the right optic canal. There is no definite abnormal signal or enhancement along the right optic nerve. The optic chiasm is intact. An additional metastasis is present in the vertex measuring 4.9 x 1.9 x 1.7 cm. This extends through both the inner and outer table of the calvarium and into the scalp. A focal metastasis in the high anterior frontal calvarium measures 14 x 6 x 10 mm. A focus in the right parietal skull measures 2.5 x 0.8 x 1.5 cm. The craniocervical junction is within normal limits. MRI ORBITS FINDINGS Orbits: Dedicated imaging of the orbits demonstrates encroachment of the right sphenoid metastasis S into the right orbit. There is mass effect on the superior and lateral rectus muscles. There is narrowing of the optic canal. Pathologic enhancement extends along the third cranial nerve course to the cavernous sinus. There is enhancing tumor in the right cavernous sinus. No definite enhancement is evident along the right optic nerve. The left orbit is unremarkable. There is mild proptosis on the right. Visualized sinuses: The paranasal sinuses are clear. Mastoid air cells are clear. Soft tissues: The soft tissues the face are otherwise unremarkable. IMPRESSION: 1. Multifocal areas of cranial enhancement compatible with osseous metastases, likely from known esophageal cancer. 2. Prominent metastasis along the greater wing of the right sphenoid with encroachment into the right orbit, mass effect on the lateral and superior semicircular canals, and right-sided proptosis. 3. Enhancement extends posteriorly into the right cavernous  sinus, potentially along the right third cranial nerve. 4. Narrowing of the right optic canal. 5. Large metastasis along the vertex extending through the  calvarium and into the scalp with inferior displacement of the superior sagittal sinus but no definite sinus obstruction. 6. Additional smaller osseous metastasis within the high anterior right frontal skull and posterior left parietal skull. 7. No focal brain metastases. 8. 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 Electronically Signed   By: San Morelle M.D.   On: 04/30/2016 10:55       IMPRESSION/PLAN: 1. 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 We met with the patient this evening to discuss the findings on his MRI of the brain and orbits. Dr. Lisbeth Renshaw and his partners have recommended a course of radiotherapy, and agree with steroid administration to help with his ocular symptoms. We discussed that he appears to be a candidate for palliative radiotherapy to this site, and we discussed the risks, benefits, short, and long term effects of this style of therapy as well as the logistics and delivery of treatment. He would be a candidate for about 10 fractions of treatment. He is interested in moving forward, and will come tomorrow for simulation planning in our department.  In a visit lasting 70  minutes, greater than 50% of the time was spent face to face and during floor time discussing his case, imaging, and coordinating the patient's care.     Carola Rhine, PAC

## 2016-05-01 NOTE — Progress Notes (Signed)
  Echocardiogram 2D Echocardiogram has been performed.  Billy Romero 05/01/2016, 9:25 AM

## 2016-05-01 NOTE — Discharge Summary (Signed)
Physician Discharge Summary  Billy Romero FVC:[id number] DOB: 08/19/1955 DOA: 04/30/2016  PCP: Lujean Amel, MD  Admit date: 04/30/2016 Discharge date: 05/01/2016  Admitted From: Home Disposition:  Home  Recommendations for Outpatient Follow-up:  1. Follow up with PCP in 2-3 weeks 2. Follow up with Dr. Benay Spice as scheduled  Discharge Condition:Stable CODE STATUS:Full Diet recommendation: Regular   Brief/Interim Summary: 61 y.o. male with a medical history of esophageal cancer with metastasis, GERD, who presented to the emergency department with complaints of syncope and loss of consciousness today. Patient was in the shower when these events occurred. Patient states he began to feel very nauseated and hot. He became very lightheaded and passed out but cannot remember for how long. Patient states he's been having these symptoms in the past, while at a park, while in the shower and doing other activities. He normally has been "able to shake it off". Patient has been getting chemotherapy and follows up with Dr. Benay Spice.  Patient is also been complaining of double vision which is been ongoing for the past 3-4 days. He has felt some pressure and pain behind his right eye and complained of mild headache on the right side. Currently patient denies any chest pain, shortness of breath, abdominal pain, nausea or vomiting, diarrhea constipation. He denies any recent illness or travel.  Syncope with loss of consciousness -Suspect secondary to metastasis -MRI brain/orbits with and without contrast showed multifocal areas of cranial enhancement compatible with all sees metastasis likely from known esophageal cancer. Prominent metastasis along the greater wing of the right sphenoid with encroachment into the right orbital, mass effect of the lateral and superior semicircle canals, right-sided proptosis. 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 Benay Spice, was consulted. Recs for barium swallow study which did show moderate stricture at the site of the tumor. Patient noted to be tolerating diet.  -Have placed patient on Decadron 10 mg IV twice a day -discussed case with Oncology who recommends decadron '4mg'$  po BID -Radiation oncology had seen patient, pt is now s/p CT sim study -Continue pain control  GERD -Continued PPI -Remained stable  Discharge Diagnoses:  Active Problems:   Primary cancer of esophagus with metastasis to other site [facility])   Syncope    Discharge Instructions   Allergies as of 05/01/2016   No Known Allergies     Medication List    TAKE these medications   baclofen 10 MG tablet Commonly known as:  LIORESAL Take 10 mg by mouth 3 (three) times a week.   dexamethasone 4 MG tablet Commonly known as:  DECADRON Take 1 tablet (4 mg total) by mouth 2 (two) times daily with a meal.   diclofenac sodium 1 % Gel Commonly known as:  VOLTAREN Apply 4 g topically 4 (four) times daily as needed (pain).   esomeprazole 20 MG capsule Commonly known as:  NEXIUM Take 20 mg by mouth daily at 12 noon.   HYDROcodone-acetaminophen 5-325 MG tablet Commonly known as:  NORCO/VICODIN Take 1 tablet by mouth 3 (three) times daily as needed for moderate pain. for pain   meloxicam 15 MG tablet Commonly known as:  MOBIC Take 15 mg by mouth 3 (three) times a week.   traMADol 50 MG tablet Commonly known as:  ULTRAM Take 50 mg by mouth every 6 (six) hours as needed.  Follow-up Information    Betsy Coder, MD Follow up.   Specialty:  Oncology Contact information: [place]  [address] [phone]        Lujean Amel, MD. Schedule an appointment as soon as possible for a visit in 2 week(s).   Specialty:  Family Medicine Contact information: [place]  [address] [address] [phone]          No Known Allergies  Consultations:  Oncology  Radiation Oncology  Procedures/Studies: Ct Chest W Contrast  Result Date: 04/25/2016 CLINICAL DATA:  Restaging esophageal cancer EXAM: CT CHEST WITH CONTRAST TECHNIQUE: Multidetector CT imaging of the chest was performed during intravenous contrast administration. CONTRAST:  96m ISOVUE-300 IOPAMIDOL (ISOVUE-300) INJECTION 61% COMPARISON:  01/15/2016 FINDINGS: Cardiovascular: The heart size is normal.  No pericardial effusion. Mediastinum/Nodes: There is moderate circumferential wall thickening involving the mid thoracic esophagus at the level of the carina for a distance of 3.8 cm, image 100 of series 7. Extensive mediastinal and hilar adenopathy is identified. Index pre-vascular lymph node measures 1.7 cm, image 56 of series 2. Previously 1.3 cm. Index left hilar lymph node measures 1.3 cm, image 68 of series 2. Previously 1.0 cm. Right hilar lymph nodal mass measures 3.6 cm, image 71 of series 2. Previously 3.3 cm. Lungs/Pleura: No pleural fluid identified. Perifissural nodule in the left upper lobe measures 9 mm, image 74 of series 5. Previously 5 mm. Increased in smooth interstitial thickening in both lungs suggestive of pulmonary edema. Subpleural nodule in the left upper lobe measures 5 mm, image 52 of series 5. Previously 2 mm. Upper Abdomen: Enlarged gastrohepatic ligament lymph node Measures 1.6 cm, image 126 of series 2. Previously 1.4 cm. No acute abnormality identified within the upper abdomen. Musculoskeletal: There is degenerative disc disease identified within the thoracic spine. No aggressive lytic or sclerotic bone lesions. IMPRESSION: 1. There has been mild progression of mediastinal and hilar adenopathy. Upper abdominal gastrohepatic ligament lymph node is increased in size. 2. Small pulmonary nodules within the left upper lobe are also increased in size in the interval. 3. Mild  pulmonary edema. Electronically Signed   By: TKerby MoorsM.D.   On: 04/25/2016 11:10   Mr BJeri CosWRCContrast  Result Date: 04/30/2016 CLINICAL DATA:  Multiple near syncopal episodes associated with taking hot shower. Horizontal diplopia. Pain and pressure behind the right eye. Esophageal cancer with known metastatic disease. EXAM: MRI HEAD AND ORBITS WITHOUT AND WITH CONTRAST TECHNIQUE: Multiplanar, multiecho pulse sequences of the brain and surrounding structures were obtained without and with intravenous contrast. Multiplanar, multiecho pulse sequences of the orbits and surrounding structures were obtained including fat saturation techniques, before and after intravenous contrast administration. CONTRAST:  230mMULTIHANCE GADOBENATE DIMEGLUMINE 529 MG/ML IV SOLN COMPARISON:  Whole-body PET scan 06/16/2015. CT of the chest 04/25/2016. FINDINGS: MRI HEAD FINDINGS Brain: No acute infarct, hemorrhage, or mass lesion is present within the brain parenchyma. A developmental venous anomaly is present in the right cerebellum. Diffuse meningeal enhancement is present over both convexities, slightly more prominent on the right. No other focal parenchymal enhancement is present. Vascular: A metastatic lesion appears to displace the superior sagittal sinus without occluding it. The dural sinuses are otherwise patent. Flow is present in the major intracranial arteries. Skull and upper cervical spine: Multifocal areas of enhancing osseous metastases are present. A large enhancing mass infiltrates the greater sphenoid wing on the right. This extends into the orbital roofs with mass effect on the  superior and lateral rectus muscles. Enhancing tumor extends posteriorly to the cavernous sinus, potentially along the right third cranial nerve. There is slight bowing of the right cavernous sinus and enhancement compatible with metastatic disease within the right cavernous sinus. There is also some narrowing of the right optic  canal. There is no definite abnormal signal or enhancement along the right optic nerve. The optic chiasm is intact. An additional metastasis is present in the vertex measuring 4.9 x 1.9 x 1.7 cm. This extends through both the inner and outer table of the calvarium and into the scalp. A focal metastasis in the high anterior frontal calvarium measures 14 x 6 x 10 mm. A focus in the right parietal skull measures 2.5 x 0.8 x 1.5 cm. The craniocervical junction is within normal limits. MRI ORBITS FINDINGS Orbits: Dedicated imaging of the orbits demonstrates encroachment of the right sphenoid metastasis S into the right orbit. There is mass effect on the superior and lateral rectus muscles. There is narrowing of the optic canal. Pathologic enhancement extends along the third cranial nerve course to the cavernous sinus. There is enhancing tumor in the right cavernous sinus. No definite enhancement is evident along the right optic nerve. The left orbit is unremarkable. There is mild proptosis on the right. Visualized sinuses: The paranasal sinuses are clear. Mastoid air cells are clear. Soft tissues: The soft tissues the face are otherwise unremarkable. IMPRESSION: 1. Multifocal areas of cranial enhancement compatible with osseous metastases, likely from known esophageal cancer. 2. Prominent metastasis along the greater wing of the right sphenoid with encroachment into the right orbit, mass effect on the lateral and superior semicircular canals, and right-sided proptosis. 3. Enhancement extends posteriorly into the right cavernous sinus, potentially along the right third cranial nerve. 4. Narrowing of the right optic canal. 5. Large metastasis along the vertex extending through the calvarium and into the scalp with inferior displacement of the superior sagittal sinus but no definite sinus obstruction. 6. Additional smaller osseous metastasis within the high anterior right frontal skull and posterior left parietal skull. 7.  No focal brain metastases. 8. 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 Electronically Signed   By: San Morelle M.D.   On: 04/30/2016 10:55   Dg Esophagus  Result Date: 05/01/2016 CLINICAL DATA:  Dysphagia intermittently requiring regurgitation of food, mainly noted over the past 3 weeks. History of treated esophageal cancer. EXAM: ESOPHOGRAM/BARIUM SWALLOW TECHNIQUE: Single contrast examination was performed using  thin barium. FLUOROSCOPY TIME:  Fluoroscopy Time:  1.3 minutes Radiation Exposure Index (if provided by the fluoroscopic device): 8.4 mGy Number of Acquired Spot Images: 1 COMPARISON:  None. FINDINGS: The patient could not tolerate double contrast exam due to symptoms. Lateral pharyngeal imaging was performed and showed no aspiration or obstructive process. Status post C5-6 and C6-7 ACDF with no complicating features or mass effect on the esophagus. Circumferential narrowing of the mid esophagus causing a moderate degree of stricture which would not allow 13 mm barium tablet to pass. The stricture has smooth but lobulated margins and annular appearing. Maximal luminal diameter at the narrowest portion was measured at 6 mm. The remainder of the esophagus had a normal appearance. Motility was normal. No hiatal hernia. IMPRESSION: Moderate stricture at the patient's mid esophageal tumor. The appearance is primarily concerning for residual/progressive tumor but radiation stricture could contribute. Electronically Signed   By: Monte Fantasia M.D.   On: 05/01/2016 10:28   Mr Rosealee Albee YQ Contrast  Result Date: 04/30/2016 CLINICAL DATA:  Multiple near syncopal episodes associated with taking hot shower. Horizontal diplopia. Pain and pressure behind the right eye. Esophageal cancer with known metastatic disease. EXAM: MRI HEAD AND ORBITS WITHOUT AND WITH CONTRAST TECHNIQUE: Multiplanar, multiecho pulse sequences of the brain and surrounding structures were obtained without and with  intravenous contrast. Multiplanar, multiecho pulse sequences of the orbits and surrounding structures were obtained including fat saturation techniques, before and after intravenous contrast administration. CONTRAST:  35m MULTIHANCE GADOBENATE DIMEGLUMINE 529 MG/ML IV SOLN COMPARISON:  Whole-body PET scan 06/16/2015. CT of the chest 04/25/2016. FINDINGS: MRI HEAD FINDINGS Brain: No acute infarct, hemorrhage, or mass lesion is present within the brain parenchyma. A developmental venous anomaly is present in the right cerebellum. Diffuse meningeal enhancement is present over both convexities, slightly more prominent on the right. No other focal parenchymal enhancement is present. Vascular: A metastatic lesion appears to displace the superior sagittal sinus without occluding it. The dural sinuses are otherwise patent. Flow is present in the major intracranial arteries. Skull and upper cervical spine: Multifocal areas of enhancing osseous metastases are present. A large enhancing mass infiltrates the greater sphenoid wing on the right. This extends into the orbital roofs with mass effect on the superior and lateral rectus muscles. Enhancing tumor extends posteriorly to the cavernous sinus, potentially along the right third cranial nerve. There is slight bowing of the right cavernous sinus and enhancement compatible with metastatic disease within the right cavernous sinus. There is also some narrowing of the right optic canal. There is no definite abnormal signal or enhancement along the right optic nerve. The optic chiasm is intact. An additional metastasis is present in the vertex measuring 4.9 x 1.9 x 1.7 cm. This extends through both the inner and outer table of the calvarium and into the scalp. A focal metastasis in the high anterior frontal calvarium measures 14 x 6 x 10 mm. A focus in the right parietal skull measures 2.5 x 0.8 x 1.5 cm. The craniocervical junction is within normal limits. MRI ORBITS FINDINGS  Orbits: Dedicated imaging of the orbits demonstrates encroachment of the right sphenoid metastasis S into the right orbit. There is mass effect on the superior and lateral rectus muscles. There is narrowing of the optic canal. Pathologic enhancement extends along the third cranial nerve course to the cavernous sinus. There is enhancing tumor in the right cavernous sinus. No definite enhancement is evident along the right optic nerve. The left orbit is unremarkable. There is mild proptosis on the right. Visualized sinuses: The paranasal sinuses are clear. Mastoid air cells are clear. Soft tissues: The soft tissues the face are otherwise unremarkable. IMPRESSION: 1. Multifocal areas of cranial enhancement compatible with osseous metastases, likely from known esophageal cancer. 2. Prominent metastasis along the greater wing of the right sphenoid with encroachment into the right orbit, mass effect on the lateral and superior semicircular canals, and right-sided proptosis. 3. Enhancement extends posteriorly into the right cavernous sinus, potentially along the right third cranial nerve. 4. Narrowing of the right optic canal. 5. Large metastasis along the vertex extending through the calvarium and into the scalp with inferior displacement of the superior sagittal sinus but no definite sinus obstruction. 6. Additional smaller osseous metastasis within the high anterior right frontal skull and posterior left parietal skull. 7. No focal brain metastases. 8. 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 Electronically Signed   By: CSan MorelleM.D.   On: 04/30/2016 10:55     Subjective: Eager to go home  Discharge  Exam: Vitals:   04/30/16 2050 05/01/16 0450  BP: 107/68 104/68  Pulse:  86  Resp: 20 20  Temp: 99.1 F (37.3 C) 97.9 F (36.6 C)   Vitals:   04/30/16 1453 04/30/16 1532 04/30/16 2050 05/01/16 0450  BP: 137/76  107/68 104/68  Pulse: 84   86  Resp: '23 20 20 20  '$ Temp:  98.8 F  (37.1 C) 99.1 F (37.3 C) 97.9 F (36.6 C)  TempSrc:  Oral Oral Oral  SpO2: 98% 99% 95% 98%  Weight:  89.9 kg (198 lb 4.8 oz)    Height:  '5\' 11"'$  (1.803 m)      General: Pt is alert, awake, not in acute distress Cardiovascular: RRR, S1/S2 +, no rubs, no gallops Respiratory: CTA bilaterally, no wheezing, no rhonchi Abdominal: Soft, NT, ND, bowel sounds + Extremities: no edema, no cyanosis   The results of significant diagnostics from this hospitalization (including imaging, microbiology, ancillary and laboratory) are listed below for reference.     Microbiology: No results found for this or any previous visit (from the past 240 hour(s)).   Labs: BNP (last 3 results) No results for input(s): BNP in the last 8760 hours. Basic Metabolic Panel:  Recent Labs Lab 04/30/16 0834 05/01/16 0605  NA 139 138  K 3.7 4.4  CL 105 105  CO2 29 25  GLUCOSE 115* 150*  BUN 17 19  CREATININE 1.03 0.85  CALCIUM 9.3 9.7   Liver Function Tests: No results for input(s): AST, ALT, ALKPHOS, BILITOT, PROT, ALBUMIN in the last 168 hours. No results for input(s): LIPASE, AMYLASE in the last 168 hours. No results for input(s): AMMONIA in the last 168 hours. CBC:  Recent Labs Lab 04/30/16 0834 05/01/16 0605  WBC 5.7 11.0*  NEUTROABS 4.3  --   HGB 13.9 13.7  HCT 39.6 40.3  MCV 85.3 85.0  PLT 158 181   Cardiac Enzymes: No results for input(s): CKTOTAL, CKMB, CKMBINDEX, TROPONINI in the last 168 hours. BNP: Invalid input(s): POCBNP CBG: No results for input(s): GLUCAP in the last 168 hours. D-Dimer No results for input(s): DDIMER in the last 72 hours. Hgb A1c No results for input(s): HGBA1C in the last 72 hours. Lipid Profile No results for input(s): CHOL, HDL, LDLCALC, TRIG, CHOLHDL, LDLDIRECT in the last 72 hours. Thyroid function studies No results for input(s): TSH, T4TOTAL, T3FREE, THYROIDAB in the last 72 hours.  Invalid input(s): FREET3 Anemia work up No results for  input(s): VITAMINB12, FOLATE, FERRITIN, TIBC, IRON, RETICCTPCT in the last 72 hours. Urinalysis    Component Value Date/Time   COLORURINE YELLOW 04/30/2016 [place] 04/30/2016 1155   LABSPEC 1.017 04/30/2016 1155   PHURINE 7.0 04/30/2016 1155   GLUCOSEU NEGATIVE 04/30/2016 1155   HGBUR NEGATIVE 04/30/2016 [place] 04/30/2016 1155   KETONESUR NEGATIVE 04/30/2016 1155   PROTEINUR NEGATIVE 04/30/2016 1155   UROBILINOGEN 1.0 04/11/2014 1830   NITRITE NEGATIVE 04/30/2016 1155   LEUKOCYTESUR NEGATIVE 04/30/2016 1155   Sepsis Labs Invalid input(s): PROCALCITONIN,  WBC,  LACTICIDVEN Microbiology No results found for this or any previous visit (from the past 240 hour(s)).   SIGNED:   Donne Hazel, MD  [facility] 05/01/2016, 12:28 PM  If 7PM-7AM, please contact night-coverage [email or web address] Password [email or web address]

## 2016-05-01 NOTE — Telephone Encounter (Signed)
Called the floor for room 1414,spoke with RN Stanton Kidney, patient is going for barium swallow right now in next 5 minutes, they will all ct sim at #[id number] when completed, so we can go and get patient for his 1000am Ct simulation to his right eye, which will take about an hour, then called Amy in Ct sim room, and gave updayted status 9:05 AM

## 2016-05-02 ENCOUNTER — Other Ambulatory Visit: Payer: Self-pay | Admitting: Radiation Oncology

## 2016-05-02 ENCOUNTER — Telehealth: Payer: Self-pay | Admitting: *Deleted

## 2016-05-02 ENCOUNTER — Ambulatory Visit
Admission: RE | Admit: 2016-05-02 | Discharge: 2016-05-02 | Disposition: A | Discharge status: Home / Self Care | Payer: BLUE CROSS/BLUE SHIELD | Source: Ambulatory Visit | Attending: Radiation Oncology | Admitting: Radiation Oncology

## 2016-05-02 DIAGNOSIS — Z510 Encounter for antineoplastic radiation therapy: Secondary | ICD-10-CM | POA: Diagnosis not present

## 2016-05-02 DIAGNOSIS — Z79899 Other long term (current) drug therapy: Secondary | ICD-10-CM | POA: Diagnosis not present

## 2016-05-02 DIAGNOSIS — C801 Malignant (primary) neoplasm, unspecified: Secondary | ICD-10-CM | POA: Diagnosis not present

## 2016-05-02 DIAGNOSIS — C7951 Secondary malignant neoplasm of bone: Secondary | ICD-10-CM | POA: Diagnosis not present

## 2016-05-02 MED ORDER — DEXAMETHASONE 4 MG PO TABS: 4.0000 mg | ORAL_TABLET | Freq: Two times a day (BID) | ORAL | 0 refills | Status: DC

## 2016-05-02 NOTE — Telephone Encounter (Signed)
Called patient and  Asked him to continue his decadron '4mg'$   Bid ,will probably tqper jhim mid next week, he only has 8 more left will need rx to called to [facility],  He knows his rad tx today is 2:45pm today and be here 11:38 AM

## 2016-05-03 ENCOUNTER — Encounter: Payer: Self-pay | Admitting: Radiation Oncology

## 2016-05-03 ENCOUNTER — Ambulatory Visit
Admission: RE | Admit: 2016-05-03 | Discharge: 2016-05-03 | Disposition: A | Discharge status: Home / Self Care | Payer: BLUE CROSS/BLUE SHIELD | Source: Ambulatory Visit | Attending: Radiation Oncology | Admitting: Radiation Oncology

## 2016-05-03 DIAGNOSIS — Z510 Encounter for antineoplastic radiation therapy: Secondary | ICD-10-CM | POA: Diagnosis not present

## 2016-05-03 DIAGNOSIS — C7951 Secondary malignant neoplasm of bone: Secondary | ICD-10-CM | POA: Insufficient documentation

## 2016-05-03 NOTE — Progress Notes (Signed)
   Department of Radiation Oncology  Phone:  [phone] Fax:        [phone]  Weekly Treatment Note    Name: Billy Romero Date: 05/03/2016 MRN: [id number] DOB: 1955/05/26   Diagnosis:     ICD-9-CM ICD-10-CM   1. 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 baclofen (LIORESAL) 10 MG tablet Take 10 mg by mouth 3 (three) times a week.     Marland Kitchen dexamethasone (DECADRON) 4 MG tablet Take 1 tablet (4 mg total) by mouth 2 (two) times daily. 40 tablet 0  . diclofenac sodium (VOLTAREN) 1 % GEL Apply 4 g topically 4 (four) times daily as needed (pain).   2  . esomeprazole (NEXIUM) 20 MG capsule Take 20 mg by mouth daily at 12 noon.    Marland Kitchen HYDROcodone-acetaminophen (NORCO/VICODIN) 5-325 MG tablet Take 1 tablet by mouth 3 (three) times daily as needed for moderate pain. for pain 60 tablet 0  . meloxicam (MOBIC) 15 MG tablet Take 15 mg by mouth 3 (three) times a week.     . traMADol (ULTRAM) 50 MG tablet Take 50 mg by mouth every 6 (six) hours as needed.  0   No current facility-administered medications for this encounter.      ALLERGIES: Patient has no known allergies.   LABORATORY DATA:  Lab Results  Component Value Date   WBC 11.0 (H) 05/01/2016   HGB 13.7 05/01/2016   HCT 40.3 05/01/2016   MCV 85.0 05/01/2016   PLT 181 05/01/2016   Lab Results  Component Value Date   NA 138 05/01/2016   K 4.4 05/01/2016   CL 105 05/01/2016   CO2 25 05/01/2016   Lab Results  Component Value Date   ALT 16 03/20/2016   AST 21 03/20/2016   ALKPHOS 83 03/20/2016   BILITOT 0.71 03/20/2016     NARRATIVE: [name] was seen today for weekly treatment management. The chart was checked and the patient's films were reviewed.  Weekly radiation right orbit/cranium 2/10 completed, discussed side effects , head aches, vision changes, fatigue,skin irritation, hair  loss,  patient has a head ache  4/10 scale, took norco this morning, has throat  Difficulty from esophagel swelling, was taking [place] every 3 weeks,   Wearing a patch over his right eye, no nausea , mild fatigue 3:35 PM BP 126/86 (BP Location: Left Arm, Patient Position: Sitting, Cuff Size: Normal)   Pulse 95   Temp 97.8 F (36.6 C) (Oral)   Resp 20   Wt 201 lb 6.4 oz (91.4 kg)   BMI 28.09 kg/m   Wt Readings from Last 3 Encounters:  05/03/16 201 lb 6.4 oz (91.4 kg)  04/30/16 198 lb 4.8 oz (89.9 kg)  04/10/16 203 lb (92.1 kg)    PHYSICAL EXAMINATION: weight is 201 lb 6.4 oz (91.4 kg). His oral temperature is 97.8 F (36.6 C). His blood pressure is 126/86 and his pulse is 95. His respiration is 20.        ASSESSMENT: The patient is doing satisfactorily with treatment.  PLAN: We will continue with the patient's radiation treatment as planned.

## 2016-05-03 NOTE — Progress Notes (Signed)
Weekly radiation right orbit/cranium 2/10 completed, discussed side effects , head aches, vision changes, fatigue,skin irritation, hair loss,  patient has a head ache  4/10 scale, took norco this morning, has throat  Difficulty from esophagel swelling, was taking [place] every 3 weeks,   Wearing a patch over his right eye, no nausea , mild fatigue 1:26 PM BP 126/86 (BP Location: Left Arm, Patient Position: Sitting, Cuff Size: Normal)   Pulse 95   Temp 97.8 F (36.6 C) (Oral)   Resp 20   Wt 201 lb 6.4 oz (91.4 kg)   BMI 28.09 kg/m   Wt Readings from Last 3 Encounters:  05/03/16 201 lb 6.4 oz (91.4 kg)  04/30/16 198 lb 4.8 oz (89.9 kg)  04/10/16 203 lb (92.1 kg)

## 2016-05-03 NOTE — Progress Notes (Signed)
  Radiation Oncology         [phone] ________________________________  Name: Billy Romero MRN: [id number]  Date: 05/01/2016  DOB: 1956-04-23  SIMULATION AND TREATMENT PLANNING NOTE  DIAGNOSIS:     ICD-9-CM ICD-10-CM   1. 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 Right base of skull/posterior orbit 2. Posterior/ superior skull  NARRATIVE:  The patient was brought to the [place].  Identity was confirmed.  All relevant records and images related to the planned course of therapy were reviewed.   Written consent to proceed with treatment was confirmed which was freely given after reviewing the details related to the planned course of therapy had been reviewed with the patient.  Then, the patient was set-up in a stable reproducible  supine position for radiation therapy.  CT images were obtained.  Surface markings were placed.    Medically necessary complex treatment device(s) for immobilization:  Customized thermoplastic head cast.   The CT images were loaded into the planning software.  Then the target and avoidance structures were contoured.  Treatment planning then occurred.  The radiation prescription was entered and confirmed.  A total of 7 complex treatment devices were fabricated which relate to the designed radiation treatment fields: 4 fields to treat the base of skull region and a 3 field approach to treat the skull metastasis  . Each of these customized fields/ complex treatment devices will be used on a daily basis during the radiation course. I have requested : 3D Simulation  I have requested a DVH of the following structures: Target volume, right orbit, right lens, chiasm, optic nerves.   The patient will undergo daily image guidance to ensure accurate localization of the target, and adequate minimize dose to the normal surrounding structures in close proximity to the target.   PLAN:  The patient will receive 30Gy in 10  fractions.  ________________________________   Jodelle Gross, MD, PhD

## 2016-05-06 ENCOUNTER — Ambulatory Visit
Admission: RE | Admit: 2016-05-06 | Discharge: 2016-05-06 | Disposition: A | Discharge status: Home / Self Care | Payer: BLUE CROSS/BLUE SHIELD | Source: Ambulatory Visit | Attending: Radiation Oncology | Admitting: Radiation Oncology

## 2016-05-06 ENCOUNTER — Telehealth: Payer: Self-pay | Admitting: *Deleted

## 2016-05-06 ENCOUNTER — Encounter: Payer: Self-pay | Admitting: *Deleted

## 2016-05-06 DIAGNOSIS — Z510 Encounter for antineoplastic radiation therapy: Secondary | ICD-10-CM | POA: Diagnosis not present

## 2016-05-06 NOTE — Telephone Encounter (Signed)
called [facility]    To check if decadron refill did  Go through from [place], [place] on 05/02/16 via e-scribe, spoke with pharmacist Bryson Ha, 'no, so gave verbal order for  Dexamethasone(decadron0 '4mg'$  tablet,  Take 1 tab bid oral, 40 tabs  Dispensed, they wi;; call patient when rx ready,  10:59 AM

## 2016-05-06 NOTE — Progress Notes (Signed)
Oncology Nurse Navigator Documentation  Oncology Nurse Navigator Flowsheets 05/06/2016  Navigator Location [facility]  Referral date to RadOnc/MedOnc -  Navigator Encounter Type Treatment  Abnormal Finding Date -  Confirmed Diagnosis Date -  Treatment Initiated Date -  Patient Visit Type RadOnc  Treatment Phase Active Tx--currently having brain RT  Barriers/Navigation Needs Coordination of Care  Interventions Coordination of Care  Coordination of Care Appts--f/u with Ned Card 05/14/16--gave patient appointment calendar  Support Groups/Services -  Acuity -  Time Spent with Patient 15

## 2016-05-07 ENCOUNTER — Ambulatory Visit
Admission: RE | Admit: 2016-05-07 | Discharge: 2016-05-07 | Disposition: A | Discharge status: Home / Self Care | Payer: BLUE CROSS/BLUE SHIELD | Source: Ambulatory Visit | Attending: Radiation Oncology | Admitting: Radiation Oncology

## 2016-05-07 DIAGNOSIS — Z510 Encounter for antineoplastic radiation therapy: Secondary | ICD-10-CM | POA: Diagnosis not present

## 2016-05-07 IMAGING — RF DG UGI W/ GASTROGRAFIN
14 of 18 series · 14 of 18 positions shown · IV contrast (omnipaque)
Comparison: Abdominal series April 18, 2014

FLUOROSCOPY TIME:  0 min 52 seconds

CLINICAL DATA: Esophageal malignancy, high NG output, nausea
opening G-tube is clamped

EXAM:
WATER SOLUBLE UPPER GI SERIES
TECHNIQUE: Single-column upper GI series was performed using water soluble
contrast.
CONTRAST:  20mL OMNIPAQUE IOHEXOL 300 MG/ML  SOLN

[Series 1: run · 1 of 1 slices shown (1 of 13)]
[im 1/1]
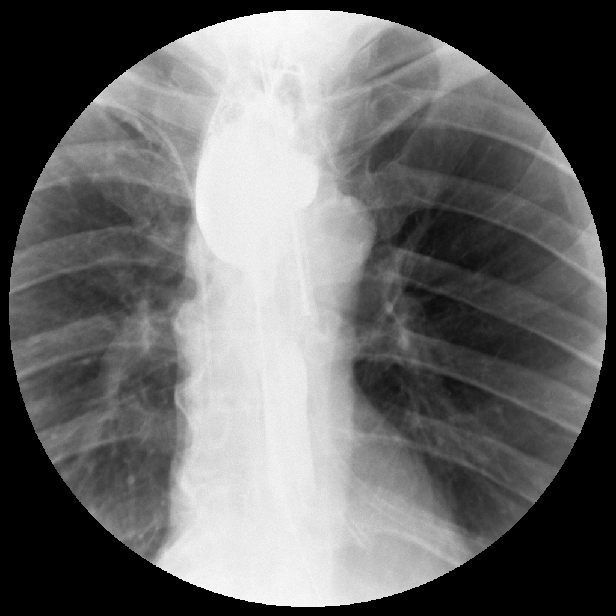

[Series 1: run · 1 of 1 slices shown (2 of 13)]
[im 1/1]
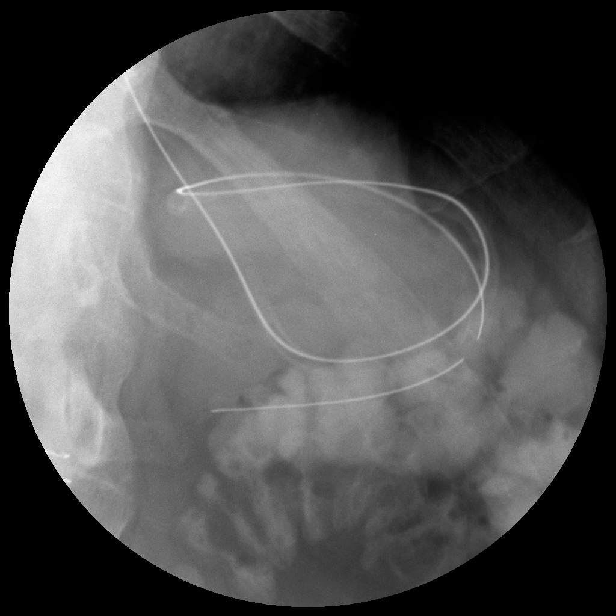

[Series 2: run · 1 of 1 slices shown (3 of 13)]
[im 1/1]
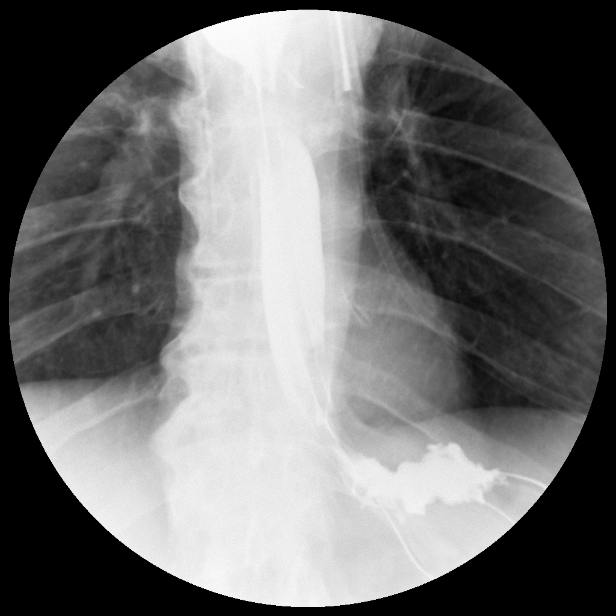

[Series 3: run · 1 of 1 slices shown (4 of 13)]
[im 1/1]
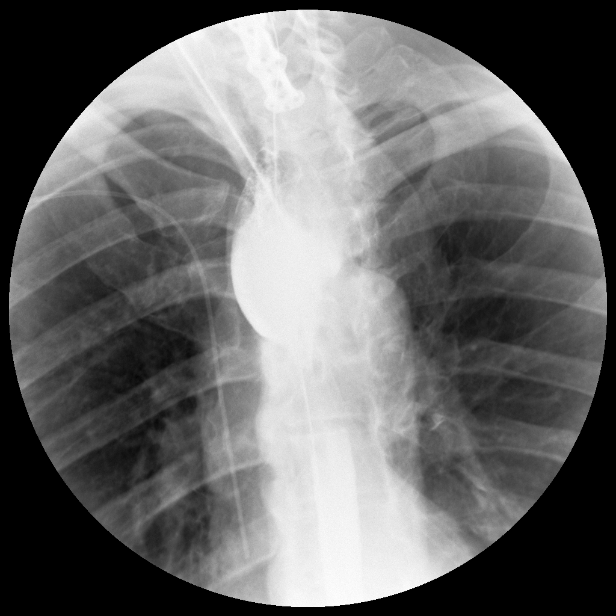

[Series 3: run · 1 of 1 slices shown (5 of 13)]
[im 1/1]
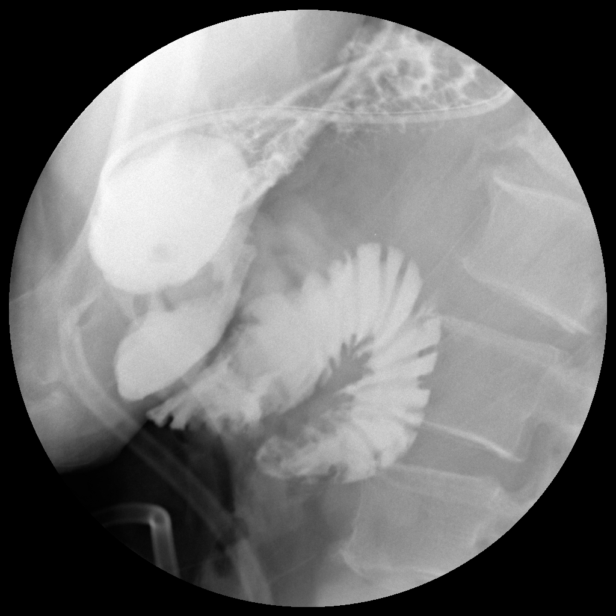

[Series 4: run · 1 of 1 slices shown (6 of 13)]
[im 1/1]
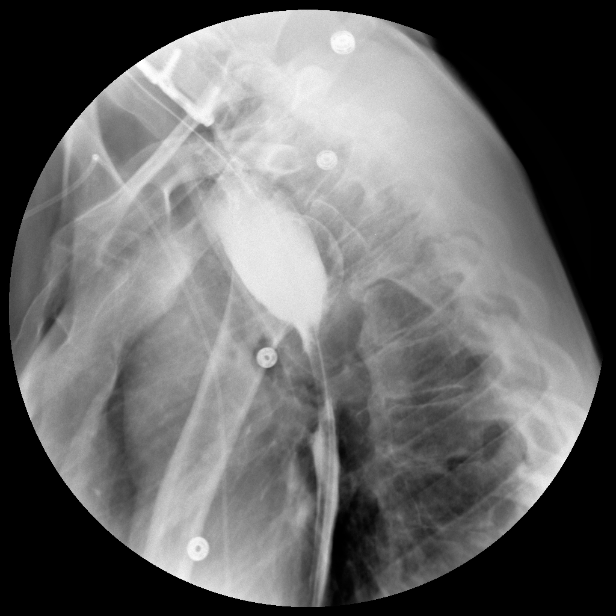

[Series 5: run · 1 of 1 slices shown (7 of 13)]
[im 1/1]
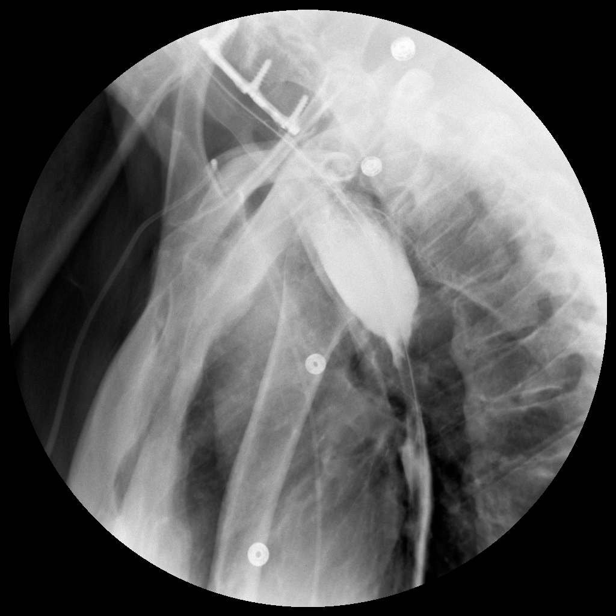

[Series 5: run · 1 of 1 slices shown (8 of 13)]
[im 1/1]
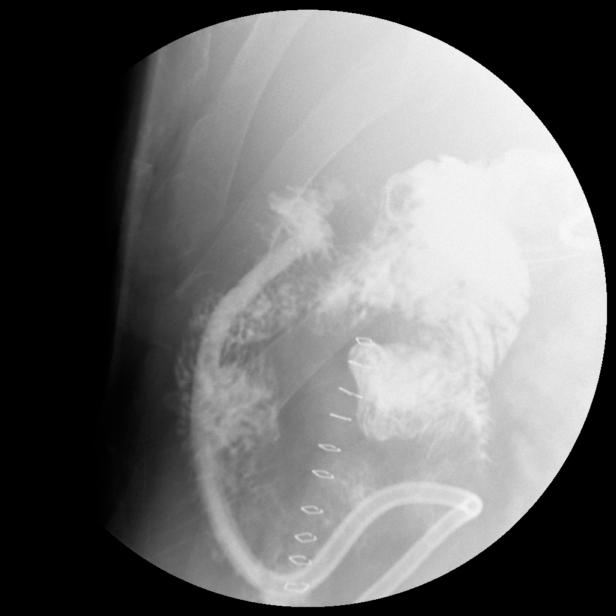

[Series 6: run · 1 of 1 slices shown (9 of 13)]
[im 1/1]
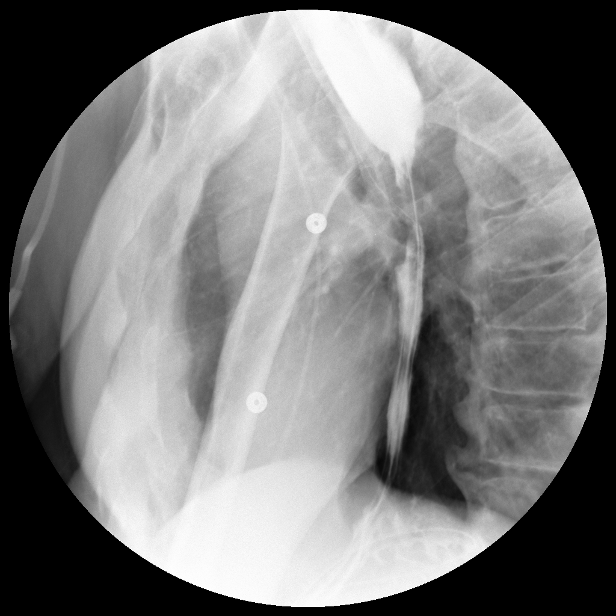

[Series 7: run · 1 of 1 slices shown (10 of 13)]
[im 1/1]
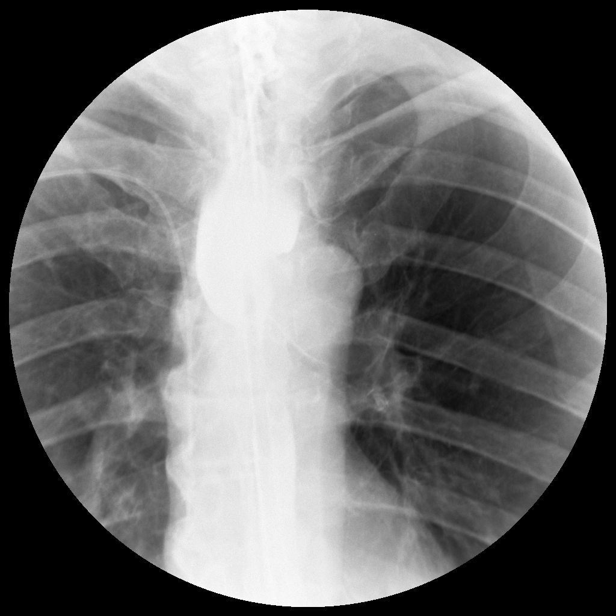

[Series 7: run · 1 of 1 slices shown (11 of 13)]
[im 1/1]
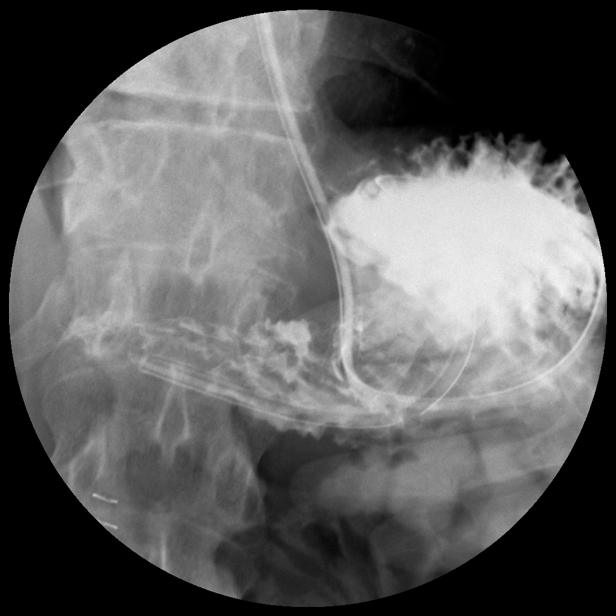

[Series 8: run · 1 of 1 slices shown (12 of 13)]
[im 1/1]
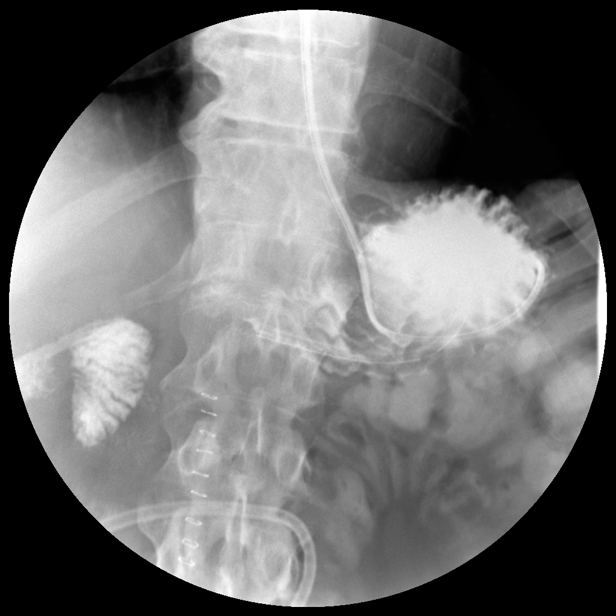

[Series 9: run · 1 of 1 slices shown (13 of 13)]
[im 1/1]
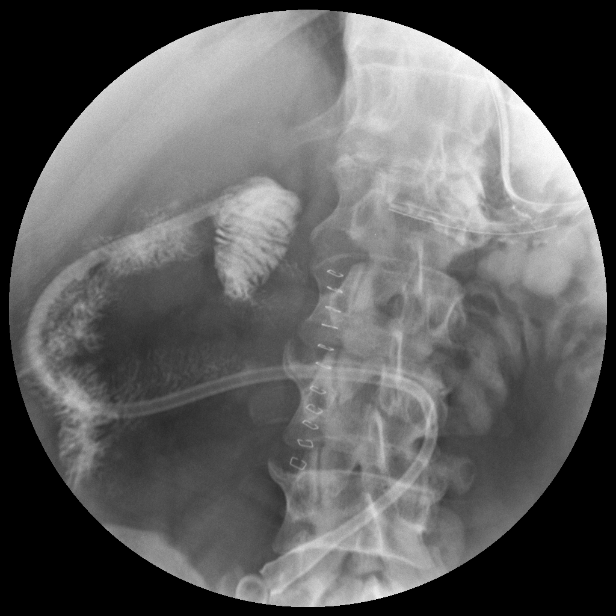

[Series 1001: view not recorded · 0.20mm/px · 1 of 1 slices shown]
[im 1/1]
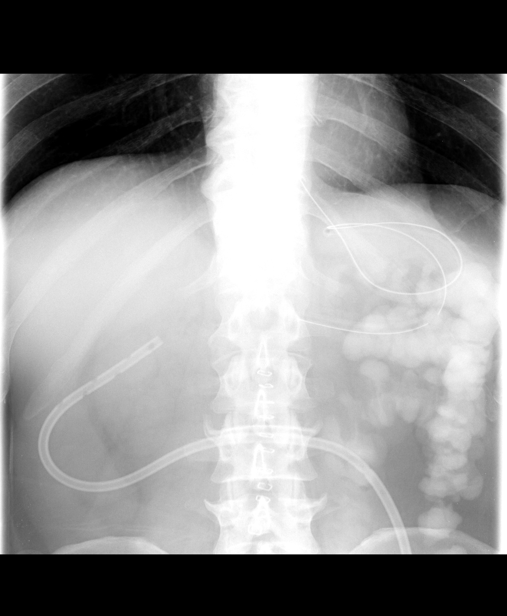

[14 of 18 positions shown; findings below may reference images not displayed]

FINDINGS: A nasogastric tube and jejunal tube are present. Omnipaque 300 was
ingested orally initially. High-grade obstruction in the proximal
thoracic esophagus was demonstrated with a small amount of contrast
passing distally surrounding the nasogastric tube. The distal
esophagus was unremarkable.

Subsequently 60 cc of contrast was instilled through the nasogastric
tube. The gastric lumen distended reasonably well. Thickening of the
mucosal fold pattern was noted. Gastric emptying into the duodenum
was prompt. The duodenum appeared normal in caliber. The contrast
passed through the duodenum into the proximal jejunum, surrounded
the jejunal tube tip, and passed even more distally. The jejunal
mucosal folds were normal. There was no evidence of retained jejunal
fluid.
IMPRESSION: 1. There is known high-grade obstruction in the proximal third of
the esophagus. A small amount of barium did pass around the
nasogastric tube. The visualized portions of the mid and distal
esophagus were unremarkable.
2. The stomach, duodenum, and proximal jejunum did not appear
abnormally dilated nor was are significant residual fluid present.
The contrast administered through the nasogastric tube passed
promptly from the stomach, through the duodenum, and into the
proximal to mid jejunum.

## 2016-05-07 NOTE — Progress Notes (Signed)
I saw him as an inpt consult on 04/30/16. Where they're scheduled in [place] or Epic unfortunately doesn't really mean anything. That's why I've tried to make sure I put the date I see them in the billing notes.

## 2016-05-08 ENCOUNTER — Ambulatory Visit
Admission: RE | Admit: 2016-05-08 | Discharge: 2016-05-08 | Disposition: A | Discharge status: Home / Self Care | Payer: BLUE CROSS/BLUE SHIELD | Source: Ambulatory Visit | Attending: Radiation Oncology | Admitting: Radiation Oncology

## 2016-05-08 DIAGNOSIS — Z510 Encounter for antineoplastic radiation therapy: Secondary | ICD-10-CM | POA: Diagnosis not present

## 2016-05-09 ENCOUNTER — Ambulatory Visit
Admission: RE | Admit: 2016-05-09 | Discharge: 2016-05-09 | Disposition: A | Discharge status: Home / Self Care | Payer: BLUE CROSS/BLUE SHIELD | Source: Ambulatory Visit | Attending: Radiation Oncology | Admitting: Radiation Oncology

## 2016-05-09 DIAGNOSIS — Z510 Encounter for antineoplastic radiation therapy: Secondary | ICD-10-CM | POA: Diagnosis not present

## 2016-05-10 ENCOUNTER — Ambulatory Visit
Admission: RE | Admit: 2016-05-10 | Discharge: 2016-05-10 | Disposition: A | Discharge status: Home / Self Care | Payer: BLUE CROSS/BLUE SHIELD | Source: Ambulatory Visit | Attending: Radiation Oncology | Admitting: Radiation Oncology

## 2016-05-10 ENCOUNTER — Encounter: Payer: Self-pay | Admitting: Radiation Oncology

## 2016-05-10 VITALS — BP 109/73 | HR 75 | Temp 97.9°F | Resp 20 | Wt 197.2 lb

## 2016-05-10 DIAGNOSIS — Z510 Encounter for antineoplastic radiation therapy: Secondary | ICD-10-CM | POA: Diagnosis not present

## 2016-05-10 DIAGNOSIS — C7951 Secondary malignant neoplasm of bone: Secondary | ICD-10-CM

## 2016-05-10 NOTE — Progress Notes (Addendum)
The patient  Department of Radiation Oncology  Phone:  [phone] Fax:        [phone]  Weekly Treatment Note    Name: Billy Romero Date: 05/10/2016 MRN: [id number] DOB: 22-Apr-1956   Diagnosis:     ICD-9-CM ICD-10-CM   1. 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 baclofen (LIORESAL) 10 MG tablet Take 10 mg by mouth 3 (three) times a week.     Marland Kitchen dexamethasone (DECADRON) 4 MG tablet Take 1 tablet (4 mg total) by mouth 2 (two) times daily. 40 tablet 0  . diclofenac sodium (VOLTAREN) 1 % GEL Apply 4 g topically 4 (four) times daily as needed (pain).   2  . esomeprazole (NEXIUM) 20 MG capsule Take 20 mg by mouth daily at 12 noon.    Marland Kitchen HYDROcodone-acetaminophen (NORCO/VICODIN) 5-325 MG tablet Take 1 tablet by mouth 3 (three) times daily as needed for moderate pain. for pain 60 tablet 0  . meloxicam (MOBIC) 15 MG tablet Take 15 mg by mouth 3 (three) times a week.     . traMADol (ULTRAM) 50 MG tablet Take 50 mg by mouth every 6 (six) hours as needed.  0   No current facility-administered medications for this encounter.      ALLERGIES: Patient has no known allergies.   LABORATORY DATA:  Lab Results  Component Value Date   WBC 11.0 (H) 05/01/2016   HGB 13.7 05/01/2016   HCT 40.3 05/01/2016   MCV 85.0 05/01/2016   PLT 181 05/01/2016   Lab Results  Component Value Date   NA 138 05/01/2016   K 4.4 05/01/2016   CL 105 05/01/2016   CO2 25 05/01/2016   Lab Results  Component Value Date   ALT 16 03/20/2016   AST 21 03/20/2016   ALKPHOS 83 03/20/2016   BILITOT 0.71 03/20/2016     NARRATIVE: [name] was seen today for weekly treatment management. The chart was checked and the patient's films were reviewed.  Weekly rad tx right orbit and cranium, no skin changes, patient wears black patch over right eye, no nausea, does have bluury  vision in right eye, had small nose bled on tissue right nostril this am, metallic taste,  Taking decadron '4mg'$  bid, no thrush noted, appetite has increased,  No pain or fatigue 6:34 PM BP 109/73 (BP Location: Left Arm, Patient Position: Sitting, Cuff Size: Normal)   Pulse 75   Temp 97.9 F (36.6 C) (Oral)   Resp 20   Wt 197 lb 3.2 oz (89.4 kg)   BMI 27.50 kg/m   Wt Readings from Last 3 Encounters:  05/10/16 197 lb 3.2 oz (89.4 kg)  05/03/16 201 lb 6.4 oz (91.4 kg)  04/30/16 198 lb 4.8 oz (89.9 kg)    PHYSICAL EXAMINATION: weight is 197 lb 3.2 oz (89.4 kg). His oral temperature is 97.9 F (36.6 C). His blood pressure is 109/73 and his pulse is 75. His respiration is 20.        ASSESSMENT: The patient is doing satisfactorily with treatment. The patient was given instructions in terms of tapering his Decadron. Beginning next Monday she will begin 4 mg once a day and do this for one week. The following Monday he will decrease this to 2 mg once a day. He will then discontinue Decadron after one week at this dose level.  The patient states there has not been a significant change in his vision. We discussed the timeframe of potential improvement and I remain hopeful that he will enjoy some noticeable benefit. He had one episode where he felt like he was close to focusing.  PLAN: We will continue with the patient's radiation treatment as planned.

## 2016-05-10 NOTE — Progress Notes (Signed)
Weekly rad tx right orbit and cranium, no skin changes, patient wears black patch over right eye, no nausea, does have bluury vision in right eye, had small nose bled on tissue right nostril this am, metallic taste,  Taking decadron '4mg'$  bid, no thrush noted, appetite has increased,  No pain or fatigue 10:48 AM BP 109/73 (BP Location: Left Arm, Patient Position: Sitting, Cuff Size: Normal)   Pulse 75   Temp 97.9 F (36.6 C) (Oral)   Resp 20   Wt 197 lb 3.2 oz (89.4 kg)   BMI 27.50 kg/m   Wt Readings from Last 3 Encounters:  05/10/16 197 lb 3.2 oz (89.4 kg)  05/03/16 201 lb 6.4 oz (91.4 kg)  04/30/16 198 lb 4.8 oz (89.9 kg)

## 2016-05-13 ENCOUNTER — Ambulatory Visit
Admission: RE | Admit: 2016-05-13 | Discharge: 2016-05-13 | Disposition: A | Discharge status: Home / Self Care | Payer: BLUE CROSS/BLUE SHIELD | Source: Ambulatory Visit | Attending: Radiation Oncology | Admitting: Radiation Oncology

## 2016-05-13 DIAGNOSIS — Z510 Encounter for antineoplastic radiation therapy: Secondary | ICD-10-CM | POA: Diagnosis not present

## 2016-05-14 ENCOUNTER — Ambulatory Visit (HOSPITAL_BASED_OUTPATIENT_CLINIC_OR_DEPARTMENT_OTHER): Payer: BLUE CROSS/BLUE SHIELD | Admitting: Nurse Practitioner

## 2016-05-14 ENCOUNTER — Ambulatory Visit
Admission: RE | Admit: 2016-05-14 | Discharge: 2016-05-14 | Disposition: A | Discharge status: Home / Self Care | Payer: BLUE CROSS/BLUE SHIELD | Source: Ambulatory Visit | Attending: Radiation Oncology | Admitting: Radiation Oncology

## 2016-05-14 VITALS — BP 100/58 | HR 78 | Temp 98.2°F | Resp 18 | Ht 71.0 in | Wt 200.0 lb

## 2016-05-14 DIAGNOSIS — C771 Secondary and unspecified malignant neoplasm of intrathoracic lymph nodes: Secondary | ICD-10-CM | POA: Diagnosis not present

## 2016-05-14 DIAGNOSIS — C7801 Secondary malignant neoplasm of right lung: Secondary | ICD-10-CM

## 2016-05-14 DIAGNOSIS — Z510 Encounter for antineoplastic radiation therapy: Secondary | ICD-10-CM | POA: Diagnosis not present

## 2016-05-14 DIAGNOSIS — C7951 Secondary malignant neoplasm of bone: Secondary | ICD-10-CM

## 2016-05-14 DIAGNOSIS — H532 Diplopia: Secondary | ICD-10-CM

## 2016-05-14 DIAGNOSIS — C154 Malignant neoplasm of middle third of esophagus: Secondary | ICD-10-CM | POA: Diagnosis not present

## 2016-05-14 NOTE — Progress Notes (Addendum)
[facility] OFFICE PROGRESS NOTE   Diagnosis:  Esophagus cancer  INTERVAL HISTORY:   [name] returns as scheduled. He completed another cycle of Pembrolizumab 04/10/2016. He was hospitalized 04/30/2016 following a syncopal episode. He reported diplopia. Brain CT showed multifocal areas of cranial enhancement compatible with osseous metastases; prominent metastasis along the greater wing of the right sphenoid with encroachment into the right orbit, mass effect on the lateral and superior semicircular canals and right-sided proptosis. Enhancement extends posteriorly into the right cavernous sinus potentially along the right third cranial nerve. There was narrowing of the right optic canal. There was a large metastasis along the vertex extending through the calvarium and into the scalp with inferior displacement of the superior sagittal sinus but no definite sinus obstruction. There were no focal brain metastases. He was started on steroids and a course of palliative radiation. He was discharged 05/01/2016.  He continues to note diplopia when he is not wearing the right eye patch. He has a right-sided headache. No nausea or vomiting. Intermittent mild dysphagia. No constipation or diarrhea. No shortness of breath or cough. No further syncopal events.  Objective:  Vital signs in last 24 hours:  Blood pressure (!) 100/58, pulse 78, temperature 98.2 F (36.8 C), temperature source Oral, resp. rate 18, height _0  (1.803 m), weight 200 lb (90.7 kg), SpO2 100 %.    HEENT: No thrush or ulcers. Resp: Lungs clear bilaterally. Cardio: Regular rate and rhythm. GI: Abdomen soft and nontender. No hepatomegaly. Vascular: No leg edema. Neuro: Alert and oriented. Right eye deviates medially.    Lab Results:  Lab Results  Component Value Date   WBC 11.0 (H) 05/01/2016   HGB 13.7 05/01/2016   HCT 40.3 05/01/2016   MCV 85.0 05/01/2016   PLT 181 05/01/2016   NEUTROABS 4.3  04/30/2016    Imaging:  No results found.  Medications: I have reviewed the patient's current medications.  Assessment/Plan: 1. 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 Radiation completed 06/10/2014  PET scan 07/01/2014 revealed improvement in the hypermetabolic esophagus mass and mediastinal lymphadenopathy  upper endoscopy 04/20/2015 -mid esophagus with mild narrowing , 2 small nodules measuring approximate 5 mm -biopsy with scant and detached fragments with high-grade squamous dysplasia , brushings negative for malignant cells.  CT chest/abdomen/pelvis 05/25/2015 with development of bilateral pulmonary nodules; paratracheal and subcarinal nodes improved/similar; new small lower paraesophageal nodes which are indeterminate; esophageal wall thickening improved since the prior PET; no evidence of metastatic disease in the abdomen or pelvis.  PET scan 06/16/2015 revealed hypermetabolic lung nodules, hypermetabolic mediastinal nodes, hypermetabolism at the mid to distal esophagus has improved  bronchoscopy/EBUS on 07/04/2015 confirmed squamous cell carcinoma involving right lower lobe brushings, right bronchial washings, a level 10 R lymph node biopsy, and right lower lobe biopsy  PD-L1 no expression on the right lower lobe biopsy (less than 1%)  Cycle 1 Pembrolizumab 07/26/2015  Cycle 2 Pembrolizumab 08/16/2015  Cycle 3 Pembrolizumab 09/06/2015  Cycle 4 Pembrolizumab 09/27/2015  CT chest 10/11/2015-stable/decreased size of pulmonary nodules, enlargement of several mediastinal nodes  Cycle 5 Pembrolizumab 10/18/2015  Cycle 6 Pembrolizumab 11/08/2015    Cycle 7 Pembrolizumab08/05/2015  Cycle 8 Pembrolizumab08/23/2017  CT chest 01/15/2016-slight progression of mediastinal/upper abdominal lymphadenopathy and enlargement  of a right lower lobe nodule  Cycle 9 Pembrolizumab 01/17/2016   Cycle 10 Pembrolizumab10/02/2016  Cycle 11 Pembrolizumab 02/28/2016  Cycle 12 Pembrolizumab 03/20/2016  CT chest 04/25/2016-minimal progression of chest lymphadenopathy and lung nodules, thickening at the mid thoracic esophagus  MRI 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 Solid/liquid dysphagia secondary to #1  Jejunostomy feeding tube placement 04/07/2014  Feeding tube removed 05/30/2014.  3. Small bowel obstruction following placement of the jejunostomy feeding tube-resolved  4. History of ulcerative colitis  5. Aspiration pneumonia December 2015  6. 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 He developed disease progression on Pembrolizumab. Pembrolizumab has been discontinued. He recently presented with a syncopal episode and diplopia and was found to have 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 with involvement of the right orbit. He is currently completing a course of palliative radiation.  Dr. Benay Spice recommends a referral to Dr. Fanny Skates at [facility] for clinical trial availability. We will schedule a return visit here in 2-3 weeks. He will contact the office in the interim with any problems.  Patient seen with Dr. Benay Spice.    Ned Card ANP/GNP-BC   05/14/2016  2:18 PM  This was a shared visit with Ned Card. Billy Romero was interviewed and examined. He will complete palliative radiation to the skull based metastases  tomorrow. The diplopia has not improved. I will refer him to Dr. Fanny Skates to discuss clinical trial and standard chemotherapy treatment options.  Julieanne Manson, M.D.

## 2016-05-15 ENCOUNTER — Ambulatory Visit
Admission: RE | Admit: 2016-05-15 | Discharge: 2016-05-15 | Disposition: A | Discharge status: Home / Self Care | Payer: BLUE CROSS/BLUE SHIELD | Source: Ambulatory Visit | Attending: Radiation Oncology | Admitting: Radiation Oncology

## 2016-05-15 DIAGNOSIS — Z510 Encounter for antineoplastic radiation therapy: Secondary | ICD-10-CM | POA: Diagnosis not present

## 2016-05-19 ENCOUNTER — Telehealth: Payer: Self-pay | Admitting: Oncology

## 2016-05-19 NOTE — Telephone Encounter (Signed)
S/w pt, gave appt for 2/2 @ 9.30am.

## 2016-05-20 ENCOUNTER — Encounter: Payer: Self-pay | Admitting: Radiation Oncology

## 2016-05-20 NOTE — Progress Notes (Signed)
  Radiation Oncology         [phone] ________________________________  Name: Billy Romero MRN: [id number]  Date: 05/20/2016  DOB: 08-26-1955  End of Treatment Note  Diagnosis: Bone metastasis   Indication for treatment:  Curative       Radiation treatment dates:   05/02/16 - 05/15/16  Site/dose:   Right Orbit treated to 30 Gy in 10 fractions. Cranium treated to 30 Gy in 10 fractions.  Beams/energy:   Right Orbit : 3D  //  6X        Cranium : 3D  //  6X  Narrative: The patient tolerated radiation treatment relatively well. The patient reported small right-sided nosebleeds occasionally throughout treatment. The patient did report some blurry vision is his right eye. He denied pain or fatigue throughout.   Plan: The patient has completed radiation treatment. I anticipate the patient's blurry vision will improve and he will enjoy some noticeable benefit from treatment. Additionally, the patient was given instructions on tapering his Decadron. The patient will return to radiation oncology clinic for routine followup in one month. I advised them to call or return sooner if they have any questions or concerns related to their recovery or treatment.  ------------------------------------------------  Jodelle Gross, MD, PhD  This document serves as a record of services personally performed by Kyung Rudd, MD. It was created on his behalf by Maryla Morrow, a trained medical scribe. The creation of this record is based on the scribe's personal observations and the provider's statements to them. This document has been checked and approved by the attending provider.

## 2016-05-27 ENCOUNTER — Ambulatory Visit (HOSPITAL_BASED_OUTPATIENT_CLINIC_OR_DEPARTMENT_OTHER): Payer: BLUE CROSS/BLUE SHIELD | Admitting: Nurse Practitioner

## 2016-05-27 ENCOUNTER — Telehealth: Payer: Self-pay | Admitting: *Deleted

## 2016-05-27 ENCOUNTER — Other Ambulatory Visit: Payer: Self-pay | Admitting: *Deleted

## 2016-05-27 ENCOUNTER — Other Ambulatory Visit (HOSPITAL_COMMUNITY)
Admission: RE | Admit: 2016-05-27 | Discharge: 2016-05-27 | Disposition: A | Discharge status: Home / Self Care | Payer: BLUE CROSS/BLUE SHIELD | Source: Ambulatory Visit | Attending: Oncology | Admitting: Oncology

## 2016-05-27 ENCOUNTER — Other Ambulatory Visit (HOSPITAL_COMMUNITY): Admission: RE | Admit: 2016-05-27 | Payer: Self-pay | Source: Ambulatory Visit

## 2016-05-27 ENCOUNTER — Ambulatory Visit (HOSPITAL_COMMUNITY)
Admission: RE | Admit: 2016-05-27 | Discharge: 2016-05-27 | Disposition: A | Discharge status: Home / Self Care | Payer: BLUE CROSS/BLUE SHIELD | Source: Ambulatory Visit | Attending: Oncology | Admitting: Oncology

## 2016-05-27 VITALS — BP 125/82 | HR 106 | Temp 101.4°F | Resp 20

## 2016-05-27 DIAGNOSIS — C7801 Secondary malignant neoplasm of right lung: Secondary | ICD-10-CM | POA: Diagnosis not present

## 2016-05-27 DIAGNOSIS — R509 Fever, unspecified: Secondary | ICD-10-CM | POA: Insufficient documentation

## 2016-05-27 DIAGNOSIS — J111 Influenza due to unidentified influenza virus with other respiratory manifestations: Secondary | ICD-10-CM

## 2016-05-27 DIAGNOSIS — C154 Malignant neoplasm of middle third of esophagus: Secondary | ICD-10-CM

## 2016-05-27 DIAGNOSIS — R918 Other nonspecific abnormal finding of lung field: Secondary | ICD-10-CM | POA: Diagnosis not present

## 2016-05-27 DIAGNOSIS — R05 Cough: Secondary | ICD-10-CM | POA: Insufficient documentation

## 2016-05-27 DIAGNOSIS — C771 Secondary and unspecified malignant neoplasm of intrathoracic lymph nodes: Secondary | ICD-10-CM | POA: Diagnosis not present

## 2016-05-27 LAB — INFLUENZA PANEL BY PCR (TYPE A & B)
INFLBPCR: NEGATIVE
Influenza A By PCR: POSITIVE — AB

## 2016-05-27 MED ORDER — SODIUM CHLORIDE 0.9 % IV SOLN
INTRAVENOUS | Status: AC
  Administered 2016-05-27: 14:00:00 via INTRAVENOUS

## 2016-05-27 MED ORDER — LEVOFLOXACIN IN D5W 500 MG/100ML IV SOLN
500.0000 mg | Freq: Once | INTRAVENOUS | Status: AC
  Administered 2016-05-27: 500 mg via INTRAVENOUS
  Filled 2016-05-27: qty 100

## 2016-05-27 MED ORDER — LEVOFLOXACIN 500 MG PO TABS: 500.0000 mg | ORAL_TABLET | Freq: Every day | ORAL | 0 refills | Status: DC

## 2016-05-27 MED ORDER — OSELTAMIVIR PHOSPHATE 75 MG PO CAPS: 75.0000 mg | ORAL_CAPSULE | Freq: Two times a day (BID) | ORAL | 0 refills | Status: DC

## 2016-05-27 NOTE — Progress Notes (Addendum)
[facility] OFFICE PROGRESS NOTE   Diagnosis:  Esophagus cancer  INTERVAL HISTORY:   Billy Romero returns prior to scheduled follow-up do to concern he has pneumonia. He reports a cough for the past 2 weeks. The cough has significantly worsened over the past 3 days. The cough is intermittently productive. He states that he feels "terrible". He has shortness of breath. Temperature this morning was 101.8. He took some Tylenol. He is having chills. He reports "sinus drainage". He feels very weak. He reports he nearly "passed out" while in radiology this morning.  Objective:  Vital signs in last 24 hours:  Blood pressure 125/82, pulse (!) 106, temperature (!) 101.4 F (38.6 C), temperature source Oral, resp. rate 20, SpO2 91 %.    HEENT: No thrush or ulcers. Mucous membranes are moist. Resp: Lungs clear bilaterally. No respiratory distress. Cardio: Regular, tachycardic. GI: Abdomen soft and nontender. No hepatomegaly. Vascular: No leg edema. Neuro: Alert and oriented.    Lab Results:  Lab Results  Component Value Date   WBC 11.0 (H) 05/01/2016   HGB 13.7 05/01/2016   HCT 40.3 05/01/2016   MCV 85.0 05/01/2016   PLT 181 05/01/2016   NEUTROABS 4.3 04/30/2016    Imaging:  Dg Chest 2 View  Result Date: 05/27/2016 CLINICAL DATA:  Cough and fever EXAM: CHEST  2 VIEW COMPARISON:  PA and lateral chest x-ray of July 04, 2015 FINDINGS: The lungs are adequately inflated. Subtle increased lung markings are noted in the left lower lobe which is accentuated by overlapping osteophyte in the lower thoracic spine. The right lung is clear. The heart and pulmonary vascularity are normal. The mediastinum is normal in width. There is multilevel degenerative disc disease of the thoracic spine with calcification in the a jejunal longitudinal ligament. IMPRESSION: Hazy increased density in the left lower lobe may reflect subsegmental atelectasis or early pneumonia. Followup PA and  lateral chest X-ray is recommended in 3-4 weeks following trial of antibiotic therapy to ensure resolution. Electronically Signed   By: David  Martinique M.D.   On: 05/27/2016 11:20    Medications: I have reviewed the patient's current medications.  Assessment/Plan: 1. 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 Radiation completed 06/10/2014  PET scan 07/01/2014 revealed improvement in the hypermetabolic esophagus mass and mediastinal lymphadenopathy  upper endoscopy 04/20/2015 -mid esophagus with mild narrowing , 2 small nodules measuring approximate 5 mm -biopsy with scant and detached fragments with high-grade squamous dysplasia , brushings negative for malignant cells.  CT chest/abdomen/pelvis 05/25/2015 with development of bilateral pulmonary nodules; paratracheal and subcarinal nodes improved/similar; new small lower paraesophageal nodes which are indeterminate; esophageal wall thickening improved since the prior PET; no evidence of metastatic disease in the abdomen or pelvis.  PET scan 06/16/2015 revealed hypermetabolic lung nodules, hypermetabolic mediastinal nodes, hypermetabolism at the mid to distal esophagus has improved  bronchoscopy/EBUS on 07/04/2015 confirmed squamous cell carcinoma involving right lower lobe brushings, right bronchial washings, a level 10 R lymph node biopsy, and right lower lobe biopsy  PD-L1 no expression on the right lower lobe biopsy (less than 1%)  Cycle 1 Pembrolizumab 07/26/2015  Cycle 2 Pembrolizumab 08/16/2015  Cycle 3 Pembrolizumab 09/06/2015  Cycle 4 Pembrolizumab 09/27/2015  CT chest 10/11/2015-stable/decreased size of  pulmonary nodules, enlargement of several mediastinal nodes  Cycle 5 Pembrolizumab 10/18/2015  Cycle 6 Pembrolizumab 11/08/2015   Cycle 7 Pembrolizumab08/05/2015  Cycle 8 Pembrolizumab08/23/2017  CT chest 01/15/2016-slight progression of mediastinal/upper abdominal lymphadenopathy and enlargement of a right lower lobe nodule  Cycle 9 Pembrolizumab 01/17/2016   Cycle 10 Pembrolizumab10/02/2016  Cycle 11 Pembrolizumab 02/28/2016  Cycle 12 Pembrolizumab 03/20/2016  CT chest 04/25/2016-minimal progression of chest lymphadenopathy and lung nodules, thickening at the mid thoracic esophagus  MRI 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 Solid/liquid dysphagia secondary to #1  Jejunostomy feeding tube placement 04/07/2014  Feeding tube removed 05/30/2014.  3. Small bowel obstruction following placement of the jejunostomy feeding tube-resolved  4. History of ulcerative colitis  5. Aspiration pneumonia December 2015  6. 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 Fever/cough/dyspnea    Disposition: Billy Romero presents with a 2 to 3-day history of a worsening cough, dyspnea and fever. Chest x-ray shows a possible left lower lobe pneumonia. He will begin Levaquin 500 mg daily, first dose IV in the office today. We are giving him a liter of normal saline and will also swab for influenza. We will reevaluate later today. If he appears stable the plan is for outpatient treatment of pneumonia.    Ned Card ANP/GNP-BC   05/27/2016  5:07 PM   Addendum for 1642 PM-nasal swab returned positive for influenza A. He appears stable for outpatient treatment. He will complete a 7 day course of Tamiflu 75 mg twice daily. He will also complete a 10 day course of  Levaquin to cover for possible secondary bacterial pneumonia. He is scheduled to return for a follow-up visit on 05/31/2016. He understands to contact the office or seek emergency evaluation in the interim if there is worsening of the current symptoms despite treatment as outlined above. Repeat vital signs with temperature 101.4, heart rate 106, respirations 20, blood pressure 125/82, oxygen saturation 91%.  Patient seen with Dr. Benay Spice. 25 minutes were spent face-to-face at today's visit with the majority of that time involved in counseling/coordination of care.   This was a shared visit with Ned Card. Billy Romero was interviewed and examined. He presents today with a febrile illness. A nasal swab returned positive for influenza a. There is a subtle infiltrate in the left lung on chest x-ray. He will complete a course of Tamiflu and antibiotics. He felt better after receiving IV fluids in the office. We will call to check on him over the next few days to be sure he is improving.  He is scheduled to see Dr.Uronis next week.  Julieanne Manson, M.D.

## 2016-05-27 NOTE — Telephone Encounter (Signed)
Call from pt's wife reporting cough x2 days and temp 101. Discussed with Dr. Benay Spice: Order received for stat chest Xray, office visit today. Called wife with instructions, he will have chest xray first at [facility] then check in for office visit. She voiced understanding.

## 2016-05-28 ENCOUNTER — Telehealth: Payer: Self-pay | Admitting: *Deleted

## 2016-05-28 NOTE — Telephone Encounter (Signed)
Called pt for status update: He reports he feels better, cough persists but congestion seems to be improving. He understands to call office for worsening of symptoms.

## 2016-05-29 ENCOUNTER — Telehealth: Payer: Self-pay | Admitting: Oncology

## 2016-05-29 NOTE — Telephone Encounter (Signed)
Per 1/30 schedule message moved 2/2 f/u to 2/7. Spoke with patient he is aware.

## 2016-05-31 ENCOUNTER — Ambulatory Visit: Payer: BLUE CROSS/BLUE SHIELD | Admitting: Oncology

## 2016-06-05 ENCOUNTER — Telehealth: Payer: Self-pay | Admitting: Oncology

## 2016-06-05 ENCOUNTER — Ambulatory Visit (HOSPITAL_BASED_OUTPATIENT_CLINIC_OR_DEPARTMENT_OTHER): Payer: BLUE CROSS/BLUE SHIELD | Admitting: Oncology

## 2016-06-05 VITALS — BP 102/67 | HR 70 | Temp 98.5°F | Resp 18 | Ht 71.0 in | Wt 197.1 lb

## 2016-06-05 DIAGNOSIS — C154 Malignant neoplasm of middle third of esophagus: Secondary | ICD-10-CM | POA: Diagnosis not present

## 2016-06-05 DIAGNOSIS — C771 Secondary and unspecified malignant neoplasm of intrathoracic lymph nodes: Secondary | ICD-10-CM | POA: Diagnosis not present

## 2016-06-05 DIAGNOSIS — H532 Diplopia: Secondary | ICD-10-CM

## 2016-06-05 DIAGNOSIS — C7801 Secondary malignant neoplasm of right lung: Secondary | ICD-10-CM | POA: Diagnosis not present

## 2016-06-05 DIAGNOSIS — R05 Cough: Secondary | ICD-10-CM

## 2016-06-05 NOTE — Telephone Encounter (Signed)
Port placement scheduled for 06/11/16 with arrival time of 11:30 a.m. NPO after 7 a.m. Patient is aware. Chemo scheduled for next available 06/13/16, with Pump disconnect on 06/15/16. Appointments scheduled per, 06/06/16 los. Patient was given a copy of the AVS report and appointment schedule, per 06/06/16 los.

## 2016-06-05 NOTE — Progress Notes (Signed)
[facility] OFFICE PROGRESS NOTE   Diagnosis: Esophagus cancer  INTERVAL HISTORY:   [name] returns as scheduled. He was diagnosed with influenza A when we saw him on 05/27/2016. He was also treated for pneumonia. His symptoms have improved. No fever. He continues to have a cough. He also has "sinus "drainage. The diplopia has partially improved. He saw Dr. Fanny Skates on 06/03/2016. She recommends treatment with FOLFOX versus enrollment on a phase I study.  Objective:  Vital signs in last 24 hours:  Blood pressure 102/67, pulse 70, temperature 98.5 F (36.9 C), temperature source Oral, resp. rate 18, height 5' 11"  (1.803 m), weight 197 lb 1.6 oz (89.4 kg), SpO2 98 %.   Resp: Coarse rhonchi at the left posterior base, no respiratory distress Cardio: Regular rate and rhythm GI: No hepatomegaly, nontender, no mass Vascular: No leg edema Neuro: Mild weakness with lateral rotation at the right eye   Lab Results:  Lab Results  Component Value Date   WBC 11.0 (H) 05/01/2016   HGB 13.7 05/01/2016   HCT 40.3 05/01/2016   MCV 85.0 05/01/2016   PLT 181 05/01/2016   NEUTROABS 4.3 04/30/2016     Medications: I have reviewed the patient's current medications.  Assessment/Plan: 1. 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 Radiation completed 06/10/2014  PET scan 07/01/2014 revealed improvement in the hypermetabolic esophagus mass and mediastinal lymphadenopathy  upper endoscopy 04/20/2015 -mid esophagus with mild narrowing , 2 small nodules measuring approximate 5 mm -biopsy with scant and detached fragments with high-grade squamous dysplasia ,  brushings negative for malignant cells.  CT chest/abdomen/pelvis 05/25/2015 with development of bilateral pulmonary nodules; paratracheal and subcarinal nodes improved/similar; new small lower paraesophageal nodes which are indeterminate; esophageal wall thickening improved since the prior PET; no evidence of metastatic disease in the abdomen or pelvis.  PET scan 06/16/2015 revealed hypermetabolic lung nodules, hypermetabolic mediastinal nodes, hypermetabolism at the mid to distal esophagus has improved  bronchoscopy/EBUS on 07/04/2015 confirmed squamous cell carcinoma involving right lower lobe brushings, right bronchial washings, a level 10 R lymph node biopsy, and right lower lobe biopsy  PD-L1 no expression on the right lower lobe biopsy (less than 1%)  Cycle 1 Pembrolizumab 07/26/2015  Cycle 2 Pembrolizumab 08/16/2015  Cycle 3 Pembrolizumab 09/06/2015  Cycle 4 Pembrolizumab 09/27/2015  CT chest 10/11/2015-stable/decreased size of pulmonary nodules, enlargement of several mediastinal nodes  Cycle 5 Pembrolizumab 10/18/2015  Cycle 6 Pembrolizumab 11/08/2015   Cycle 7 Pembrolizumab08/05/2015  Cycle 8 Pembrolizumab08/23/2017  CT chest 01/15/2016-slight progression of mediastinal/upper abdominal lymphadenopathy and enlargement of a right lower lobe nodule  Cycle 9 Pembrolizumab 01/17/2016   Cycle 10 Pembrolizumab10/02/2016  Cycle 11 Pembrolizumab 02/28/2016  Cycle 12 Pembrolizumab 03/20/2016  CT chest 04/25/2016-minimal progression of chest lymphadenopathy and lung nodules, thickening at the mid thoracic esophagus  MRI 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 Solid/liquid dysphagia secondary to #1  Jejunostomy feeding tube placement 04/07/2014  Feeding tube removed 05/30/2014.  3. Small bowel  obstruction following placement of the jejunostomy feeding tube-resolved  4. History of ulcerative colitis  5. Aspiration pneumonia December 2015  6. Diplopia secondary  to a metastasis at the right sphenoid involving the right orbit  7. Fever/cough/dyspnea  04/30/2016-nasal swab positive for influenza A , chest x-ray with a left lower lobe  Pneumonia -treated with Tamiflu and Levaquin     Disposition:  [name] has experienced partial improvement in the diplopia following the palliative radiation. He has metastatic esophagus cancer. We discussed treatment options. He is considering enrollment on a phase I study at [facility]. This will require tissue testing. I recommend he began treatment with FOLFOX. We will refer him for placement of a Port-A-Cath. I reviewed the potential toxicities associated with FOLFOX regimen including the chance for nausea/vomiting, mucositis, alopecia, diarrhea, and hematologic toxicity. We discussed the rash, sun sensitivity, hyperpigmentation, and hand/foot syndrome associated with 5-fluorouracil. We reviewed the allergic reaction and various types of neuropathy seen with oxaliplatin. He agrees to proceed. He will be scheduled for a first cycle of FOLFOX on 06/12/2016. We will see him for an office visit that day. We will contact the GI oncology service at [facility] to discuss specifics of the phase I study.  25 minutes were spent with the patient today. The majority of the time was used for counseling and coordination of care.  Betsy Coder, MD  06/05/2016  2:33 PM

## 2016-06-06 ENCOUNTER — Other Ambulatory Visit: Payer: Self-pay | Admitting: Oncology

## 2016-06-06 DIAGNOSIS — Z7189 Other specified counseling: Secondary | ICD-10-CM | POA: Insufficient documentation

## 2016-06-07 ENCOUNTER — Telehealth: Payer: Self-pay | Admitting: *Deleted

## 2016-06-07 ENCOUNTER — Other Ambulatory Visit: Payer: Self-pay | Admitting: Radiology

## 2016-06-07 NOTE — Telephone Encounter (Signed)
Call from pt asking if Dr. Benay Spice recommends he proceed with clinical trial. He reports the "deadline" for consent is today. Will review with MD.

## 2016-06-07 NOTE — Telephone Encounter (Addendum)
Call placed back to patient to inform him per Dr. Benay Spice to call the research nurse at [facility] to inform her to proceed with testing his tissue and that we will plan to continue with FOLFOX at [facility] as scheduled.  Patient appreciative of call back and has no questions at this time.

## 2016-06-09 ENCOUNTER — Other Ambulatory Visit: Payer: Self-pay | Admitting: Oncology

## 2016-06-09 DIAGNOSIS — C154 Malignant neoplasm of middle third of esophagus: Secondary | ICD-10-CM

## 2016-06-10 ENCOUNTER — Other Ambulatory Visit: Payer: Self-pay | Admitting: Oncology

## 2016-06-10 NOTE — Progress Notes (Signed)
START OFF PATHWAY REGIMEN - Gastroesophageal  Off Pathway: FOLFOX (q14d)  [id number]:FOLFOX (q14d):   A cycle is every 14 days:     Oxaliplatin (Eloxatin(R)) 85 mg/m2 in 250 mL D5W IV over 2 hours day 1 Dose Mod: None     Leucovorin 400 mg/m2 in 250 mL D5W IV over 2 hours day 1 followed immediately by Dose Mod: None     5-Fluorouracil 400 mg/m2 IV bolus over 2-4 minutes day 1 Dose Mod: None     5-Fluorouracil 2,400 mg/m2 in _____mL NS IV as a 46 hour infusion day 1 Dose Mod: None Additional Orders: Schedule next Chemotherapy  **Always confirm dose/schedule in your pharmacy ordering system**    Patient Characteristics: Distant Metastases (cM1/pM1) / Locally Recurrent Disease, Squamous Cell, Esophageal & GE Junction, First Line, Prior Taxane or Taxane Contraindicated Histology: Squamous Cell Disease Classification: Esophageal Therapeutic Status: Distant Metastases (No Additional Staging) Line of therapy: First Line Would you be surprised if this patient died  in the next year? I would NOT be surprised if this patient died in the next year Taxane Status: Prior Taxane  Intent of Therapy: Non-Curative / Palliative Intent, Discussed with Patient

## 2016-06-11 ENCOUNTER — Other Ambulatory Visit: Payer: Self-pay | Admitting: Oncology

## 2016-06-11 ENCOUNTER — Encounter (HOSPITAL_COMMUNITY): Payer: Self-pay

## 2016-06-11 ENCOUNTER — Ambulatory Visit (HOSPITAL_COMMUNITY)
Admission: RE | Admit: 2016-06-11 | Discharge: 2016-06-11 | Disposition: A | Discharge status: Home / Self Care | Payer: BLUE CROSS/BLUE SHIELD | Source: Ambulatory Visit | Attending: Oncology | Admitting: Oncology

## 2016-06-11 DIAGNOSIS — C799 Secondary malignant neoplasm of unspecified site: Secondary | ICD-10-CM | POA: Insufficient documentation

## 2016-06-11 DIAGNOSIS — C159 Malignant neoplasm of esophagus, unspecified: Secondary | ICD-10-CM | POA: Insufficient documentation

## 2016-06-11 DIAGNOSIS — Z9221 Personal history of antineoplastic chemotherapy: Secondary | ICD-10-CM | POA: Insufficient documentation

## 2016-06-11 DIAGNOSIS — G8929 Other chronic pain: Secondary | ICD-10-CM | POA: Insufficient documentation

## 2016-06-11 DIAGNOSIS — M549 Dorsalgia, unspecified: Secondary | ICD-10-CM | POA: Insufficient documentation

## 2016-06-11 DIAGNOSIS — C154 Malignant neoplasm of middle third of esophagus: Secondary | ICD-10-CM

## 2016-06-11 DIAGNOSIS — K219 Gastro-esophageal reflux disease without esophagitis: Secondary | ICD-10-CM | POA: Insufficient documentation

## 2016-06-11 HISTORY — DX: Pneumonia, unspecified organism: J18.9

## 2016-06-11 HISTORY — PX: IR GENERIC HISTORICAL: IMG1180011

## 2016-06-11 LAB — CBC WITH DIFFERENTIAL/PLATELET
BASOS ABS: 0 10*3/uL (ref 0.0–0.1)
BASOS PCT: 0 %
Eosinophils Absolute: 0 10*3/uL (ref 0.0–0.7)
Eosinophils Relative: 0 %
HEMATOCRIT: 40.1 % (ref 39.0–52.0)
HEMOGLOBIN: 14 g/dL (ref 13.0–17.0)
LYMPHS PCT: 10 %
Lymphs Abs: 0.9 10*3/uL (ref 0.7–4.0)
MCH: 30 pg (ref 26.0–34.0)
MCHC: 34.9 g/dL (ref 30.0–36.0)
MCV: 85.9 fL (ref 78.0–100.0)
MONO ABS: 0.8 10*3/uL (ref 0.1–1.0)
Monocytes Relative: 10 %
NEUTROS ABS: 7 10*3/uL (ref 1.7–7.7)
NEUTROS PCT: 80 %
Platelets: 265 10*3/uL (ref 150–400)
RBC: 4.67 MIL/uL (ref 4.22–5.81)
RDW: 13 % (ref 11.5–15.5)
WBC: 8.7 10*3/uL (ref 4.0–10.5)

## 2016-06-11 LAB — PROTIME-INR
INR: 1.07
Prothrombin Time: 13.9 seconds (ref 11.4–15.2)

## 2016-06-11 MED ORDER — MIDAZOLAM HCL 2 MG/2ML IJ SOLN
INTRAMUSCULAR | Status: AC
  Filled 2016-06-11: qty 6

## 2016-06-11 MED ORDER — SODIUM CHLORIDE 0.9 % IV SOLN
INTRAVENOUS | Status: DC
  Administered 2016-06-11: 12:00:00 via INTRAVENOUS

## 2016-06-11 MED ORDER — HEPARIN SOD (PORK) LOCK FLUSH 100 UNIT/ML IV SOLN
INTRAVENOUS | Status: AC
  Filled 2016-06-11: qty 5

## 2016-06-11 MED ORDER — FENTANYL CITRATE (PF) 100 MCG/2ML IJ SOLN
INTRAMUSCULAR | Status: AC | PRN
  Administered 2016-06-11 (×3): 50 ug via INTRAVENOUS

## 2016-06-11 MED ORDER — FLUMAZENIL 0.5 MG/5ML IV SOLN
INTRAVENOUS | Status: AC
  Filled 2016-06-11: qty 5

## 2016-06-11 MED ORDER — NALOXONE HCL 0.4 MG/ML IJ SOLN
INTRAMUSCULAR | Status: AC
  Filled 2016-06-11: qty 1

## 2016-06-11 MED ORDER — MIDAZOLAM HCL 2 MG/2ML IJ SOLN
INTRAMUSCULAR | Status: AC | PRN
  Administered 2016-06-11 (×3): 1 mg via INTRAVENOUS

## 2016-06-11 MED ORDER — LIDOCAINE-EPINEPHRINE (PF) 2 %-1:200000 IJ SOLN
INTRAMUSCULAR | Status: AC
  Filled 2016-06-11: qty 20

## 2016-06-11 MED ORDER — FENTANYL CITRATE (PF) 100 MCG/2ML IJ SOLN
INTRAMUSCULAR | Status: AC
  Filled 2016-06-11: qty 6

## 2016-06-11 MED ORDER — CEFAZOLIN SODIUM-DEXTROSE 2-4 GM/100ML-% IV SOLN
2.0000 g | INTRAVENOUS | Status: AC
  Administered 2016-06-11: 2 g via INTRAVENOUS
  Filled 2016-06-11 (×2): qty 100

## 2016-06-11 MED ORDER — LIDOCAINE-EPINEPHRINE (PF) 2 %-1:200000 IJ SOLN
INTRAMUSCULAR | Status: AC | PRN
  Administered 2016-06-11: 10 mL

## 2016-06-11 NOTE — Procedures (Signed)
Pre Procedure Dx: Esophageal Cancer Post Procedural Dx: Same  Successful placement of right IJ approach port-a-cath with tip at the superior caval atrial junction. The catheter is ready for immediate use.  Estimated Blood Loss: Minimal  Complications: None immediate.  Ronny Bacon, MD Pager #: [phone]

## 2016-06-11 NOTE — Discharge Instructions (Signed)
Implanted Port Insertion, Care After Refer to this sheet in the next few weeks. These instructions provide you with information on caring for yourself after your procedure. Your health care provider may also give you more specific instructions. Your treatment has been planned according to current medical practices, but problems sometimes occur. Call your health care provider if you have any problems or questions after your procedure. WHAT TO EXPECT AFTER THE PROCEDURE After your procedure, it is typical to have the following:   Discomfort at the port insertion site. Ice packs to the area will help.  Bruising on the skin over the port. This will subside in 3-4 days. HOME CARE INSTRUCTIONS  After your port is placed, you will get a manufacturer's information card. The card has information about your port. Keep this card with you at all times.   Know what kind of port you have. There are many types of ports available.   Wear a medical alert bracelet in case of an emergency. This can help alert health care workers that you have a port.   The port can stay in for as long as your health care provider believes it is necessary.   A home health care nurse may give medicines and take care of the port.   You or a family member can get special training and directions for giving medicine and taking care of the port at home.  SEEK MEDICAL CARE IF:   Your port does not flush or you are unable to get a blood return.   You have a fever or chills. SEEK IMMEDIATE MEDICAL CARE IF:  You have new fluid or pus coming from your incision.   You notice a bad smell coming from your incision site.   You have swelling, pain, or more redness at the incision or port site.   You have chest pain or shortness of breath. This information is not intended to replace advice given to you by your health care provider. Make sure you discuss any questions you have with your health care provider. Document  Released: 02/03/2013 Document Revised: 04/20/2013 Document Reviewed: 02/03/2013 Elsevier Interactive Patient Education  2017 [place]. Moderate Conscious Sedation, Adult, Care After These instructions provide you with information about caring for yourself after your procedure. Your health care provider may also give you more specific instructions. Your treatment has been planned according to current medical practices, but problems sometimes occur. Call your health care provider if you have any problems or questions after your procedure. What can I expect after the procedure? After your procedure, it is common:  To feel sleepy for several hours.  To feel clumsy and have poor balance for several hours.  To have poor judgment for several hours.  To vomit if you eat too soon. Follow these instructions at home: For at least 24 hours after the procedure:   Do not:  Participate in activities where you could fall or become injured.  Drive.  Use heavy machinery.  Drink alcohol.  Take sleeping pills or medicines that cause drowsiness.  Make important decisions or sign legal documents.  Take care of children on your own.  Rest. Eating and drinking  Follow the diet recommended by your health care provider.  If you vomit:  Drink water, juice, or soup when you can drink without vomiting.  Make sure you have little or no nausea before eating solid foods. General instructions  Have a responsible adult stay with you until you are awake and alert.  Take over-the-counter and prescription medicines only as told by your health care provider.  If you smoke, do not smoke without supervision.  Keep all follow-up visits as told by your health care provider. This is important. Contact a health care provider if:  You keep feeling nauseous or you keep vomiting.  You feel light-headed.  You develop a rash.  You have a fever. Get help right away if:  You have trouble  breathing. This information is not intended to replace advice given to you by your health care provider. Make sure you discuss any questions you have with your health care provider. Document Released: 02/03/2013 Document Revised: 09/18/2015 Document Reviewed: 08/05/2015 Elsevier Interactive Patient Education  2017 [employer].

## 2016-06-11 NOTE — Consult Note (Signed)
Chief Complaint: Patient was seen in consultation today for port a cath placement  Referring Physician(s): Sherrill,Duey B  Supervising Physician: Sandi Mariscal  Patient Status: [facility] - Out-pt  History of Present Illness: Billy Romero is a 61 y.o. male with history of metastatic esophageal cancer (originally diagnosed 2015, prior chemorad) who presents today for port a cath placement for additional chemotherapy.  He is recently recovering from influenza A and pneumonia.  Past Medical History:  Diagnosis Date  . Chronic back pain   . Complication of anesthesia    WOKE UP DURING COLONOSCOPY  . Esophageal cancer ([place]) 03/29/14  . GERD (gastroesophageal reflux disease)   . Pneumonia    May 27, 2016, was + for flu also  . Primary cancer of esophagus with metastasis to other site [facility]) 07/04/2015    Past Surgical History:  Procedure Laterality Date  . APPENDECTOMY    . CERVICAL FUSION    . EYE SURGERY     PTEGERIUM   BIL EYES  . HAND SURGERY     DUPRAGENS CONTRACTURE  RT HAND  . KNEE ARTHROSCOPY     LEFT  . PEG PLACEMENT N/A 04/06/2014   Procedure: OPEN JEJUNOSTOMY TUBE PLACEMENT;  Surgeon: Armandina Gemma, MD;  Location: [facility] ORS;  Service: General;  Laterality: N/A;  . VIDEO BRONCHOSCOPY WITH ENDOBRONCHIAL ULTRASOUND N/A 07/04/2015   Procedure: VIDEO BRONCHOSCOPY WITH ENDOBRONCHIAL ULTRASOUND with brushings and biopsies.;  Surgeon: Grace Isaac, MD;  Location: [facility] OR;  Service: Thoracic;  Laterality: N/A;    Allergies: Patient has no known allergies.  Medications: Prior to Admission medications   Medication Sig Start Date End Date Taking? Authorizing Provider  baclofen (LIORESAL) 10 MG tablet Take 10 mg by mouth 3 (three) times a week.  01/12/15  Yes Historical Provider, MD  dexamethasone (DECADRON) 4 MG tablet Take 1 tablet (4 mg total) by mouth 2 (two) times daily. 05/02/16  Yes Hayden Pedro, PA-C  diclofenac sodium (VOLTAREN) 1 % GEL Apply 4 g topically 4 (four)  times daily as needed (pain).  03/27/16  Yes Historical Provider, MD  esomeprazole (NEXIUM) 20 MG capsule Take 20 mg by mouth daily at 12 noon.   Yes Historical Provider, MD  HYDROcodone-acetaminophen (NORCO/VICODIN) 5-325 MG tablet Take 1 tablet by mouth 3 (three) times daily as needed for moderate pain. for pain 12/20/15  Yes Ladell Pier, MD  meloxicam (MOBIC) 15 MG tablet Take 15 mg by mouth 3 (three) times a week.  01/09/15  Yes Historical Provider, MD  levofloxacin (LEVAQUIN) 500 MG tablet Take 1 tablet (500 mg total) by mouth daily. Begin on 1/30 05/28/16   Owens Shark, NP  traMADol (ULTRAM) 50 MG tablet Take 50 mg by mouth every 6 (six) hours as needed. 07/11/15   Historical Provider, MD     Family History  Problem Relation Age of Onset  . Cancer Father     Social History   Social History  . Marital status: Married    Spouse name: N/A  . Number of children: N/A  . Years of education: N/A   Social History Main Topics  . Smoking status: Never Smoker  . Smokeless tobacco: Never Used  . Alcohol use No  . Drug use: No  . Sexual activity: Not Asked   Other Topics Concern  . None   Social History Narrative  . None      Review of Systems currently denies fever, abdominal pain, nausea, vomiting or abnormal bleeding.  He does have occasional headaches, congestion, occasional cough, weakness, and intermittent low back pain. He has right eye diplopia.  Vital Signs: BP 112/82 (BP Location: Left Arm)   Pulse (!) 116   Temp 99 F (37.2 C) (Oral)   Resp 18   SpO2 98%   Physical Exam awake, alert. Chest clear to auscultation bilaterally. Heart with tachycardic but regular rhythm; abd soft, positive bowel sounds, nontender. Lower extremities- no edema. Mild weakness with lateral rotation rt eye  Mallampati Score:     Imaging: Dg Chest 2 View  Result Date: 05/27/2016 CLINICAL DATA:  Cough and fever EXAM: CHEST  2 VIEW COMPARISON:  PA and lateral chest x-ray of July 04, 2015 FINDINGS: The lungs are adequately inflated. Subtle increased lung markings are noted in the left lower lobe which is accentuated by overlapping osteophyte in the lower thoracic spine. The right lung is clear. The heart and pulmonary vascularity are normal. The mediastinum is normal in width. There is multilevel degenerative disc disease of the thoracic spine with calcification in the a jejunal longitudinal ligament. IMPRESSION: Hazy increased density in the left lower lobe may reflect subsegmental atelectasis or early pneumonia. Followup PA and lateral chest X-ray is recommended in 3-4 weeks following trial of antibiotic therapy to ensure resolution. Electronically Signed   By: David  Martinique M.D.   On: 05/27/2016 11:20    Labs:  CBC:  Recent Labs  03/20/16 0850 04/30/16 0834 05/01/16 0605 06/11/16 1152  WBC 4.1 5.7 11.0* 8.7  HGB 13.9 13.9 13.7 14.0  HCT 40.2 39.6 40.3 40.1  PLT 155 158 181 265    COAGS:  Recent Labs  06/29/15 1415 06/11/16 1152  INR 1.04 1.07  APTT 27  --     BMP:  Recent Labs  06/29/15 1415  02/07/16 0949 03/20/16 0850 04/30/16 0834 05/01/16 0605  NA 141  < > 141 140 139 138  K 4.8  < > 4.3 4.0 3.7 4.4  CL 106  --   --   --  105 105  CO2 27  < > '23 23 29 25  '$ GLUCOSE 104*  < > 113 111 115* 150*  BUN 15  < > 15.3 17.'6 17 19  '$ CALCIUM 9.7  < > 9.3 9.6 9.3 9.7  CREATININE 1.26*  < > 1.0 1.0 1.03 0.85  GFRNONAA 60*  --   --   --  >60 >60  GFRAA >60  --   --   --  >60 >60  < > = values in this interval not displayed.  LIVER FUNCTION TESTS:  Recent Labs  11/08/15 1022 12/20/15 0915 02/07/16 0949 03/20/16 0850  BILITOT 0.84 0.91 0.48 0.71  AST '21 20 20 21  '$ ALT '18 14 15 16  '$ ALKPHOS 82 77 76 83  PROT 7.3 7.0 6.7 6.8  ALBUMIN 4.0 3.8 3.6 3.6    TUMOR MARKERS: No results for input(s): AFPTM, CEA, CA199, CHROMGRNA in the last 8760 hours.  Assessment and Plan: 61 y.o. male with history of metastatic esophageal cancer (originally  diagnosed 2015, prior chemorad) who presents today for port a cath placement for additional chemotherapy.  He is recently recovering from influenza A and pneumonia and has completed treatment with Tamiflu and Levaquin.Risks and Benefits discussed with the patient including, but not limited to bleeding, infection, pneumothorax, or fibrin sheath development and need for additional procedures.All of the patient's questions were answered, patient is agreeable to proceed.Consent signed and in chart.  Thank you for this interesting consult.  I greatly enjoyed meeting CROCKETT RALLO and look forward to participating in their care.  A copy of this report was sent to the requesting provider on this date.  Electronically Signed: D. Rowe Robert 06/11/2016, 12:32 PM   I spent a total of  20 minutes   in face to face in clinical consultation, greater than 50% of which was counseling/coordinating care for port a cath placement

## 2016-06-13 ENCOUNTER — Ambulatory Visit: Payer: BLUE CROSS/BLUE SHIELD

## 2016-06-13 ENCOUNTER — Inpatient Hospital Stay (HOSPITAL_COMMUNITY)
Admission: EM | Admit: 2016-06-13 | Discharge: 2016-06-15 | DRG: 175 | Disposition: A | Discharge status: Home / Self Care | Payer: BLUE CROSS/BLUE SHIELD | Attending: Internal Medicine | Admitting: Internal Medicine

## 2016-06-13 ENCOUNTER — Ambulatory Visit (HOSPITAL_COMMUNITY)
Admission: RE | Admit: 2016-06-13 | Discharge: 2016-06-13 | Disposition: A | Discharge status: Home / Self Care | Payer: BLUE CROSS/BLUE SHIELD | Source: Ambulatory Visit | Attending: Oncology | Admitting: Oncology

## 2016-06-13 ENCOUNTER — Ambulatory Visit: Payer: BLUE CROSS/BLUE SHIELD | Admitting: Oncology

## 2016-06-13 ENCOUNTER — Ambulatory Visit (HOSPITAL_BASED_OUTPATIENT_CLINIC_OR_DEPARTMENT_OTHER): Payer: BLUE CROSS/BLUE SHIELD | Admitting: Oncology

## 2016-06-13 ENCOUNTER — Telehealth: Payer: Self-pay | Admitting: *Deleted

## 2016-06-13 ENCOUNTER — Other Ambulatory Visit: Payer: Self-pay

## 2016-06-13 ENCOUNTER — Other Ambulatory Visit: Payer: BLUE CROSS/BLUE SHIELD

## 2016-06-13 ENCOUNTER — Ambulatory Visit (HOSPITAL_COMMUNITY): Admission: RE | Admit: 2016-06-13 | Payer: BLUE CROSS/BLUE SHIELD | Source: Ambulatory Visit

## 2016-06-13 ENCOUNTER — Emergency Department (HOSPITAL_COMMUNITY): Payer: BLUE CROSS/BLUE SHIELD

## 2016-06-13 ENCOUNTER — Encounter (HOSPITAL_COMMUNITY): Payer: Self-pay

## 2016-06-13 VITALS — BP 104/75 | HR 133 | Temp 98.2°F | Resp 22

## 2016-06-13 DIAGNOSIS — I2699 Other pulmonary embolism without acute cor pulmonale: Secondary | ICD-10-CM | POA: Diagnosis present

## 2016-06-13 DIAGNOSIS — H532 Diplopia: Secondary | ICD-10-CM | POA: Diagnosis present

## 2016-06-13 DIAGNOSIS — K219 Gastro-esophageal reflux disease without esophagitis: Secondary | ICD-10-CM | POA: Diagnosis present

## 2016-06-13 DIAGNOSIS — R6510 Systemic inflammatory response syndrome (SIRS) of non-infectious origin without acute organ dysfunction: Secondary | ICD-10-CM | POA: Diagnosis present

## 2016-06-13 DIAGNOSIS — G8929 Other chronic pain: Secondary | ICD-10-CM | POA: Diagnosis present

## 2016-06-13 DIAGNOSIS — Z86711 Personal history of pulmonary embolism: Secondary | ICD-10-CM | POA: Diagnosis not present

## 2016-06-13 DIAGNOSIS — C7801 Secondary malignant neoplasm of right lung: Secondary | ICD-10-CM | POA: Diagnosis not present

## 2016-06-13 DIAGNOSIS — Z6826 Body mass index (BMI) 26.0-26.9, adult: Secondary | ICD-10-CM | POA: Diagnosis not present

## 2016-06-13 DIAGNOSIS — R059 Cough, unspecified: Secondary | ICD-10-CM

## 2016-06-13 DIAGNOSIS — C3431 Malignant neoplasm of lower lobe, right bronchus or lung: Secondary | ICD-10-CM | POA: Diagnosis present

## 2016-06-13 DIAGNOSIS — C154 Malignant neoplasm of middle third of esophagus: Secondary | ICD-10-CM

## 2016-06-13 DIAGNOSIS — C7951 Secondary malignant neoplasm of bone: Secondary | ICD-10-CM | POA: Diagnosis present

## 2016-06-13 DIAGNOSIS — E440 Moderate protein-calorie malnutrition: Secondary | ICD-10-CM | POA: Diagnosis present

## 2016-06-13 DIAGNOSIS — R05 Cough: Secondary | ICD-10-CM

## 2016-06-13 DIAGNOSIS — R079 Chest pain, unspecified: Secondary | ICD-10-CM

## 2016-06-13 DIAGNOSIS — A419 Sepsis, unspecified organism: Secondary | ICD-10-CM | POA: Diagnosis present

## 2016-06-13 DIAGNOSIS — R1310 Dysphagia, unspecified: Secondary | ICD-10-CM | POA: Diagnosis present

## 2016-06-13 DIAGNOSIS — R0609 Other forms of dyspnea: Secondary | ICD-10-CM

## 2016-06-13 DIAGNOSIS — Z923 Personal history of irradiation: Secondary | ICD-10-CM | POA: Diagnosis not present

## 2016-06-13 DIAGNOSIS — M549 Dorsalgia, unspecified: Secondary | ICD-10-CM | POA: Diagnosis present

## 2016-06-13 DIAGNOSIS — C771 Secondary and unspecified malignant neoplasm of intrathoracic lymph nodes: Secondary | ICD-10-CM

## 2016-06-13 DIAGNOSIS — J209 Acute bronchitis, unspecified: Secondary | ICD-10-CM | POA: Diagnosis present

## 2016-06-13 DIAGNOSIS — R0602 Shortness of breath: Secondary | ICD-10-CM | POA: Diagnosis present

## 2016-06-13 LAB — CBC WITH DIFFERENTIAL/PLATELET
BASOS ABS: 0 10*3/uL (ref 0.0–0.1)
BASOS PCT: 0 %
EOS ABS: 0 10*3/uL (ref 0.0–0.7)
Eosinophils Relative: 0 %
HCT: 37.8 % — ABNORMAL LOW (ref 39.0–52.0)
HEMOGLOBIN: 13.3 g/dL (ref 13.0–17.0)
Lymphocytes Relative: 11 %
Lymphs Abs: 1.1 10*3/uL (ref 0.7–4.0)
MCH: 29.8 pg (ref 26.0–34.0)
MCHC: 35.2 g/dL (ref 30.0–36.0)
MCV: 84.8 fL (ref 78.0–100.0)
Monocytes Absolute: 0.8 10*3/uL (ref 0.1–1.0)
Monocytes Relative: 8 %
NEUTROS PCT: 81 %
Neutro Abs: 7.7 10*3/uL (ref 1.7–7.7)
PLATELETS: 227 10*3/uL (ref 150–400)
RBC: 4.46 MIL/uL (ref 4.22–5.81)
RDW: 12.7 % (ref 11.5–15.5)
WBC: 9.6 10*3/uL (ref 4.0–10.5)

## 2016-06-13 LAB — COMPREHENSIVE METABOLIC PANEL
ALK PHOS: 108 U/L (ref 38–126)
ALT: 14 U/L — AB (ref 17–63)
AST: 18 U/L (ref 15–41)
Albumin: 3.2 g/dL — ABNORMAL LOW (ref 3.5–5.0)
Anion gap: 10 (ref 5–15)
BILIRUBIN TOTAL: 1.4 mg/dL — AB (ref 0.3–1.2)
BUN: 10 mg/dL (ref 6–20)
CALCIUM: 9.6 mg/dL (ref 8.9–10.3)
CHLORIDE: 98 mmol/L — AB (ref 101–111)
CO2: 28 mmol/L (ref 22–32)
CREATININE: 1.1 mg/dL (ref 0.61–1.24)
Glucose, Bld: 116 mg/dL — ABNORMAL HIGH (ref 65–99)
Potassium: 3.9 mmol/L (ref 3.5–5.1)
Sodium: 136 mmol/L (ref 135–145)
TOTAL PROTEIN: 7 g/dL (ref 6.5–8.1)

## 2016-06-13 LAB — I-STAT CG4 LACTIC ACID, ED
LACTIC ACID, VENOUS: 1.48 mmol/L (ref 0.5–1.9)
Lactic Acid, Venous: 1.13 mmol/L (ref 0.5–1.9)

## 2016-06-13 LAB — APTT: APTT: 124 s — AB (ref 24–36)

## 2016-06-13 LAB — I-STAT TROPONIN, ED: TROPONIN I, POC: 0 ng/mL (ref 0.00–0.08)

## 2016-06-13 MED ORDER — DICLOFENAC SODIUM 1 % TD GEL
4.0000 g | Freq: Four times a day (QID) | TRANSDERMAL | Status: DC | PRN
  Filled 2016-06-13: qty 100

## 2016-06-13 MED ORDER — IOPAMIDOL (ISOVUE-370) INJECTION 76%
100.0000 mL | Freq: Once | INTRAVENOUS | Status: AC | PRN
  Administered 2016-06-13: 100 mL via INTRAVENOUS

## 2016-06-13 MED ORDER — MORPHINE SULFATE (PF) 4 MG/ML IV SOLN
2.0000 mg | INTRAVENOUS | Status: DC | PRN
  Administered 2016-06-13 – 2016-06-15 (×8): 2 mg via INTRAVENOUS
  Filled 2016-06-13 (×8): qty 1

## 2016-06-13 MED ORDER — POLYETHYLENE GLYCOL 3350 17 G PO PACK: 17.0000 g | PACK | Freq: Every day | ORAL | Status: DC | PRN

## 2016-06-13 MED ORDER — HYDROCODONE-ACETAMINOPHEN 5-325 MG PO TABS
1.0000 | ORAL_TABLET | Freq: Four times a day (QID) | ORAL | Status: DC | PRN
  Administered 2016-06-15: 2 via ORAL
  Filled 2016-06-13: qty 2

## 2016-06-13 MED ORDER — SODIUM CHLORIDE 0.9% FLUSH
3.0000 mL | Freq: Two times a day (BID) | INTRAVENOUS | Status: DC
  Administered 2016-06-13 – 2016-06-14 (×2): 3 mL via INTRAVENOUS

## 2016-06-13 MED ORDER — ENSURE ENLIVE PO LIQD
237.0000 mL | Freq: Two times a day (BID) | ORAL | Status: DC
  Administered 2016-06-14: 237 mL via ORAL

## 2016-06-13 MED ORDER — HEPARIN (PORCINE) IN NACL 100-0.45 UNIT/ML-% IJ SOLN
1350.0000 [IU]/h | INTRAMUSCULAR | Status: DC
  Administered 2016-06-13: 1450 [IU]/h via INTRAVENOUS
  Filled 2016-06-13: qty 250

## 2016-06-13 MED ORDER — ONDANSETRON HCL 4 MG PO TABS
4.0000 mg | ORAL_TABLET | Freq: Four times a day (QID) | ORAL | Status: DC | PRN
  Filled 2016-06-13: qty 1

## 2016-06-13 MED ORDER — PANTOPRAZOLE SODIUM 40 MG PO TBEC
40.0000 mg | DELAYED_RELEASE_TABLET | Freq: Every day | ORAL | Status: DC
  Administered 2016-06-13 – 2016-06-15 (×3): 40 mg via ORAL
  Filled 2016-06-13 (×3): qty 1

## 2016-06-13 MED ORDER — IOPAMIDOL (ISOVUE-370) INJECTION 76%
INTRAVENOUS | Status: AC
  Filled 2016-06-13: qty 100

## 2016-06-13 MED ORDER — HEPARIN BOLUS VIA INFUSION
4000.0000 [IU] | Freq: Once | INTRAVENOUS | Status: AC
  Administered 2016-06-13: 4000 [IU] via INTRAVENOUS
  Filled 2016-06-13: qty 4000

## 2016-06-13 MED ORDER — ACETAMINOPHEN 325 MG PO TABS
650.0000 mg | ORAL_TABLET | Freq: Four times a day (QID) | ORAL | Status: DC | PRN
  Administered 2016-06-13: 650 mg via ORAL
  Administered 2016-06-14: 325 mg via ORAL
  Filled 2016-06-13 (×2): qty 2

## 2016-06-13 MED ORDER — SODIUM CHLORIDE 0.9 % IJ SOLN
INTRAMUSCULAR | Status: AC
  Filled 2016-06-13: qty 50

## 2016-06-13 MED ORDER — SODIUM CHLORIDE 0.9% FLUSH: 3.0000 mL | INTRAVENOUS | Status: DC | PRN

## 2016-06-13 MED ORDER — SODIUM CHLORIDE 0.9 % IV SOLN: 250.0000 mL | INTRAVENOUS | Status: DC | PRN

## 2016-06-13 MED ORDER — SODIUM CHLORIDE 0.9% FLUSH
3.0000 mL | Freq: Two times a day (BID) | INTRAVENOUS | Status: DC
  Administered 2016-06-13: 3 mL via INTRAVENOUS

## 2016-06-13 MED ORDER — FAMOTIDINE IN NACL 20-0.9 MG/50ML-% IV SOLN
20.0000 mg | Freq: Two times a day (BID) | INTRAVENOUS | Status: DC
  Administered 2016-06-13 – 2016-06-15 (×4): 20 mg via INTRAVENOUS
  Filled 2016-06-13 (×6): qty 50

## 2016-06-13 MED ORDER — ACETAMINOPHEN 650 MG RE SUPP: 650.0000 mg | Freq: Four times a day (QID) | RECTAL | Status: DC | PRN

## 2016-06-13 MED ORDER — MORPHINE SULFATE (PF) 2 MG/ML IV SOLN: 2.0000 mg | INTRAVENOUS | Status: DC | PRN

## 2016-06-13 MED ORDER — SODIUM CHLORIDE 0.9 % IV BOLUS (SEPSIS)
1000.0000 mL | Freq: Once | INTRAVENOUS | Status: AC
  Administered 2016-06-13: 1000 mL via INTRAVENOUS

## 2016-06-13 MED ORDER — BACLOFEN 10 MG PO TABS
10.0000 mg | ORAL_TABLET | Freq: Every day | ORAL | Status: DC
  Administered 2016-06-13 – 2016-06-14 (×2): 10 mg via ORAL
  Filled 2016-06-13 (×2): qty 1

## 2016-06-13 MED ORDER — ONDANSETRON HCL 4 MG/2ML IJ SOLN
4.0000 mg | Freq: Four times a day (QID) | INTRAMUSCULAR | Status: DC | PRN
  Administered 2016-06-15: 4 mg via INTRAVENOUS
  Filled 2016-06-13: qty 2

## 2016-06-13 MED ORDER — BISACODYL 5 MG PO TBEC
5.0000 mg | DELAYED_RELEASE_TABLET | Freq: Every day | ORAL | Status: DC | PRN
Start: 2016-06-13 — End: 2016-06-15

## 2016-06-13 MED ORDER — DEXTROSE 5 % IV SOLN
500.0000 mg | INTRAVENOUS | Status: DC
  Administered 2016-06-13 – 2016-06-14 (×2): 500 mg via INTRAVENOUS
  Filled 2016-06-13 (×3): qty 500

## 2016-06-13 NOTE — ED Notes (Signed)
[phone] [name](wife)

## 2016-06-13 NOTE — H&P (Signed)
History and Physical    Billy Romero RKY:[id number] DOB: 1955-11-05 DOA: 06/13/2016  PCP: Lujean Amel, MD   Patient coming from: Home, by way of [place]  Chief Complaint: Pleuritic pain, fever, productive cough  HPI: Billy Romero is a 61 y.o. male with medical history significant for esophageal cancer with metastases, presenting to the emergency department at the direction of his oncologist for evaluation of fevers, weakness, tachycardia, and pleuritic pain. Patient reports that he was diagnosed with influenza and pneumonia approximately 2 weeks ago and has completed treatment with Tamiflu and Levaquin, but has not experienced any significant improvement. In fact, he reports worsening cough productive of brownish sputum and the new development of left lateral chest wall pain with deep inspiration or cough. He denies any lower extremity swelling or tenderness, denies orthopnea or PND, and denies any personal or family history of VTE. Patient had a Port-A-Cath placed on 06/11/2016 and was scheduled to begin treatment with FOLFOX today. He was noted to be acutely ill-appearing at the cancer center with heart rate in the 130s. His oncologist was very concerned for PE and the patient was sent to the emergency department for evaluation of this.    ED Course: Upon arrival to the ED, patient is found to be febrile to 38.1 C, tachycardic in the 110s, and with vitals otherwise stable. Chest x-ray is notable for a new mild left basilar opacity, possibly representing atelectasis versus early pneumonia. Chemistry panel is largely unremarkable and CBC is within normal limits. Troponin is undetectable and lactic acid is reassuring at 1.48. CTA PE study was obtained and notable for PE and segmental left lower lobe artery without evidence for right heart strain. There is increased mediastinal and hilar adenopathy. 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 Blood cultures were obtained and the patient was given 1 L of normal saline in the emergency department. He was started on heparin infusion per VTE-dosing. Patient has remained mildly tachycardic in the ED, but with stable blood pressure and no acute respiratory distress. He will be admitted to the telemetry unit for ongoing evaluation and management of acute PE.  Review of Systems:  All other systems reviewed and apart from HPI, are negative.  Past Medical History:  Diagnosis Date  . Chronic back pain   . Complication of anesthesia    WOKE UP DURING COLONOSCOPY  . Esophageal cancer ([place]) 03/29/14  . GERD (gastroesophageal reflux disease)   . Pneumonia    May 27, 2016, was + for flu also  . Primary cancer of esophagus with metastasis to other site [facility]) 07/04/2015    Past Surgical History:  Procedure Laterality Date  . APPENDECTOMY    . CERVICAL FUSION    . EYE SURGERY     PTEGERIUM   BIL EYES  . HAND SURGERY     DUPRAGENS CONTRACTURE  RT HAND  . IR GENERIC HISTORICAL  06/11/2016   IR US GUIDE VASC ACCESS RIGHT 06/11/2016 [facility]-INTERV RAD  . IR GENERIC HISTORICAL  06/11/2016   IR FLUORO GUIDE PORT INSERTION RIGHT 06/11/2016 [facility]-INTER[facility]  . KNEE ARTHROSCOPY     LEFT  . PEG PLACEMENT N/A 04/06/2014   Procedure: OPEN JEJUNOSTOMY TUBE PLACEMENT;  Surgeon: Armandina Gemma, MD;  Location: [facility]S;  Service: General;  Laterality: N/A;  . VIDEO BRONCHOSCOPY WITH ENDOBRONCHIAL ULTRASOUND N/A  07/04/2015   Procedure: VIDEO BRONCHOSCOPY WITH ENDOBRONCHIAL ULTRASOUND with brushings and biopsies.;  Surgeon: Grace Isaac, MD;  Location: [place];  Service: Thoracic;  Laterality: N/A;     reports that he has never smoked. He has never used smokeless tobacco. He reports that he does not drink alcohol or use drugs.  No Known Allergies  Family History   Problem Relation Age of Onset  . Cancer Father      Prior to Admission medications   Medication Sig Start Date End Date Taking? Authorizing Provider  baclofen (LIORESAL) 10 MG tablet Take 10 mg by mouth at bedtime.  01/12/15  Yes Historical Provider, MD  esomeprazole (NEXIUM) 20 MG capsule Take 20 mg by mouth daily at 12 noon.   Yes Historical Provider, MD  HYDROcodone-acetaminophen (NORCO/VICODIN) 5-325 MG tablet Take 1 tablet by mouth 3 (three) times daily as needed for moderate pain. for pain 12/20/15  Yes Ladell Pier, MD  diclofenac sodium (VOLTAREN) 1 % GEL Apply 4 g topically 4 (four) times daily as needed (pain).  03/27/16   Historical Provider, MD    Physical Exam: Vitals:   06/13/16 1815 06/13/16 1930 06/13/16 2014 06/13/16 2046  BP:  117/76 127/76 118/84  Pulse:  113 102 108  Resp:  '17 19 20  '$ Temp:    99.8 F (37.7 C)  TempSrc:    Oral  SpO2:  97% 98% 100%  Weight: 89.4 kg (197 lb)     Height: '5\' 11"'$  (1.803 m)         Constitutional: NAD, calm, comfortable Eyes: PERTLA, lids and conjunctivae normal ENMT: Mucous membranes are moist. Posterior pharynx clear of any exudate or lesions.   Neck: normal, supple, no masses, no thyromegaly Respiratory: Rhonchi at left mid-lung zone. Normal respiratory effort. No accessory muscle use.  Cardiovascular: Rate ~110 and regular. No extremity edema. No significant JVD. Abdomen: No distension, no tenderness, no masses palpated. Bowel sounds normal.  Musculoskeletal: no clubbing / cyanosis. No joint deformity upper and lower extremities. Normal muscle tone.  Skin: no significant rashes, lesions, ulcers. Warm, dry, well-perfused. Port site in right chest is clean/dry.  Neurologic: CN 2-12 grossly intact. Sensation intact, DTR normal. Strength 5/5 in all 4 limbs.  Psychiatric: Normal judgment and insight. Alert and oriented x 3. Normal mood and affect.     Labs on Admission: I have personally reviewed following labs and imaging  studies  CBC:  Recent Labs Lab 06/11/16 1152 06/13/16 1916  WBC 8.7 9.6  NEUTROABS 7.0 7.7  HGB 14.0 13.3  HCT 40.1 37.8*  MCV 85.9 84.8  PLT 265 130   Basic Metabolic Panel:  Recent Labs Lab 06/13/16 1916  NA 136  K 3.9  CL 98*  CO2 28  GLUCOSE 116*  BUN 10  CREATININE 1.10  CALCIUM 9.6   GFR: Estimated Creatinine Clearance: 74.2 mL/min (by C-G formula based on SCr of 1.1 mg/dL). Liver Function Tests:  Recent Labs Lab 06/13/16 1916  AST 18  ALT 14*  ALKPHOS 108  BILITOT 1.4*  PROT 7.0  ALBUMIN 3.2*   No results for input(s): LIPASE, AMYLASE in the last 168 hours. No results for input(s): AMMONIA in the last 168 hours. Coagulation Profile:  Recent Labs Lab 06/11/16 1152  INR 1.07   Cardiac Enzymes: No results for input(s): CKTOTAL, CKMB, CKMBINDEX, TROPONINI in the last 168 hours. BNP (last 3 results) No results for input(s): PROBNP in the last 8760 hours. HbA1C: No results for input(s): HGBA1C  in the last 72 hours. CBG: No results for input(s): GLUCAP in the last 168 hours. Lipid Profile: No results for input(s): CHOL, HDL, LDLCALC, TRIG, CHOLHDL, LDLDIRECT in the last 72 hours. Thyroid Function Tests: No results for input(s): TSH, T4TOTAL, FREET4, T3FREE, THYROIDAB in the last 72 hours. Anemia Panel: No results for input(s): VITAMINB12, FOLATE, FERRITIN, TIBC, IRON, RETICCTPCT in the last 72 hours. Urine analysis:    Component Value Date/Time   COLORURINE YELLOW 04/30/2016 [place] 04/30/2016 1155   LABSPEC 1.017 04/30/2016 1155   PHURINE 7.0 04/30/2016 1155   GLUCOSEU NEGATIVE 04/30/2016 1155   HGBUR NEGATIVE 04/30/2016 [place] 04/30/2016 1155   KETONESUR NEGATIVE 04/30/2016 1155   PROTEINUR NEGATIVE 04/30/2016 1155   UROBILINOGEN 1.0 04/11/2014 1830   NITRITE NEGATIVE 04/30/2016 1155   LEUKOCYTESUR NEGATIVE 04/30/2016 1155   Sepsis Labs: '@LABRCNTIP'$ (procalcitonin:4,lacticidven:4) )No results  found for this or any previous visit (from the past 240 hour(s)).   Radiological Exams on Admission: Dg Chest 2 View  Result Date: 06/13/2016 CLINICAL DATA:  Persistent cough for 2 weeks and weakness. Pleuritic left chest pain. History esophageal cancer. EXAM: CHEST  2 VIEW COMPARISON:  05/27/2016 FINDINGS: A right jugular Port-A-Cath has been placed and terminates over the lower SVC. The cardiomediastinal silhouette is within normal limits. There is mild patchy and nodular opacity in the left lung base which is new or increased from the prior study. The right lung is clear. No pleural effusion or pneumothorax is identified. Thoracic spondylosis is noted. IMPRESSION: New mild left basilar opacity which may reflect atelectasis or early pneumonia. Follow-up radiographs are recommended to ensure resolution given the mildly nodular appearance and history of esophageal cancer. Electronically Signed   By: Logan Bores M.D.   On: 06/13/2016 17:56   Ct Angio Chest Pe W And/or Wo Contrast  Result Date: 06/13/2016 CLINICAL DATA:  61 y/o M; generalized weakness, tachycardia, left-sided chest pain. Esophageal cancer post radiation 01/18. Recent port placement. Treated for fluid pneumonia 1 week ago. EXAM: CT ANGIOGRAPHY CHEST WITH CONTRAST TECHNIQUE: Multidetector CT imaging of the chest was performed using the standard protocol during bolus administration of intravenous contrast. Multiplanar CT image reconstructions and MIPs were obtained to evaluate the vascular anatomy. CONTRAST:  100 cc Isovue 370 COMPARISON:  06/13/2016 chest radiograph.  04/25/2016 chest CT. FINDINGS: Cardiovascular: Satisfactory opacification of pulmonary arteries. Segmental pulmonary embolus within the left lower lobe basal lateral segmental artery (series 7, image 142). RV/ LV index = 0.8. Normal size main pulmonary artery and thoracic aorta. Normal heart size. Small pericardial effusion. Mediastinum/Nodes: Mediastinal lymphadenopathy is  stable or increased in size in comparison with the prior CT of the chest for example an aortopulmonary lymph node measures 19 mm short axis, previously 17 mm (series 7, image 102) ; a left hilar node measures 15 mm, previously 13 mm (series 7, image 124). Additionally, there is progressive narrowing of multiple pulmonary artery is and pulmonary veins likely due to tumor infiltration for example in the left upper lobe lingular pulmonary artery origin (series 7, image 110). Stable thickening of the wall of the mid esophagus. Lungs/Pleura: There is stenosis or occlusion of multiple lobar and segmental bronchi due to mass effect from hilar adenopathy which has progressed from the prior CT. Left upper lobe pulmonary nodules are increased in size for example along the left major fissure measuring 13 mm, previously 9 mm (series 8, image 47). There has been interval development of nodular opacities within the  hilar region of the right lower lobe with septal thickening and peribronchial thickening suspicious for lymphangitic carcinomatosis. Small left pleural effusion. Paramediastinal reticulation may represent posttreatment changes. Upper Abdomen: Stable gastrohepatic lymphadenopathy measuring 16 mm short axis (series 7, image 237). Musculoskeletal: No acute osseous abnormality is identified. Review of the MIP images confirms the above findings. IMPRESSION: 1. Positive for pulmonary embolus of a segmental left lower lobe pulmonary artery. No evidence for right heart strain. 2. Increasing mediastinal and hilar lymphadenopathy with progressive stenosis of multiple pulmonary artery and bronchial lobar and segmental branch origins. 3. 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 4. Stable wall thickening of mid esophagus. 5. Paramediastinal  reticulation of the lungs may represent radiation changes. 6. Stable gastrohepatic lymphadenopathy. These results were called by telephone at the time of interpretation on 06/13/2016 at 8:54 pm to Dr. Zenovia Jarred , who verbally acknowledged these results. Electronically Signed   By: Kristine Garbe M.D.   On: 06/13/2016 20:58    EKG: Independently reviewed: Sinus tachycardia (rate 114), borderline LAD   Assessment/Plan  1. Acute pulmonary embolism  - Pt presents with worsening cough and pleuritic pain despite completing treatment for PNA  - He was seen at the [facility], noted to be tachycardic to the 130's, and directed to ED for CTA chest  - CTA is positive for segmental PE in LLL without evidence for hear-strain  - He has been started on anticoagulation with heparin infusion and case management consulted to assist with oral medication  - Initial troponin negative, will obtain serial measurements and monitor on telemetry  - No hypoxia or respiratory distress on admission, will monitor  - TTE and BLE venous US ordered    2. 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 GERD - Pt reports poorly-controlled sxs for the past week despite daily Nexium at home - Continue daily PPI; add Pepcid BID for now     DVT prophylaxis: heparin infusion  per VTE dosing Code Status: Full  Family Communication: Children updated at bedside with patient's permission   Disposition Plan: Admit to telemetry Consults called: None Admission status: Inpatient    Vianne Bulls, MD [facility] Pager [phone]  If 7PM-7AM, please contact night-coverage [email or web address] Password [facility]  06/13/2016, 9:42 PM

## 2016-06-13 NOTE — ED Triage Notes (Addendum)
Pt from cancer center- Recent placement of power port [facility]. 2 days ago. Treated for flu and pneumonia 1 week ago. Radiation 01/18. NO CHEMO. Pt presents with generalized weakness, increased heart rate, with left sided chest pain for several days. Fever only today.

## 2016-06-13 NOTE — ED Notes (Signed)
Roderic Palau RN bedside report given

## 2016-06-13 NOTE — ED Notes (Signed)
BLOOD CULTURE X 1 5ML/EACH OBTAINED RT AC

## 2016-06-13 NOTE — Progress Notes (Signed)
[facility] OFFICE PROGRESS NOTE   Diagnosis: Esophagus cancer  INTERVAL HISTORY:   [facility] Romero was scheduled to begin FOLFOX chemotherapy this morning. He underwent placement of a Port-A-Cath 06/11/2016. He reports feeling too weak to get out of bed this morning. He has a cough, exertional dyspnea, and pleuritic left-sided chest pain. The pain is at the mid left anterolateral and posterior chest. He has a productive cough of "brown" sputum. No fever. He feels "weak ". He reports the symptoms have been present for greater than one week.  Objective:  Vital signs in last 24 hours:  Blood pressure 104/75, pulse (!) 133, temperature 98.2 F (36.8 C), temperature source Oral, resp. rate (!) 22, SpO2 98 %.    HEENT: Neck without mass Lymphatics: No cervical or supraclavicular nodes Resp: end iinspiratory rub at the left anterolateral chest, the lungs are otherwise clear. Cardio: Tachycardia, regular rate and rhythm, the neck veins are not distended GI: No hepatosplenomegaly, nontender Vascular: No leg edema     Portacath/PICC-without erythema  Lab Results:  Lab Results  Component Value Date   WBC 8.7 06/11/2016   HGB 14.0 06/11/2016   HCT 40.1 06/11/2016   MCV 85.9 06/11/2016   PLT 265 06/11/2016   NEUTROABS 7.0 06/11/2016     Imaging:  Ir US Guide Vasc Access Right  Result Date: 06/11/2016 INDICATION: History of esophageal cancer. In need of durable intravenous access for chemotherapy administration. EXAM: IMPLANTED PORT A CATH PLACEMENT WITH ULTRASOUND AND FLUOROSCOPIC GUIDANCE COMPARISON:  None. MEDICATIONS: Ancef 2 gm IV; The antibiotic was administered within an appropriate time interval prior to skin puncture. ANESTHESIA/SEDATION: Moderate (conscious) sedation was employed during this procedure. A total of Versed 2.5 mg and Fentanyl 125 mcg was administered intravenously. Moderate Sedation Time: 28 minutes. The patient's level of consciousness and vital  signs were monitored continuously by radiology nursing throughout the procedure under my direct supervision. CONTRAST:  None FLUOROSCOPY TIME:  36 seconds (8 mGy) COMPLICATIONS: None immediate. PROCEDURE: The procedure, risks, benefits, and alternatives were explained to the patient. Questions regarding the procedure were encouraged and answered. The patient understands and consents to the procedure. The right neck and chest were prepped with chlorhexidine in a sterile fashion, and a sterile drape was applied covering the operative field. Maximum barrier sterile technique with sterile gowns and gloves were used for the procedure. A timeout was performed prior to the initiation of the procedure. Local anesthesia was provided with 1% lidocaine with epinephrine. After creating a small venotomy incision, a micropuncture kit was utilized to access the internal jugular vein under direct, real-time ultrasound guidance. Ultrasound image documentation was performed. The microwire was kinked to measure appropriate catheter length. A subcutaneous port pocket was then created along the upper chest wall utilizing a combination of sharp and blunt dissection. The pocket was irrigated with sterile saline. A single lumen thin power injectable port was chosen for placement. The 8 Fr catheter was tunneled from the port pocket site to the venotomy incision. The port was placed in the pocket. The external catheter was trimmed to appropriate length. At the venotomy, an 8 Fr peel-away sheath was placed over a guidewire under fluoroscopic guidance. The catheter was then placed through the sheath and the sheath was removed. Final catheter positioning was confirmed and documented with a fluoroscopic spot radiograph. The port was accessed with a Huber needle, aspirated and flushed with heparinized saline. The venotomy site was closed with an interrupted 4-0 Vicryl suture. The port pocket  incision was closed with interrupted 2-0 Vicryl suture  and the skin was opposed with a running subcuticular 4-0 Vicryl suture. Dermabond and Steri-strips were applied to both incisions. Dressings were placed. The patient tolerated the procedure well without immediate post procedural complication. FINDINGS: After catheter placement, the tip lies within the superior cavoatrial junction. The catheter aspirates and flushes normally and is ready for immediate use. IMPRESSION: Successful placement of a right internal jugular approach power injectable Port-A-Cath. The catheter is ready for immediate use. Electronically Signed   By: Sandi Mariscal M.D.   On: 06/11/2016 15:42   Ir Fluoro Guide Port Insertion Right  Result Date: 06/11/2016 INDICATION: History of esophageal cancer. In need of durable intravenous access for chemotherapy administration. EXAM: IMPLANTED PORT A CATH PLACEMENT WITH ULTRASOUND AND FLUOROSCOPIC GUIDANCE COMPARISON:  None. MEDICATIONS: Ancef 2 gm IV; The antibiotic was administered within an appropriate time interval prior to skin puncture. ANESTHESIA/SEDATION: Moderate (conscious) sedation was employed during this procedure. A total of Versed 2.5 mg and Fentanyl 125 mcg was administered intravenously. Moderate Sedation Time: 28 minutes. The patient's level of consciousness and vital signs were monitored continuously by radiology nursing throughout the procedure under my direct supervision. CONTRAST:  None FLUOROSCOPY TIME:  36 seconds (8 mGy) COMPLICATIONS: None immediate. PROCEDURE: The procedure, risks, benefits, and alternatives were explained to the patient. Questions regarding the procedure were encouraged and answered. The patient understands and consents to the procedure. The right neck and chest were prepped with chlorhexidine in a sterile fashion, and a sterile drape was applied covering the operative field. Maximum barrier sterile technique with sterile gowns and gloves were used for the procedure. A timeout was performed prior to the  initiation of the procedure. Local anesthesia was provided with 1% lidocaine with epinephrine. After creating a small venotomy incision, a micropuncture kit was utilized to access the internal jugular vein under direct, real-time ultrasound guidance. Ultrasound image documentation was performed. The microwire was kinked to measure appropriate catheter length. A subcutaneous port pocket was then created along the upper chest wall utilizing a combination of sharp and blunt dissection. The pocket was irrigated with sterile saline. A single lumen thin power injectable port was chosen for placement. The 8 Fr catheter was tunneled from the port pocket site to the venotomy incision. The port was placed in the pocket. The external catheter was trimmed to appropriate length. At the venotomy, an 8 Fr peel-away sheath was placed over a guidewire under fluoroscopic guidance. The catheter was then placed through the sheath and the sheath was removed. Final catheter positioning was confirmed and documented with a fluoroscopic spot radiograph. The port was accessed with a Huber needle, aspirated and flushed with heparinized saline. The venotomy site was closed with an interrupted 4-0 Vicryl suture. The port pocket incision was closed with interrupted 2-0 Vicryl suture and the skin was opposed with a running subcuticular 4-0 Vicryl suture. Dermabond and Steri-strips were applied to both incisions. Dressings were placed. The patient tolerated the procedure well without immediate post procedural complication. FINDINGS: After catheter placement, the tip lies within the superior cavoatrial junction. The catheter aspirates and flushes normally and is ready for immediate use. IMPRESSION: Successful placement of a right internal jugular approach power injectable Port-A-Cath. The catheter is ready for immediate use. Electronically Signed   By: Sandi Mariscal M.D.   On: 06/11/2016 15:42    Medications: I have reviewed the patient's current  medications.  Assessment/Plan: 1. 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 Radiation completed 06/10/2014  PET scan 07/01/2014 revealed improvement in the hypermetabolic esophagus mass and mediastinal lymphadenopathy  upper endoscopy 04/20/2015 -mid esophagus with mild narrowing , 2 small nodules measuring approximate 5 mm -biopsy with scant and detached fragments with high-grade squamous dysplasia , brushings negative for malignant cells.  CT chest/abdomen/pelvis 05/25/2015 with development of bilateral pulmonary nodules; paratracheal and subcarinal nodes improved/similar; new small lower paraesophageal nodes which are indeterminate; esophageal wall thickening improved since the prior PET; no evidence of metastatic disease in the abdomen or pelvis.  PET scan 06/16/2015 revealed hypermetabolic lung nodules, hypermetabolic mediastinal nodes, hypermetabolism at the mid to distal esophagus has improved  bronchoscopy/EBUS on 07/04/2015 confirmed squamous cell carcinoma involving right lower lobe brushings, right bronchial washings, a level 10 R lymph node biopsy, and right lower lobe biopsy  PD-L1 no expression on the right lower lobe biopsy (less than 1%)  Cycle 1 Pembrolizumab 07/26/2015  Cycle 2 Pembrolizumab 08/16/2015  Cycle 3 Pembrolizumab 09/06/2015  Cycle 4 Pembrolizumab 09/27/2015  CT chest 10/11/2015-stable/decreased size of pulmonary nodules, enlargement of several mediastinal nodes  Cycle 5 Pembrolizumab 10/18/2015  Cycle 6 Pembrolizumab 11/08/2015   Cycle 7 Pembrolizumab08/05/2015  Cycle 8 Pembrolizumab08/23/2017  CT chest 01/15/2016-slight  progression of mediastinal/upper abdominal lymphadenopathy and enlargement of a right lower lobe nodule  Cycle 9 Pembrolizumab 01/17/2016   Cycle 10 Pembrolizumab10/02/2016  Cycle 11 Pembrolizumab 02/28/2016  Cycle 12 Pembrolizumab 03/20/2016  CT chest 04/25/2016-minimal progression of chest lymphadenopathy and lung nodules, thickening at the mid thoracic esophagus  MRI 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 Solid/liquid dysphagia secondary to #1  Jejunostomy feeding tube placement 04/07/2014  Feeding tube removed 05/30/2014.  3. Small bowel obstruction following placement of the jejunostomy feeding tube-resolved  4. History of ulcerative colitis  5. Aspiration pneumonia December 2015  6. 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 Fever/cough/dyspnea  04/30/2016-nasal swab positive for influenza A , chest x-ray with a left lower lobe  Pneumonia -treated with Tamiflu and Levaquin      Disposition:  Mr. Councilman appears acutely ill. I am concerned he has developed a pulmonary embolism. I reviewed the chest x-ray from today and I can appreciate no acute finding. I have a low clinical suspicion for pneumonia or tumor progression in the chest. He will be referred to the emergency room for other evaluation. I ordered a stat CT angiogram of the chest. The first cycle of FOLFOX will be rescheduled.  Felt "dizzy "with ambulation in the clinic today.  He will require hospital admission if a pulmonary embolism is confirmed.  25 minutes were spent with the patient today. The majority of the time was used for counseling and coordination of care.  Betsy Coder, MD  06/13/2016  5:33 PM

## 2016-06-13 NOTE — Telephone Encounter (Signed)
Spoke with pt's wife, she reports patient is very weak this morning. Feels like his pneumonia is getting worse. "Chest hurts where the pneumonia is." Pt has productive cough, they have not checked temperature.

## 2016-06-13 NOTE — ED Provider Notes (Signed)
[facility] DEPT Provider Note   CSN: [id number] Arrival date & time: 06/13/16  1811     History   Chief Complaint Chief Complaint  Patient presents with  . Fever  . Cough  . Cancer  . Chest Pain  . Tachycardia    HPI Billy Romero is a 61 y.o. male.  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 He's presenting today with cough, shortness of breath. Patient diagnosed with pneumonia and flu last week. Patient took medications but has continued to have increasing cough and shortness of breath. Patient notes only low-grade fever. No nausea no vomiting or diarrhea.  Past Medical History:  Diagnosis Date  . Chronic back pain   . Complication of anesthesia    WOKE UP DURING COLONOSCOPY  . Esophageal cancer ([place]) 03/29/14  . GERD (gastroesophageal reflux disease)   . Pneumonia    May 27, 2016, was + for flu also  . Primary cancer of esophagus with metastasis to other site [facility]) 07/04/2015    Patient Active Problem List   Diagnosis Date Noted  . Acute pulmonary embolism ([place]) 06/13/2016  . SIRS (systemic inflammatory response syndrome) ([place]) 06/13/2016  . Goals of care, counseling/discussion 06/06/2016  . Bone metastasis ([place]) 05/03/2016  . Syncope 04/30/2016  . 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 Nausea with vomiting   . Ileus, postoperative ([place]) 04/15/2014  . SBO (small bowel obstruction)   . Chronic back pain 04/05/2014  . Dysphagia, pharyngoesophageal phase 04/05/2014  . Protein-calorie malnutrition, severe ([place]) 04/05/2014  . Dysphagia 04/05/2014  . Malnutrition of moderate degree ([place]) 04/05/2014    Past Surgical History:  Procedure Laterality Date  . APPENDECTOMY    . CERVICAL FUSION    . EYE SURGERY     PTEGERIUM   BIL EYES  . HAND SURGERY     DUPRAGENS CONTRACTURE  RT HAND  . IR GENERIC HISTORICAL  06/11/2016   IR US GUIDE VASC ACCESS  RIGHT 06/11/2016 [facility]-INTERV RAD  . IR GENERIC HISTORICAL  06/11/2016   IR FLUORO GUIDE PORT INSERTION RIGHT 06/11/2016 [facility]INTER[facility]  . KNEE ARTHROSCOPY     LEFT  . PEG PLACEMENT N/A 04/06/2014   Procedure: OPEN JEJUNOSTOMY TUBE PLACEMENT;  Surgeon: Armandina Gemma, MD;  Location: [facility] ORS;  Service: General;  Laterality: N/A;  . VIDEO BRONCHOSCOPY WITH ENDOBRONCHIAL ULTRASOUND N/A 07/04/2015   Procedure: VIDEO BRONCHOSCOPY WITH ENDOBRONCHIAL ULTRASOUND with brushings and biopsies.;  Surgeon: Grace Isaac, MD;  Location: [facility] OR;  Service: Thoracic;  Laterality: N/A;       Home Medications    Prior to Admission medications   Medication Sig Start Date End Date Taking? Authorizing Provider  baclofen (LIORESAL) 10 MG tablet Take 10 mg by mouth at bedtime.  01/12/15  Yes Historical Provider, MD  esomeprazole (NEXIUM) 20 MG capsule Take 20 mg by mouth daily at 12 noon.   Yes Historical Provider, MD  HYDROcodone-acetaminophen (NORCO/VICODIN) 5-325 MG tablet Take 1 tablet by mouth 3 (three) times daily as needed for moderate pain. for pain 12/20/15  Yes Ladell Pier, MD  diclofenac sodium (VOLTAREN) 1 % GEL Apply 4 g topically 4 (four) times daily as needed (pain).  03/27/16   Historical Provider, MD    Family History Family History  Problem Relation Age of Onset  . Cancer Father     Social History Social History  Substance Use  Topics  . Smoking status: Never Smoker  . Smokeless tobacco: Never Used  . Alcohol use No     Allergies   Patient has no known allergies.   Review of Systems Review of Systems  Constitutional: Positive for fatigue and fever. Negative for activity change.  HENT: Positive for congestion.   Respiratory: Positive for cough, chest tightness and shortness of breath.   Cardiovascular: Positive for chest pain.  Gastrointestinal: Negative for abdominal pain.  Neurological: Negative for dizziness and syncope.  All other systems reviewed and are  negative.    Physical Exam Updated Vital Signs BP 109/75 (BP Location: Left Arm)   Pulse (!) 121   Temp (!) 100.6 F (38.1 C) (Oral)   Resp 20   Ht '5\' 11"'$  (1.803 m)   Wt 190 lb 14.7 oz (86.6 kg)   SpO2 96%   BMI 26.63 kg/m   Physical Exam  Constitutional: He is oriented to person, place, and time. He appears well-nourished.  HENT:  Head: Normocephalic.  Eyes: Conjunctivae are normal.  Cardiovascular:  Tachycardia.  Pulmonary/Chest: Effort normal and breath sounds normal.  Mild tachypnea. Port in place.  Abdominal: Soft. He exhibits no distension. There is no tenderness.  Neurological: He is oriented to person, place, and time.  Skin: Skin is warm and dry. He is not diaphoretic.  Psychiatric: He has a normal mood and affect. His behavior is normal.     ED Treatments / Results  Labs (all labs ordered are listed, but only abnormal results are displayed) Labs Reviewed  COMPREHENSIVE METABOLIC PANEL - Abnormal; Notable for the following:       Result Value   Chloride 98 (*)    Glucose, Bld 116 (*)    Albumin 3.2 (*)    ALT 14 (*)    Total Bilirubin 1.4 (*)    All other components within normal limits  CBC WITH DIFFERENTIAL/PLATELET - Abnormal; Notable for the following:    HCT 37.8 (*)    All other components within normal limits  APTT - Abnormal; Notable for the following:    aPTT 124 (*)    All other components within normal limits  CULTURE, BLOOD (ROUTINE X 2)  CULTURE, BLOOD (ROUTINE X 2)  CULTURE, EXPECTORATED SPUTUM-ASSESSMENT  HEPARIN LEVEL (UNFRACTIONATED)  CBC  BASIC METABOLIC PANEL  TROPONIN I  TROPONIN I  TROPONIN I  I-STAT CG4 LACTIC ACID, ED  I-STAT TROPOININ, ED  I-STAT CG4 LACTIC ACID, ED    EKG  EKG Interpretation None       Radiology Dg Chest 2 View  Result Date: 06/13/2016 CLINICAL DATA:  Persistent cough for 2 weeks and weakness. Pleuritic left chest pain. History esophageal cancer. EXAM: CHEST  2 VIEW COMPARISON:  05/27/2016  FINDINGS: A right jugular Port-A-Cath has been placed and terminates over the lower SVC. The cardiomediastinal silhouette is within normal limits. There is mild patchy and nodular opacity in the left lung base which is new or increased from the prior study. The right lung is clear. No pleural effusion or pneumothorax is identified. Thoracic spondylosis is noted. IMPRESSION: New mild left basilar opacity which may reflect atelectasis or early pneumonia. Follow-up radiographs are recommended to ensure resolution given the mildly nodular appearance and history of esophageal cancer. Electronically Signed   By: Logan Bores M.D.   On: 06/13/2016 17:56   Ct Angio Chest Pe W And/or Wo Contrast  Result Date: 06/13/2016 CLINICAL DATA:  61 y/o M; generalized weakness, tachycardia, left-sided chest pain. Esophageal  cancer post radiation 01/18. Recent port placement. Treated for fluid pneumonia 1 week ago. EXAM: CT ANGIOGRAPHY CHEST WITH CONTRAST TECHNIQUE: Multidetector CT imaging of the chest was performed using the standard protocol during bolus administration of intravenous contrast. Multiplanar CT image reconstructions and MIPs were obtained to evaluate the vascular anatomy. CONTRAST:  100 cc Isovue 370 COMPARISON:  06/13/2016 chest radiograph.  04/25/2016 chest CT. FINDINGS: Cardiovascular: Satisfactory opacification of pulmonary arteries. Segmental pulmonary embolus within the left lower lobe basal lateral segmental artery (series 7, image 142). RV/ LV index = 0.8. Normal size main pulmonary artery and thoracic aorta. Normal heart size. Small pericardial effusion. Mediastinum/Nodes: Mediastinal lymphadenopathy is stable or increased in size in comparison with the prior CT of the chest for example an aortopulmonary lymph node measures 19 mm short axis, previously 17 mm (series 7, image 102) ; a left hilar node measures 15 mm, previously 13 mm (series 7, image 124). Additionally, there is progressive narrowing of  multiple pulmonary artery is and pulmonary veins likely due to tumor infiltration for example in the left upper lobe lingular pulmonary artery origin (series 7, image 110). Stable thickening of the wall of the mid esophagus. Lungs/Pleura: There is stenosis or occlusion of multiple lobar and segmental bronchi due to mass effect from hilar adenopathy which has progressed from the prior CT. Left upper lobe pulmonary nodules are increased in size for example along the left major fissure measuring 13 mm, previously 9 mm (series 8, image 47). There has been interval development of nodular opacities within the hilar region of the right lower lobe with septal thickening and peribronchial thickening suspicious for lymphangitic carcinomatosis. Small left pleural effusion. Paramediastinal reticulation may represent posttreatment changes. Upper Abdomen: Stable gastrohepatic lymphadenopathy measuring 16 mm short axis (series 7, image 237). Musculoskeletal: No acute osseous abnormality is identified. Review of the MIP images confirms the above findings. IMPRESSION: 1. Positive for pulmonary embolus of a segmental left lower lobe pulmonary artery. No evidence for right heart strain. 2. Increasing mediastinal and hilar lymphadenopathy with progressive stenosis of multiple pulmonary artery and bronchial lobar and segmental branch origins. 3. 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 4. Stable wall thickening of mid esophagus. 5. Paramediastinal reticulation of the lungs may represent radiation changes. 6. Stable gastrohepatic lymphadenopathy. These results were called by telephone at the time of interpretation on 06/13/2016 at 8:54 pm to Dr. Zenovia Jarred , who verbally acknowledged these results. Electronically Signed   By: Kristine Garbe M.D.   On: 06/13/2016 20:58    Procedures Procedures (including critical care time)  Medications Ordered in ED Medications  sodium chloride 0.9 % injection (not administered)  iopamidol (ISOVUE-370) 76 % injection (not administered)  heparin ADULT infusion 100 units/mL (25000 units/261m sodium chloride 0.45%) (1,450 Units/hr Intravenous New Bag/Given 06/13/16 2148)  pantoprazole (PROTONIX) EC tablet 40 mg (40 mg Oral Given 06/13/16 2321)  diclofenac sodium (VOLTAREN) 1 % transdermal gel 4 g (not administered)  HYDROcodone-acetaminophen (NORCO/VICODIN) 5-325 MG per tablet 1-2 tablet (not administered)  baclofen (LIORESAL) tablet 10 mg (10 mg Oral Given 06/13/16 2309)  sodium chloride flush (NS) 0.9 % injection 3 mL (3 mLs Intravenous Given 06/13/16 2310)  sodium chloride flush (NS) 0.9 % injection 3 mL (3 mLs Intravenous Given 06/13/16 2309)  sodium chloride flush (NS) 0.9 % injection 3 mL (not administered)  0.9 %  sodium  chloride infusion (not administered)  acetaminophen (TYLENOL) tablet 650 mg (650 mg Oral Given 06/13/16 2309)    Or  acetaminophen (TYLENOL) suppository 650 mg ( Rectal See Alternative 06/13/16 2309)  polyethylene glycol (MIRALAX / GLYCOLAX) packet 17 g (not administered)  bisacodyl (DULCOLAX) EC tablet 5 mg (not administered)  ondansetron (ZOFRAN) tablet 4 mg (not administered)    Or  ondansetron (ZOFRAN) injection 4 mg (not administered)  famotidine (PEPCID) IVPB 20 mg premix (20 mg Intravenous Given 06/13/16 2321)  azithromycin (ZITHROMAX) 500 mg in dextrose 5 % 250 mL IVPB (500 mg Intravenous Given 06/13/16 2321)  feeding supplement (ENSURE ENLIVE) (ENSURE ENLIVE) liquid 237 mL (not administered)  morphine 4 MG/ML injection 2 mg (2 mg Intravenous Given 06/13/16 2308)  sodium chloride 0.9 % bolus 1,000 mL (0 mLs Intravenous Stopped 06/13/16 2012)  iopamidol (ISOVUE-370) 76 % injection 100 mL (100 mLs Intravenous Contrast Given 06/13/16 2026)  heparin bolus  via infusion 4,000 Units (4,000 Units Intravenous Bolus from Bag 06/13/16 2148)     Initial Impression / Assessment and Plan / ED Course  I have reviewed the triage vital signs and the nursing notes.  Pertinent labs & imaging results that were available during my care of the patient were reviewed by me and considered in my medical decision making (see chart for details).     Patient is 61 year old male with history of esophageal cancer with metastases metastatic. Patient to be starting chemotherapy soon, port in place. Patient's had 2 weeks of increasing shortness of breath or cough. Treated for flu and outpatient pneumonia with Levaquin. Patient noted that he still is short of breath, with cough. Given his risk factors for PE we'll get CT and she'll.  12:58 AM CT angios shows small for pulmonary embolism and is versus lymphoid carcinomatosis.  Will admit for heparin and conitnued eval.  CRITICAL CARE Performed by: Gardiner Sleeper Total critical care time: 45 minutes Critical care time was exclusive of separately billable procedures and treating other patients. Critical care was necessary to treat or prevent imminent or life-threatening deterioration. Critical care was time spent personally by me on the following activities: development of treatment plan with patient and/or surrogate as well as nursing, discussions with consultants, evaluation of patient's response to treatment, examination of patient, obtaining history from patient or surrogate, ordering and performing treatments and interventions, ordering and review of laboratory studies, ordering and review of radiographic studies, pulse oximetry and re-evaluation of patient's condition.   Final Clinical Impressions(s) / ED Diagnoses   Final diagnoses:  None    New Prescriptions Current Discharge Medication List       Maizey Menendez Julio Alm, MD 06/14/16 [phone]

## 2016-06-13 NOTE — Telephone Encounter (Signed)
Discussed call with Dr. Benay Spice: Pt needs to be seen here or by PCP. Returned call to pt, he has not checked temp. He remains weak and feels like pneumonia hasn't resolved. Continues to have sharp pain in L chest-unchanged for past 10 days per pt. 1527: Spoke with pt's wife: Come in now for chest Xray and office visit, per Dr. Benay Spice. They will get here ASAP.

## 2016-06-13 NOTE — ED Notes (Signed)
Bed: CQ19 Expected date:  Expected time:  Means of arrival:  Comments: Cancer center pt

## 2016-06-13 NOTE — Progress Notes (Signed)
ANTICOAGULATION CONSULT NOTE - Initial Consult  Pharmacy Consult for IV Heparin Indication: pulmonary embolus  No Known Allergies  Patient Measurements: Height: '5\' 11"'$  (180.3 cm) Weight: 197 lb (89.4 kg) IBW/kg (Calculated) : 75.3 Heparin Dosing Weight: actual body weight  Vital Signs: Temp: 99.8 F (37.7 C) (02/15 2046) Temp Source: Oral (02/15 2046) BP: 118/84 (02/15 2046) Pulse Rate: 108 (02/15 2046)  Labs:  Recent Labs  06/11/16 1152 06/13/16 1916  HGB 14.0 13.3  HCT 40.1 37.8*  PLT 265 227  LABPROT 13.9  --   INR 1.07  --   CREATININE  --  1.10    Estimated Creatinine Clearance: 74.2 mL/min (by C-G formula based on SCr of 1.1 mg/dL).   Medical History: Past Medical History:  Diagnosis Date  . Chronic back pain   . Complication of anesthesia    WOKE UP DURING COLONOSCOPY  . Esophageal cancer ([place]) 03/29/14  . GERD (gastroesophageal reflux disease)   . Pneumonia    May 27, 2016, was + for flu also  . Primary cancer of esophagus with metastasis to other site [facility]) 07/04/2015    Assessment: 22 yoM with esophageal cancer was scheduled to begin FOLFOX chemotherapy this morning. He underwent placement of a Port-A-Cath 06/11/2016 but was feeling weak with chest pain today. CTA chest Positive for pulmonary embolus of a segmental left lower lobe pulmonary artery. No evidence for right heart strain.  Pharmacy consulted to start heparin.  Baseline INR 1.07, aPTT ordered CBC: Hgb, platelets WNL CrCl~74 ml/min  Goal of Therapy:  Heparin level 0.3-0.7 units/ml Monitor platelets by anticoagulation protocol: Yes   Plan:  Heparin 4000 unit IV bolus then 1450 units/hr. Check heparin level in 6 hours. Daily HL and CBC while on heparin infusion.  Hershal Coria 06/13/2016,9:03 PM

## 2016-06-14 ENCOUNTER — Inpatient Hospital Stay (HOSPITAL_COMMUNITY): Payer: BLUE CROSS/BLUE SHIELD

## 2016-06-14 DIAGNOSIS — C7801 Secondary malignant neoplasm of right lung: Secondary | ICD-10-CM

## 2016-06-14 DIAGNOSIS — I2699 Other pulmonary embolism without acute cor pulmonale: Principal | ICD-10-CM

## 2016-06-14 DIAGNOSIS — R05 Cough: Secondary | ICD-10-CM

## 2016-06-14 DIAGNOSIS — H532 Diplopia: Secondary | ICD-10-CM

## 2016-06-14 DIAGNOSIS — Z86711 Personal history of pulmonary embolism: Secondary | ICD-10-CM

## 2016-06-14 DIAGNOSIS — C154 Malignant neoplasm of middle third of esophagus: Secondary | ICD-10-CM

## 2016-06-14 DIAGNOSIS — R079 Chest pain, unspecified: Secondary | ICD-10-CM

## 2016-06-14 DIAGNOSIS — R6510 Systemic inflammatory response syndrome (SIRS) of non-infectious origin without acute organ dysfunction: Secondary | ICD-10-CM

## 2016-06-14 DIAGNOSIS — C771 Secondary and unspecified malignant neoplasm of intrathoracic lymph nodes: Secondary | ICD-10-CM

## 2016-06-14 LAB — CBC
HEMATOCRIT: 33.4 % — AB (ref 39.0–52.0)
HEMOGLOBIN: 11.6 g/dL — AB (ref 13.0–17.0)
MCH: 30 pg (ref 26.0–34.0)
MCHC: 34.7 g/dL (ref 30.0–36.0)
MCV: 86.3 fL (ref 78.0–100.0)
Platelets: 194 10*3/uL (ref 150–400)
RBC: 3.87 MIL/uL — ABNORMAL LOW (ref 4.22–5.81)
RDW: 13.1 % (ref 11.5–15.5)
WBC: 7.3 10*3/uL (ref 4.0–10.5)

## 2016-06-14 LAB — TROPONIN I
Troponin I: <0.03 ng/mL (ref <0.03)
Troponin I: <0.03 ng/mL (ref <0.03)

## 2016-06-14 LAB — ECHOCARDIOGRAM COMPLETE
AVLVOTPG: 7 mmHg
CHL CUP MV DEC (S): 215
CHL CUP RV SYS PRESS: 30 mmHg
CHL CUP TV REG PEAK VELOCITY: 259 cm/s
E decel time: 215 msec
E/e' ratio: 12
FS: 32 % (ref 28–44)
Height: 71 in
IV/PV OW: 0.94
LA diam end sys: 23 mm
LA diam index: 1.1 cm/m2
LA vol A4C: 45.5 ml
LA vol: 37.2 mL
LASIZE: 23 mm
LAVOLIN: 17.8 mL/m2
LV E/e' medial: 12
LV E/e'average: 12
LV TDI E'MEDIAL: 5.98
LV e' LATERAL: 6.31 cm/s
LVOT SV: 62 mL
LVOT VTI: 22 cm
LVOT area: 2.84 cm2
LVOTD: 19 mm
LVOTPV: 134 cm/s
Lateral S' vel: 13.4 cm/s
MV Peak grad: 2 mmHg
MV pk E vel: 75.7 m/s
MVPKAVEL: 106 m/s
P 1/2 time: 422 ms
PW: 9.83 mm — AB (ref 0.6–1.1)
RV TAPSE: 24.9 mm
TDI e' lateral: 6.31
TR max vel: 259 cm/s
WEIGHTICAEL: 3054.69 [oz_av]

## 2016-06-14 LAB — BASIC METABOLIC PANEL
Anion gap: 6 (ref 5–15)
BUN: 9 mg/dL (ref 6–20)
CALCIUM: 8.9 mg/dL (ref 8.9–10.3)
CO2: 25 mmol/L (ref 22–32)
CREATININE: 0.8 mg/dL (ref 0.61–1.24)
Chloride: 105 mmol/L (ref 101–111)
GFR calc Af Amer: >60 mL/min (ref >60)
GLUCOSE: 112 mg/dL — AB (ref 65–99)
Potassium: 3.6 mmol/L (ref 3.5–5.1)
SODIUM: 136 mmol/L (ref 135–145)

## 2016-06-14 LAB — EXPECTORATED SPUTUM ASSESSMENT W REFEX TO RESP CULTURE

## 2016-06-14 LAB — EXPECTORATED SPUTUM ASSESSMENT W GRAM STAIN, RFLX TO RESP C

## 2016-06-14 LAB — HEPARIN LEVEL (UNFRACTIONATED): Heparin Unfractionated: 0.87 IU/mL — ABNORMAL HIGH (ref 0.30–0.70)

## 2016-06-14 LAB — GLUCOSE, CAPILLARY: Glucose-Capillary: 107 mg/dL — ABNORMAL HIGH (ref 65–99)

## 2016-06-14 MED ORDER — SODIUM CHLORIDE 0.9% FLUSH
10.0000 mL | INTRAVENOUS | Status: DC | PRN
  Administered 2016-06-15: 10 mL
  Filled 2016-06-14: qty 40

## 2016-06-14 MED ORDER — PREMIER PROTEIN SHAKE
11.0000 [oz_av] | Freq: Two times a day (BID) | ORAL | Status: DC
  Administered 2016-06-14: 11 [oz_av] via ORAL
  Filled 2016-06-14 (×3): qty 325.31

## 2016-06-14 MED ORDER — ENOXAPARIN SODIUM 100 MG/ML ~~LOC~~ SOLN
90.0000 mg | Freq: Two times a day (BID) | SUBCUTANEOUS | Status: DC
  Administered 2016-06-14 (×2): 90 mg via SUBCUTANEOUS
  Filled 2016-06-14 (×2): qty 1

## 2016-06-14 NOTE — Progress Notes (Addendum)
Initial Nutrition Assessment  DOCUMENTATION CODES:   Non-severe (moderate) malnutrition in context of acute on chronic illness  INTERVENTION:   Ensure Enlive po BID, each supplement provides 350 kcal and 20 grams of protein  Premier Protein BID, each supplement provides 160kcal and 30g protein.   Magic cup TID with meals, each supplement provides 290 kcal and 9 grams of protein  snacks  NUTRITION DIAGNOSIS:   Malnutrition related to acute illness, cancer and cancer related treatments as evidenced by moderate to severe depletions of muscle mass, 4 percent weight loss in one week.  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 He's presenting today with cough, shortness of breath. Patient diagnosed with pneumonia and flu last week. Patient took medications but has continued to have increasing cough and shortness of breath. Patient notes only low-grade fever. No nausea no vomiting or diarrhea. Found to have PE   Met with pt in room today. Pt reports poor appetite and nausea for 3 days pta. Pt reports that his appetite is returning and that he is eating 75% of meals currently but reports he is still having nausea in waves. Per chart, pt has lost 7lbs(4%) in 1 week. This is significant. RD discussed with pt the importance of adequate protein intake and being well nourished prior to chemotherapy. Pt has moderate to severe muscle wasting in his lower legs and around his spine. Pt was diagnosed with esophageal cancer in 2015. In 2017, pt was noted to have pulmonary metastasis and in 2018 pt was noted to have skull metastasis and underwent palliative radiation. Pt was scheduled to start chemo on day of admission but was  rescheduled by oncology. RD will order variety of supplements for pt to try. Pt reports being tired of Ensure.    Medications reviewed and include: zithromax, pepcid, protonix, morphine  Labs reviewed: alb 3.2(L)  Nutrition-Focused physical exam completed. Findings are no fat depletion, moderate to severe muscle depletion in lower legs, and no edema.   Diet Order:  Diet regular Room service appropriate? Yes; Fluid consistency: Thin  Skin:  Reviewed, no issues  Last BM:  2/15  Height:   Ht Readings from Last 1 Encounters:  06/13/16 5' 11"  (1.803 m)    Weight:   Wt Readings from Last 1 Encounters:  06/13/16 190 lb 14.7 oz (86.6 kg)    Ideal Body Weight:  78.1 kg  BMI:  Body mass index is 26.63 kg/m.  Estimated Nutritional Needs:   Kcal:  2000-2300kcal/day   Protein:  112-129g/day   Fluid:  >2L/day   EDUCATION NEEDS:   No education needs identified at this time  Koleen Distance, RD, LDN Pager #[phone]

## 2016-06-14 NOTE — Progress Notes (Signed)
IP PROGRESS NOTE  Subjective:   He reports feeling better compared to when I saw him yesterday afternoon. He continues to have a cough and pain at the left anterior chest. He continues to have diplopia with distance vision.  Objective: Vital signs in last 24 hours: Blood pressure 107/67, pulse 96, temperature 97.8 F (36.6 C), temperature source Axillary, resp. rate 18, height 5' 11"  (1.803 m), weight 190 lb 14.7 oz (86.6 kg), SpO2 95 %.  Intake/Output from previous day: 02/15 0701 - 02/16 0700 In: 671.9 [P.O.:240; I.V.:131.9; IV Piggyback:300] Out: -   Physical Exam:  Lungs: Inspiratory rub at the left mid anterior chest, the lungs are otherwise clear, no respiratory distress Cardiac: Regular rate and rhythm Abdomen: No hepatosplenomegaly, no mass Extremities: No leg edema   Portacath/PICC-without erythema  Lab Results:  Recent Labs  06/13/16 1916 06/14/16 0339  WBC 9.6 7.3  HGB 13.3 11.6*  HCT 37.8* 33.4*  PLT 227 194    BMET  Recent Labs  06/13/16 1916 06/14/16 0339  NA 136 136  K 3.9 3.6  CL 98* 105  CO2 28 25  GLUCOSE 116* 112*  BUN 10 9  CREATININE 1.10 0.80  CALCIUM 9.6 8.9    Studies/Results: Dg Chest 2 View  Result Date: 06/13/2016 CLINICAL DATA:  Persistent cough for 2 weeks and weakness. Pleuritic left chest pain. History esophageal cancer. EXAM: CHEST  2 VIEW COMPARISON:  05/27/2016 FINDINGS: A right jugular Port-A-Cath has been placed and terminates over the lower SVC. The cardiomediastinal silhouette is within normal limits. There is mild patchy and nodular opacity in the left lung base which is new or increased from the prior study. The right lung is clear. No pleural effusion or pneumothorax is identified. Thoracic spondylosis is noted. IMPRESSION: New mild left basilar opacity which may reflect atelectasis or early pneumonia. Follow-up radiographs are recommended to ensure resolution given the mildly nodular appearance and history of  esophageal cancer. Electronically Signed   By: Logan Bores M.D.   On: 06/13/2016 17:56   Ct Angio Chest Pe W And/or Wo Contrast  Result Date: 06/13/2016 CLINICAL DATA:  61 y/o M; generalized weakness, tachycardia, left-sided chest pain. Esophageal cancer post radiation 01/18. Recent port placement. Treated for fluid pneumonia 1 week ago. EXAM: CT ANGIOGRAPHY CHEST WITH CONTRAST TECHNIQUE: Multidetector CT imaging of the chest was performed using the standard protocol during bolus administration of intravenous contrast. Multiplanar CT image reconstructions and MIPs were obtained to evaluate the vascular anatomy. CONTRAST:  100 cc Isovue 370 COMPARISON:  06/13/2016 chest radiograph.  04/25/2016 chest CT. FINDINGS: Cardiovascular: Satisfactory opacification of pulmonary arteries. Segmental pulmonary embolus within the left lower lobe basal lateral segmental artery (series 7, image 142). RV/ LV index = 0.8. Normal size main pulmonary artery and thoracic aorta. Normal heart size. Small pericardial effusion. Mediastinum/Nodes: Mediastinal lymphadenopathy is stable or increased in size in comparison with the prior CT of the chest for example an aortopulmonary lymph node measures 19 mm short axis, previously 17 mm (series 7, image 102) ; a left hilar node measures 15 mm, previously 13 mm (series 7, image 124). Additionally, there is progressive narrowing of multiple pulmonary artery is and pulmonary veins likely due to tumor infiltration for example in the left upper lobe lingular pulmonary artery origin (series 7, image 110). Stable thickening of the wall of the mid esophagus. Lungs/Pleura: There is stenosis or occlusion of multiple lobar and segmental bronchi due to mass effect from hilar adenopathy which has progressed from  the prior CT. Left upper lobe pulmonary nodules are increased in size for example along the left major fissure measuring 13 mm, previously 9 mm (series 8, image 47). There has been interval  development of nodular opacities within the hilar region of the right lower lobe with septal thickening and peribronchial thickening suspicious for lymphangitic carcinomatosis. Small left pleural effusion. Paramediastinal reticulation may represent posttreatment changes. Upper Abdomen: Stable gastrohepatic lymphadenopathy measuring 16 mm short axis (series 7, image 237). Musculoskeletal: No acute osseous abnormality is identified. Review of the MIP images confirms the above findings. IMPRESSION: 1. Positive for pulmonary embolus of a segmental left lower lobe pulmonary artery. No evidence for right heart strain. 2. Increasing mediastinal and hilar lymphadenopathy with progressive stenosis of multiple pulmonary artery and bronchial lobar and segmental branch origins. 3. 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 4. Stable wall thickening of mid esophagus. 5. Paramediastinal reticulation of the lungs may represent radiation changes. 6. Stable gastrohepatic lymphadenopathy. These results were called by telephone at the time of interpretation on 06/13/2016 at 8:54 pm to Dr. Zenovia Jarred , who verbally acknowledged these results. Electronically Signed   By: Kristine Garbe M.D.   On: 06/13/2016 20:58    Medications: I have reviewed the patient's current medications.  Assessment/Plan: 1. 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 Radiation completed 06/10/2014  PET scan 07/01/2014 revealed  improvement in the hypermetabolic esophagus mass and mediastinal lymphadenopathy  upper endoscopy 04/20/2015 -mid esophagus with mild narrowing , 2 small nodules measuring approximate 5 mm -biopsy with scant and detached fragments with high-grade squamous dysplasia , brushings negative for malignant cells.  CT chest/abdomen/pelvis 05/25/2015 with development of bilateral pulmonary nodules; paratracheal and subcarinal nodes improved/similar; new small lower paraesophageal nodes which are indeterminate; esophageal wall thickening improved since the prior PET; no evidence of metastatic disease in the abdomen or pelvis.  PET scan 06/16/2015 revealed hypermetabolic lung nodules, hypermetabolic mediastinal nodes, hypermetabolism at the mid to distal esophagus has improved  bronchoscopy/EBUS on 07/04/2015 confirmed squamous cell carcinoma involving right lower lobe brushings, right bronchial washings, a level 10 R lymph node biopsy, and right lower lobe biopsy  PD-L1 no expression on the right lower lobe biopsy (less than 1%)  Cycle 1 Pembrolizumab 07/26/2015  Cycle 2 Pembrolizumab 08/16/2015  Cycle 3 Pembrolizumab 09/06/2015  Cycle 4 Pembrolizumab 09/27/2015  CT chest 10/11/2015-stable/decreased size of pulmonary nodules, enlargement of several mediastinal nodes  Cycle 5 Pembrolizumab 10/18/2015  Cycle 6 Pembrolizumab 11/08/2015   Cycle 7 Pembrolizumab08/05/2015  Cycle 8 Pembrolizumab08/23/2017  CT chest 01/15/2016-slight progression of mediastinal/upper abdominal lymphadenopathy and enlargement of a right lower lobe nodule  Cycle 9 Pembrolizumab 01/17/2016   Cycle 10 Pembrolizumab10/02/2016  Cycle 11 Pembrolizumab 02/28/2016  Cycle 12 Pembrolizumab 03/20/2016  CT chest 04/25/2016-minimal progression of chest lymphadenopathy and lung nodules, thickening at the mid thoracic esophagus  MRI brain 04/30/2016-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 Solid/liquid dysphagia secondary to #1  Jejunostomy feeding tube placement 04/07/2014  Feeding tube removed 05/30/2014.  3. Small bowel obstruction following placement of the jejunostomy feeding tube-resolved  4. History of ulcerative colitis  5. Aspiration pneumonia December 2015  6. 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 Fever/cough/dyspnea 04/30/2016-nasal swab positive for influenza A , chest x-ray with a left lower lobe Pneumonia -treated with Tamiflu and Levaquin   8. Admission 06/13/2016 with a cough/left-sided chest pain/dyspnea/tachycardia/low-grade fever-pulmonary embolism confirmed on a chest CT, progressive chest adenopathy, inflammatory changes versus lymphatic tumor spread in the right lower lobe  9. 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 He was scheduled to begin a first cycle of salvage therapy with FOLFOX yesterday. He presented with a cough, tachycardia, and left-sided chest pain. He has been diagnosed with a left sided pulmonary was him. He is now on heparin.  The parenchymal changes in the right lung could represent tumor spread or infection. He does not appear to have a lobar pneumonia.  I discussed the CT findings with him. The plan is to proceed with FOLFOX chemotherapy next week. He will be scheduled for an outpatient appointment on 06/18/2016.  Recommendations:  1. Continue anticoagulation therapy, can convert to Xarelto at discharge 2. Follow-up lower extremity Dopplers 3. Outpatient follow-up at the Cancer center 06/18/2016  Please call  Oncology over the weekend as needed.        LOS: 1 day   Betsy Coder, MD   06/14/2016, 2:06 PM

## 2016-06-14 NOTE — Progress Notes (Signed)
ANTICOAGULATION CONSULT NOTE - Follow Up Consult  Pharmacy Consult for Hepatin Indication: pulmonary embolus  No Known Allergies  Patient Measurements: Height: '5\' 11"'$  (180.3 cm) Weight: 190 lb 14.7 oz (86.6 kg) IBW/kg (Calculated) : 75.3 Heparin Dosing Weight:   Vital Signs: Temp: 100.6 F (38.1 C) (02/15 2246) Temp Source: Oral (02/15 2246) BP: 109/75 (02/15 2246) Pulse Rate: 121 (02/15 2246)  Labs:  Recent Labs  06/11/16 1152 06/13/16 1916 06/13/16 2150 06/14/16 0128 06/14/16 0339  HGB 14.0 13.3  --   --  11.6*  HCT 40.1 37.8*  --   --  33.4*  PLT 265 227  --   --  194  APTT  --   --  124*  --   --   LABPROT 13.9  --   --   --   --   INR 1.07  --   --   --   --   HEPARINUNFRC  --   --   --   --  0.87*  CREATININE  --  1.10  --   --  0.80  TROPONINI  --   --   --  <0.03  --     Estimated Creatinine Clearance: 102 mL/min (by C-G formula based on SCr of 0.8 mg/dL).   Medications:  Infusions:  . heparin 1,450 Units/hr (06/13/16 2148)    Assessment: Patient with heparin level above goal.  No heparin issues per RN.  Goal of Therapy:  Heparin level 0.3-0.7 units/ml Monitor platelets by anticoagulation protocol: Yes   Plan:  Decrease heparin to 1350 units/hr Recheck level at [address] [place] 06/14/2016,4:35 AM

## 2016-06-14 NOTE — Progress Notes (Signed)
Echocardiogram 2D Echocardiogram has been performed.  Billy Romero 06/14/2016, 12:57 PM

## 2016-06-14 NOTE — Progress Notes (Signed)
*  PRELIMINARY RESULTS* Vascular Ultrasound Lower extremity venous duplex has been completed.  Preliminary findings: No evidence of DVT or baker's cyst.  Landry Mellow, RDMS, RVT  06/14/2016, 9:22 AM

## 2016-06-14 NOTE — Progress Notes (Signed)
ANTICOAGULATION CONSULT NOTE  Pharmacy Consult for Lovenox Indication: pulmonary embolus  No Known Allergies  Patient Measurements: Height: '5\' 11"'$  (180.3 cm) Weight: 190 lb 14.7 oz (86.6 kg) IBW/kg (Calculated) : 75.3  Vital Signs: Temp: 97.9 F (36.6 C) (02/16 0524) Temp Source: Oral (02/16 0524) BP: 101/66 (02/16 0524) Pulse Rate: 89 (02/16 0524)  Labs:  Recent Labs  06/11/16 1152 06/13/16 1916 06/13/16 2150 06/14/16 0128 06/14/16 0339 06/14/16 0845  HGB 14.0 13.3  --   --  11.6*  --   HCT 40.1 37.8*  --   --  33.4*  --   PLT 265 227  --   --  194  --   APTT  --   --  124*  --   --   --   LABPROT 13.9  --   --   --   --   --   INR 1.07  --   --   --   --   --   HEPARINUNFRC  --   --   --   --  0.87*  --   CREATININE  --  1.10  --   --  0.80  --   TROPONINI  --   --   --  <0.03  --  <0.03    Estimated Creatinine Clearance: 102 mL/min (by C-G formula based on SCr of 0.8 mg/dL).   Medical History: Past Medical History:  Diagnosis Date  . Chronic back pain   . Complication of anesthesia    WOKE UP DURING COLONOSCOPY  . Esophageal cancer ([place]) 03/29/14  . GERD (gastroesophageal reflux disease)   . Pneumonia    May 27, 2016, was + for flu also  . Primary cancer of esophagus with metastasis to other site [facility]) 07/04/2015    Assessment: 89 yoM with metastatic esophageal cancer, s/p chemotherapy treatments,  was scheduled to begin new regimen (FOLFOX) this morning. He underwent placement of a Port-A-Cath 06/11/2016 but was feeling weak with chest pain 06/13/16. CTA chest Positive for pulmonary embolus of a segmental left lower lobe pulmonary artery. No evidence for right heart strain.   Heparin was started on the evening of 06/13/16.  On the morning of 06/14/16 orders were received to transition heparin to Lovenox.  Hgb down slightly since admission but remains >11 Pltc WNL SCr WNL, estimated CrCl well above 30 mL/min   Goal of Therapy:  Full-dose  anticoagulation with Lovenox Monitor platelets by anticoagulation protocol: Yes   Plan:  1. DC heparin infusion 2. One hour later, begin Lovenox 90 mg (1 mg/kg) SQ q12h 3. CBC every 3 days while inpatient. 4. Follow clinical course.  Clayburn Pert, PharmD, BCPS Pager: [phone] 06/14/2016  11:40 AM

## 2016-06-14 NOTE — Progress Notes (Signed)
PROGRESS NOTE    Billy Romero  JGG:[id number] DOB: 10/01/1955 DOA: 06/13/2016 PCP: Lujean Amel, MD    Brief Narrative:  61 y.o. male with medical history significant for esophageal cancer with metastases, presenting to the emergency department at the direction of his oncologist for evaluation of fevers, weakness, tachycardia, and pleuritic pain. Patient reports that he was diagnosed with influenza and pneumonia approximately 2 weeks ago and has completed treatment with Tamiflu and Levaquin, but has not experienced any significant improvement. In fact, he reports worsening cough productive of brownish sputum and the new development of left lateral chest wall pain with deep inspiration or cough. He denies any lower extremity swelling or tenderness, denies orthopnea or PND, and denies any personal or family history of VTE. Patient had a Port-A-Cath placed on 06/11/2016 and was scheduled to begin treatment with FOLFOX today. He was noted to be acutely ill-appearing at the cancer center with heart rate in the 130s. His oncologist was very concerned for PE and the patient was sent to the emergency department for evaluation of this.   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 Acute pulmonary embolism  - Pt presents with worsening cough and pleuritic pain despite completing treatment for PNA  - He was seen at the [facility], noted to be tachycardic to the 130's, and directed to ED for CTA chest  - CTA is positive for segmental PE in LLL without evidence for right heart-strain  - Patient now on therapeutic lovenox  - LE dopplers neg for DVT - 2D echo pending  2. Esophageal cancer with metastases  - Diagnosed in December 2015 and started on Taxol, carboplatin, and radiation - He is also s/p treatment  with pembrolizumab in 2017  - Pulmonary metastases noted in 2017, and skull metastases noted in January 2018 and treated with palliative radiation - He was scheduled to start FOLFOX on day of admission, but will be rescheduled by oncology  - Stable at present   3. 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 GERD - Pt reports poorly-controlled sxs for the past week despite daily Nexium at home - Continue daily PPI; Pepcid BID was added  DVT prophylaxis: Lovenox subQ Code Status: Full Family Communication: Pt in room, family not at bedside Disposition Plan: Uncertain at this time  Consultants:   Oncology  Procedures:     Antimicrobials: Anti-infectives    Start     Dose/Rate Route Frequency Ordered Stop   06/13/16 2200  azithromycin (ZITHROMAX) 500 mg in dextrose 5 % 250 mL IVPB     500 mg 250 mL/hr over 60 Minutes Intravenous Every 24 hours 06/13/16 2148         Subjective: No complaints at present  Objective: Vitals:   06/13/16 2203 06/13/16 2246 06/14/16 0524 06/14/16 1323  BP: 121/84 109/75 101/66 107/67  Pulse: 114 (!) 121 89 96  Resp: '20 20 19 18  '$ Temp: 100.4 F (38 C) (!) 100.6 F (38.1 C) 97.9 F (36.6 C) 97.8 F (36.6 C)  TempSrc: Oral Oral Oral Axillary  SpO2: 96% 96% 96% 95%  Weight:  86.6 kg (190 lb  14.7 oz)    Height:  '5\' 11"'$  (1.803 m)      Intake/Output Summary (Last 24 hours) at 06/14/16 1455 Last data filed at 06/14/16 0700  Gross per 24 hour  Intake           671.91 ml  Output                0 ml  Net           671.91 ml   Filed Weights   06/13/16 1815 06/13/16 2246  Weight: 89.4 kg (197 lb) 86.6 kg (190 lb 14.7 oz)    Examination:  General exam: Appears calm and comfortable  Respiratory system: Clear to auscultation. Respiratory effort  normal. Cardiovascular system: S1 & S2 heard, RRR Gastrointestinal system: Abdomen is nondistended, soft and nontender. No organomegaly or masses felt. Normal bowel sounds heard. Central nervous system: Alert and oriented. No focal neurological deficits. Extremities: Symmetric 5 x 5 power. Skin: No rashes, lesions Psychiatry: Judgement and insight appear normal. Mood & affect appropriate.   Data Reviewed: I have personally reviewed following labs and imaging studies  CBC:  Recent Labs Lab 06/11/16 1152 06/13/16 1916 06/14/16 0339  WBC 8.7 9.6 7.3  NEUTROABS 7.0 7.7  --   HGB 14.0 13.3 11.6*  HCT 40.1 37.8* 33.4*  MCV 85.9 84.8 86.3  PLT 265 227 789   Basic Metabolic Panel:  Recent Labs Lab 06/13/16 1916 06/14/16 0339  NA 136 136  K 3.9 3.6  CL 98* 105  CO2 28 25  GLUCOSE 116* 112*  BUN 10 9  CREATININE 1.10 0.80  CALCIUM 9.6 8.9   GFR: Estimated Creatinine Clearance: 102 mL/min (by C-G formula based on SCr of 0.8 mg/dL). Liver Function Tests:  Recent Labs Lab 06/13/16 1916  AST 18  ALT 14*  ALKPHOS 108  BILITOT 1.4*  PROT 7.0  ALBUMIN 3.2*   No results for input(s): LIPASE, AMYLASE in the last 168 hours. No results for input(s): AMMONIA in the last 168 hours. Coagulation Profile:  Recent Labs Lab 06/11/16 1152  INR 1.07   Cardiac Enzymes:  Recent Labs Lab 06/14/16 0128 06/14/16 0845  TROPONINI <0.03 <0.03   BNP (last 3 results) No results for input(s): PROBNP in the last 8760 hours. HbA1C: No results for input(s): HGBA1C in the last 72 hours. CBG:  Recent Labs Lab 06/14/16 0724  GLUCAP 107*   Lipid Profile: No results for input(s): CHOL, HDL, LDLCALC, TRIG, CHOLHDL, LDLDIRECT in the last 72 hours. Thyroid Function Tests: No results for input(s): TSH, T4TOTAL, FREET4, T3FREE, THYROIDAB in the last 72 hours. Anemia Panel: No results for input(s): VITAMINB12, FOLATE, FERRITIN, TIBC, IRON, RETICCTPCT in the last 72 hours. Sepsis  Labs:  Recent Labs Lab 06/13/16 1930 06/13/16 2017  LATICACIDVEN 1.48 1.13    Recent Results (from the past 240 hour(s))  Blood culture (routine x 2)     Status: None (Preliminary result)   Collection Time: 06/13/16  7:16 PM  Result Value Ref Range Status   Specimen Description BLOOD RIGHT ANTECUBITAL  Final   Special Requests BOTTLES DRAWN AEROBIC AND ANAEROBIC 5CC  Final   Culture   Final    NO GROWTH < 12 HOURS Performed at [facility], [place] [address] [place], [place] [address]    Report Status PENDING  Incomplete  Blood culture (routine x 2)     Status: None (Preliminary result)   Collection Time: 06/13/16  8:08 PM  Result Value Ref  Range Status   Specimen Description BLOOD LEFT ANTECUBITAL  Final   Special Requests BOTTLES DRAWN AEROBIC AND ANAEROBIC 5CC  Final   Culture   Final    NO GROWTH < 12 HOURS Performed at [facility], [place] [address] [place], [place] [address]    Report Status PENDING  Incomplete  Culture, sputum-assessment     Status: None   Collection Time: 06/14/16  1:44 AM  Result Value Ref Range Status   Specimen Description SPUTUM  Final   Special Requests NONE  Final   Sputum evaluation THIS SPECIMEN IS ACCEPTABLE FOR SPUTUM CULTURE  Final   Report Status 06/14/2016 FINAL  Final  Culture, respiratory (NON-Expectorated)     Status: None (Preliminary result)   Collection Time: 06/14/16  1:44 AM  Result Value Ref Range Status   Specimen Description SPUTUM  Final   Special Requests NONE Reflexed from [id number]  Final   Gram Stain   Final    MODERATE SQUAMOUS EPITHELIAL CELLS PRESENT RARE WBC PRESENT, PREDOMINANTLY PMN MODERATE GRAM POSITIVE COCCI IN CHAINS MODERATE GRAM POSITIVE COCCI IN PAIRS MODERATE GRAM NEGATIVE RODS MODERATE GRAM POSITIVE RODS Performed at [facility], [place] [address], [place], [place] [address]    Culture PENDING  Incomplete   Report Status PENDING  Incomplete     Radiology Studies: Dg Chest 2  View  Result Date: 06/13/2016 CLINICAL DATA:  Persistent cough for 2 weeks and weakness. Pleuritic left chest pain. History esophageal cancer. EXAM: CHEST  2 VIEW COMPARISON:  05/27/2016 FINDINGS: A right jugular Port-A-Cath has been placed and terminates over the lower SVC. The cardiomediastinal silhouette is within normal limits. There is mild patchy and nodular opacity in the left lung base which is new or increased from the prior study. The right lung is clear. No pleural effusion or pneumothorax is identified. Thoracic spondylosis is noted. IMPRESSION: New mild left basilar opacity which may reflect atelectasis or early pneumonia. Follow-up radiographs are recommended to ensure resolution given the mildly nodular appearance and history of esophageal cancer. Electronically Signed   By: Logan Bores M.D.   On: 06/13/2016 17:56   Ct Angio Chest Pe W And/or Wo Contrast  Result Date: 06/13/2016 CLINICAL DATA:  61 y/o M; generalized weakness, tachycardia, left-sided chest pain. Esophageal cancer post radiation 01/18. Recent port placement. Treated for fluid pneumonia 1 week ago. EXAM: CT ANGIOGRAPHY CHEST WITH CONTRAST TECHNIQUE: Multidetector CT imaging of the chest was performed using the standard protocol during bolus administration of intravenous contrast. Multiplanar CT image reconstructions and MIPs were obtained to evaluate the vascular anatomy. CONTRAST:  100 cc Isovue 370 COMPARISON:  06/13/2016 chest radiograph.  04/25/2016 chest CT. FINDINGS: Cardiovascular: Satisfactory opacification of pulmonary arteries. Segmental pulmonary embolus within the left lower lobe basal lateral segmental artery (series 7, image 142). RV/ LV index = 0.8. Normal size main pulmonary artery and thoracic aorta. Normal heart size. Small pericardial effusion. Mediastinum/Nodes: Mediastinal lymphadenopathy is stable or increased in size in comparison with the prior CT of the chest for example an aortopulmonary lymph node  measures 19 mm short axis, previously 17 mm (series 7, image 102) ; a left hilar node measures 15 mm, previously 13 mm (series 7, image 124). Additionally, there is progressive narrowing of multiple pulmonary artery is and pulmonary veins likely due to tumor infiltration for example in the left upper lobe lingular pulmonary artery origin (series 7, image 110). Stable thickening of the wall of the mid esophagus. Lungs/Pleura: There  is stenosis or occlusion of multiple lobar and segmental bronchi due to mass effect from hilar adenopathy which has progressed from the prior CT. Left upper lobe pulmonary nodules are increased in size for example along the left major fissure measuring 13 mm, previously 9 mm (series 8, image 47). There has been interval development of nodular opacities within the hilar region of the right lower lobe with septal thickening and peribronchial thickening suspicious for lymphangitic carcinomatosis. Small left pleural effusion. Paramediastinal reticulation may represent posttreatment changes. Upper Abdomen: Stable gastrohepatic lymphadenopathy measuring 16 mm short axis (series 7, image 237). Musculoskeletal: No acute osseous abnormality is identified. Review of the MIP images confirms the above findings. IMPRESSION: 1. Positive for pulmonary embolus of a segmental left lower lobe pulmonary artery. No evidence for right heart strain. 2. Increasing mediastinal and hilar lymphadenopathy with progressive stenosis of multiple pulmonary artery and bronchial lobar and segmental branch origins. 3. 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 4. Stable wall thickening of mid esophagus. 5. Paramediastinal reticulation of the lungs may represent radiation changes. 6. Stable gastrohepatic lymphadenopathy. These results were  called by telephone at the time of interpretation on 06/13/2016 at 8:54 pm to Dr. Zenovia Jarred , who verbally acknowledged these results. Electronically Signed   By: Kristine Garbe M.D.   On: 06/13/2016 20:58    Scheduled Meds: . azithromycin  500 mg Intravenous Q24H  . baclofen  10 mg Oral QHS  . enoxaparin (LOVENOX) injection  90 mg Subcutaneous Q12H  . famotidine (PEPCID) IV  20 mg Intravenous Q12H  . feeding supplement (ENSURE ENLIVE)  237 mL Oral BID BM  . pantoprazole  40 mg Oral Daily  . protein supplement shake  11 oz Oral BID BM  . sodium chloride flush  3 mL Intravenous Q12H  . sodium chloride flush  3 mL Intravenous Q12H   Continuous Infusions:   LOS: 1 day   Tehilla Coffel, Orpah Melter, MD [facility] Pager [phone]  If 7PM-7AM, please contact night-coverage [email or web address] Password [email or web address] 06/14/2016, 2:55 PM

## 2016-06-15 DIAGNOSIS — C7951 Secondary malignant neoplasm of bone: Secondary | ICD-10-CM

## 2016-06-15 LAB — CBC
HEMATOCRIT: 34.7 % — AB (ref 39.0–52.0)
HEMOGLOBIN: 11.7 g/dL — AB (ref 13.0–17.0)
MCH: 29.2 pg (ref 26.0–34.0)
MCHC: 33.7 g/dL (ref 30.0–36.0)
MCV: 86.5 fL (ref 78.0–100.0)
Platelets: 220 10*3/uL (ref 150–400)
RBC: 4.01 MIL/uL — ABNORMAL LOW (ref 4.22–5.81)
RDW: 12.8 % (ref 11.5–15.5)
WBC: 6.8 10*3/uL (ref 4.0–10.5)

## 2016-06-15 MED ORDER — RIVAROXABAN (XARELTO) VTE STARTER PACK (15 & 20 MG): ORAL_TABLET | ORAL | 0 refills | Status: DC

## 2016-06-15 MED ORDER — RIVAROXABAN 15 MG PO TABS
15.0000 mg | ORAL_TABLET | Freq: Two times a day (BID) | ORAL | Status: DC
  Administered 2016-06-15 (×2): 15 mg via ORAL
  Filled 2016-06-15 (×2): qty 1

## 2016-06-15 MED ORDER — AZITHROMYCIN 250 MG PO TABS: ORAL_TABLET | ORAL | 0 refills | Status: DC

## 2016-06-15 MED ORDER — HEPARIN SOD (PORK) LOCK FLUSH 100 UNIT/ML IV SOLN
500.0000 [IU] | INTRAVENOUS | Status: AC | PRN
  Administered 2016-06-15: 500 [IU]

## 2016-06-15 NOTE — Progress Notes (Signed)
Patient trying to eat lunch and having difficulty swallowing. Food "gets stuck and comes right back up". Even having trouble with water. Pt states this has been happening off and on for a week. MD notified and orders obtained.Eulas Post, RN

## 2016-06-15 NOTE — Progress Notes (Signed)
Patient has tolerated clear soup, water and po meds. Discharge instructions given to pt and wife. IV team to deaccess the port-a-cath. Eulas Post, RN

## 2016-06-15 NOTE — Discharge Instructions (Signed)
Information on my medicine - XARELTO (rivaroxaban)  This medication education was reviewed with me or my healthcare representative as part of my discharge preparation.  The pharmacist that spoke with me during my hospital stay was:  Henreitta Leber. PharmD WHY WAS Luty? Xarelto was prescribed to treat blood clots that may have been found in the veins of your legs (deep vein thrombosis) or in your lungs (pulmonary embolism) and to reduce the risk of them occurring again.  What do you need to know about Xarelto? The starting dose is one 15 mg tablet taken TWICE daily with food for the FIRST 21 DAYS then on March 10th the dose is changed to one 20 mg tablet taken ONCE A DAY with your evening meal.  DO NOT stop taking Xarelto without talking to the health care provider who prescribed the medication.  Refill your prescription for 20 mg tablets before you run out.  After discharge, you should have regular check-up appointments with your healthcare provider that is prescribing your Xarelto.  In the future your dose may need to be changed if your kidney function changes by a significant amount.  What do you do if you miss a dose? If you are taking Xarelto TWICE DAILY and you miss a dose, take it as soon as you remember. You may take two 15 mg tablets (total 30 mg) at the same time then resume your regularly scheduled 15 mg twice daily the next day.  If you are taking Xarelto ONCE DAILY and you miss a dose, take it as soon as you remember on the same day then continue your regularly scheduled once daily regimen the next day. Do not take two doses of Xarelto at the same time.   Important Safety Information Xarelto is a blood thinner medicine that can cause bleeding. You should call your healthcare provider right away if you experience any of the following: ? Bleeding from an injury or your nose that does not stop. ? Unusual colored urine (red or dark brown) or unusual colored  stools (red or black). ? Unusual bruising for unknown reasons. ? A serious fall or if you hit your head (even if there is no bleeding).  Some medicines may interact with Xarelto and might increase your risk of bleeding while on Xarelto. To help avoid this, consult your healthcare provider or pharmacist prior to using any new prescription or non-prescription medications, including herbals, vitamins, non-steroidal anti-inflammatory drugs (NSAIDs) and supplements.  This website has more information on Xarelto: [email or web address]

## 2016-06-15 NOTE — Progress Notes (Signed)
ANTICOAGULATION CONSULT NOTE  Pharmacy Consult for Lovenox to rivaroxaban Indication: pulmonary embolus  No Known Allergies  Patient Measurements: Height: '5\' 11"'$  (180.3 cm) Weight: 190 lb 14.7 oz (86.6 kg) IBW/kg (Calculated) : 75.3  Vital Signs: Temp: 99.2 F (37.3 C) (02/17 0900) Temp Source: Oral (02/17 0900) BP: 127/86 (02/17 0900) Pulse Rate: 107 (02/17 0900)  Labs:  Recent Labs  06/13/16 1916 06/13/16 2150 06/14/16 0128 06/14/16 0339 06/14/16 0845 06/14/16 1437 06/15/16 0454  HGB 13.3  --   --  11.6*  --   --  11.7*  HCT 37.8*  --   --  33.4*  --   --  34.7*  PLT 227  --   --  194  --   --  220  APTT  --  124*  --   --   --   --   --   HEPARINUNFRC  --   --   --  0.87*  --   --   --   CREATININE 1.10  --   --  0.80  --   --   --   TROPONINI  --   --  <0.03  --  <0.03 <0.03  --     Estimated Creatinine Clearance: 102 mL/min (by C-G formula based on SCr of 0.8 mg/dL).   Medical History: Past Medical History:  Diagnosis Date  . Chronic back pain   . Complication of anesthesia    WOKE UP DURING COLONOSCOPY  . Esophageal cancer ([place]) 03/29/14  . GERD (gastroesophageal reflux disease)   . Pneumonia    May 27, 2016, was + for flu also  . Primary cancer of esophagus with metastasis to other site [facility]) 07/04/2015    Assessment: 37 yoM with metastatic esophageal cancer, s/p chemotherapy treatments,  was scheduled to begin new regimen (FOLFOX) this morning. He underwent placement of a Port-A-Cath 06/11/2016 but was feeling weak with chest pain 06/13/16. CTA chest Positive for pulmonary embolus of a segmental left lower lobe pulmonary artery. No evidence for right heart strain.   Heparin was started on the evening of 06/13/16.  On the morning of 06/14/16 orders were received to transition heparin to Lovenox. Dr Gearldine Shown note 2/16 suggested home on rivaroxaban, orders to change from [place] entered by  [facility].  Today, 06/15/2016:  Renal: SCr WNL   CBC: Hg low/stable,  pltc WNL  No major drug interactions  Last dose of enoxaparin 2200 2/16  Goal of Therapy:  Monitor platelets by anticoagulation protocol: Yes   Plan:   Start xarelto '15mg'$  PO BIDWC x 21 days (give 1st dose now) then '20mg'$  daily with supper  Monitor renal function and CBC  Monitor for bleeding  Pharmacist to provide education and discount card prior to discharge  Doreene Eland, PharmD, BCPS.   Pager: [phone] 06/15/2016 10:27 AM

## 2016-06-15 NOTE — Discharge Summary (Addendum)
Physician Discharge Summary  Billy Romero ZOX:[id number] DOB: [birth year] DOA: 06/13/2016  PCP: Lujean Amel, MD  Admit date: 06/13/2016 Discharge date: 06/15/2016  Admitted From: Home Disposition:  Home  Recommendations for Outpatient Follow-up:  1. Follow up with PCP in 2-3 weeks 2. Follow up with Dr. Benay Spice as scheduled 3. Consider outpatient GI referral if dysphagia persists  Discharge Condition:Stable CODE STATUS:Full Diet recommendation: Regular as tolerated   Brief/Interim Summary: 61 y.o.malewith medical history significant foresophageal cancer with metastases, presenting to the emergency department at the direction of his oncologist for evaluation of fevers, weakness, tachycardia, and pleuritic pain. Patient reports that he was diagnosed with influenza and pneumonia approximately 2 weeks ago and has completed treatment with Tamiflu and Levaquin, but has not experienced any significant improvement. In fact, he reports worsening cough productive of brownish sputum and the new development of left lateral chest wall pain with deep inspiration or cough. He denies any lower extremity swelling or tenderness, denies orthopnea or PND, and denies any personal or family history of VTE. Patient had a Port-A-Cath placed on 06/11/2016 and was scheduled to begin treatment with FOLFOX today. He was noted to be acutely ill-appearing at the cancer center with heart rate in the 130s. His oncologist was very concerned for PE and the patient was sent to the emergency department for evaluation of this.  1. Acute pulmonary embolism  - Pt presents with worsening cough and pleuritic pain despite completing treatment for PNA  - He was seen at the [facility], noted to be tachycardic to the 130's, and directed to ED for CTA chest  - CTA is positive for segmental PE in LLL without evidence for right heart-strain  - Patient was continued on therapeutic lovenox  - LE dopplers are neg for DVT - 2d echo  without RH strain - Oncology has recommended transition to xarelto  2. 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 GERD - Pt reports poorly-controlled sxs for the past week despite daily Nexium at home - Continue daily PPI; Pepcid BID was added  5. Dysphagia - Patient noted to have dysphagia with regular diet - Prior records reviewed. Patient noted to have same symptoms one month ago, prompting barium swallow with findings of moderate stricture at the patient's mid-esophageal tumor -Have recommended repeat barium swallow and possible GI consultation. - Patient is adamant about going home. Will update Dr. Benay Spice. Consider outpatient work up.  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   diclofenac sodium 1 % Gel Commonly known as:  VOLTAREN Apply  4 g topically 4 (four) times daily as needed (pain).   esomeprazole 20 MG capsule Commonly known as:  NEXIUM Take 20 mg by mouth daily at 12 noon.   HYDROcodone-acetaminophen 5-325 MG tablet Commonly known as:  NORCO/VICODIN Take 1 tablet by mouth 3 (three) times daily as needed for moderate pain. for pain   Rivaroxaban 15 & 20 MG Tbpk Take as directed on package: Start with one 51m tablet by mouth twice a day with food. On Day 22, switch to one 285mtablet once a day with food.      Follow-up Information    KOIRALA,DIBAS, MD. Schedule an appointment as soon as possible for a visit in 2 week(s).   Specialty:  Family Medicine Contact information: [phone]        SHBetsy CoderMD Follow up on 06/18/2016.   Specialty:  Oncology Why:  as scheduled Contact information: [phone]        No Known Allergies  Consultations:  Oncology  Procedures/Studies: Dg Chest 2 View  Result Date: 06/13/2016 CLINICAL DATA:  Persistent cough for 2 weeks and weakness. Pleuritic left chest pain. History esophageal cancer. EXAM: CHEST  2 VIEW COMPARISON:  05/27/2016 FINDINGS: A right jugular Port-A-Cath has been placed and terminates over the lower SVC. The cardiomediastinal silhouette is within normal limits. There is mild patchy and nodular opacity in the left lung base which is new or increased from the prior study. The right lung is clear. No pleural effusion or pneumothorax is identified. Thoracic spondylosis is noted. IMPRESSION: New mild left basilar opacity which may reflect atelectasis or early pneumonia. Follow-up radiographs are recommended to ensure resolution given the mildly nodular appearance and history of esophageal cancer. Electronically Signed   By: AlLogan Bores.D.   On: 06/13/2016 17:56   Dg Chest 2 View  Result Date: 05/27/2016 CLINICAL DATA:  Cough and fever  EXAM: CHEST  2 VIEW COMPARISON:  PA and lateral chest x-ray of July 04, 2015 FINDINGS: The lungs are adequately inflated. Subtle increased lung markings are noted in the left lower lobe which is accentuated by overlapping osteophyte in the lower thoracic spine. The right lung is clear. The heart and pulmonary vascularity are normal. The mediastinum is normal in width. There is multilevel degenerative disc disease of the thoracic spine with calcification in the a jejunal longitudinal ligament. IMPRESSION: Hazy increased density in the left lower lobe may reflect subsegmental atelectasis or early pneumonia. Followup PA and lateral chest X-ray is recommended in 3-4 weeks following trial of antibiotic therapy to ensure resolution. Electronically Signed   By: David  JoMartinique.D.   On: 05/27/2016 11:20   Ct Angio Chest Pe W And/or Wo Contrast  Result Date: 06/13/2016 CLINICAL DATA:  [age]/o M; generalized weakness, tachycardia, left-sided chest pain. Esophageal cancer post radiation 01/18. Recent port placement. Treated for fluid pneumonia 1 week ago. EXAM: CT ANGIOGRAPHY CHEST WITH CONTRAST TECHNIQUE: Multidetector CT imaging of the chest was performed using the standard protocol during bolus administration of intravenous contrast. Multiplanar CT image reconstructions and MIPs were obtained to evaluate the vascular anatomy. CONTRAST:  100 cc Isovue 370 COMPARISON:  06/13/2016 chest radiograph.  04/25/2016  chest CT. FINDINGS: Cardiovascular: Satisfactory opacification of pulmonary arteries. Segmental pulmonary embolus within the left lower lobe basal lateral segmental artery (series 7, image 142). RV/ LV index = 0.8. Normal size main pulmonary artery and thoracic aorta. Normal heart size. Small pericardial effusion. Mediastinum/Nodes: Mediastinal lymphadenopathy is stable or increased in size in comparison with the prior CT of the chest for example an aortopulmonary lymph node measures 19 mm short axis, previously 17 mm  (series 7, image 102) ; a left hilar node measures 15 mm, previously 13 mm (series 7, image 124). Additionally, there is progressive narrowing of multiple pulmonary artery is and pulmonary veins likely due to tumor infiltration for example in the left upper lobe lingular pulmonary artery origin (series 7, image 110). Stable thickening of the wall of the mid esophagus. Lungs/Pleura: There is stenosis or occlusion of multiple lobar and segmental bronchi due to mass effect from hilar adenopathy which has progressed from the prior CT. Left upper lobe pulmonary nodules are increased in size for example along the left major fissure measuring 13 mm, previously 9 mm (series 8, image 47). There has been interval development of nodular opacities within the hilar region of the right lower lobe with septal thickening and peribronchial thickening suspicious for lymphangitic carcinomatosis. Small left pleural effusion. Paramediastinal reticulation may represent posttreatment changes. Upper Abdomen: Stable gastrohepatic lymphadenopathy measuring 16 mm short axis (series 7, image 237). Musculoskeletal: No acute osseous abnormality is identified. Review of the MIP images confirms the above findings. IMPRESSION: 1. Positive for pulmonary embolus of a segmental left lower lobe pulmonary artery. No evidence for right heart strain. 2. Increasing mediastinal and hilar lymphadenopathy with progressive stenosis of multiple pulmonary artery and bronchial lobar and segmental branch origins. 3. 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 4. Stable wall thickening of mid esophagus. 5. Paramediastinal reticulation of the lungs may represent radiation changes. 6. Stable gastrohepatic lymphadenopathy. These results were called by telephone at the time of  interpretation on 06/13/2016 at 8:54 pm to Dr. Zenovia Jarred , who verbally acknowledged these results. Electronically Signed   By: Kristine Garbe M.D.   On: 06/13/2016 20:58   Ir US Guide Vasc Access Right  Result Date: 06/11/2016 INDICATION: History of esophageal cancer. In need of durable intravenous access for chemotherapy administration. EXAM: IMPLANTED PORT A CATH PLACEMENT WITH ULTRASOUND AND FLUOROSCOPIC GUIDANCE COMPARISON:  None. MEDICATIONS: Ancef 2 gm IV; The antibiotic was administered within an appropriate time interval prior to skin puncture. ANESTHESIA/SEDATION: Moderate (conscious) sedation was employed during this procedure. A total of Versed 2.5 mg and Fentanyl 125 mcg was administered intravenously. Moderate Sedation Time: 28 minutes. The patient's level of consciousness and vital signs were monitored continuously by radiology nursing throughout the procedure under my direct supervision. CONTRAST:  None FLUOROSCOPY TIME:  36 seconds (8 mGy) COMPLICATIONS: None immediate. PROCEDURE: The procedure, risks, benefits, and alternatives were explained to the patient. Questions regarding the procedure were encouraged and answered. The patient understands and consents to the procedure. The right neck and chest were prepped with chlorhexidine in a sterile fashion, and a sterile drape was applied covering the operative field. Maximum barrier sterile technique with sterile gowns and gloves were used for the procedure. A timeout was performed prior to the initiation of the procedure. Local anesthesia was provided with 1% lidocaine with epinephrine. After creating a small venotomy incision, a  micropuncture kit was utilized to access the internal jugular vein under direct, real-time ultrasound guidance. Ultrasound image documentation was performed. The microwire was kinked to measure appropriate catheter length. A subcutaneous port pocket was then created along the upper chest wall utilizing a  combination of sharp and blunt dissection. The pocket was irrigated with sterile saline. A single lumen thin power injectable port was chosen for placement. The 8 Fr catheter was tunneled from the port pocket site to the venotomy incision. The port was placed in the pocket. The external catheter was trimmed to appropriate length. At the venotomy, an 8 Fr peel-away sheath was placed over a guidewire under fluoroscopic guidance. The catheter was then placed through the sheath and the sheath was removed. Final catheter positioning was confirmed and documented with a fluoroscopic spot radiograph. The port was accessed with a Huber needle, aspirated and flushed with heparinized saline. The venotomy site was closed with an interrupted 4-0 Vicryl suture. The port pocket incision was closed with interrupted 2-0 Vicryl suture and the skin was opposed with a running subcuticular 4-0 Vicryl suture. Dermabond and Steri-strips were applied to both incisions. Dressings were placed. The patient tolerated the procedure well without immediate post procedural complication. FINDINGS: After catheter placement, the tip lies within the superior cavoatrial junction. The catheter aspirates and flushes normally and is ready for immediate use. IMPRESSION: Successful placement of a right internal jugular approach power injectable Port-A-Cath. The catheter is ready for immediate use. Electronically Signed   By: Sandi Mariscal M.D.   On: 06/11/2016 15:42   Ir Fluoro Guide Port Insertion Right  Result Date: 06/11/2016 INDICATION: History of esophageal cancer. In need of durable intravenous access for chemotherapy administration. EXAM: IMPLANTED PORT A CATH PLACEMENT WITH ULTRASOUND AND FLUOROSCOPIC GUIDANCE COMPARISON:  None. MEDICATIONS: Ancef 2 gm IV; The antibiotic was administered within an appropriate time interval prior to skin puncture. ANESTHESIA/SEDATION: Moderate (conscious) sedation was employed during this procedure. A total of  Versed 2.5 mg and Fentanyl 125 mcg was administered intravenously. Moderate Sedation Time: 28 minutes. The patient's level of consciousness and vital signs were monitored continuously by radiology nursing throughout the procedure under my direct supervision. CONTRAST:  None FLUOROSCOPY TIME:  36 seconds (8 mGy) COMPLICATIONS: None immediate. PROCEDURE: The procedure, risks, benefits, and alternatives were explained to the patient. Questions regarding the procedure were encouraged and answered. The patient understands and consents to the procedure. The right neck and chest were prepped with chlorhexidine in a sterile fashion, and a sterile drape was applied covering the operative field. Maximum barrier sterile technique with sterile gowns and gloves were used for the procedure. A timeout was performed prior to the initiation of the procedure. Local anesthesia was provided with 1% lidocaine with epinephrine. After creating a small venotomy incision, a micropuncture kit was utilized to access the internal jugular vein under direct, real-time ultrasound guidance. Ultrasound image documentation was performed. The microwire was kinked to measure appropriate catheter length. A subcutaneous port pocket was then created along the upper chest wall utilizing a combination of sharp and blunt dissection. The pocket was irrigated with sterile saline. A single lumen thin power injectable port was chosen for placement. The 8 Fr catheter was tunneled from the port pocket site to the venotomy incision. The port was placed in the pocket. The external catheter was trimmed to appropriate length. At the venotomy, an 8 Fr peel-away sheath was placed over a guidewire under fluoroscopic guidance. The catheter was then placed through the sheath and  the sheath was removed. Final catheter positioning was confirmed and documented with a fluoroscopic spot radiograph. The port was accessed with a Huber needle, aspirated and flushed with  heparinized saline. The venotomy site was closed with an interrupted 4-0 Vicryl suture. The port pocket incision was closed with interrupted 2-0 Vicryl suture and the skin was opposed with a running subcuticular 4-0 Vicryl suture. Dermabond and Steri-strips were applied to both incisions. Dressings were placed. The patient tolerated the procedure well without immediate post procedural complication. FINDINGS: After catheter placement, the tip lies within the superior cavoatrial junction. The catheter aspirates and flushes normally and is ready for immediate use. IMPRESSION: Successful placement of a right internal jugular approach power injectable Port-A-Cath. The catheter is ready for immediate use. Electronically Signed   By: Sandi Mariscal M.D.   On: 06/11/2016 15:42    Subjective: Eager to go home  Discharge Exam: Vitals:   06/15/16 0900 06/15/16 1400  BP: 127/86 116/73  Pulse: (!) 107 94  Resp: 20 18  Temp: 99.2 F (37.3 C) 98.9 F (37.2 C)   Vitals:   06/14/16 2157 06/15/16 0606 06/15/16 0900 06/15/16 1400  BP: 126/77 118/75 127/86 116/73  Pulse: (!) 114 93 (!) 107 94  Resp: 20 20 20 18   Temp: 100.1 F (37.8 C) 99.5 F (37.5 C) 99.2 F (37.3 C) 98.9 F (37.2 C)  TempSrc: Oral Oral Oral Oral  SpO2: 98% 95% 97%   Weight:      Height:        General: Pt is alert, awake, not in acute distress Cardiovascular: RRR, S1/S2 +, no rubs, no gallops Respiratory: CTA bilaterally, no wheezing, no rhonchi Abdominal: Soft, NT, ND, bowel sounds + Extremities: no edema, no cyanosis   The results of significant diagnostics from this hospitalization (including imaging, microbiology, ancillary and laboratory) are listed below for reference.     Microbiology: Recent Results (from the past 240 hour(s))  Blood culture (routine x 2)     Status: None (Preliminary result)   Collection Time: 06/13/16  7:16 PM  Result Value Ref Range Status   Specimen Description BLOOD RIGHT ANTECUBITAL  Final    Special Requests BOTTLES DRAWN AEROBIC AND ANAEROBIC 5CC  Final   Culture   Final    NO GROWTH 2 DAYS Performed at [facility], [address] [place], [place] [address]    Report Status PENDING  Incomplete  Blood culture (routine x 2)     Status: None (Preliminary result)   Collection Time: 06/13/16  8:08 PM  Result Value Ref Range Status   Specimen Description BLOOD LEFT ANTECUBITAL  Final   Special Requests BOTTLES DRAWN AEROBIC AND ANAEROBIC 5CC  Final   Culture   Final    NO GROWTH 2 DAYS Performed at [facility], [place] [address] [place], [place] [address]    Report Status PENDING  Incomplete  Culture, sputum-assessment     Status: None   Collection Time: 06/14/16  1:44 AM  Result Value Ref Range Status   Specimen Description SPUTUM  Final   Special Requests NONE  Final   Sputum evaluation THIS SPECIMEN IS ACCEPTABLE FOR SPUTUM CULTURE  Final   Report Status 06/14/2016 FINAL  Final  Culture, respiratory (NON-Expectorated)     Status: None (Preliminary result)   Collection Time: 06/14/16  1:44 AM  Result Value Ref Range Status   Specimen Description SPUTUM  Final   Special Requests NONE Reflexed from [id number]  Final  Gram Stain   Final    MODERATE SQUAMOUS EPITHELIAL CELLS PRESENT RARE WBC PRESENT, PREDOMINANTLY PMN MODERATE GRAM POSITIVE COCCI IN CHAINS MODERATE GRAM POSITIVE COCCI IN PAIRS MODERATE GRAM NEGATIVE RODS MODERATE GRAM POSITIVE RODS    Culture   Final    CULTURE REINCUBATED FOR BETTER GROWTH Performed at [facility], [place] [address] [place], [place] [address]    Report Status PENDING  Incomplete     Labs: BNP (last 3 results) No results for input(s): BNP in the last 8760 hours. Basic Metabolic Panel:  Recent Labs Lab 06/13/16 1916 06/14/16 0339  NA 136 136  K 3.9 3.6  CL 98* 105  CO2 28 25  GLUCOSE 116* 112*  BUN 10 9  CREATININE 1.10 0.80  CALCIUM 9.6 8.9   Liver Function Tests:  Recent Labs Lab 06/13/16 1916   AST 18  ALT 14*  ALKPHOS 108  BILITOT 1.4*  PROT 7.0  ALBUMIN 3.2*   No results for input(s): LIPASE, AMYLASE in the last 168 hours. No results for input(s): AMMONIA in the last 168 hours. CBC:  Recent Labs Lab 06/11/16 1152 06/13/16 1916 06/14/16 0339 06/15/16 0454  WBC 8.7 9.6 7.3 6.8  NEUTROABS 7.0 7.7  --   --   HGB 14.0 13.3 11.6* 11.7*  HCT 40.1 37.8* 33.4* 34.7*  MCV 85.9 84.8 86.3 86.5  PLT 265 227 194 220   Cardiac Enzymes:  Recent Labs Lab 06/14/16 0128 06/14/16 0845 06/14/16 1437  TROPONINI <0.03 <0.03 <0.03   BNP: Invalid input(s): POCBNP CBG:  Recent Labs Lab 06/14/16 0724  GLUCAP 107*   D-Dimer No results for input(s): DDIMER in the last 72 hours. Hgb A1c No results for input(s): HGBA1C in the last 72 hours. Lipid Profile No results for input(s): CHOL, HDL, LDLCALC, TRIG, CHOLHDL, LDLDIRECT in the last 72 hours. Thyroid function studies No results for input(s): TSH, T4TOTAL, T3FREE, THYROIDAB in the last 72 hours.  Invalid input(s): FREET3 Anemia work up No results for input(s): VITAMINB12, FOLATE, FERRITIN, TIBC, IRON, RETICCTPCT in the last 72 hours. Urinalysis    Component Value Date/Time   COLORURINE YELLOW 04/30/2016 1155   APPEARANCEUR CLEAR 04/30/2016 1155   LABSPEC 1.017 04/30/2016 1155   PHURINE 7.0 04/30/2016 1155   GLUCOSEU NEGATIVE 04/30/2016 1155   HGBUR NEGATIVE 04/30/2016 [place] 04/30/2016 1155   KETONESUR NEGATIVE 04/30/2016 1155   PROTEINUR NEGATIVE 04/30/2016 1155   UROBILINOGEN 1.0 04/11/2014 1830   NITRITE NEGATIVE 04/30/2016 1155   LEUKOCYTESUR NEGATIVE 04/30/2016 1155   Sepsis Labs Invalid input(s): PROCALCITONIN,  WBC,  LACTICIDVEN Microbiology Recent Results (from the past 240 hour(s))  Blood culture (routine x 2)     Status: None (Preliminary result)   Collection Time: 06/13/16  7:16 PM  Result Value Ref Range Status   Specimen Description BLOOD RIGHT ANTECUBITAL  Final    Special Requests BOTTLES DRAWN AEROBIC AND ANAEROBIC 5CC  Final   Culture   Final    NO GROWTH 2 DAYS Performed at [facility], [place] [address], [place], [place] [address]    Report Status PENDING  Incomplete  Blood culture (routine x 2)     Status: None (Preliminary result)   Collection Time: 06/13/16  8:08 PM  Result Value Ref Range Status   Specimen Description BLOOD LEFT ANTECUBITAL  Final   Special Requests BOTTLES DRAWN AEROBIC AND ANAEROBIC 5CC  Final   Culture   Final    NO GROWTH 2  DAYS Performed at [facility], [place] [address] [place], [place] [address]    Report Status PENDING  Incomplete  Culture, sputum-assessment     Status: None   Collection Time: 06/14/16  1:44 AM  Result Value Ref Range Status   Specimen Description SPUTUM  Final   Special Requests NONE  Final   Sputum evaluation THIS SPECIMEN IS ACCEPTABLE FOR SPUTUM CULTURE  Final   Report Status 06/14/2016 FINAL  Final  Culture, respiratory (NON-Expectorated)     Status: None (Preliminary result)   Collection Time: 06/14/16  1:44 AM  Result Value Ref Range Status   Specimen Description SPUTUM  Final   Special Requests NONE Reflexed from [id number]  Final   Gram Stain   Final    MODERATE SQUAMOUS EPITHELIAL CELLS PRESENT RARE WBC PRESENT, PREDOMINANTLY PMN MODERATE GRAM POSITIVE COCCI IN CHAINS MODERATE GRAM POSITIVE COCCI IN PAIRS MODERATE GRAM NEGATIVE RODS MODERATE GRAM POSITIVE RODS    Culture   Final    CULTURE REINCUBATED FOR BETTER GROWTH Performed at [facility], [place] [address] [place], [place] [address]    Report Status PENDING  Incomplete     SIGNED:   Rasaan Brotherton, Orpah Melter, MD  [facility] 06/15/2016, 5:15 PM  If 7PM-7AM, please contact night-coverage [email or web address] Password [email or web address]

## 2016-06-16 LAB — CULTURE, RESPIRATORY W GRAM STAIN: Culture: NORMAL

## 2016-06-16 LAB — CULTURE, RESPIRATORY

## 2016-06-17 LAB — GLUCOSE, CAPILLARY: GLUCOSE-CAPILLARY: 97 mg/dL (ref 65–99)

## 2016-06-18 ENCOUNTER — Telehealth: Payer: Self-pay | Admitting: Nurse Practitioner

## 2016-06-18 ENCOUNTER — Other Ambulatory Visit (HOSPITAL_BASED_OUTPATIENT_CLINIC_OR_DEPARTMENT_OTHER): Payer: BLUE CROSS/BLUE SHIELD

## 2016-06-18 ENCOUNTER — Ambulatory Visit (HOSPITAL_BASED_OUTPATIENT_CLINIC_OR_DEPARTMENT_OTHER): Payer: BLUE CROSS/BLUE SHIELD | Admitting: Nurse Practitioner

## 2016-06-18 ENCOUNTER — Ambulatory Visit (HOSPITAL_BASED_OUTPATIENT_CLINIC_OR_DEPARTMENT_OTHER): Payer: BLUE CROSS/BLUE SHIELD

## 2016-06-18 ENCOUNTER — Ambulatory Visit: Payer: BLUE CROSS/BLUE SHIELD

## 2016-06-18 VITALS — BP 84/58 | HR 130 | Temp 98.3°F | Resp 18 | Ht 71.0 in | Wt 186.2 lb

## 2016-06-18 VITALS — BP 80/55 | HR 112

## 2016-06-18 DIAGNOSIS — C154 Malignant neoplasm of middle third of esophagus: Secondary | ICD-10-CM

## 2016-06-18 DIAGNOSIS — R1310 Dysphagia, unspecified: Secondary | ICD-10-CM

## 2016-06-18 DIAGNOSIS — C7801 Secondary malignant neoplasm of right lung: Secondary | ICD-10-CM | POA: Diagnosis not present

## 2016-06-18 DIAGNOSIS — Z95828 Presence of other vascular implants and grafts: Secondary | ICD-10-CM

## 2016-06-18 DIAGNOSIS — I2699 Other pulmonary embolism without acute cor pulmonale: Secondary | ICD-10-CM

## 2016-06-18 DIAGNOSIS — C771 Secondary and unspecified malignant neoplasm of intrathoracic lymph nodes: Secondary | ICD-10-CM

## 2016-06-18 DIAGNOSIS — H532 Diplopia: Secondary | ICD-10-CM

## 2016-06-18 DIAGNOSIS — R05 Cough: Secondary | ICD-10-CM

## 2016-06-18 LAB — CULTURE, BLOOD (ROUTINE X 2)
Culture: NO GROWTH
Culture: NO GROWTH

## 2016-06-18 LAB — CBC WITH DIFFERENTIAL/PLATELET
BASO%: 0.5 % (ref 0.0–2.0)
BASOS ABS: 0 10*3/uL (ref 0.0–0.1)
EOS ABS: 0.1 10*3/uL (ref 0.0–0.5)
EOS%: 1 % (ref 0.0–7.0)
HEMATOCRIT: 38.7 % (ref 38.4–49.9)
HGB: 13.4 g/dL (ref 13.0–17.1)
LYMPH%: 16.1 % (ref 14.0–49.0)
MCH: 30.3 pg (ref 27.2–33.4)
MCHC: 34.5 g/dL (ref 32.0–36.0)
MCV: 87.8 fL (ref 79.3–98.0)
MONO#: 0.6 10*3/uL (ref 0.1–0.9)
MONO%: 9.1 % (ref 0.0–14.0)
NEUT#: 4.6 10*3/uL (ref 1.5–6.5)
NEUT%: 73.3 % (ref 39.0–75.0)
Platelets: 292 10*3/uL (ref 140–400)
RBC: 4.41 10*6/uL (ref 4.20–5.82)
RDW: 13 % (ref 11.0–14.6)
WBC: 6.3 10*3/uL (ref 4.0–10.3)
lymph#: 1 10*3/uL (ref 0.9–3.3)

## 2016-06-18 LAB — COMPREHENSIVE METABOLIC PANEL
ALBUMIN: 2.8 g/dL — AB (ref 3.5–5.0)
ALK PHOS: 138 U/L (ref 40–150)
ALT: 38 U/L (ref 0–55)
AST: 43 U/L — AB (ref 5–34)
Anion Gap: 11 mEq/L (ref 3–11)
BUN: 9.9 mg/dL (ref 7.0–26.0)
CALCIUM: 10.6 mg/dL — AB (ref 8.4–10.4)
CHLORIDE: 103 meq/L (ref 98–109)
CO2: 26 mEq/L (ref 22–29)
Creatinine: 1 mg/dL (ref 0.7–1.3)
EGFR: 81 mL/min/{1.73_m2} — AB (ref >90)
Glucose: 124 mg/dl (ref 70–140)
POTASSIUM: 3.9 meq/L (ref 3.5–5.1)
Sodium: 140 mEq/L (ref 136–145)
Total Bilirubin: 0.48 mg/dL (ref 0.20–1.20)
Total Protein: 6.9 g/dL (ref 6.4–8.3)

## 2016-06-18 MED ORDER — SODIUM CHLORIDE 0.9% FLUSH
10.0000 mL | INTRAVENOUS | Status: AC | PRN
  Administered 2016-06-18: 10 mL
  Filled 2016-06-18: qty 10

## 2016-06-18 MED ORDER — HEPARIN SOD (PORK) LOCK FLUSH 100 UNIT/ML IV SOLN
500.0000 [IU] | INTRAVENOUS | Status: AC | PRN
  Administered 2016-06-18: 500 [IU]
  Filled 2016-06-18: qty 5

## 2016-06-18 MED ORDER — SODIUM CHLORIDE 0.9 % IV SOLN
1500.0000 mL | Freq: Once | INTRAVENOUS | Status: AC
  Administered 2016-06-18: 1500 mL via INTRAVENOUS

## 2016-06-18 MED ORDER — SODIUM CHLORIDE 0.9% FLUSH
10.0000 mL | INTRAVENOUS | Status: DC | PRN
  Administered 2016-06-18: 10 mL via INTRAVENOUS
  Filled 2016-06-18: qty 10

## 2016-06-18 NOTE — Telephone Encounter (Signed)
Appointments scheduled per 2/20 LOS. Patient given AVS report and calendars with future scheduled appointments. °

## 2016-06-18 NOTE — Progress Notes (Signed)
Dr. Benay Spice notified of orthostatic BP and HR. Okay to be discharged per MD. Encouraged pt to push fluids at home and increase nutrition as able. Pt verbalized understanding.

## 2016-06-18 NOTE — Progress Notes (Addendum)
[place] OFFICE PROGRESS NOTE   Diagnosis:  Esophagus cancer  INTERVAL HISTORY:   [name] returns as scheduled. He was hospitalized 06/13/2016 with cough/left-sided chest pain/dyspnea/tachycardia/low-grade fever. Pulmonary embolism was confirmed on chest CT. Progressive chest adenopathy, inflammatory changes versus lymphatic tumor spread in the right lower lobe also noted. He was discharged home on 06/15/2016 on Xarelto.  The shortness of breath and chest pain are better. He continues to have a cough. He denies any bleeding. No black stools. Double vision is better. He has "migraine" pain above the left eye. No nausea or vomiting. No diarrhea. On the day of discharge he developed dysphagia to solids. He is tolerating liquids without difficulty but estimates probable inadequate intake since discharge. Some dizziness with position change.  Objective:  Vital signs in last 24 hours:  Blood pressure 91/76, pulse (!) 130, temperature 98.3 F (36.8 C), temperature source Oral, resp. rate 18, height _0  (1.803 m), weight 186 lb 3.2 oz (84.5 kg), SpO2 99 %.    HEENT: No thrush or ulcers. Resp: Lungs clear bilaterally. Cardio: Regular, tachycardic. GI: Abdomen soft and nontender. No hepatomegaly. Vascular: No leg edema. Neuro: Alert and oriented. Extraocular movements intact.  Skin: Decreased skin turgor. Port-A-Cath without erythema.    Lab Results:  Lab Results  Component Value Date   WBC 6.3 06/18/2016   HGB 13.4 06/18/2016   HCT 38.7 06/18/2016   MCV 87.8 06/18/2016   PLT 292 06/18/2016   NEUTROABS 4.6 06/18/2016     Medications: I have reviewed the patient's current medications.  Assessment/Plan: 1. 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 Radiation completed 06/10/2014  PET scan 07/01/2014 revealed improvement in the hypermetabolic esophagus mass and mediastinal lymphadenopathy  upper endoscopy 04/20/2015 -mid esophagus with mild narrowing , 2 small nodules measuring approximate 5 mm -biopsy with scant and detached fragments with high-grade squamous dysplasia , brushings negative for malignant cells.  CT chest/abdomen/pelvis 05/25/2015 with development of bilateral pulmonary nodules; paratracheal and subcarinal nodes improved/similar; new small lower paraesophageal nodes which are indeterminate; esophageal wall thickening improved since the prior PET; no evidence of metastatic disease in the abdomen or pelvis.  PET scan 06/16/2015 revealed hypermetabolic lung nodules, hypermetabolic mediastinal nodes, hypermetabolism at the mid to distal esophagus has improved  bronchoscopy/EBUS on 07/04/2015 confirmed squamous cell carcinoma involving right lower lobe brushings, right bronchial washings, a level 10 R lymph node biopsy, and right lower lobe biopsy  PD-L1 no expression on the right lower lobe biopsy (less than 1%)  Cycle 1 Pembrolizumab 07/26/2015  Cycle 2 Pembrolizumab 08/16/2015  Cycle 3 Pembrolizumab 09/06/2015  Cycle 4 Pembrolizumab 09/27/2015  CT chest 10/11/2015-stable/decreased size of pulmonary nodules, enlargement of several mediastinal nodes  Cycle 5 Pembrolizumab 10/18/2015  Cycle 6 Pembrolizumab 11/08/2015   Cycle 7 Pembrolizumab08/05/2015  Cycle 8 Pembrolizumab08/23/2017  CT chest 01/15/2016-slight progression of mediastinal/upper abdominal lymphadenopathy and enlargement of a right lower lobe nodule  Cycle 9 Pembrolizumab 01/17/2016   Cycle 10 Pembrolizumab10/02/2016  Cycle 11 Pembrolizumab 02/28/2016  Cycle 12 Pembrolizumab 03/20/2016  CT chest 04/25/2016-minimal progression  of chest lymphadenopathy and lung nodules, thickening at the mid thoracic esophagus  MRI brain 04/30/2016-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 Solid/liquid dysphagia secondary to #1  Jejunostomy feeding tube placement 04/07/2014  Feeding tube removed 05/30/2014.  3. Small bowel obstruction following placement of the jejunostomy feeding tube-resolved  4. History of ulcerative colitis  5. Aspiration pneumonia December 2015  6. 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 Fever/cough/dyspnea 04/30/2016-nasal swab positive for influenza A , chest x-ray with a left lower lobe Pneumonia -treated with Tamiflu and Levaquin   8. Admission 06/13/2016 with a cough/left-sided chest pain/dyspnea/tachycardia/low-grade fever-pulmonary embolism confirmed on a chest CT, progressive chest adenopathy, inflammatory changes versus lymphatic tumor spread in the right lower lobe. Maintained on Xarelto.  9. Port-A-Cath placement 06/11/2016  10. Dysphagia. Barium swallow 05/01/2016 with circumferential narrowing of the midesophagus causing moderate degree of stricture which would not allow the 13 mm barium tablet to pass. The dysphagia improved spontaneously. He developed recurrent dysphagia to solids 06/15/2016. He is tolerating liquids.    Disposition: Billy Romero appears stable. He was scheduled to begin cycle 1 FOLFOX today. However, he appears to be dehydrated. We are holding chemotherapy today and rescheduling 06/20/2016. He will receive IV fluids followed by repeat orthostatic vital signs.   The  dehydration is likely related to the dysphagia. He is able to tolerate liquids and will increase his intake. We made a referral to the [facility] dietitian as well. As noted above he will receive IV fluids today.  He continues Xarelto for the recent pulmonary embolus.  He will return for cycle 1 FOLFOX 06/20/2016. He will return for a follow-up visit on 06/27/2016. He will contact the office in the interim with any problems.  Patient seen with Dr. Benay Spice. 25 minutes were spent face-to-face at today's visit with the majority of that time involved in counseling/coordination of care.    Ned Card ANP/GNP-BC   06/18/2016  11:18 AM This was a shared visit with Ned Card. Mr. Motyka is scheduled to begin chemotherapy today. He has developed recurrent dysphagia. He is tolerating liquids, but appears dehydrated today. He will receive intravenous fluids today and return on 06/20/2016 with the plan to begin FOLFOX chemotherapy. We recommended he increase his fluid intake and we referred him to the [facility] nutritionist. The symptoms from the pulmonary embolism appear improved.  Julieanne Manson, M.D.

## 2016-06-18 NOTE — Patient Instructions (Signed)
Dehydration, Adult Dehydration is a condition in which there is not enough fluid or water in the body. This happens when you lose more fluids than you take in. Important organs, such as the kidneys, brain, and heart, cannot function without a proper amount of fluids. Any loss of fluids from the body can lead to dehydration. Dehydration can range from mild to severe. This condition should be treated right away to prevent it from becoming severe. What are the causes? This condition may be caused by:  Vomiting.  Diarrhea.  Excessive sweating, such as from heat exposure or exercise.  Not drinking enough fluid, especially:  When ill.  While doing activity that requires a lot of energy.  Excessive urination.  Fever.  Infection.  Certain medicines, such as medicines that cause the body to lose excess fluid (diuretics).  Inability to access safe drinking water.  Reduced physical ability to get adequate water and food. What increases the risk? This condition is more likely to develop in people:  Who have a poorly controlled long-term (chronic) illness, such as diabetes, heart disease, or kidney disease.  Who are age 65 or older.  Who are disabled.  Who live in a place with high altitude.  Who play endurance sports. What are the signs or symptoms? Symptoms of mild dehydration may include:   Thirst.  Dry lips.  Slightly dry mouth.  Dry, warm skin.  Dizziness. Symptoms of moderate dehydration may include:   Very dry mouth.  Muscle cramps.  Dark urine. Urine may be the color of tea.  Decreased urine production.  Decreased tear production.  Heartbeat that is irregular or faster than normal (palpitations).  Headache.  Light-headedness, especially when you stand up from a sitting position.  Fainting (syncope). Symptoms of severe dehydration may include:   Changes in skin, such as:  Cold and clammy skin.  Blotchy (mottled) or pale skin.  Skin that does  not quickly return to normal after being lightly pinched and released (poor skin turgor).  Changes in body fluids, such as:  Extreme thirst.  No tear production.  Inability to sweat when body temperature is high, such as in hot weather.  Very little urine production.  Changes in vital signs, such as:  Weak pulse.  Pulse that is more than 100 beats a minute when sitting still.  Rapid breathing.  Low blood pressure.  Other changes, such as:  Sunken eyes.  Cold hands and feet.  Confusion.  Lack of energy (lethargy).  Difficulty waking up from sleep.  Short-term weight loss.  Unconsciousness. How is this diagnosed? This condition is diagnosed based on your symptoms and a physical exam. Blood and urine tests may be done to help confirm the diagnosis. How is this treated? Treatment for this condition depends on the severity. Mild or moderate dehydration can often be treated at home. Treatment should be started right away. Do not wait until dehydration becomes severe. Severe dehydration is an emergency and it needs to be treated in a hospital. Treatment for mild dehydration may include:   Drinking more fluids.  Replacing salts and minerals in your blood (electrolytes) that you may have lost. Treatment for moderate dehydration may include:   Drinking an oral rehydration solution (ORS). This is a drink that helps you replace fluids and electrolytes (rehydrate). It can be found at pharmacies and retail stores. Treatment for severe dehydration may include:   Receiving fluids through an IV tube.  Receiving an electrolyte solution through a feeding tube that is   passed through your nose and into your stomach (nasogastric tube, or NG tube).  Correcting any abnormalities in electrolytes.  Treating the underlying cause of dehydration. Follow these instructions at home:  If directed by your health care provider, drink an ORS:  Make an ORS by following instructions on the  package.  Start by drinking small amounts, about  cup (120 mL) every 5-10 minutes.  Slowly increase how much you drink until you have taken the amount recommended by your health care provider.  Drink enough clear fluid to keep your urine clear or pale yellow. If you were told to drink an ORS, finish the ORS first, then start slowly drinking other clear fluids. Drink fluids such as:  Water. Do not drink only water. Doing that can lead to having too little salt (sodium) in the body (hyponatremia).  Ice chips.  Fruit juice that you have added water to (diluted fruit juice).  Low-calorie sports drinks.  Avoid:  Alcohol.  Drinks that contain a lot of sugar. These include high-calorie sports drinks, fruit juice that is not diluted, and soda.  Caffeine.  Foods that are greasy or contain a lot of fat or sugar.  Take over-the-counter and prescription medicines only as told by your health care provider.  Do not take sodium tablets. This can lead to having too much sodium in the body (hypernatremia).  Eat foods that contain a healthy balance of electrolytes, such as bananas, oranges, potatoes, tomatoes, and spinach.  Keep all follow-up visits as told by your health care provider. This is important. Contact a health care provider if:  You have abdominal pain that:  Gets worse.  Stays in one area (localizes).  You have a rash.  You have a stiff neck.  You are more irritable than usual.  You are sleepier or more difficult to wake up than usual.  You feel weak or dizzy.  You feel very thirsty.  You have urinated only a small amount of very dark urine over 6-8 hours. Get help right away if:  You have symptoms of severe dehydration.  You cannot drink fluids without vomiting.  Your symptoms get worse with treatment.  You have a fever.  You have a severe headache.  You have vomiting or diarrhea that:  Gets worse.  Does not go away.  You have blood or green matter  (bile) in your vomit.  You have blood in your stool. This may cause stool to look black and tarry.  You have not urinated in 6-8 hours.  You faint.  Your heart rate while sitting still is over 100 beats a minute.  You have trouble breathing. This information is not intended to replace advice given to you by your health care provider. Make sure you discuss any questions you have with your health care provider. Document Released: 04/15/2005 Document Revised: 11/10/2015 Document Reviewed: 06/09/2015 Elsevier Interactive Patient Education  2017 [employer]  

## 2016-06-19 ENCOUNTER — Telehealth: Payer: Self-pay

## 2016-06-19 DIAGNOSIS — C154 Malignant neoplasm of middle third of esophagus: Secondary | ICD-10-CM

## 2016-06-19 NOTE — Telephone Encounter (Signed)
Called and informed pt, his chemo appt was r/s to 06/24/16 with pump d/c on 2/28. Pt verbalized understanding.

## 2016-06-24 ENCOUNTER — Other Ambulatory Visit: Payer: Self-pay | Admitting: *Deleted

## 2016-06-24 ENCOUNTER — Other Ambulatory Visit (HOSPITAL_BASED_OUTPATIENT_CLINIC_OR_DEPARTMENT_OTHER): Payer: BLUE CROSS/BLUE SHIELD

## 2016-06-24 ENCOUNTER — Ambulatory Visit (HOSPITAL_BASED_OUTPATIENT_CLINIC_OR_DEPARTMENT_OTHER): Payer: BLUE CROSS/BLUE SHIELD

## 2016-06-24 ENCOUNTER — Ambulatory Visit: Payer: BLUE CROSS/BLUE SHIELD | Admitting: Nutrition

## 2016-06-24 VITALS — BP 110/71 | HR 97 | Temp 98.1°F | Resp 18

## 2016-06-24 DIAGNOSIS — C154 Malignant neoplasm of middle third of esophagus: Secondary | ICD-10-CM

## 2016-06-24 DIAGNOSIS — Z5111 Encounter for antineoplastic chemotherapy: Secondary | ICD-10-CM | POA: Diagnosis not present

## 2016-06-24 LAB — COMPREHENSIVE METABOLIC PANEL
ALBUMIN: 3.2 g/dL — AB (ref 3.5–5.0)
ALK PHOS: 158 U/L — AB (ref 40–150)
ALT: 21 U/L (ref 0–55)
ANION GAP: 10 meq/L (ref 3–11)
AST: 23 U/L (ref 5–34)
BILIRUBIN TOTAL: 0.55 mg/dL (ref 0.20–1.20)
BUN: 9.8 mg/dL (ref 7.0–26.0)
CALCIUM: 11.8 mg/dL — AB (ref 8.4–10.4)
CO2: 28 mEq/L (ref 22–29)
Chloride: 99 mEq/L (ref 98–109)
Creatinine: 1.3 mg/dL (ref 0.7–1.3)
EGFR: 60 mL/min/{1.73_m2} — AB (ref >90)
Glucose: 122 mg/dl (ref 70–140)
POTASSIUM: 4.8 meq/L (ref 3.5–5.1)
Sodium: 137 mEq/L (ref 136–145)
TOTAL PROTEIN: 7.2 g/dL (ref 6.4–8.3)

## 2016-06-24 LAB — CBC WITH DIFFERENTIAL/PLATELET
BASO%: 0.2 % (ref 0.0–2.0)
BASOS ABS: 0 10*3/uL (ref 0.0–0.1)
EOS ABS: 0 10*3/uL (ref 0.0–0.5)
EOS%: 0.3 % (ref 0.0–7.0)
HEMATOCRIT: 41.2 % (ref 38.4–49.9)
HEMOGLOBIN: 14.3 g/dL (ref 13.0–17.1)
LYMPH#: 1.4 10*3/uL (ref 0.9–3.3)
LYMPH%: 16.1 % (ref 14.0–49.0)
MCH: 30.3 pg (ref 27.2–33.4)
MCHC: 34.7 g/dL (ref 32.0–36.0)
MCV: 87.3 fL (ref 79.3–98.0)
MONO#: 0.6 10*3/uL (ref 0.1–0.9)
MONO%: 6.7 % (ref 0.0–14.0)
NEUT#: 6.6 10*3/uL — ABNORMAL HIGH (ref 1.5–6.5)
NEUT%: 76.7 % — ABNORMAL HIGH (ref 39.0–75.0)
PLATELETS: 383 10*3/uL (ref 140–400)
RBC: 4.72 10*6/uL (ref 4.20–5.82)
RDW: 13.2 % (ref 11.0–14.6)
WBC: 8.7 10*3/uL (ref 4.0–10.3)

## 2016-06-24 MED ORDER — PROCHLORPERAZINE MALEATE 10 MG PO TABS: 10.0000 mg | ORAL_TABLET | Freq: Four times a day (QID) | ORAL | 0 refills | Status: DC | PRN

## 2016-06-24 MED ORDER — DEXAMETHASONE SODIUM PHOSPHATE 10 MG/ML IJ SOLN
10.0000 mg | Freq: Once | INTRAMUSCULAR | Status: AC
  Administered 2016-06-24: 10 mg via INTRAVENOUS

## 2016-06-24 MED ORDER — PALONOSETRON HCL INJECTION 0.25 MG/5ML
INTRAVENOUS | Status: AC
  Filled 2016-06-24: qty 5

## 2016-06-24 MED ORDER — DEXAMETHASONE SODIUM PHOSPHATE 10 MG/ML IJ SOLN
INTRAMUSCULAR | Status: AC
  Filled 2016-06-24: qty 1

## 2016-06-24 MED ORDER — FLUOROURACIL CHEMO INJECTION 2.5 GM/50ML
400.0000 mg/m2 | Freq: Once | INTRAVENOUS | Status: AC
  Administered 2016-06-24: 850 mg via INTRAVENOUS
  Filled 2016-06-24: qty 17

## 2016-06-24 MED ORDER — SODIUM CHLORIDE 0.9 % IV SOLN
2400.0000 mg/m2 | INTRAVENOUS | Status: DC
  Administered 2016-06-24: 5100 mg via INTRAVENOUS
  Filled 2016-06-24: qty 102

## 2016-06-24 MED ORDER — OXALIPLATIN CHEMO INJECTION 100 MG/20ML
85.0000 mg/m2 | Freq: Once | INTRAVENOUS | Status: AC
  Administered 2016-06-24: 180 mg via INTRAVENOUS
  Filled 2016-06-24: qty 36

## 2016-06-24 MED ORDER — SODIUM CHLORIDE 0.9% FLUSH
10.0000 mL | INTRAVENOUS | Status: DC | PRN
  Filled 2016-06-24: qty 10

## 2016-06-24 MED ORDER — LIDOCAINE-PRILOCAINE 2.5-2.5 % EX CREA: TOPICAL_CREAM | CUTANEOUS | 0 refills | Status: AC

## 2016-06-24 MED ORDER — HEPARIN SOD (PORK) LOCK FLUSH 100 UNIT/ML IV SOLN
500.0000 [IU] | Freq: Once | INTRAVENOUS | Status: DC | PRN
  Filled 2016-06-24: qty 5

## 2016-06-24 MED ORDER — LEUCOVORIN CALCIUM INJECTION 350 MG
400.0000 mg/m2 | Freq: Once | INTRAVENOUS | Status: AC
  Administered 2016-06-24: 848 mg via INTRAVENOUS
  Filled 2016-06-24: qty 42.4

## 2016-06-24 MED ORDER — PALONOSETRON HCL INJECTION 0.25 MG/5ML
0.2500 mg | Freq: Once | INTRAVENOUS | Status: AC
  Administered 2016-06-24: 0.25 mg via INTRAVENOUS

## 2016-06-24 MED ORDER — DEXTROSE 5 % IV SOLN
Freq: Once | INTRAVENOUS | Status: AC
  Administered 2016-06-24: 14:00:00 via INTRAVENOUS

## 2016-06-24 NOTE — Progress Notes (Signed)
Patient is familiar from previous treatment for obstructive esophageal cancer.  Patient is currently having chemotherapy. He has recently had both a PE and the flu. He is now having both solid and liquid dysphagia.  He has had problems with dehydration. He has esophageal stricture. Current weight is 186.2 pounds which is decreased from 203 pounds on Dec 13. This is an 8% weight loss over 2 months. Patient tolerates oral nutrition supplements.  Nutrition Diagnosis: Unintended weight loss continues.  Intervention: Patient educated to increase soft moist foods and oral nutrition supplements to maintain weight.  Provided fact sheet on soft, moist foods. Encouraged increased oral nutrition supplements when tolerated. Brief education provided on oral intake with oxaliplatin. Questions answered and teach back method used.  Monitoring, Evaluation, Goals: Patient will increase oral intake for minimal weight loss.  Next Visit:To be scheduled with chemotherapy.  **Disclaimer: This note was dictated with voice recognition software. Similar sounding words can inadvertently be transcribed and this note may contain transcription errors which may not have been corrected upon publication of note.**

## 2016-06-24 NOTE — Patient Instructions (Signed)
[facility] Discharge Instructions for Patients Receiving Chemotherapy  Today you received the following chemotherapy agents: Oxaliplatin, Leucovorin and Adrucil   To help prevent nausea and vomiting after your treatment, we encourage you to take your nausea medication as directed.    If you develop nausea and vomiting that is not controlled by your nausea medication, call the clinic.   BELOW ARE SYMPTOMS THAT SHOULD BE REPORTED IMMEDIATELY:  *FEVER GREATER THAN 100.5 F  *CHILLS WITH OR WITHOUT FEVER  NAUSEA AND VOMITING THAT IS NOT CONTROLLED WITH YOUR NAUSEA MEDICATION  *UNUSUAL SHORTNESS OF BREATH  *UNUSUAL BRUISING OR BLEEDING  TENDERNESS IN MOUTH AND THROAT WITH OR WITHOUT PRESENCE OF ULCERS  *URINARY PROBLEMS  *BOWEL PROBLEMS  UNUSUAL RASH Items with * indicate a potential emergency and should be followed up as soon as possible.  Feel free to call the clinic you have any questions or concerns. The clinic phone number is [phone].  Please show the [place] at check-in to the Emergency Department and triage nurse.  Oxaliplatin Injection What is this medicine? OXALIPLATIN (ox AL i PLA tin) is a chemotherapy drug. It targets fast dividing cells, like cancer cells, and causes these cells to die. This medicine is used to treat cancers of the colon and rectum, and many other cancers. This medicine may be used for other purposes; ask your health care provider or pharmacist if you have questions. COMMON BRAND NAME(S): Eloxatin What should I tell my health care provider before I take this medicine? They need to know if you have any of these conditions: -kidney disease -an unusual or allergic reaction to oxaliplatin, other chemotherapy, other medicines, foods, dyes, or preservatives -pregnant or trying to get pregnant -breast-feeding How should I use this medicine? This drug is given as an infusion into a vein. It is administered in a hospital or  clinic by a specially trained health care professional. Talk to your pediatrician regarding the use of this medicine in children. Special care may be needed. Overdosage: If you think you have taken too much of this medicine contact a poison control center or emergency room at once. NOTE: This medicine is only for you. Do not share this medicine with others. What if I miss a dose? It is important not to miss a dose. Call your doctor or health care professional if you are unable to keep an appointment. What may interact with this medicine? -medicines to increase blood counts like filgrastim, pegfilgrastim, sargramostim -probenecid -some antibiotics like amikacin, gentamicin, neomycin, polymyxin B, streptomycin, tobramycin -zalcitabine Talk to your doctor or health care professional before taking any of these medicines: -acetaminophen -aspirin -ibuprofen -ketoprofen -naproxen This list may not describe all possible interactions. Give your health care provider a list of all the medicines, herbs, non-prescription drugs, or dietary supplements you use. Also tell them if you smoke, drink alcohol, or use illegal drugs. Some items may interact with your medicine. What should I watch for while using this medicine? Your condition will be monitored carefully while you are receiving this medicine. You will need important blood work done while you are taking this medicine. This medicine can make you more sensitive to cold. Do not drink cold drinks or use ice. Cover exposed skin before coming in contact with cold temperatures or cold objects. When out in cold weather wear warm clothing and cover your mouth and nose to warm the air that goes into your lungs. Tell your doctor if you get sensitive to the  cold. This drug may make you feel generally unwell. This is not uncommon, as chemotherapy can affect healthy cells as well as cancer cells. Report any side effects. Continue your course of treatment even though  you feel ill unless your doctor tells you to stop. In some cases, you may be given additional medicines to help with side effects. Follow all directions for their use. Call your doctor or health care professional for advice if you get a fever, chills or sore throat, or other symptoms of a cold or flu. Do not treat yourself. This drug decreases your body's ability to fight infections. Try to avoid being around people who are sick. This medicine may increase your risk to bruise or bleed. Call your doctor or health care professional if you notice any unusual bleeding. Be careful brushing and flossing your teeth or using a toothpick because you may get an infection or bleed more easily. If you have any dental work done, tell your dentist you are receiving this medicine. Avoid taking products that contain aspirin, acetaminophen, ibuprofen, naproxen, or ketoprofen unless instructed by your doctor. These medicines may hide a fever. Do not become pregnant while taking this medicine. Women should inform their doctor if they wish to become pregnant or think they might be pregnant. There is a potential for serious side effects to an unborn child. Talk to your health care professional or pharmacist for more information. Do not breast-feed an infant while taking this medicine. Call your doctor or health care professional if you get diarrhea. Do not treat yourself. What side effects may I notice from receiving this medicine? Side effects that you should report to your doctor or health care professional as soon as possible: -allergic reactions like skin rash, itching or hives, swelling of the face, lips, or tongue -low blood counts - This drug may decrease the number of white blood cells, red blood cells and platelets. You may be at increased risk for infections and bleeding. -signs of infection - fever or chills, cough, sore throat, pain or difficulty passing urine -signs of decreased platelets or bleeding -  bruising, pinpoint red spots on the skin, black, tarry stools, nosebleeds -signs of decreased red blood cells - unusually weak or tired, fainting spells, lightheadedness -breathing problems -chest pain, pressure -cough -diarrhea -jaw tightness -mouth sores -nausea and vomiting -pain, swelling, redness or irritation at the injection site -pain, tingling, numbness in the hands or feet -problems with balance, talking, walking -redness, blistering, peeling or loosening of the skin, including inside the mouth -trouble passing urine or change in the amount of urine Side effects that usually do not require medical attention (report to your doctor or health care professional if they continue or are bothersome): -changes in vision -constipation -hair loss -loss of appetite -metallic taste in the mouth or changes in taste -stomach pain This list may not describe all possible side effects. Call your doctor for medical advice about side effects. You may report side effects to FDA at 1-800-FDA-1088. Where should I keep my medicine? This drug is given in a hospital or clinic and will not be stored at home. NOTE: This sheet is a summary. It may not cover all possible information. If you have questions about this medicine, talk to your doctor, pharmacist, or health care provider.  2017 Elsevier/Gold Standard (2007-11-10 17:22:47)  Leucovorin injection What is this medicine? LEUCOVORIN (loo koe VOR in) is used to prevent or treat the harmful effects of some medicines. This medicine is used to  treat anemia caused by a low amount of folic acid in the body. It is also used with 5-fluorouracil (5-FU) to treat colon cancer. This medicine may be used for other purposes; ask your health care provider or pharmacist if you have questions. What should I tell my health care provider before I take this medicine? They need to know if you have any of these conditions: -anemia from low levels of vitamin B-12 in  the blood -an unusual or allergic reaction to leucovorin, folic acid, other medicines, foods, dyes, or preservatives -pregnant or trying to get pregnant -breast-feeding How should I use this medicine? This medicine is for injection into a muscle or into a vein. It is given by a health care professional in a hospital or clinic setting. Talk to your pediatrician regarding the use of this medicine in children. Special care may be needed. Overdosage: If you think you have taken too much of this medicine contact a poison control center or emergency room at once. NOTE: This medicine is only for you. Do not share this medicine with others. What if I miss a dose? This does not apply. What may interact with this medicine? -capecitabine -fluorouracil -phenobarbital -phenytoin -primidone -trimethoprim-sulfamethoxazole This list may not describe all possible interactions. Give your health care provider a list of all the medicines, herbs, non-prescription drugs, or dietary supplements you use. Also tell them if you smoke, drink alcohol, or use illegal drugs. Some items may interact with your medicine. What should I watch for while using this medicine? Your condition will be monitored carefully while you are receiving this medicine. This medicine may increase the side effects of 5-fluorouracil, 5-FU. Tell your doctor or health care professional if you have diarrhea or mouth sores that do not get better or that get worse. What side effects may I notice from receiving this medicine? Side effects that you should report to your doctor or health care professional as soon as possible: -allergic reactions like skin rash, itching or hives, swelling of the face, lips, or tongue -breathing problems -fever, infection -mouth sores -unusual bleeding or bruising -unusually weak or tired Side effects that usually do not require medical attention (report to your doctor or health care professional if they continue or  are bothersome): -constipation or diarrhea -loss of appetite -nausea, vomiting This list may not describe all possible side effects. Call your doctor for medical advice about side effects. You may report side effects to FDA at 1-800-FDA-1088. Where should I keep my medicine? This drug is given in a hospital or clinic and will not be stored at home. NOTE: This sheet is a summary. It may not cover all possible information. If you have questions about this medicine, talk to your doctor, pharmacist, or health care provider.  2017 Elsevier/Gold Standard (2007-10-20 16:50:29)  Fluorouracil, 5-FU injection What is this medicine? FLUOROURACIL, 5-FU (flure oh YOOR a sil) is a chemotherapy drug. It slows the growth of cancer cells. This medicine is used to treat many types of cancer like breast cancer, colon or rectal cancer, pancreatic cancer, and stomach cancer. This medicine may be used for other purposes; ask your health care provider or pharmacist if you have questions. COMMON BRAND NAME(S): Adrucil What should I tell my health care provider before I take this medicine? They need to know if you have any of these conditions: -blood disorders -dihydropyrimidine dehydrogenase (DPD) deficiency -infection (especially a virus infection such as chickenpox, cold sores, or herpes) -kidney disease -liver disease -malnourished, poor nutrition -recent  or ongoing radiation therapy -an unusual or allergic reaction to fluorouracil, other chemotherapy, other medicines, foods, dyes, or preservatives -pregnant or trying to get pregnant -breast-feeding How should I use this medicine? This drug is given as an infusion or injection into a vein. It is administered in a hospital or clinic by a specially trained health care professional. Talk to your pediatrician regarding the use of this medicine in children. Special care may be needed. Overdosage: If you think you have taken too much of this medicine contact a  poison control center or emergency room at once. NOTE: This medicine is only for you. Do not share this medicine with others. What if I miss a dose? It is important not to miss your dose. Call your doctor or health care professional if you are unable to keep an appointment. What may interact with this medicine? -allopurinol -cimetidine -dapsone -digoxin -hydroxyurea -leucovorin -levamisole -medicines for seizures like ethotoin, fosphenytoin, phenytoin -medicines to increase blood counts like filgrastim, pegfilgrastim, sargramostim -medicines that treat or prevent blood clots like warfarin, enoxaparin, and dalteparin -methotrexate -metronidazole -pyrimethamine -some other chemotherapy drugs like busulfan, cisplatin, estramustine, vinblastine -trimethoprim -trimetrexate -vaccines Talk to your doctor or health care professional before taking any of these medicines: -acetaminophen -aspirin -ibuprofen -ketoprofen -naproxen This list may not describe all possible interactions. Give your health care provider a list of all the medicines, herbs, non-prescription drugs, or dietary supplements you use. Also tell them if you smoke, drink alcohol, or use illegal drugs. Some items may interact with your medicine. What should I watch for while using this medicine? Visit your doctor for checks on your progress. This drug may make you feel generally unwell. This is not uncommon, as chemotherapy can affect healthy cells as well as cancer cells. Report any side effects. Continue your course of treatment even though you feel ill unless your doctor tells you to stop. In some cases, you may be given additional medicines to help with side effects. Follow all directions for their use. Call your doctor or health care professional for advice if you get a fever, chills or sore throat, or other symptoms of a cold or flu. Do not treat yourself. This drug decreases your body's ability to fight infections. Try to  avoid being around people who are sick. This medicine may increase your risk to bruise or bleed. Call your doctor or health care professional if you notice any unusual bleeding. Be careful brushing and flossing your teeth or using a toothpick because you may get an infection or bleed more easily. If you have any dental work done, tell your dentist you are receiving this medicine. Avoid taking products that contain aspirin, acetaminophen, ibuprofen, naproxen, or ketoprofen unless instructed by your doctor. These medicines may hide a fever. Do not become pregnant while taking this medicine. Women should inform their doctor if they wish to become pregnant or think they might be pregnant. There is a potential for serious side effects to an unborn child. Talk to your health care professional or pharmacist for more information. Do not breast-feed an infant while taking this medicine. Men should inform their doctor if they wish to father a child. This medicine may lower sperm counts. Do not treat diarrhea with over the counter products. Contact your doctor if you have diarrhea that lasts more than 2 days or if it is severe and watery. This medicine can make you more sensitive to the sun. Keep out of the sun. If you cannot avoid being in the   sun, wear protective clothing and use sunscreen. Do not use sun lamps or tanning beds/booths. What side effects may I notice from receiving this medicine? Side effects that you should report to your doctor or health care professional as soon as possible: -allergic reactions like skin rash, itching or hives, swelling of the face, lips, or tongue -low blood counts - this medicine may decrease the number of white blood cells, red blood cells and platelets. You may be at increased risk for infections and bleeding. -signs of infection - fever or chills, cough, sore throat, pain or difficulty passing urine -signs of decreased platelets or bleeding - bruising, pinpoint red spots  on the skin, black, tarry stools, blood in the urine -signs of decreased red blood cells - unusually weak or tired, fainting spells, lightheadedness -breathing problems -changes in vision -chest pain -mouth sores -nausea and vomiting -pain, swelling, redness at site where injected -pain, tingling, numbness in the hands or feet -redness, swelling, or sores on hands or feet -stomach pain -unusual bleeding Side effects that usually do not require medical attention (report to your doctor or health care professional if they continue or are bothersome): -changes in finger or toe nails -diarrhea -dry or itchy skin -hair loss -headache -loss of appetite -sensitivity of eyes to the light -stomach upset -unusually teary eyes This list may not describe all possible side effects. Call your doctor for medical advice about side effects. You may report side effects to FDA at 1-800-FDA-1088. Where should I keep my medicine? This drug is given in a hospital or clinic and will not be stored at home. NOTE: This sheet is a summary. It may not cover all possible information. If you have questions about this medicine, talk to your doctor, pharmacist, or health care provider.  2017 Elsevier/Gold Standard (2007-08-19 13:53:16)

## 2016-06-25 ENCOUNTER — Telehealth: Payer: Self-pay | Admitting: *Deleted

## 2016-06-25 ENCOUNTER — Other Ambulatory Visit: Payer: Self-pay | Admitting: *Deleted

## 2016-06-25 ENCOUNTER — Telehealth: Payer: Self-pay | Admitting: Oncology

## 2016-06-25 DIAGNOSIS — C154 Malignant neoplasm of middle third of esophagus: Secondary | ICD-10-CM

## 2016-06-25 NOTE — Telephone Encounter (Signed)
lvm to inform pt of added lab/ivf appt 2/28 per LOS

## 2016-06-25 NOTE — Addendum Note (Signed)
Addended by: Rosalio Macadamia C on: 06/25/2016 10:14 AM   Modules accepted: Orders

## 2016-06-25 NOTE — Telephone Encounter (Signed)
Pt returned call, he reports he feels well today. "Stronger than when I came in yesterday." Reviewed antiemetic instructions. He reports he developed indigestion/ heartburn after taking Compazine last night. He will take Compazine only as needed going forward. He understands to call office if unable to tolerate this. Informed him of appt changes for 2/28.

## 2016-06-26 ENCOUNTER — Telehealth: Payer: Self-pay

## 2016-06-26 ENCOUNTER — Other Ambulatory Visit: Payer: Self-pay | Admitting: Nurse Practitioner

## 2016-06-26 ENCOUNTER — Other Ambulatory Visit (HOSPITAL_BASED_OUTPATIENT_CLINIC_OR_DEPARTMENT_OTHER): Payer: BLUE CROSS/BLUE SHIELD

## 2016-06-26 ENCOUNTER — Ambulatory Visit: Payer: BLUE CROSS/BLUE SHIELD | Admitting: Nurse Practitioner

## 2016-06-26 VITALS — BP 107/74 | HR 82 | Temp 98.0°F | Resp 18 | Ht 71.0 in

## 2016-06-26 DIAGNOSIS — C154 Malignant neoplasm of middle third of esophagus: Secondary | ICD-10-CM | POA: Diagnosis not present

## 2016-06-26 DIAGNOSIS — C7801 Secondary malignant neoplasm of right lung: Secondary | ICD-10-CM

## 2016-06-26 DIAGNOSIS — C7951 Secondary malignant neoplasm of bone: Secondary | ICD-10-CM

## 2016-06-26 DIAGNOSIS — C771 Secondary and unspecified malignant neoplasm of intrathoracic lymph nodes: Secondary | ICD-10-CM

## 2016-06-26 LAB — COMPREHENSIVE METABOLIC PANEL
ALK PHOS: 140 U/L (ref 40–150)
ALT: 21 U/L (ref 0–55)
AST: 35 U/L — AB (ref 5–34)
Albumin: 3.1 g/dL — ABNORMAL LOW (ref 3.5–5.0)
Anion Gap: 12 mEq/L — ABNORMAL HIGH (ref 3–11)
BUN: 15.2 mg/dL (ref 7.0–26.0)
CALCIUM: 10.9 mg/dL — AB (ref 8.4–10.4)
CO2: 26 mEq/L (ref 22–29)
CREATININE: 1.1 mg/dL (ref 0.7–1.3)
Chloride: 101 mEq/L (ref 98–109)
EGFR: 70 mL/min/{1.73_m2} — ABNORMAL LOW (ref >90)
Glucose: 111 mg/dl (ref 70–140)
Potassium: 3.7 mEq/L (ref 3.5–5.1)
Sodium: 138 mEq/L (ref 136–145)
TOTAL PROTEIN: 6.8 g/dL (ref 6.4–8.3)
Total Bilirubin: 0.59 mg/dL (ref 0.20–1.20)

## 2016-06-26 MED ORDER — HEPARIN SOD (PORK) LOCK FLUSH 100 UNIT/ML IV SOLN
500.0000 [IU] | Freq: Once | INTRAVENOUS | Status: AC
  Administered 2016-06-26: 500 [IU] via INTRAVENOUS
  Filled 2016-06-26: qty 5

## 2016-06-26 MED ORDER — SODIUM CHLORIDE 0.9% FLUSH
10.0000 mL | Freq: Once | INTRAVENOUS | Status: AC
  Administered 2016-06-26: 10 mL via INTRAVENOUS
  Filled 2016-06-26: qty 10

## 2016-06-26 NOTE — Telephone Encounter (Signed)
Patients wife called with questions. Returned call, pt requesting information on when and how to put his numbing cream on. Pt is scheduled for pump d/c and ivf today. Informed pt the needle is already placed and numbing cream is not needed today. Informed pt education will be provided on application of cream when he comes in today. Pt verbalized understanding.

## 2016-06-26 NOTE — Progress Notes (Signed)
5FU pump d/c  Labs obtained to check Ca++  Ca++ better today than 2 days ago. No treatment required per Ned Card, NP. OK to deaccess port and pt can go home.  RN visit only

## 2016-06-27 ENCOUNTER — Ambulatory Visit: Payer: BLUE CROSS/BLUE SHIELD | Admitting: Nurse Practitioner

## 2016-06-27 ENCOUNTER — Telehealth: Payer: Self-pay | Admitting: *Deleted

## 2016-06-27 NOTE — Progress Notes (Signed)
Billy Romero 61 y.o. 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  Pain: Skin change:                                   Wears patch over right eye Nausea/Vomiting/Ataxia: Visional changes(Blurred/double vision, blind spots and peripheral vision changes): Fatigue: Cognitive changes: Weight:

## 2016-06-27 NOTE — Telephone Encounter (Signed)
"  My husband had chemotherapy Monday.  He took [place] lax Wednesday.  Today he went all over the place.  I used wash cloths to clean everything up but need to know if I'm in any danger and do I need to put his things in a bio hazard bag?  I did not get anything on me or my hands but did not use gloves.  It did not occur to me until after I cleaned everything.  He is tired, weak and resting.  Drinking Gatorade"   Spoke with Dominica Severin who sounds pleasant and energetic.   "I had a bad case of constipation three days ago so the last few days I took [employer] and it went too far.  I hadn't gone again.  I'm fine.  I wasn't aware of the chemotherapy side effect of diarrhea."  5-FU Pump was disconnected 06-26-2016.  No risk or danger due to reports no contact on her skin or clothing.  Asked that gloves be in the home and used. Good hand washing using hand sanitizer.  Be careful with all body fluids, vomit, urine, stool.  Up to 72 hours after treatments should close commode, flush twice, Use condoms, wash clothes separate from other laundry.  For chemotherapy spills use the spill kit provided.  No further questions at this time.

## 2016-07-02 ENCOUNTER — Other Ambulatory Visit (HOSPITAL_BASED_OUTPATIENT_CLINIC_OR_DEPARTMENT_OTHER): Payer: BLUE CROSS/BLUE SHIELD

## 2016-07-02 ENCOUNTER — Ambulatory Visit: Payer: BLUE CROSS/BLUE SHIELD

## 2016-07-02 ENCOUNTER — Ambulatory Visit
Admission: RE | Admit: 2016-07-02 | Discharge: 2016-07-02 | Disposition: A | Discharge status: Home / Self Care | Payer: BLUE CROSS/BLUE SHIELD | Source: Ambulatory Visit | Attending: Radiation Oncology | Admitting: Radiation Oncology

## 2016-07-02 ENCOUNTER — Encounter: Payer: Self-pay | Admitting: Radiation Oncology

## 2016-07-02 VITALS — BP 92/80 | HR 115 | Temp 97.6°F | Resp 18 | Ht 71.0 in | Wt 185.2 lb

## 2016-07-02 DIAGNOSIS — C7951 Secondary malignant neoplasm of bone: Secondary | ICD-10-CM

## 2016-07-02 DIAGNOSIS — C7801 Secondary malignant neoplasm of right lung: Secondary | ICD-10-CM | POA: Diagnosis not present

## 2016-07-02 DIAGNOSIS — C159 Malignant neoplasm of esophagus, unspecified: Secondary | ICD-10-CM | POA: Diagnosis not present

## 2016-07-02 DIAGNOSIS — C154 Malignant neoplasm of middle third of esophagus: Secondary | ICD-10-CM

## 2016-07-02 DIAGNOSIS — Z95828 Presence of other vascular implants and grafts: Secondary | ICD-10-CM

## 2016-07-02 DIAGNOSIS — C771 Secondary and unspecified malignant neoplasm of intrathoracic lymph nodes: Secondary | ICD-10-CM | POA: Diagnosis not present

## 2016-07-02 LAB — COMPREHENSIVE METABOLIC PANEL
ALT: 29 U/L (ref 0–55)
ANION GAP: 10 meq/L (ref 3–11)
AST: 28 U/L (ref 5–34)
Albumin: 3.2 g/dL — ABNORMAL LOW (ref 3.5–5.0)
Alkaline Phosphatase: 139 U/L (ref 40–150)
BILIRUBIN TOTAL: 0.49 mg/dL (ref 0.20–1.20)
BUN: 13.6 mg/dL (ref 7.0–26.0)
CALCIUM: 10.2 mg/dL (ref 8.4–10.4)
CO2: 25 mEq/L (ref 22–29)
CREATININE: 1 mg/dL (ref 0.7–1.3)
Chloride: 103 mEq/L (ref 98–109)
EGFR: 81 mL/min/{1.73_m2} — ABNORMAL LOW (ref >90)
Glucose: 101 mg/dl (ref 70–140)
Potassium: 3.7 mEq/L (ref 3.5–5.1)
Sodium: 139 mEq/L (ref 136–145)
TOTAL PROTEIN: 6.5 g/dL (ref 6.4–8.3)

## 2016-07-02 MED ORDER — HEPARIN SOD (PORK) LOCK FLUSH 100 UNIT/ML IV SOLN
500.0000 [IU] | Freq: Once | INTRAVENOUS | Status: AC | PRN
  Administered 2016-07-02: 500 [IU] via INTRAVENOUS
  Filled 2016-07-02: qty 5

## 2016-07-02 MED ORDER — SODIUM CHLORIDE 0.9% FLUSH
10.0000 mL | INTRAVENOUS | Status: DC | PRN
  Administered 2016-07-02: 10 mL via INTRAVENOUS
  Filled 2016-07-02: qty 10

## 2016-07-02 NOTE — Progress Notes (Signed)
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  Interval Since Last Radiation: 7 weeks  05/02/16 - 05/15/16:  Right Orbit treated to 30 Gy in 10 fractions. Cranium treated to 30 Gy in 10 fractions.  Narrative:  The patient returns today for routine follow-up.  The patient has completed radiation treatment and has recovered well from the effects of radiotherapy. He reported some blurry vision and diplopia in his right eye which persisted throughout treatment but was greatly improved from his baseline prior to starting radiotherapy.  He reports that he has continued to notice gradual improvement in his vision overall. He wears a patch over his right eye when driving but has not been doing any driving over the past month. He does continue to have intermittent diplopia and blurred vision. He was recently started on salvage chemotherapy with FOLFOX on 06/24/2016.    He has a follow-up appointment with Dr. Benay Spice on 07/08/2016.                      On review of systems, the patient states that overall he is doing well. He denies nausea vomiting or diarrhea. He denies headaches or other neurologic complaints. He has noticed some recent itching on his scalp associated with mild flaking skin but denies pain or erythema. He has also noticed some new hair growth as of just recently. He continues with fatigue and depressed appetite but overall feels that he is maintaining his weight and eating an appropriate diet. He has recently recovered from influenza followed by pneumonia. He currently denies cough, shortness of breath or dyspnea. He was recently hospitalized in February with  pulmonary embolism and has been maintained on Xarelto which he is currently taking twice daily. He denies any chest pain and reports that he is taking his medications as prescribed.  ALLERGIES:  has No Known Allergies.  Meds: Current Outpatient Prescriptions  Medication Sig Dispense Refill  . azithromycin (ZITHROMAX Z-PAK) 250 MG tablet 1 tab PO qday x 4 days, zero refills (Patient not taking: Reported on 07/02/2016) 4 each 0  . baclofen (LIORESAL) 10 MG tablet Take 10 mg by mouth at bedtime.     . diclofenac sodium (VOLTAREN) 1 % GEL Apply 4 g topically 4 (four) times daily as needed (pain).   2  . esomeprazole (NEXIUM) 20 MG capsule Take 20 mg by mouth daily at 12 noon.    Marland Kitchen HYDROcodone-acetaminophen (NORCO/VICODIN) 5-325 MG tablet Take 1 tablet by mouth 3 (three) times daily as needed for moderate pain. for pain 60 tablet 0  . lidocaine-prilocaine (EMLA) cream Apply to port 1 hour prior to port access and cover with plastic wrap 30 g 0  . prochlorperazine (COMPAZINE) 10 MG tablet Take 1 tablet (10 mg total) by mouth every 6 (six) hours as needed for nausea or vomiting. (Patient not taking: Reported on 07/02/2016) 30 tablet 0  . Rivaroxaban 15 & 20 MG TBPK Take as directed on package: Start with one 75m tablet by mouth twice a day with food. On Day 22, switch to one 256mtablet once a day with food. 51 each 0   No  current facility-administered medications for this encounter.    Wt Readings from Last 3 Encounters:  07/02/16 185 lb 3.2 oz (84 kg)  06/18/16 186 lb 3.2 oz (84.5 kg)  06/13/16 190 lb 14.7 oz (86.6 kg)   Temp Readings from Last 3 Encounters:  07/02/16 97.6 F (36.4 C) (Oral)  06/26/16 98 F (36.7 C) (Oral)  06/24/16 98.1 F (36.7 C) (Oral)   BP Readings from Last 3 Encounters:  07/02/16 92/80  06/26/16 107/74  06/24/16 110/71   Pulse Readings from Last 3 Encounters:  07/02/16 (!) 115  06/26/16 82  06/24/16 97   Physical Finding In general this is a well appearing  Caucasian male in no acute distress. He's alert and oriented x4 and appropriate throughout the examination. Cardiopulmonary assessment is negative for acute distress and he exhibits normal effort.   Lab Findings: Lab Results  Component Value Date   WBC 8.7 06/24/2016   HGB 14.3 06/24/2016   HCT 41.2 06/24/2016   MCV 87.3 06/24/2016   PLT 383 06/24/2016     Radiographic Findings: Dg Chest 2 View  Result Date: 06/13/2016 CLINICAL DATA:  Persistent cough for 2 weeks and weakness. Pleuritic left chest pain. History esophageal cancer. EXAM: CHEST  2 VIEW COMPARISON:  05/27/2016 FINDINGS: A right jugular Port-A-Cath has been placed and terminates over the lower SVC. The cardiomediastinal silhouette is within normal limits. There is mild patchy and nodular opacity in the left lung base which is new or increased from the prior study. The right lung is clear. No pleural effusion or pneumothorax is identified. Thoracic spondylosis is noted. IMPRESSION: New mild left basilar opacity which may reflect atelectasis or early pneumonia. Follow-up radiographs are recommended to ensure resolution given the mildly nodular appearance and history of esophageal cancer. Electronically Signed   By: Logan Bores M.D.   On: 06/13/2016 17:56   Ct Angio Chest Pe W And/or Wo Contrast  Result Date: 06/13/2016 CLINICAL DATA:  61 y/o M; generalized weakness, tachycardia, left-sided chest pain. Esophageal cancer post radiation 01/18. Recent port placement. Treated for fluid pneumonia 1 week ago. EXAM: CT ANGIOGRAPHY CHEST WITH CONTRAST TECHNIQUE: Multidetector CT imaging of the chest was performed using the standard protocol during bolus administration of intravenous contrast. Multiplanar CT image reconstructions and MIPs were obtained to evaluate the vascular anatomy. CONTRAST:  100 cc Isovue 370 COMPARISON:  06/13/2016 chest radiograph.  04/25/2016 chest CT. FINDINGS: Cardiovascular: Satisfactory opacification of pulmonary  arteries. Segmental pulmonary embolus within the left lower lobe basal lateral segmental artery (series 7, image 142). RV/ LV index = 0.8. Normal size main pulmonary artery and thoracic aorta. Normal heart size. Small pericardial effusion. Mediastinum/Nodes: Mediastinal lymphadenopathy is stable or increased in size in comparison with the prior CT of the chest for example an aortopulmonary lymph node measures 19 mm short axis, previously 17 mm (series 7, image 102) ; a left hilar node measures 15 mm, previously 13 mm (series 7, image 124). Additionally, there is progressive narrowing of multiple pulmonary artery is and pulmonary veins likely due to tumor infiltration for example in the left upper lobe lingular pulmonary artery origin (series 7, image 110). Stable thickening of the wall of the mid esophagus. Lungs/Pleura: There is stenosis or occlusion of multiple lobar and segmental bronchi due to mass effect from hilar adenopathy which has progressed from the prior CT. Left upper lobe pulmonary nodules are increased in size for example along the left major fissure measuring 13 mm, previously 9 mm (series 8, image  44). There has been interval development of nodular opacities within the hilar region of the right lower lobe with septal thickening and peribronchial thickening suspicious for lymphangitic carcinomatosis. Small left pleural effusion. Paramediastinal reticulation may represent posttreatment changes. Upper Abdomen: Stable gastrohepatic lymphadenopathy measuring 16 mm short axis (series 7, image 237). Musculoskeletal: No acute osseous abnormality is identified. Review of the MIP images confirms the above findings. IMPRESSION: 1. Positive for pulmonary embolus of a segmental left lower lobe pulmonary artery. No evidence for right heart strain. 2. Increasing mediastinal and hilar lymphadenopathy with progressive stenosis of multiple pulmonary artery and bronchial lobar and segmental branch origins. 3. 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 4. Stable wall thickening of mid esophagus. 5. Paramediastinal reticulation of the lungs may represent radiation changes. 6. Stable gastrohepatic lymphadenopathy. These results were called by telephone at the time of interpretation on 06/13/2016 at 8:54 pm to Dr. Zenovia Jarred , who verbally acknowledged these results. Electronically Signed   By: Kristine Garbe M.D.   On: 06/13/2016 20:58   Ir US Guide Vasc Access Right  Result Date: 06/11/2016 INDICATION: History of esophageal cancer. In need of durable intravenous access for chemotherapy administration. EXAM: IMPLANTED PORT A CATH PLACEMENT WITH ULTRASOUND AND FLUOROSCOPIC GUIDANCE COMPARISON:  None. MEDICATIONS: Ancef 2 gm IV; The antibiotic was administered within an appropriate time interval prior to skin puncture. ANESTHESIA/SEDATION: Moderate (conscious) sedation was employed during this procedure. A total of Versed 2.5 mg and Fentanyl 125 mcg was administered intravenously. Moderate Sedation Time: 28 minutes. The patient's level of consciousness and vital signs were monitored continuously by radiology nursing throughout the procedure under my direct supervision. CONTRAST:  None FLUOROSCOPY TIME:  36 seconds (8 mGy) COMPLICATIONS: None immediate. PROCEDURE: The procedure, risks, benefits, and alternatives were explained to the patient. Questions regarding the procedure were encouraged and answered. The patient understands and consents to the procedure. The right neck and chest were prepped with chlorhexidine in a sterile fashion, and a sterile drape was applied covering the operative field. Maximum barrier sterile technique with sterile gowns and gloves were used for the procedure. A timeout was performed prior to the  initiation of the procedure. Local anesthesia was provided with 1% lidocaine with epinephrine. After creating a small venotomy incision, a micropuncture kit was utilized to access the internal jugular vein under direct, real-time ultrasound guidance. Ultrasound image documentation was performed. The microwire was kinked to measure appropriate catheter length. A subcutaneous port pocket was then created along the upper chest wall utilizing a combination of sharp and blunt dissection. The pocket was irrigated with sterile saline. A single lumen thin power injectable port was chosen for placement. The 8 Fr catheter was tunneled from the port pocket site to the venotomy incision. The port was placed in the pocket. The external catheter was trimmed to appropriate length. At the venotomy, an 8 Fr peel-away sheath was placed over a guidewire under fluoroscopic guidance. The catheter was then placed through the sheath and the sheath was removed. Final catheter positioning was confirmed and documented with a fluoroscopic spot radiograph. The port was accessed with a Huber needle, aspirated and flushed with heparinized saline. The venotomy site was closed with an interrupted 4-0 Vicryl suture. The port pocket incision was closed with interrupted 2-0 Vicryl suture and the skin was opposed with a running subcuticular 4-0 Vicryl suture. Dermabond and  Steri-strips were applied to both incisions. Dressings were placed. The patient tolerated the procedure well without immediate post procedural complication. FINDINGS: After catheter placement, the tip lies within the superior cavoatrial junction. The catheter aspirates and flushes normally and is ready for immediate use. IMPRESSION: Successful placement of a right internal jugular approach power injectable Port-A-Cath. The catheter is ready for immediate use. Electronically Signed   By: Sandi Mariscal M.D.   On: 06/11/2016 15:42   Ir Fluoro Guide Port Insertion Right  Result Date:  06/11/2016 INDICATION: History of esophageal cancer. In need of durable intravenous access for chemotherapy administration. EXAM: IMPLANTED PORT A CATH PLACEMENT WITH ULTRASOUND AND FLUOROSCOPIC GUIDANCE COMPARISON:  None. MEDICATIONS: Ancef 2 gm IV; The antibiotic was administered within an appropriate time interval prior to skin puncture. ANESTHESIA/SEDATION: Moderate (conscious) sedation was employed during this procedure. A total of Versed 2.5 mg and Fentanyl 125 mcg was administered intravenously. Moderate Sedation Time: 28 minutes. The patient's level of consciousness and vital signs were monitored continuously by radiology nursing throughout the procedure under my direct supervision. CONTRAST:  None FLUOROSCOPY TIME:  36 seconds (8 mGy) COMPLICATIONS: None immediate. PROCEDURE: The procedure, risks, benefits, and alternatives were explained to the patient. Questions regarding the procedure were encouraged and answered. The patient understands and consents to the procedure. The right neck and chest were prepped with chlorhexidine in a sterile fashion, and a sterile drape was applied covering the operative field. Maximum barrier sterile technique with sterile gowns and gloves were used for the procedure. A timeout was performed prior to the initiation of the procedure. Local anesthesia was provided with 1% lidocaine with epinephrine. After creating a small venotomy incision, a micropuncture kit was utilized to access the internal jugular vein under direct, real-time ultrasound guidance. Ultrasound image documentation was performed. The microwire was kinked to measure appropriate catheter length. A subcutaneous port pocket was then created along the upper chest wall utilizing a combination of sharp and blunt dissection. The pocket was irrigated with sterile saline. A single lumen thin power injectable port was chosen for placement. The 8 Fr catheter was tunneled from the port pocket site to the venotomy  incision. The port was placed in the pocket. The external catheter was trimmed to appropriate length. At the venotomy, an 8 Fr peel-away sheath was placed over a guidewire under fluoroscopic guidance. The catheter was then placed through the sheath and the sheath was removed. Final catheter positioning was confirmed and documented with a fluoroscopic spot radiograph. The port was accessed with a Huber needle, aspirated and flushed with heparinized saline. The venotomy site was closed with an interrupted 4-0 Vicryl suture. The port pocket incision was closed with interrupted 2-0 Vicryl suture and the skin was opposed with a running subcuticular 4-0 Vicryl suture. Dermabond and Steri-strips were applied to both incisions. Dressings were placed. The patient tolerated the procedure well without immediate post procedural complication. FINDINGS: After catheter placement, the tip lies within the superior cavoatrial junction. The catheter aspirates and flushes normally and is ready for immediate use. IMPRESSION: Successful placement of a right internal jugular approach power injectable Port-A-Cath. The catheter is ready for immediate use. Electronically Signed   By: Sandi Mariscal M.D.   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  He has recovered well from the effects of radiotherapy. We are happy to continue  to participate in his care and will see him on an as needed basis. He will continue close follow-up with Dr. Benay Spice and we will follow along with his labs and scan reports. He knows to call at any time with any questions or concerns related to his prior radiotherapy.  Nicholos Johns, PA-C

## 2016-07-02 NOTE — Progress Notes (Signed)
Billy Romero 61 y.o. 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  Pain:Denies having pain. Skin change:None                                    Nausea/Vomiting/Ataxia:Reports having some nausea at times after chemotherapy. Visional changes(Blurred/double vision, blind spots and peripheral vision changes):Having double vision in the right eye reports it has gotten better.  Wears patch over right eye when driving, has not been driving in over a month. Fatigue:Having fatigue all during the day.  Had pneumonia and the flu in January and a blood clot a few weeks later in the left lung taking Rivaroxaban bid  Cognitive changes:Short term memory recall is fine   No difficulty with word finding and completing sentences. Denies feeling feeling any  Dizziness, coughing  Brownish secretion getting less color at intervals, coughing more at night, "states he feels like he is getting a sinus infection." Weight: Eating three meals per day,drinking instant breakfast and pushing his fluid intake unable to tolerate real cold drinks, drinking room temperature liquids. Wt Readings from Last 3 Encounters:  07/02/16 185 lb 3.2 oz (84 kg)  06/18/16 186 lb 3.2 oz (84.5 kg)  06/13/16 190 lb 14.7 oz (86.6 kg)  BP 95/73   Pulse (!) 115   Temp 97.6 F (36.4 C) (Oral)   Resp 18   Ht '5\' 11"'$  (1.803 m)   Wt 185 lb 3.2 oz (84 kg)   SpO2 98%   BMI 25.83 kg/m Right arm sitting BP 92/80   Pulse (!) 115   Temp 97.6 F (36.4 C) (Oral)   Resp 18   Ht '5\' 11"'$  (2.111 m)   Wt 185 lb 3.2 oz (84 kg)   SpO2 98%   BMI 25.83 kg/m  Right arm standing

## 2016-07-02 NOTE — Addendum Note (Signed)
Encounter addended by: Malena Edman, RN on: 07/02/2016  5:19 PM<BR>    Actions taken: Charge Capture section accepted

## 2016-07-04 ENCOUNTER — Ambulatory Visit: Payer: BLUE CROSS/BLUE SHIELD | Admitting: Oncology

## 2016-07-04 ENCOUNTER — Other Ambulatory Visit: Payer: BLUE CROSS/BLUE SHIELD

## 2016-07-07 ENCOUNTER — Other Ambulatory Visit: Payer: Self-pay | Admitting: Oncology

## 2016-07-08 ENCOUNTER — Other Ambulatory Visit (HOSPITAL_BASED_OUTPATIENT_CLINIC_OR_DEPARTMENT_OTHER): Payer: BLUE CROSS/BLUE SHIELD

## 2016-07-08 ENCOUNTER — Telehealth: Payer: Self-pay | Admitting: *Deleted

## 2016-07-08 ENCOUNTER — Ambulatory Visit (HOSPITAL_BASED_OUTPATIENT_CLINIC_OR_DEPARTMENT_OTHER): Payer: BLUE CROSS/BLUE SHIELD

## 2016-07-08 ENCOUNTER — Ambulatory Visit (HOSPITAL_BASED_OUTPATIENT_CLINIC_OR_DEPARTMENT_OTHER): Payer: BLUE CROSS/BLUE SHIELD | Admitting: Oncology

## 2016-07-08 ENCOUNTER — Ambulatory Visit: Payer: BLUE CROSS/BLUE SHIELD

## 2016-07-08 VITALS — BP 80/54 | HR 110

## 2016-07-08 VITALS — BP 100/80 | HR 122 | Temp 98.4°F | Resp 18 | Ht 71.0 in

## 2016-07-08 DIAGNOSIS — H532 Diplopia: Secondary | ICD-10-CM | POA: Diagnosis not present

## 2016-07-08 DIAGNOSIS — C771 Secondary and unspecified malignant neoplasm of intrathoracic lymph nodes: Secondary | ICD-10-CM | POA: Diagnosis not present

## 2016-07-08 DIAGNOSIS — C7801 Secondary malignant neoplasm of right lung: Secondary | ICD-10-CM

## 2016-07-08 DIAGNOSIS — C159 Malignant neoplasm of esophagus, unspecified: Secondary | ICD-10-CM

## 2016-07-08 DIAGNOSIS — C154 Malignant neoplasm of middle third of esophagus: Secondary | ICD-10-CM

## 2016-07-08 DIAGNOSIS — C184 Malignant neoplasm of transverse colon: Secondary | ICD-10-CM

## 2016-07-08 DIAGNOSIS — Z95828 Presence of other vascular implants and grafts: Secondary | ICD-10-CM

## 2016-07-08 DIAGNOSIS — R42 Dizziness and giddiness: Secondary | ICD-10-CM

## 2016-07-08 DIAGNOSIS — I2699 Other pulmonary embolism without acute cor pulmonale: Secondary | ICD-10-CM | POA: Diagnosis not present

## 2016-07-08 LAB — COMPREHENSIVE METABOLIC PANEL
ALBUMIN: 3.2 g/dL — AB (ref 3.5–5.0)
ALK PHOS: 152 U/L — AB (ref 40–150)
ALT: 27 U/L (ref 0–55)
AST: 32 U/L (ref 5–34)
Anion Gap: 10 mEq/L (ref 3–11)
BILIRUBIN TOTAL: 0.88 mg/dL (ref 0.20–1.20)
BUN: 10.7 mg/dL (ref 7.0–26.0)
CO2: 25 mEq/L (ref 22–29)
Calcium: 10.4 mg/dL (ref 8.4–10.4)
Chloride: 104 mEq/L (ref 98–109)
Creatinine: 1.1 mg/dL (ref 0.7–1.3)
EGFR: 69 mL/min/{1.73_m2} — ABNORMAL LOW (ref >90)
Glucose: 108 mg/dl (ref 70–140)
Potassium: 3.8 mEq/L (ref 3.5–5.1)
SODIUM: 140 meq/L (ref 136–145)
TOTAL PROTEIN: 6.5 g/dL (ref 6.4–8.3)

## 2016-07-08 LAB — CBC WITH DIFFERENTIAL/PLATELET
BASO%: 0.2 % (ref 0.0–2.0)
Basophils Absolute: 0 10*3/uL (ref 0.0–0.1)
EOS%: 0.6 % (ref 0.0–7.0)
Eosinophils Absolute: 0 10*3/uL (ref 0.0–0.5)
HCT: 35.1 % — ABNORMAL LOW (ref 38.4–49.9)
HGB: 12.3 g/dL — ABNORMAL LOW (ref 13.0–17.1)
LYMPH%: 25.8 % (ref 14.0–49.0)
MCH: 30.3 pg (ref 27.2–33.4)
MCHC: 35 g/dL (ref 32.0–36.0)
MCV: 86.5 fL (ref 79.3–98.0)
MONO#: 0.5 10*3/uL (ref 0.1–0.9)
MONO%: 9.5 % (ref 0.0–14.0)
NEUT%: 63.9 % (ref 39.0–75.0)
NEUTROS ABS: 3.1 10*3/uL (ref 1.5–6.5)
Platelets: 201 10*3/uL (ref 140–400)
RBC: 4.06 10*6/uL — AB (ref 4.20–5.82)
RDW: 13.7 % (ref 11.0–14.6)
WBC: 4.9 10*3/uL (ref 4.0–10.3)
lymph#: 1.3 10*3/uL (ref 0.9–3.3)

## 2016-07-08 MED ORDER — SODIUM CHLORIDE 0.9% FLUSH
10.0000 mL | INTRAVENOUS | Status: DC | PRN
  Administered 2016-07-08: 10 mL via INTRAVENOUS
  Filled 2016-07-08: qty 10

## 2016-07-08 MED ORDER — RIVAROXABAN 20 MG PO TABS: 20.0000 mg | ORAL_TABLET | Freq: Every day | ORAL | 0 refills | Status: DC

## 2016-07-08 MED ORDER — PROMETHAZINE HCL 12.5 MG PO TABS: 12.5000 mg | ORAL_TABLET | Freq: Four times a day (QID) | ORAL | 1 refills | Status: DC | PRN

## 2016-07-08 MED ORDER — HEPARIN SOD (PORK) LOCK FLUSH 100 UNIT/ML IV SOLN
500.0000 [IU] | Freq: Once | INTRAVENOUS | Status: AC | PRN
  Administered 2016-07-08: 500 [IU] via INTRAVENOUS
  Filled 2016-07-08: qty 5

## 2016-07-08 MED ORDER — SODIUM CHLORIDE 0.9 % IV SOLN
INTRAVENOUS | Status: AC
  Administered 2016-07-08: 13:00:00 via INTRAVENOUS

## 2016-07-08 NOTE — Progress Notes (Signed)
[place] OFFICE PROGRESS NOTE   Diagnosis: Esophagus cancer  INTERVAL HISTORY:   Billy Romero returns as scheduled. He completed a cycle of FOLFOX beginning 06/24/2016. He reports nausea for several days following chemotherapy. Compazine caused "burning". He has noted improvement in dysphagia. Good appetite. He is drinking adequate fluids. He had constipation following chemotherapy and took MiraLAX. He then had diarrhea. The diarrhea has resolved. The diplopia continues to improve. He reports feeling dizzy when ambulating. He has experienced several syncope events over the past few weeks including an event earlier today. He reports being able to remain standing when he feels "dizzy "hives and at other times he passes out. The left-sided chest discomfort has improved.  Objective:  Vital signs in last 24 hours:  Blood pressure 100/80, pulse (!) 122, temperature 98.4 F (36.9 C), temperature source Oral, resp. rate 18, height _0  (1.803 m), SpO2 100 %.    HEENT: The mucous membranes are moist, no thrush or ulcers Resp: Lungs clear bilaterally, no respiratory distress Cardio: Regular rhythm, tachycardia GI: No hepatomegaly, nontender Vascular: No leg edema Neuro: Alert and oriented   Portacath/PICC-without erythema  Lab Results:  Lab Results  Component Value Date   WBC 4.9 07/08/2016   HGB 12.3 (L) 07/08/2016   HCT 35.1 (L) 07/08/2016   MCV 86.5 07/08/2016   PLT 201 07/08/2016   NEUTROABS 3.1 07/08/2016   Potassium 3.8, creatinine 1.1, BUN 10.7, calcium 10.4, albumin 3.2   Medications: I have reviewed the patient's current medications.  Assessment/Plan: 1. Esophagus cancer, squamous cell carcinoma, obstructing mass noted at 30 cm from the incisors on an endoscopy 03/29/2014  Initiation of weekly Taxol/carboplatin and radiation on 04/27/2014  PET scan 05/03/2014 with a hypermetabolic mid esophagus mass and hypermetabolic mediastinal/left  supraclavicular lymph nodes  Weekly Taxol/carboplatin 5 completed 05/25/2014. Radiation completed 06/10/2014  PET scan 07/01/2014 revealed improvement in the hypermetabolic esophagus mass and mediastinal lymphadenopathy  upper endoscopy 04/20/2015 -mid esophagus with mild narrowing , 2 small nodules measuring approximate 5 mm -biopsy with scant and detached fragments with high-grade squamous dysplasia , brushings negative for malignant cells.  CT chest/abdomen/pelvis 05/25/2015 with development of bilateral pulmonary nodules; paratracheal and subcarinal nodes improved/similar; new small lower paraesophageal nodes which are indeterminate; esophageal wall thickening improved since the prior PET; no evidence of metastatic disease in the abdomen or pelvis.  PET scan 06/16/2015 revealed hypermetabolic lung nodules, hypermetabolic mediastinal nodes, hypermetabolism at the mid to distal esophagus has improved  bronchoscopy/EBUS on 07/04/2015 confirmed squamous cell carcinoma involving right lower lobe brushings, right bronchial washings, a level 10 R lymph node biopsy, and right lower lobe biopsy  PD-L1 no expression on the right lower lobe biopsy (less than 1%)  Cycle 1 Pembrolizumab 07/26/2015  Cycle 2 Pembrolizumab 08/16/2015  Cycle 3 Pembrolizumab 09/06/2015  Cycle 4 Pembrolizumab 09/27/2015  CT chest 10/11/2015-stable/decreased size of pulmonary nodules, enlargement of several mediastinal nodes  Cycle 5 Pembrolizumab 10/18/2015  Cycle 6 Pembrolizumab 11/08/2015   Cycle 7 Pembrolizumab08/05/2015  Cycle 8 Pembrolizumab08/23/2017  CT chest 01/15/2016-slight progression of mediastinal/upper abdominal lymphadenopathy and enlargement of a right lower lobe nodule  Cycle 9 Pembrolizumab 01/17/2016   Cycle 10 Pembrolizumab10/02/2016  Cycle 11 Pembrolizumab 02/28/2016  Cycle 12 Pembrolizumab 03/20/2016  CT chest 04/25/2016-minimal progression of chest lymphadenopathy and lung  nodules, thickening at the mid thoracic esophagus  MRI brain 04/30/2016-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 with involvement of the right orbit, no brain metastases  Palliative radiation right sphenoid lesion  beginning 05/02/2016  CT chest 06/13/2016-left lower lobe pulmonary embolus, increased mediastinal and hilar adenopathy, septal thickening and clustered nodules in the right lower lobe, gastrohepatic lymphadenopathy  : 1 FOLFOX 06/24/2016  2. Solid/liquid dysphagia secondary to #1  Jejunostomy feeding tube placement 04/07/2014  Feeding tube removed 05/30/2014.  3. Small bowel obstruction following placement of the jejunostomy feeding tube-resolved  4. History of ulcerative colitis  5. Aspiration pneumonia December 2015  6. 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 Fever/cough/dyspnea 04/30/2016-nasal swab positive for influenza A , chest x-ray with a left lower lobe Pneumonia -treated with Tamiflu and Levaquin   8. Admission 06/13/2016 with a cough/left-sided chest pain/dyspnea/tachycardia/low-grade fever-pulmonary embolism confirmed on a chest CT, progressive chest adenopathy, inflammatory changes versus lymphatic tumor spread in the right lower lobe. Maintained on Xarelto.  9. Port-A-Cath placement 06/11/2016  10. Dysphagia. Barium swallow 05/01/2016 with circumferential narrowing of the midesophagus causing moderate degree of stricture which would not allow the 13 mm barium tablet to pass. The dysphagia improved spontaneously. He developed recurrent dysphagia to solids 06/15/2016. He is tolerating liquids.  11.  Recurrent syncope events-he appears to have orthostasis  12.  Hypercalcemia of malignancy-improved   Billy Romero completed one cycle of FOLFOX 06/24/2016. He tolerated the chemotherapy well other than delayed nausea.  We will adjust the antiemetic regimen with cycle 2. Chemotherapy will be held today secondary to the recurrent syncope events. I suspect this is related to orthostatic hypotension as opposed to a primary cardiac or CNS process. We will check orthostatic vital signs today. He will receive intravenous fluids followed by a repeat of the vital signs. We will check the cortisol level.  Billy Romero will return for an office visit and schedule chemotherapy on 07/15/2016. I recommended he obtain a shower chair and not go up or down the stairs alone.  30 minutes were spent with the patient today. The majority of the time was used for counseling and coordination of care.   Betsy Coder, MD  07/08/2016  12:02 PM

## 2016-07-08 NOTE — Patient Instructions (Signed)
Dehydration, Adult Dehydration is a condition in which there is not enough fluid or water in the body. This happens when you lose more fluids than you take in. Important organs, such as the kidneys, brain, and heart, cannot function without a proper amount of fluids. Any loss of fluids from the body can lead to dehydration. Dehydration can range from mild to severe. This condition should be treated right away to prevent it from becoming severe. What are the causes? This condition may be caused by:  Vomiting.  Diarrhea.  Excessive sweating, such as from heat exposure or exercise.  Not drinking enough fluid, especially:  When ill.  While doing activity that requires a lot of energy.  Excessive urination.  Fever.  Infection.  Certain medicines, such as medicines that cause the body to lose excess fluid (diuretics).  Inability to access safe drinking water.  Reduced physical ability to get adequate water and food. What increases the risk? This condition is more likely to develop in people:  Who have a poorly controlled long-term (chronic) illness, such as diabetes, heart disease, or kidney disease.  Who are age 65 or older.  Who are disabled.  Who live in a place with high altitude.  Who play endurance sports. What are the signs or symptoms? Symptoms of mild dehydration may include:   Thirst.  Dry lips.  Slightly dry mouth.  Dry, warm skin.  Dizziness. Symptoms of moderate dehydration may include:   Very dry mouth.  Muscle cramps.  Dark urine. Urine may be the color of tea.  Decreased urine production.  Decreased tear production.  Heartbeat that is irregular or faster than normal (palpitations).  Headache.  Light-headedness, especially when you stand up from a sitting position.  Fainting (syncope). Symptoms of severe dehydration may include:   Changes in skin, such as:  Cold and clammy skin.  Blotchy (mottled) or pale skin.  Skin that does  not quickly return to normal after being lightly pinched and released (poor skin turgor).  Changes in body fluids, such as:  Extreme thirst.  No tear production.  Inability to sweat when body temperature is high, such as in hot weather.  Very little urine production.  Changes in vital signs, such as:  Weak pulse.  Pulse that is more than 100 beats a minute when sitting still.  Rapid breathing.  Low blood pressure.  Other changes, such as:  Sunken eyes.  Cold hands and feet.  Confusion.  Lack of energy (lethargy).  Difficulty waking up from sleep.  Short-term weight loss.  Unconsciousness. How is this diagnosed? This condition is diagnosed based on your symptoms and a physical exam. Blood and urine tests may be done to help confirm the diagnosis. How is this treated? Treatment for this condition depends on the severity. Mild or moderate dehydration can often be treated at home. Treatment should be started right away. Do not wait until dehydration becomes severe. Severe dehydration is an emergency and it needs to be treated in a hospital. Treatment for mild dehydration may include:   Drinking more fluids.  Replacing salts and minerals in your blood (electrolytes) that you may have lost. Treatment for moderate dehydration may include:   Drinking an oral rehydration solution (ORS). This is a drink that helps you replace fluids and electrolytes (rehydrate). It can be found at pharmacies and retail stores. Treatment for severe dehydration may include:   Receiving fluids through an IV tube.  Receiving an electrolyte solution through a feeding tube that is   passed through your nose and into your stomach (nasogastric tube, or NG tube).  Correcting any abnormalities in electrolytes.  Treating the underlying cause of dehydration. Follow these instructions at home:  If directed by your health care provider, drink an ORS:  Make an ORS by following instructions on the  package.  Start by drinking small amounts, about  cup (120 mL) every 5-10 minutes.  Slowly increase how much you drink until you have taken the amount recommended by your health care provider.  Drink enough clear fluid to keep your urine clear or pale yellow. If you were told to drink an ORS, finish the ORS first, then start slowly drinking other clear fluids. Drink fluids such as:  Water. Do not drink only water. Doing that can lead to having too little salt (sodium) in the body (hyponatremia).  Ice chips.  Fruit juice that you have added water to (diluted fruit juice).  Low-calorie sports drinks.  Avoid:  Alcohol.  Drinks that contain a lot of sugar. These include high-calorie sports drinks, fruit juice that is not diluted, and soda.  Caffeine.  Foods that are greasy or contain a lot of fat or sugar.  Take over-the-counter and prescription medicines only as told by your health care provider.  Do not take sodium tablets. This can lead to having too much sodium in the body (hypernatremia).  Eat foods that contain a healthy balance of electrolytes, such as bananas, oranges, potatoes, tomatoes, and spinach.  Keep all follow-up visits as told by your health care provider. This is important. Contact a health care provider if:  You have abdominal pain that:  Gets worse.  Stays in one area (localizes).  You have a rash.  You have a stiff neck.  You are more irritable than usual.  You are sleepier or more difficult to wake up than usual.  You feel weak or dizzy.  You feel very thirsty.  You have urinated only a small amount of very dark urine over 6-8 hours. Get help right away if:  You have symptoms of severe dehydration.  You cannot drink fluids without vomiting.  Your symptoms get worse with treatment.  You have a fever.  You have a severe headache.  You have vomiting or diarrhea that:  Gets worse.  Does not go away.  You have blood or green matter  (bile) in your vomit.  You have blood in your stool. This may cause stool to look black and tarry.  You have not urinated in 6-8 hours.  You faint.  Your heart rate while sitting still is over 100 beats a minute.  You have trouble breathing. This information is not intended to replace advice given to you by your health care provider. Make sure you discuss any questions you have with your health care provider. Document Released: 04/15/2005 Document Revised: 11/10/2015 Document Reviewed: 06/09/2015 Elsevier Interactive Patient Education  2017 [employer]  

## 2016-07-08 NOTE — Progress Notes (Signed)
Orthostatic BP's discussed with Dr. Benay Spice.  Fluids given.  Cortisol level drawn.  Instructed pt to be extremely careful with standing, going up stairs, showering at home.  Instructed pt not to go up stairs alone if possible.  Instructed pt to look into shower chair and ted hose.  Pt verbalized understanding.

## 2016-07-08 NOTE — Telephone Encounter (Signed)
Message from pt's wife reporting he "collapsed this morning, he blacked out twice yesterday." She reports he has been eating and drinking well. She denies any fever.  Reviewed with Dr. Benay Spice: Bring him in to be seen. Wife doesn't think she can get him here. Instructed her to call EMS to take him to ED for evaluation. She stated she will call her son to help transport him. Strongly recommended she call ED if unable to get him in the car safely.

## 2016-07-08 NOTE — Patient Instructions (Signed)
Implanted Port Home Guide An implanted port is a type of central line that is placed under the skin. Central lines are used to provide IV access when treatment or nutrition needs to be given through a person's veins. Implanted ports are used for long-term IV access. An implanted port may be placed because:  You need IV medicine that would be irritating to the small veins in your hands or arms.  You need long-term IV medicines, such as antibiotics.  You need IV nutrition for a long period.  You need frequent blood draws for lab tests.  You need dialysis.  Implanted ports are usually placed in the chest area, but they can also be placed in the upper arm, the abdomen, or the leg. An implanted port has two main parts:  Reservoir. The reservoir is round and will appear as a small, raised area under your skin. The reservoir is the part where a needle is inserted to give medicines or draw blood.  Catheter. The catheter is a thin, flexible tube that extends from the reservoir. The catheter is placed into a large vein. Medicine that is inserted into the reservoir goes into the catheter and then into the vein.  How will I care for my incision site? Do not get the incision site wet. Bathe or shower as directed by your health care provider. How is my port accessed? Special steps must be taken to access the port:  Before the port is accessed, a numbing cream can be placed on the skin. This helps numb the skin over the port site.  Your health care provider uses a sterile technique to access the port. ? Your health care provider must put on a mask and sterile gloves. ? The skin over your port is cleaned carefully with an antiseptic and allowed to dry. ? The port is gently pinched between sterile gloves, and a needle is inserted into the port.  Only "non-coring" port needles should be used to access the port. Once the port is accessed, a blood return should be checked. This helps ensure that the port  is in the vein and is not clogged.  If your port needs to remain accessed for a constant infusion, a clear (transparent) bandage will be placed over the needle site. The bandage and needle will need to be changed every week, or as directed by your health care provider.  Keep the bandage covering the needle clean and dry. Do not get it wet. Follow your health care provider's instructions on how to take a shower or bath while the port is accessed.  If your port does not need to stay accessed, no bandage is needed over the port.  What is flushing? Flushing helps keep the port from getting clogged. Follow your health care provider's instructions on how and when to flush the port. Ports are usually flushed with saline solution or a medicine called heparin. The need for flushing will depend on how the port is used.  If the port is used for intermittent medicines or blood draws, the port will need to be flushed: ? After medicines have been given. ? After blood has been drawn. ? As part of routine maintenance.  If a constant infusion is running, the port may not need to be flushed.  How long will my port stay implanted? The port can stay in for as long as your health care provider thinks it is needed. When it is time for the port to come out, surgery will be   done to remove it. The procedure is similar to the one performed when the port was put in. When should I seek immediate medical care? When you have an implanted port, you should seek immediate medical care if:  You notice a bad smell coming from the incision site.  You have swelling, redness, or drainage at the incision site.  You have more swelling or pain at the port site or the surrounding area.  You have a fever that is not controlled with medicine.  This information is not intended to replace advice given to you by your health care provider. Make sure you discuss any questions you have with your health care provider. Document  Released: 04/15/2005 Document Revised: 09/21/2015 Document Reviewed: 12/21/2012 Elsevier Interactive Patient Education  2017 [employer]  

## 2016-07-09 LAB — CORTISOL: Cortisol: 6.3 ug/dL

## 2016-07-10 ENCOUNTER — Telehealth: Payer: Self-pay | Admitting: *Deleted

## 2016-07-10 MED ORDER — HYDROCORTISONE 20 MG PO TABS: 20.0000 mg | ORAL_TABLET | Freq: Two times a day (BID) | ORAL | 0 refills | Status: DC

## 2016-07-10 NOTE — Telephone Encounter (Signed)
Telephone call to patient discussed lab results. Per VO from Dr. Benay Spice prescription sent to pharmacy for Hydrocortisone '20mg'$  instead of '25mg'$  BID. Pt understands to call this office with any concerns or questions upon starting this medication. He confirms appointment on 3/19

## 2016-07-10 NOTE — Telephone Encounter (Signed)
-----   Message from Ladell Pier, MD sent at 07/09/2016  8:34 PM EDT ----- Please call patient, cortisol at low end of normal, start hydrocortisone '25mg'$  bid

## 2016-07-15 ENCOUNTER — Ambulatory Visit (HOSPITAL_BASED_OUTPATIENT_CLINIC_OR_DEPARTMENT_OTHER): Payer: BLUE CROSS/BLUE SHIELD | Admitting: Oncology

## 2016-07-15 ENCOUNTER — Ambulatory Visit (HOSPITAL_BASED_OUTPATIENT_CLINIC_OR_DEPARTMENT_OTHER): Payer: BLUE CROSS/BLUE SHIELD

## 2016-07-15 ENCOUNTER — Other Ambulatory Visit: Payer: Self-pay | Admitting: *Deleted

## 2016-07-15 ENCOUNTER — Telehealth: Payer: Self-pay | Admitting: *Deleted

## 2016-07-15 ENCOUNTER — Other Ambulatory Visit (HOSPITAL_BASED_OUTPATIENT_CLINIC_OR_DEPARTMENT_OTHER): Payer: BLUE CROSS/BLUE SHIELD

## 2016-07-15 ENCOUNTER — Encounter: Payer: Self-pay | Admitting: Oncology

## 2016-07-15 ENCOUNTER — Telehealth: Payer: Self-pay | Admitting: Oncology

## 2016-07-15 VITALS — BP 95/61 | HR 113 | Temp 98.2°F | Resp 18 | Ht 71.0 in | Wt 185.9 lb

## 2016-07-15 DIAGNOSIS — C154 Malignant neoplasm of middle third of esophagus: Secondary | ICD-10-CM | POA: Diagnosis not present

## 2016-07-15 DIAGNOSIS — C7951 Secondary malignant neoplasm of bone: Secondary | ICD-10-CM

## 2016-07-15 DIAGNOSIS — C771 Secondary and unspecified malignant neoplasm of intrathoracic lymph nodes: Secondary | ICD-10-CM

## 2016-07-15 DIAGNOSIS — R110 Nausea: Secondary | ICD-10-CM | POA: Diagnosis not present

## 2016-07-15 DIAGNOSIS — Z5111 Encounter for antineoplastic chemotherapy: Secondary | ICD-10-CM | POA: Diagnosis not present

## 2016-07-15 DIAGNOSIS — C7801 Secondary malignant neoplasm of right lung: Secondary | ICD-10-CM

## 2016-07-15 DIAGNOSIS — Z720 Tobacco use: Secondary | ICD-10-CM

## 2016-07-15 LAB — COMPREHENSIVE METABOLIC PANEL
ALT: 44 U/L (ref 0–55)
AST: 44 U/L — AB (ref 5–34)
Albumin: 3.2 g/dL — ABNORMAL LOW (ref 3.5–5.0)
Alkaline Phosphatase: 164 U/L — ABNORMAL HIGH (ref 40–150)
Anion Gap: 9 mEq/L (ref 3–11)
BILIRUBIN TOTAL: 0.43 mg/dL (ref 0.20–1.20)
BUN: 10.9 mg/dL (ref 7.0–26.0)
CO2: 27 meq/L (ref 22–29)
Calcium: 9.8 mg/dL (ref 8.4–10.4)
Chloride: 107 mEq/L (ref 98–109)
Creatinine: 0.9 mg/dL (ref 0.7–1.3)
EGFR: 88 mL/min/{1.73_m2} — AB (ref >90)
GLUCOSE: 101 mg/dL (ref 70–140)
Potassium: 3.5 mEq/L (ref 3.5–5.1)
SODIUM: 143 meq/L (ref 136–145)
TOTAL PROTEIN: 6.4 g/dL (ref 6.4–8.3)

## 2016-07-15 LAB — CBC WITH DIFFERENTIAL/PLATELET
BASO%: 0.2 % (ref 0.0–2.0)
BASOS ABS: 0 10*3/uL (ref 0.0–0.1)
EOS%: 0.7 % (ref 0.0–7.0)
Eosinophils Absolute: 0 10*3/uL (ref 0.0–0.5)
HCT: 36.2 % — ABNORMAL LOW (ref 38.4–49.9)
HGB: 12.5 g/dL — ABNORMAL LOW (ref 13.0–17.1)
LYMPH#: 1.3 10*3/uL (ref 0.9–3.3)
LYMPH%: 28.6 % (ref 14.0–49.0)
MCH: 30.5 pg (ref 27.2–33.4)
MCHC: 34.5 g/dL (ref 32.0–36.0)
MCV: 88.3 fL (ref 79.3–98.0)
MONO#: 0.9 10*3/uL (ref 0.1–0.9)
MONO%: 19.2 % — AB (ref 0.0–14.0)
NEUT%: 51.3 % (ref 39.0–75.0)
NEUTROS ABS: 2.3 10*3/uL (ref 1.5–6.5)
Platelets: 237 10*3/uL (ref 140–400)
RBC: 4.1 10*6/uL — ABNORMAL LOW (ref 4.20–5.82)
RDW: 14.9 % — AB (ref 11.0–14.6)
WBC: 4.5 10*3/uL (ref 4.0–10.3)
nRBC: 0 % (ref 0–0)

## 2016-07-15 MED ORDER — DEXTROSE 5 % IV SOLN
85.0000 mg/m2 | Freq: Once | INTRAVENOUS | Status: AC
  Administered 2016-07-15: 180 mg via INTRAVENOUS
  Filled 2016-07-15: qty 36

## 2016-07-15 MED ORDER — PALONOSETRON HCL INJECTION 0.25 MG/5ML
0.2500 mg | Freq: Once | INTRAVENOUS | Status: AC
  Administered 2016-07-15: 0.25 mg via INTRAVENOUS

## 2016-07-15 MED ORDER — SODIUM CHLORIDE 0.9% FLUSH
10.0000 mL | INTRAVENOUS | Status: DC | PRN
  Filled 2016-07-15: qty 10

## 2016-07-15 MED ORDER — DEXTROSE 5 % IV SOLN
Freq: Once | INTRAVENOUS | Status: AC
  Administered 2016-07-15: 15:00:00 via INTRAVENOUS

## 2016-07-15 MED ORDER — DEXTROSE 5 % IV SOLN
400.0000 mg/m2 | Freq: Once | INTRAVENOUS | Status: AC
  Administered 2016-07-15: 848 mg via INTRAVENOUS
  Filled 2016-07-15: qty 42.4

## 2016-07-15 MED ORDER — FLUOROURACIL CHEMO INJECTION 2.5 GM/50ML
400.0000 mg/m2 | Freq: Once | INTRAVENOUS | Status: AC
  Administered 2016-07-15: 850 mg via INTRAVENOUS
  Filled 2016-07-15: qty 17

## 2016-07-15 MED ORDER — SODIUM CHLORIDE 0.9 % IV SOLN
Freq: Once | INTRAVENOUS | Status: AC
  Administered 2016-07-15: 15:00:00 via INTRAVENOUS
  Filled 2016-07-15: qty 5

## 2016-07-15 MED ORDER — PALONOSETRON HCL INJECTION 0.25 MG/5ML
INTRAVENOUS | Status: AC
  Filled 2016-07-15: qty 5

## 2016-07-15 MED ORDER — SODIUM CHLORIDE 0.9 % IV SOLN
2400.0000 mg/m2 | INTRAVENOUS | Status: DC
  Administered 2016-07-15: 5100 mg via INTRAVENOUS
  Filled 2016-07-15: qty 102

## 2016-07-15 NOTE — Telephone Encounter (Signed)
Message received from patient's wife, stating that they do not have schedule for today's appts that she was told by Dr. Benay Spice would be in for today.  Message sent to scheduling on 07/08/16 by Dr. Benay Spice, but schedule not complete. New LOS sent to scheduling and call placed back to patient's wife to inform her to be at [facility] for 11:30AM for lab/flush/K. Curcio NP at noon and FOLFOX.  Patient's wife appreciative of call and will be here at 11:30AM.

## 2016-07-15 NOTE — Progress Notes (Signed)
[place] OFFICE PROGRESS NOTE   Diagnosis: Esophagus cancer  INTERVAL HISTORY:   Billy Romero returns as scheduled. He completed a cycle of FOLFOX beginning 06/24/2016. The patient was due for second cycle of chemotherapy last week, but this was held due to orthostasis. Cortisol levels were checked which were borderline low. He was started on hydrocortisone 20 mg twice a day. Patient states that he feels much better today. He is no longer dizzy and has not had any falls. He has been able to work in the yard and take a shower which he was able to do previously. He has had mild nausea following his first cycle of chemotherapy. Compazine did not work for him and he was switched to Phenergan which has improved his nausea. He has noted improvement in dysphagia. Good appetite. He is drinking adequate fluids. Denies constipation or diarrhea. The diplopia continues to improve. The left-sided chest discomfort has improved.  Objective:  Vital signs in last 24 hours:  Blood pressure 95/61, pulse (!) 113, temperature 98.2 F (36.8 C), temperature source Oral, resp. rate 18, height 5' 11" (1.803 m), weight 185 lb 14.4 oz (84.3 kg), SpO2 100 %.    HEENT: The mucous membranes are moist, no thrush or ulcers Resp: Lungs clear bilaterally, no respiratory distress Cardio: Regular rhythm, tachycardia GI: No hepatomegaly, nontender Vascular: No leg edema Neuro: Alert and oriented   Portacath/PICC-without erythema  Lab Results:  Lab Results  Component Value Date   WBC 4.5 07/15/2016   HGB 12.5 (L) 07/15/2016   HCT 36.2 (L) 07/15/2016   MCV 88.3 07/15/2016   PLT 237 07/15/2016   NEUTROABS 2.3 07/15/2016   Potassium 3.5, creatinine 0.9, BUN 10.9, calcium 9.8, albumin 3.2   Medications: I have reviewed the patient's current medications.  Assessment/Plan: 1. 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 Radiation completed 06/10/2014  PET scan 07/01/2014 revealed improvement in the hypermetabolic esophagus mass and mediastinal lymphadenopathy  upper endoscopy 04/20/2015 -mid esophagus with mild narrowing , 2 small nodules measuring approximate 5 mm -biopsy with scant and detached fragments with high-grade squamous dysplasia , brushings negative for malignant cells.  CT chest/abdomen/pelvis 05/25/2015 with development of bilateral pulmonary nodules; paratracheal and subcarinal nodes improved/similar; new small lower paraesophageal nodes which are indeterminate; esophageal wall thickening improved since the prior PET; no evidence of metastatic disease in the abdomen or pelvis.  PET scan 06/16/2015 revealed hypermetabolic lung nodules, hypermetabolic mediastinal nodes, hypermetabolism at the mid to distal esophagus has improved  bronchoscopy/EBUS on 07/04/2015 confirmed squamous cell carcinoma involving right lower lobe brushings, right bronchial washings, a level 10 R lymph node biopsy, and right lower lobe biopsy  PD-L1 no expression on the right lower lobe biopsy (less than 1%)  Cycle 1 Pembrolizumab 07/26/2015  Cycle 2 Pembrolizumab 08/16/2015  Cycle 3 Pembrolizumab 09/06/2015  Cycle 4 Pembrolizumab 09/27/2015  CT chest 10/11/2015-stable/decreased size of pulmonary nodules, enlargement of several mediastinal nodes  Cycle 5 Pembrolizumab 10/18/2015  Cycle 6 Pembrolizumab 11/08/2015   Cycle 7 Pembrolizumab08/05/2015  Cycle 8 Pembrolizumab08/23/2017  CT chest 01/15/2016-slight progression of mediastinal/upper abdominal lymphadenopathy and enlargement of a right lower lobe nodule  Cycle 9 Pembrolizumab 01/17/2016   Cycle  10 Pembrolizumab10/02/2016  Cycle 11 Pembrolizumab 02/28/2016  Cycle 12  Pembrolizumab 03/20/2016  CT chest 04/25/2016-minimal progression of chest lymphadenopathy and lung nodules, thickening at the mid thoracic esophagus  MRI brain 04/30/2016-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 Solid/liquid dysphagia secondary to #1  Jejunostomy feeding tube placement 04/07/2014  Feeding tube removed 05/30/2014.  3. Small bowel obstruction following placement of the jejunostomy feeding tube-resolved  4. History of ulcerative colitis  5. Aspiration pneumonia December 2015  6. 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 Fever/cough/dyspnea 04/30/2016-nasal swab positive for influenza A , chest x-ray with a left lower lobe Pneumonia -treated with Tamiflu and Levaquin   8. Admission 06/13/2016 with a cough/left-sided chest pain/dyspnea/tachycardia/low-grade fever-pulmonary embolism confirmed on a chest CT, progressive chest adenopathy, inflammatory changes versus lymphatic tumor spread in the right lower lobe. Maintained on Xarelto.  9. Port-A-Cath placement 06/11/2016  10. Dysphagia. Barium swallow 05/01/2016 with circumferential narrowing of the midesophagus causing moderate degree of stricture which would not allow the 13 mm barium tablet to pass. The dysphagia improved spontaneously. He developed recurrent dysphagia to solids 06/15/2016. He is tolerating liquids.  11.  Recurrent  syncope events-borderline low cortisol level III/XII/MMXVIII, symptoms improved with hydrocortisone replacement  12.  Hypercalcemia of malignancy-improved   Billy Romero completed one cycle of FOLFOX 06/24/2016. He tolerated the chemotherapy well other than delayed nausea. Cycle 2 was held last week secondary to the recurrent syncope events. He was started on hydrocortisone 20 mg twice a day for borderline low cortisol level with significant improvement in his dizziness. He will continue the hydrocortisone.  He will proceed with cycle 2 of his FOLFOX today as scheduled. Plan is for 4-5 cycles of chemotherapy and then a restaging scan to assess response to treatment.  He will use his Phenergan as needed for nausea.  Billy Romero will return in 2 weeks for a visit and his third cycle of chemotherapy.  He will call for any questions or concerns prior to his next visit.  Patient was seen with Dr. Benay Spice.  Mikey Bussing, NP  07/15/2016  1:07 PM This was a shared visit with Mikey Bussing Billy Romero has experienced significant improvement in the orthostasis symptoms with hydrocortisone replacement. The plan is to proceed with cycle 2 FOLFOX today.  Julieanne Manson, M.D.

## 2016-07-15 NOTE — Telephone Encounter (Signed)
Gave patient AVS and calender per 07/15/2016 los.

## 2016-07-15 NOTE — Patient Instructions (Signed)
[facility] Discharge Instructions for Patients Receiving Chemotherapy  Today you received the following chemotherapy agents: Oxaliplatin and Leucovorin  To help prevent nausea and vomiting after your treatment, we encourage you to take your nausea medication as directed.   If you develop nausea and vomiting that is not controlled by your nausea medication, call the clinic.   BELOW ARE SYMPTOMS THAT SHOULD BE REPORTED IMMEDIATELY:  *FEVER GREATER THAN 100.5 F  *CHILLS WITH OR WITHOUT FEVER  NAUSEA AND VOMITING THAT IS NOT CONTROLLED WITH YOUR NAUSEA MEDICATION  *UNUSUAL SHORTNESS OF BREATH  *UNUSUAL BRUISING OR BLEEDING  TENDERNESS IN MOUTH AND THROAT WITH OR WITHOUT PRESENCE OF ULCERS  *URINARY PROBLEMS  *BOWEL PROBLEMS  UNUSUAL RASH Items with * indicate a potential emergency and should be followed up as soon as possible.  Feel free to call the clinic you have any questions or concerns. The clinic phone number is [phone].  Please show the [facility] at check-in to the Emergency Department and triage nurse.

## 2016-07-17 ENCOUNTER — Ambulatory Visit (HOSPITAL_BASED_OUTPATIENT_CLINIC_OR_DEPARTMENT_OTHER): Payer: BLUE CROSS/BLUE SHIELD

## 2016-07-17 VITALS — BP 98/61 | HR 81 | Temp 97.6°F | Resp 18

## 2016-07-17 DIAGNOSIS — C771 Secondary and unspecified malignant neoplasm of intrathoracic lymph nodes: Secondary | ICD-10-CM | POA: Diagnosis not present

## 2016-07-17 DIAGNOSIS — Z452 Encounter for adjustment and management of vascular access device: Secondary | ICD-10-CM

## 2016-07-17 DIAGNOSIS — C154 Malignant neoplasm of middle third of esophagus: Secondary | ICD-10-CM | POA: Diagnosis not present

## 2016-07-17 DIAGNOSIS — C7801 Secondary malignant neoplasm of right lung: Secondary | ICD-10-CM | POA: Diagnosis not present

## 2016-07-17 MED ORDER — HEPARIN SOD (PORK) LOCK FLUSH 100 UNIT/ML IV SOLN
500.0000 [IU] | Freq: Once | INTRAVENOUS | Status: AC | PRN
  Administered 2016-07-17: 500 [IU]
  Filled 2016-07-17: qty 5

## 2016-07-17 MED ORDER — SODIUM CHLORIDE 0.9% FLUSH
10.0000 mL | INTRAVENOUS | Status: DC | PRN
  Administered 2016-07-17: 10 mL
  Filled 2016-07-17: qty 10

## 2016-07-17 NOTE — Progress Notes (Signed)
1545: 5cc remaining in pump. 5cc bolus given, Bag registering empty at disconnect. Pt tolerated well. Pt stable at discharge.

## 2016-07-28 ENCOUNTER — Other Ambulatory Visit: Payer: Self-pay | Admitting: Oncology

## 2016-07-29 ENCOUNTER — Ambulatory Visit (HOSPITAL_BASED_OUTPATIENT_CLINIC_OR_DEPARTMENT_OTHER): Payer: BLUE CROSS/BLUE SHIELD

## 2016-07-29 ENCOUNTER — Ambulatory Visit: Payer: BLUE CROSS/BLUE SHIELD | Admitting: Nutrition

## 2016-07-29 ENCOUNTER — Ambulatory Visit (HOSPITAL_BASED_OUTPATIENT_CLINIC_OR_DEPARTMENT_OTHER): Payer: BLUE CROSS/BLUE SHIELD | Admitting: Oncology

## 2016-07-29 ENCOUNTER — Ambulatory Visit: Payer: BLUE CROSS/BLUE SHIELD

## 2016-07-29 ENCOUNTER — Other Ambulatory Visit (HOSPITAL_BASED_OUTPATIENT_CLINIC_OR_DEPARTMENT_OTHER): Payer: BLUE CROSS/BLUE SHIELD

## 2016-07-29 ENCOUNTER — Other Ambulatory Visit: Payer: Self-pay | Admitting: *Deleted

## 2016-07-29 ENCOUNTER — Encounter: Payer: Self-pay | Admitting: Oncology

## 2016-07-29 ENCOUNTER — Telehealth: Payer: Self-pay | Admitting: Oncology

## 2016-07-29 VITALS — BP 97/72 | HR 100 | Resp 18 | Wt 183.9 lb

## 2016-07-29 DIAGNOSIS — I2699 Other pulmonary embolism without acute cor pulmonale: Secondary | ICD-10-CM

## 2016-07-29 DIAGNOSIS — C154 Malignant neoplasm of middle third of esophagus: Secondary | ICD-10-CM

## 2016-07-29 DIAGNOSIS — C7801 Secondary malignant neoplasm of right lung: Secondary | ICD-10-CM

## 2016-07-29 DIAGNOSIS — C771 Secondary and unspecified malignant neoplasm of intrathoracic lymph nodes: Secondary | ICD-10-CM | POA: Diagnosis not present

## 2016-07-29 DIAGNOSIS — Z5111 Encounter for antineoplastic chemotherapy: Secondary | ICD-10-CM

## 2016-07-29 DIAGNOSIS — H532 Diplopia: Secondary | ICD-10-CM

## 2016-07-29 DIAGNOSIS — R110 Nausea: Secondary | ICD-10-CM

## 2016-07-29 DIAGNOSIS — R079 Chest pain, unspecified: Secondary | ICD-10-CM

## 2016-07-29 DIAGNOSIS — Z95828 Presence of other vascular implants and grafts: Secondary | ICD-10-CM

## 2016-07-29 LAB — CBC WITH DIFFERENTIAL/PLATELET
BASO%: 0.5 % (ref 0.0–2.0)
Basophils Absolute: 0 10*3/uL (ref 0.0–0.1)
EOS%: 0.3 % (ref 0.0–7.0)
Eosinophils Absolute: 0 10*3/uL (ref 0.0–0.5)
HEMATOCRIT: 35.9 % — AB (ref 38.4–49.9)
HGB: 12.1 g/dL — ABNORMAL LOW (ref 13.0–17.1)
LYMPH#: 0.9 10*3/uL (ref 0.9–3.3)
LYMPH%: 20.8 % (ref 14.0–49.0)
MCH: 30.6 pg (ref 27.2–33.4)
MCHC: 33.8 g/dL (ref 32.0–36.0)
MCV: 90.7 fL (ref 79.3–98.0)
MONO#: 0.3 10*3/uL (ref 0.1–0.9)
MONO%: 7.8 % (ref 0.0–14.0)
NEUT#: 2.9 10*3/uL (ref 1.5–6.5)
NEUT%: 70.6 % (ref 39.0–75.0)
Platelets: 162 10*3/uL (ref 140–400)
RBC: 3.96 10*6/uL — AB (ref 4.20–5.82)
RDW: 15.9 % — ABNORMAL HIGH (ref 11.0–14.6)
WBC: 4.1 10*3/uL (ref 4.0–10.3)

## 2016-07-29 LAB — COMPREHENSIVE METABOLIC PANEL
ALT: 39 U/L (ref 0–55)
AST: 24 U/L (ref 5–34)
Albumin: 3.3 g/dL — ABNORMAL LOW (ref 3.5–5.0)
Alkaline Phosphatase: 192 U/L — ABNORMAL HIGH (ref 40–150)
Anion Gap: 8 mEq/L (ref 3–11)
BILIRUBIN TOTAL: 0.34 mg/dL (ref 0.20–1.20)
BUN: 14.9 mg/dL (ref 7.0–26.0)
CHLORIDE: 109 meq/L (ref 98–109)
CO2: 25 meq/L (ref 22–29)
CREATININE: 0.8 mg/dL (ref 0.7–1.3)
Calcium: 9.6 mg/dL (ref 8.4–10.4)
EGFR: >90 mL/min/{1.73_m2} (ref >90)
GLUCOSE: 115 mg/dL (ref 70–140)
POTASSIUM: 4 meq/L (ref 3.5–5.1)
SODIUM: 142 meq/L (ref 136–145)
Total Protein: 6 g/dL — ABNORMAL LOW (ref 6.4–8.3)

## 2016-07-29 MED ORDER — RIVAROXABAN 20 MG PO TABS: 20.0000 mg | ORAL_TABLET | Freq: Every day | ORAL | 1 refills | Status: DC

## 2016-07-29 MED ORDER — SODIUM CHLORIDE 0.9 % IV SOLN
Freq: Once | INTRAVENOUS | Status: AC
  Administered 2016-07-29: 10:00:00 via INTRAVENOUS
  Filled 2016-07-29: qty 5

## 2016-07-29 MED ORDER — PALONOSETRON HCL INJECTION 0.25 MG/5ML
INTRAVENOUS | Status: AC
  Filled 2016-07-29: qty 5

## 2016-07-29 MED ORDER — FLUOROURACIL CHEMO INJECTION 2.5 GM/50ML
400.0000 mg/m2 | Freq: Once | INTRAVENOUS | Status: AC
  Administered 2016-07-29: 850 mg via INTRAVENOUS
  Filled 2016-07-29: qty 17

## 2016-07-29 MED ORDER — SODIUM CHLORIDE 0.9% FLUSH
10.0000 mL | INTRAVENOUS | Status: DC | PRN
  Administered 2016-07-29: 10 mL via INTRAVENOUS
  Filled 2016-07-29: qty 10

## 2016-07-29 MED ORDER — DEXTROSE 5 % IV SOLN
Freq: Once | INTRAVENOUS | Status: AC
  Administered 2016-07-29: 10:00:00 via INTRAVENOUS

## 2016-07-29 MED ORDER — SODIUM CHLORIDE 0.9 % IV SOLN
2400.0000 mg/m2 | INTRAVENOUS | Status: DC
  Administered 2016-07-29: 5100 mg via INTRAVENOUS
  Filled 2016-07-29: qty 102

## 2016-07-29 MED ORDER — OXALIPLATIN CHEMO INJECTION 100 MG/20ML
85.0000 mg/m2 | Freq: Once | INTRAVENOUS | Status: AC
  Administered 2016-07-29: 180 mg via INTRAVENOUS
  Filled 2016-07-29: qty 36

## 2016-07-29 MED ORDER — PROMETHAZINE HCL 25 MG PO TABS: 25.0000 mg | ORAL_TABLET | Freq: Four times a day (QID) | ORAL | 0 refills | Status: DC | PRN

## 2016-07-29 MED ORDER — LEUCOVORIN CALCIUM INJECTION 350 MG
400.0000 mg/m2 | Freq: Once | INTRAVENOUS | Status: AC
  Administered 2016-07-29: 848 mg via INTRAVENOUS
  Filled 2016-07-29: qty 42.4

## 2016-07-29 MED ORDER — PALONOSETRON HCL INJECTION 0.25 MG/5ML
0.2500 mg | Freq: Once | INTRAVENOUS | Status: AC
  Administered 2016-07-29: 0.25 mg via INTRAVENOUS

## 2016-07-29 NOTE — Patient Instructions (Signed)
Implanted Port Home Guide An implanted port is a type of central line that is placed under the skin. Central lines are used to provide IV access when treatment or nutrition needs to be given through a person's veins. Implanted ports are used for long-term IV access. An implanted port may be placed because:  You need IV medicine that would be irritating to the small veins in your hands or arms.  You need long-term IV medicines, such as antibiotics.  You need IV nutrition for a long period.  You need frequent blood draws for lab tests.  You need dialysis.  Implanted ports are usually placed in the chest area, but they can also be placed in the upper arm, the abdomen, or the leg. An implanted port has two main parts:  Reservoir. The reservoir is round and will appear as a small, raised area under your skin. The reservoir is the part where a needle is inserted to give medicines or draw blood.  Catheter. The catheter is a thin, flexible tube that extends from the reservoir. The catheter is placed into a large vein. Medicine that is inserted into the reservoir goes into the catheter and then into the vein.  How will I care for my incision site? Do not get the incision site wet. Bathe or shower as directed by your health care provider. How is my port accessed? Special steps must be taken to access the port:  Before the port is accessed, a numbing cream can be placed on the skin. This helps numb the skin over the port site.  Your health care provider uses a sterile technique to access the port. ? Your health care provider must put on a mask and sterile gloves. ? The skin over your port is cleaned carefully with an antiseptic and allowed to dry. ? The port is gently pinched between sterile gloves, and a needle is inserted into the port.  Only "non-coring" port needles should be used to access the port. Once the port is accessed, a blood return should be checked. This helps ensure that the port  is in the vein and is not clogged.  If your port needs to remain accessed for a constant infusion, a clear (transparent) bandage will be placed over the needle site. The bandage and needle will need to be changed every week, or as directed by your health care provider.  Keep the bandage covering the needle clean and dry. Do not get it wet. Follow your health care provider's instructions on how to take a shower or bath while the port is accessed.  If your port does not need to stay accessed, no bandage is needed over the port.  What is flushing? Flushing helps keep the port from getting clogged. Follow your health care provider's instructions on how and when to flush the port. Ports are usually flushed with saline solution or a medicine called heparin. The need for flushing will depend on how the port is used.  If the port is used for intermittent medicines or blood draws, the port will need to be flushed: ? After medicines have been given. ? After blood has been drawn. ? As part of routine maintenance.  If a constant infusion is running, the port may not need to be flushed.  How long will my port stay implanted? The port can stay in for as long as your health care provider thinks it is needed. When it is time for the port to come out, surgery will be   done to remove it. The procedure is similar to the one performed when the port was put in. When should I seek immediate medical care? When you have an implanted port, you should seek immediate medical care if:  You notice a bad smell coming from the incision site.  You have swelling, redness, or drainage at the incision site.  You have more swelling or pain at the port site or the surrounding area.  You have a fever that is not controlled with medicine.  This information is not intended to replace advice given to you by your health care provider. Make sure you discuss any questions you have with your health care provider. Document  Released: 04/15/2005 Document Revised: 09/21/2015 Document Reviewed: 12/21/2012 Elsevier Interactive Patient Education  2017 [employer]  

## 2016-07-29 NOTE — Progress Notes (Signed)
Nutrition follow-up completed with patient receiving chemotherapy for obstructive esophageal cancer. Patient reports his dysphagia has improved. He had mild nausea after chemotherapy which is improved by taking Phenergan. Patient reports he thinks his oral intake has improved. He is consuming 1 protein shake in the morning. Weight decreased and documented as 183.9 pounds April 2, down from 186.2 pounds. Noted albumin 3.3.  Nutrition diagnosis: Unintended weight loss continues.  Intervention: Educated patient to continue to strive to increase oral intake to minimize further weight loss. Encouraged patient to consider increasing oral nutrition supplements after chemotherapy. Questions were answered.  Teach back method used.  Monitoring, evaluation, goals: Patient will work to increase oral intake from minimal weight loss.  Next visit: To be scheduled with upcoming treatments.  **Disclaimer: This note was dictated with voice recognition software. Similar sounding words can inadvertently be transcribed and this note may contain transcription errors which may not have been corrected upon publication of note.**

## 2016-07-29 NOTE — Telephone Encounter (Signed)
Gave patient AVS and calender per 07/29/2016 los.

## 2016-07-29 NOTE — Progress Notes (Signed)
[place] OFFICE PROGRESS NOTE   Diagnosis: Esophagus cancer  INTERVAL HISTORY:   Billy Romero returns as scheduled. He completed a cycle of FOLFOX beginning 07/15/2016. The patient states that he feels much better today. He is no longer dizzy and has not had any falls. He has been able to work in the yard. Phenergan is working much better for his nausea and he request a refill on this today.  He has noted improvement in dysphagia. Good appetite. He is drinking adequate fluids. Denies constipation or diarrhea. The diplopia continues to improve. The left-sided chest discomfort has improved. Requests a refill on Xarelto today.  Objective:  Vital signs in last 24 hours:  Blood pressure 97/72, pulse 100, resp. rate 18, weight 183 lb 14.4 oz (83.4 kg), SpO2 100 %.    HEENT: The mucous membranes are moist, no thrush or ulcers Resp: Lungs clear bilaterally, no respiratory distress Cardio: Regular rhythm, tachycardia GI: No hepatomegaly, nontender Vascular: No leg edema Neuro: Alert and oriented   Portacath/PICC-without erythema  Lab Results:  Lab Results  Component Value Date   WBC 4.1 07/29/2016   HGB 12.1 (L) 07/29/2016   HCT 35.9 (L) 07/29/2016   MCV 90.7 07/29/2016   PLT 162 07/29/2016   NEUTROABS 2.9 07/29/2016   Potassium 4.0, creatinine 0.8, BUN 14.9, calcium 9.6, albumin 3.3   Medications: I have reviewed the patient's current medications.  Assessment/Plan: 1. 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 Radiation completed 06/10/2014  PET scan 07/01/2014 revealed improvement in the hypermetabolic esophagus mass and mediastinal  lymphadenopathy  upper endoscopy 04/20/2015 -mid esophagus with mild narrowing , 2 small nodules measuring approximate 5 mm -biopsy with scant and detached fragments with high-grade squamous dysplasia , brushings negative for malignant cells.  CT chest/abdomen/pelvis 05/25/2015 with development of bilateral pulmonary nodules; paratracheal and subcarinal nodes improved/similar; new small lower paraesophageal nodes which are indeterminate; esophageal wall thickening improved since the prior PET; no evidence of metastatic disease in the abdomen or pelvis.  PET scan 06/16/2015 revealed hypermetabolic lung nodules, hypermetabolic mediastinal nodes, hypermetabolism at the mid to distal esophagus has improved  bronchoscopy/EBUS on 07/04/2015 confirmed squamous cell carcinoma involving right lower lobe brushings, right bronchial washings, a level 10 R lymph node biopsy, and right lower lobe biopsy  PD-L1 no expression on the right lower lobe biopsy (less than 1%)  Cycle 1 Pembrolizumab 07/26/2015  Cycle 2 Pembrolizumab 08/16/2015  Cycle 3 Pembrolizumab 09/06/2015  Cycle 4 Pembrolizumab 09/27/2015  CT chest 10/11/2015-stable/decreased size of pulmonary nodules, enlargement of several mediastinal nodes  Cycle 5 Pembrolizumab 10/18/2015  Cycle 6 Pembrolizumab 11/08/2015   Cycle 7 Pembrolizumab08/05/2015  Cycle 8 Pembrolizumab08/23/2017  CT chest 01/15/2016-slight progression of mediastinal/upper abdominal lymphadenopathy and enlargement of a right lower lobe nodule  Cycle 9 Pembrolizumab 01/17/2016   Cycle 10 Pembrolizumab10/02/2016  Cycle 11 Pembrolizumab 02/28/2016  Cycle 12 Pembrolizumab 03/20/2016  CT chest 04/25/2016-minimal progression of chest lymphadenopathy and lung nodules, thickening at the mid thoracic esophagus  MRI brain 04/30/2016-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 Solid/liquid dysphagia secondary to #1  Jejunostomy feeding tube placement 04/07/2014  Feeding tube removed 05/30/2014.  3. Small bowel obstruction following placement of the jejunostomy feeding tube-resolved  4. History of ulcerative colitis  5. Aspiration pneumonia December 2015  6. 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 Fever/cough/dyspnea 04/30/2016-nasal swab positive for influenza A , chest x-ray with a left lower lobe Pneumonia -treated with Tamiflu and Levaquin   8. Admission 06/13/2016 with a cough/left-sided chest pain/dyspnea/tachycardia/low-grade fever-pulmonary embolism confirmed on a chest CT, progressive chest adenopathy, inflammatory changes versus lymphatic tumor spread in the right lower lobe. Maintained on Xarelto.  9. Port-A-Cath placement 06/11/2016  10. Dysphagia. Barium swallow 05/01/2016 with circumferential narrowing of the midesophagus causing moderate degree of stricture which would not allow the 13 mm barium tablet to pass. The dysphagia improved spontaneously. He developed recurrent dysphagia to solids 06/15/2016. He is tolerating liquids.  11.  Recurrent syncope events-borderline low cortisol level III/XII/MMXVIII, symptoms improved with hydrocortisone replacement  12.  Hypercalcemia of malignancy-improved   Billy Romero completed his second cycle FOLFOX on 07/15/2016. He tolerated the chemotherapy well other than delayed nausea. The patient feels well today and his rate proceed with  his chemotherapy. The patient will receive cycle 3 of his chemotherapy today without dose modification. Plan is for 4-5 cycles of chemotherapy and then a restaging scan to assess response to treatment.  He will use his Phenergan as needed for nausea. Refill given today. I have also refilled his Xarelto.  Billy Romero will return in 2 weeks for a visit and his fourth cycle of chemotherapy.  He will call for any questions or concerns prior to his next visit.  Plan reviewed with Dr. Benay Spice.  Mikey Bussing, NP  07/29/2016  9:29 AM

## 2016-07-29 NOTE — Patient Instructions (Signed)
[facility] Discharge Instructions for Patients Receiving Chemotherapy  Today you received the following chemotherapy agents:  Oxaliplatin, Leucovorin, Fluorouracil  To help prevent nausea and vomiting after your treatment, we encourage you to take your nausea medication as prescribed.   If you develop nausea and vomiting that is not controlled by your nausea medication, call the clinic.   BELOW ARE SYMPTOMS THAT SHOULD BE REPORTED IMMEDIATELY:  *FEVER GREATER THAN 100.5 F  *CHILLS WITH OR WITHOUT FEVER  NAUSEA AND VOMITING THAT IS NOT CONTROLLED WITH YOUR NAUSEA MEDICATION  *UNUSUAL SHORTNESS OF BREATH  *UNUSUAL BRUISING OR BLEEDING  TENDERNESS IN MOUTH AND THROAT WITH OR WITHOUT PRESENCE OF ULCERS  *URINARY PROBLEMS  *BOWEL PROBLEMS  UNUSUAL RASH Items with * indicate a potential emergency and should be followed up as soon as possible.  Feel free to call the clinic you have any questions or concerns. The clinic phone number is [phone].  Please show the CHEMO ALERT CARD at check-in to the Emergency Department and triage nurse.   

## 2016-07-31 ENCOUNTER — Ambulatory Visit (HOSPITAL_BASED_OUTPATIENT_CLINIC_OR_DEPARTMENT_OTHER): Payer: BLUE CROSS/BLUE SHIELD

## 2016-07-31 VITALS — BP 110/66 | HR 99 | Temp 97.5°F | Resp 20

## 2016-07-31 DIAGNOSIS — C154 Malignant neoplasm of middle third of esophagus: Secondary | ICD-10-CM | POA: Diagnosis not present

## 2016-07-31 DIAGNOSIS — C7801 Secondary malignant neoplasm of right lung: Secondary | ICD-10-CM | POA: Diagnosis not present

## 2016-07-31 DIAGNOSIS — Z452 Encounter for adjustment and management of vascular access device: Secondary | ICD-10-CM | POA: Diagnosis not present

## 2016-07-31 DIAGNOSIS — C771 Secondary and unspecified malignant neoplasm of intrathoracic lymph nodes: Secondary | ICD-10-CM | POA: Diagnosis not present

## 2016-07-31 MED ORDER — HEPARIN SOD (PORK) LOCK FLUSH 100 UNIT/ML IV SOLN
500.0000 [IU] | Freq: Once | INTRAVENOUS | Status: AC | PRN
  Administered 2016-07-31: 500 [IU]
  Filled 2016-07-31: qty 5

## 2016-07-31 MED ORDER — SODIUM CHLORIDE 0.9% FLUSH
10.0000 mL | INTRAVENOUS | Status: DC | PRN
  Administered 2016-07-31: 10 mL
  Filled 2016-07-31: qty 10

## 2016-07-31 NOTE — Patient Instructions (Signed)
Implanted Port Home Guide An implanted port is a type of central line that is placed under the skin. Central lines are used to provide IV access when treatment or nutrition needs to be given through a person's veins. Implanted ports are used for long-term IV access. An implanted port may be placed because:  You need IV medicine that would be irritating to the small veins in your hands or arms.  You need long-term IV medicines, such as antibiotics.  You need IV nutrition for a long period.  You need frequent blood draws for lab tests.  You need dialysis.  Implanted ports are usually placed in the chest area, but they can also be placed in the upper arm, the abdomen, or the leg. An implanted port has two main parts:  Reservoir. The reservoir is round and will appear as a small, raised area under your skin. The reservoir is the part where a needle is inserted to give medicines or draw blood.  Catheter. The catheter is a thin, flexible tube that extends from the reservoir. The catheter is placed into a large vein. Medicine that is inserted into the reservoir goes into the catheter and then into the vein.  How will I care for my incision site? Do not get the incision site wet. Bathe or shower as directed by your health care provider. How is my port accessed? Special steps must be taken to access the port:  Before the port is accessed, a numbing cream can be placed on the skin. This helps numb the skin over the port site.  Your health care provider uses a sterile technique to access the port. ? Your health care provider must put on a mask and sterile gloves. ? The skin over your port is cleaned carefully with an antiseptic and allowed to dry. ? The port is gently pinched between sterile gloves, and a needle is inserted into the port.  Only "non-coring" port needles should be used to access the port. Once the port is accessed, a blood return should be checked. This helps ensure that the port  is in the vein and is not clogged.  If your port needs to remain accessed for a constant infusion, a clear (transparent) bandage will be placed over the needle site. The bandage and needle will need to be changed every week, or as directed by your health care provider.  Keep the bandage covering the needle clean and dry. Do not get it wet. Follow your health care provider's instructions on how to take a shower or bath while the port is accessed.  If your port does not need to stay accessed, no bandage is needed over the port.  What is flushing? Flushing helps keep the port from getting clogged. Follow your health care provider's instructions on how and when to flush the port. Ports are usually flushed with saline solution or a medicine called heparin. The need for flushing will depend on how the port is used.  If the port is used for intermittent medicines or blood draws, the port will need to be flushed: ? After medicines have been given. ? After blood has been drawn. ? As part of routine maintenance.  If a constant infusion is running, the port may not need to be flushed.  How long will my port stay implanted? The port can stay in for as long as your health care provider thinks it is needed. When it is time for the port to come out, surgery will be   done to remove it. The procedure is similar to the one performed when the port was put in. When should I seek immediate medical care? When you have an implanted port, you should seek immediate medical care if:  You notice a bad smell coming from the incision site.  You have swelling, redness, or drainage at the incision site.  You have more swelling or pain at the port site or the surrounding area.  You have a fever that is not controlled with medicine.  This information is not intended to replace advice given to you by your health care provider. Make sure you discuss any questions you have with your health care provider. Document  Released: 04/15/2005 Document Revised: 09/21/2015 Document Reviewed: 12/21/2012 Elsevier Interactive Patient Education  2017 [employer]  

## 2016-08-05 NOTE — Telephone Encounter (Signed)
error 

## 2016-08-08 ENCOUNTER — Telehealth: Payer: Self-pay | Admitting: *Deleted

## 2016-08-08 MED ORDER — HYDROCORTISONE 20 MG PO TABS: 20.0000 mg | ORAL_TABLET | Freq: Two times a day (BID) | ORAL | 0 refills | Status: AC

## 2016-08-08 NOTE — Telephone Encounter (Addendum)
Message from pt's wife asking if he should continue Cortef 20 mg. He will take his last dose on 4/13. Will review with MD.  Order received and e-scribed.

## 2016-08-11 ENCOUNTER — Other Ambulatory Visit: Payer: Self-pay | Admitting: Oncology

## 2016-08-12 ENCOUNTER — Ambulatory Visit (HOSPITAL_BASED_OUTPATIENT_CLINIC_OR_DEPARTMENT_OTHER): Payer: BLUE CROSS/BLUE SHIELD | Admitting: Oncology

## 2016-08-12 ENCOUNTER — Other Ambulatory Visit: Payer: Self-pay | Admitting: *Deleted

## 2016-08-12 ENCOUNTER — Ambulatory Visit (HOSPITAL_BASED_OUTPATIENT_CLINIC_OR_DEPARTMENT_OTHER): Payer: BLUE CROSS/BLUE SHIELD

## 2016-08-12 ENCOUNTER — Ambulatory Visit: Payer: BLUE CROSS/BLUE SHIELD

## 2016-08-12 ENCOUNTER — Other Ambulatory Visit (HOSPITAL_BASED_OUTPATIENT_CLINIC_OR_DEPARTMENT_OTHER): Payer: BLUE CROSS/BLUE SHIELD

## 2016-08-12 VITALS — BP 100/62 | HR 92 | Temp 98.0°F | Resp 18 | Ht 71.0 in | Wt 187.5 lb

## 2016-08-12 DIAGNOSIS — C154 Malignant neoplasm of middle third of esophagus: Secondary | ICD-10-CM

## 2016-08-12 DIAGNOSIS — C7801 Secondary malignant neoplasm of right lung: Secondary | ICD-10-CM

## 2016-08-12 DIAGNOSIS — C771 Secondary and unspecified malignant neoplasm of intrathoracic lymph nodes: Secondary | ICD-10-CM

## 2016-08-12 DIAGNOSIS — C184 Malignant neoplasm of transverse colon: Secondary | ICD-10-CM

## 2016-08-12 DIAGNOSIS — Z95828 Presence of other vascular implants and grafts: Secondary | ICD-10-CM

## 2016-08-12 LAB — COMPREHENSIVE METABOLIC PANEL
ALT: 42 U/L (ref 0–55)
ANION GAP: 9 meq/L (ref 3–11)
AST: 24 U/L (ref 5–34)
Albumin: 3.2 g/dL — ABNORMAL LOW (ref 3.5–5.0)
Alkaline Phosphatase: 166 U/L — ABNORMAL HIGH (ref 40–150)
BUN: 10.9 mg/dL (ref 7.0–26.0)
CALCIUM: 9.3 mg/dL (ref 8.4–10.4)
CHLORIDE: 109 meq/L (ref 98–109)
CO2: 27 mEq/L (ref 22–29)
Creatinine: 0.9 mg/dL (ref 0.7–1.3)
EGFR: >90 mL/min/{1.73_m2} (ref >90)
Glucose: 147 mg/dl — ABNORMAL HIGH (ref 70–140)
POTASSIUM: 3.4 meq/L — AB (ref 3.5–5.1)
Sodium: 146 mEq/L — ABNORMAL HIGH (ref 136–145)
Total Bilirubin: 0.47 mg/dL (ref 0.20–1.20)
Total Protein: 5.8 g/dL — ABNORMAL LOW (ref 6.4–8.3)

## 2016-08-12 LAB — CBC WITH DIFFERENTIAL/PLATELET
BASO%: 0.6 % (ref 0.0–2.0)
Basophils Absolute: 0 10*3/uL (ref 0.0–0.1)
EOS%: 0.2 % (ref 0.0–7.0)
Eosinophils Absolute: 0 10*3/uL (ref 0.0–0.5)
HCT: 32.3 % — ABNORMAL LOW (ref 38.4–49.9)
HGB: 11.4 g/dL — ABNORMAL LOW (ref 13.0–17.1)
LYMPH%: 27.9 % (ref 14.0–49.0)
MCH: 32.7 pg (ref 27.2–33.4)
MCHC: 35.3 g/dL (ref 32.0–36.0)
MCV: 92.6 fL (ref 79.3–98.0)
MONO#: 0.4 10*3/uL (ref 0.1–0.9)
MONO%: 11.6 % (ref 0.0–14.0)
NEUT%: 59.7 % (ref 39.0–75.0)
NEUTROS ABS: 2 10*3/uL (ref 1.5–6.5)
Platelets: 132 10*3/uL — ABNORMAL LOW (ref 140–400)
RBC: 3.49 10*6/uL — AB (ref 4.20–5.82)
RDW: 19.4 % — ABNORMAL HIGH (ref 11.0–14.6)
WBC: 3.3 10*3/uL — AB (ref 4.0–10.3)
lymph#: 0.9 10*3/uL (ref 0.9–3.3)

## 2016-08-12 MED ORDER — DEXTROSE 5 % IV SOLN
Freq: Once | INTRAVENOUS | Status: AC
  Administered 2016-08-12: 11:00:00 via INTRAVENOUS

## 2016-08-12 MED ORDER — SODIUM CHLORIDE 0.9 % IV SOLN
Freq: Once | INTRAVENOUS | Status: AC
  Administered 2016-08-12: 11:00:00 via INTRAVENOUS
  Filled 2016-08-12: qty 5

## 2016-08-12 MED ORDER — FLUOROURACIL CHEMO INJECTION 2.5 GM/50ML
400.0000 mg/m2 | Freq: Once | INTRAVENOUS | Status: AC
  Administered 2016-08-12: 850 mg via INTRAVENOUS
  Filled 2016-08-12: qty 17

## 2016-08-12 MED ORDER — PALONOSETRON HCL INJECTION 0.25 MG/5ML
INTRAVENOUS | Status: AC
  Filled 2016-08-12: qty 5

## 2016-08-12 MED ORDER — PALONOSETRON HCL INJECTION 0.25 MG/5ML
0.2500 mg | Freq: Once | INTRAVENOUS | Status: AC
  Administered 2016-08-12: 0.25 mg via INTRAVENOUS

## 2016-08-12 MED ORDER — SODIUM CHLORIDE 0.9 % IV SOLN
2400.0000 mg/m2 | INTRAVENOUS | Status: DC
  Administered 2016-08-12: 5100 mg via INTRAVENOUS
  Filled 2016-08-12: qty 102

## 2016-08-12 MED ORDER — DEXAMETHASONE 4 MG PO TABS: ORAL_TABLET | ORAL | 1 refills | Status: DC

## 2016-08-12 MED ORDER — LEUCOVORIN CALCIUM INJECTION 350 MG
400.0000 mg/m2 | Freq: Once | INTRAMUSCULAR | Status: AC
  Administered 2016-08-12: 848 mg via INTRAVENOUS
  Filled 2016-08-12: qty 42.4

## 2016-08-12 MED ORDER — SODIUM CHLORIDE 0.9% FLUSH
10.0000 mL | INTRAVENOUS | Status: DC | PRN
Start: 2016-08-12 — End: 2016-08-12
  Administered 2016-08-12: 10 mL via INTRAVENOUS
  Filled 2016-08-12: qty 10

## 2016-08-12 MED ORDER — OXALIPLATIN CHEMO INJECTION 100 MG/20ML
85.0000 mg/m2 | Freq: Once | INTRAVENOUS | Status: AC
  Administered 2016-08-12: 180 mg via INTRAVENOUS
  Filled 2016-08-12: qty 36

## 2016-08-12 NOTE — Progress Notes (Signed)
[place] OFFICE PROGRESS NOTE   Diagnosis: Esophagus cancer  INTERVAL HISTORY:   Billy Romero returns as scheduled. He completed another cycle of FOLFOX beginning 07/29/2016. He reports nausea beginning on day 3 and lasting for approximately 3 days. Cold sensitivity lasted 3-4 days following chemotherapy. No recurrent syncope since beginning hydrocortisone. The diplopia has resolved. He has been working around his home. Dysphagia.  Objective:  Vital signs in last 24 hours:  Blood pressure 100/62, pulse 92, temperature 98 F (36.7 C), temperature source Oral, resp. rate 18, height _0  (1.803 m), weight 187 lb 8 oz (85 kg), SpO2 100 %.    HEENT: No thrush or ulcers Resp: Lungs clear bilaterally Cardio: Regular rate and rhythm GI: No hepatosplenomegaly, nontender Vascular: No leg edema Neuro: The extraocular movements appear intact   Portacath/PICC-without erythema  Lab Results:  Lab Results  Component Value Date   WBC 3.3 (L) 08/12/2016   HGB 11.4 (L) 08/12/2016   HCT 32.3 (L) 08/12/2016   MCV 92.6 08/12/2016   PLT 132 (L) 08/12/2016   NEUTROABS 2.0 08/12/2016     Medications: I have reviewed the patient's current medications.  Assessment/Plan: 1. 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 Radiation completed 06/10/2014  PET scan 07/01/2014 revealed improvement in the hypermetabolic esophagus mass and mediastinal lymphadenopathy  upper endoscopy 04/20/2015 -mid esophagus with mild narrowing , 2 small nodules measuring approximate 5 mm -biopsy with scant and detached fragments with high-grade squamous dysplasia , brushings  negative for malignant cells.  CT chest/abdomen/pelvis 05/25/2015 with development of bilateral pulmonary nodules; paratracheal and subcarinal nodes improved/similar; new small lower paraesophageal nodes which are indeterminate; esophageal wall thickening improved since the prior PET; no evidence of metastatic disease in the abdomen or pelvis.  PET scan 06/16/2015 revealed hypermetabolic lung nodules, hypermetabolic mediastinal nodes, hypermetabolism at the mid to distal esophagus has improved  bronchoscopy/EBUS on 07/04/2015 confirmed squamous cell carcinoma involving right lower lobe brushings, right bronchial washings, a level 10 R lymph node biopsy, and right lower lobe biopsy  PD-L1 no expression on the right lower lobe biopsy (less than 1%)  Cycle 1 Pembrolizumab 07/26/2015  Cycle 2 Pembrolizumab 08/16/2015  Cycle 3 Pembrolizumab 09/06/2015  Cycle 4 Pembrolizumab 09/27/2015  CT chest 10/11/2015-stable/decreased size of pulmonary nodules, enlargement of several mediastinal nodes  Cycle 5 Pembrolizumab 10/18/2015  Cycle 6 Pembrolizumab 11/08/2015   Cycle 7 Pembrolizumab08/05/2015  Cycle 8 Pembrolizumab08/23/2017  CT chest 01/15/2016-slight progression of mediastinal/upper abdominal lymphadenopathy and enlargement of a right lower lobe nodule  Cycle 9 Pembrolizumab 01/17/2016   Cycle 10 Pembrolizumab10/02/2016  Cycle 11 Pembrolizumab 02/28/2016  Cycle 12 Pembrolizumab 03/20/2016  CT chest 04/25/2016-minimal progression of chest lymphadenopathy and lung nodules, thickening at the mid thoracic esophagus  MRI brain 04/30/2016-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 Solid/liquid dysphagia secondary to #1  Jejunostomy feeding tube placement 04/07/2014  Feeding tube removed 05/30/2014.  3. Small bowel obstruction following placement of the jejunostomy feeding tube-resolved  4. History of ulcerative colitis  5. Aspiration pneumonia December 2015  6. 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 Fever/cough/dyspnea 04/30/2016-nasal swab positive for influenza A , chest x-ray with a left lower lobe Pneumonia -treated with Tamiflu and Levaquin   8. Admission 06/13/2016 with a cough/left-sided chest pain/dyspnea/tachycardia/low-grade fever-pulmonary embolism confirmed on a chest CT, progressive chest adenopathy, inflammatory changes versus lymphatic tumor spread in the right lower lobe.Maintained on Xarelto.  9. Port-A-Cath placement 06/11/2016  10. Dysphagia. Barium swallow 05/01/2016 with circumferential narrowing of the midesophagus causing moderate degree of stricture which would not allow the 13 mm barium tablet to pass. The dysphagia improved spontaneously. He developed recurrent dysphagia to solids 06/15/2016. He is tolerating liquids.  11.  Recurrent syncope events-borderline low cortisol level III/XII/MMXVIII, symptoms improved with hydrocortisone replacement  12.  Hypercalcemia of malignancy-improved    Disposition:  Billy Romero appears to be tolerating the chemotherapy well. The dysphagia and diplopia have improved. He will complete cycle 4 FOLFOX today. The plan is to schedule a restaging CT after cycle 5. The diplopia is markedly improved. Hopefully this indicates a response to the chemotherapy and radiation.  He will continue hydrocortisone replacement for apparent adrenal  insufficiency.  He has delayed nausea after chemotherapy. We added Decadron prophylaxis with this cycle.  25 minutes were spent with the patient today. The majority of the time was used for counseling and coordination of care.  Betsy Coder, MD  08/12/2016  2:06 PM

## 2016-08-12 NOTE — Patient Instructions (Signed)
[facility] Discharge Instructions for Patients Receiving Chemotherapy  Today you received the following chemotherapy agents:  Oxaliplatin, Leucovorin, Fluorouracil  To help prevent nausea and vomiting after your treatment, we encourage you to take your nausea medication as prescribed.   If you develop nausea and vomiting that is not controlled by your nausea medication, call the clinic.   BELOW ARE SYMPTOMS THAT SHOULD BE REPORTED IMMEDIATELY:  *FEVER GREATER THAN 100.5 F  *CHILLS WITH OR WITHOUT FEVER  NAUSEA AND VOMITING THAT IS NOT CONTROLLED WITH YOUR NAUSEA MEDICATION  *UNUSUAL SHORTNESS OF BREATH  *UNUSUAL BRUISING OR BLEEDING  TENDERNESS IN MOUTH AND THROAT WITH OR WITHOUT PRESENCE OF ULCERS  *URINARY PROBLEMS  *BOWEL PROBLEMS  UNUSUAL RASH Items with * indicate a potential emergency and should be followed up as soon as possible.  Feel free to call the clinic you have any questions or concerns. The clinic phone number is [phone].  Please show the CHEMO ALERT CARD at check-in to the Emergency Department and triage nurse.   

## 2016-08-12 NOTE — Patient Instructions (Signed)
Implanted Port Home Guide An implanted port is a type of central line that is placed under the skin. Central lines are used to provide IV access when treatment or nutrition needs to be given through a person's veins. Implanted ports are used for long-term IV access. An implanted port may be placed because:  You need IV medicine that would be irritating to the small veins in your hands or arms.  You need long-term IV medicines, such as antibiotics.  You need IV nutrition for a long period.  You need frequent blood draws for lab tests.  You need dialysis.  Implanted ports are usually placed in the chest area, but they can also be placed in the upper arm, the abdomen, or the leg. An implanted port has two main parts:  Reservoir. The reservoir is round and will appear as a small, raised area under your skin. The reservoir is the part where a needle is inserted to give medicines or draw blood.  Catheter. The catheter is a thin, flexible tube that extends from the reservoir. The catheter is placed into a large vein. Medicine that is inserted into the reservoir goes into the catheter and then into the vein.  How will I care for my incision site? Do not get the incision site wet. Bathe or shower as directed by your health care provider. How is my port accessed? Special steps must be taken to access the port:  Before the port is accessed, a numbing cream can be placed on the skin. This helps numb the skin over the port site.  Your health care provider uses a sterile technique to access the port. ? Your health care provider must put on a mask and sterile gloves. ? The skin over your port is cleaned carefully with an antiseptic and allowed to dry. ? The port is gently pinched between sterile gloves, and a needle is inserted into the port.  Only "non-coring" port needles should be used to access the port. Once the port is accessed, a blood return should be checked. This helps ensure that the port  is in the vein and is not clogged.  If your port needs to remain accessed for a constant infusion, a clear (transparent) bandage will be placed over the needle site. The bandage and needle will need to be changed every week, or as directed by your health care provider.  Keep the bandage covering the needle clean and dry. Do not get it wet. Follow your health care provider's instructions on how to take a shower or bath while the port is accessed.  If your port does not need to stay accessed, no bandage is needed over the port.  What is flushing? Flushing helps keep the port from getting clogged. Follow your health care provider's instructions on how and when to flush the port. Ports are usually flushed with saline solution or a medicine called heparin. The need for flushing will depend on how the port is used.  If the port is used for intermittent medicines or blood draws, the port will need to be flushed: ? After medicines have been given. ? After blood has been drawn. ? As part of routine maintenance.  If a constant infusion is running, the port may not need to be flushed.  How long will my port stay implanted? The port can stay in for as long as your health care provider thinks it is needed. When it is time for the port to come out, surgery will be   done to remove it. The procedure is similar to the one performed when the port was put in. When should I seek immediate medical care? When you have an implanted port, you should seek immediate medical care if:  You notice a bad smell coming from the incision site.  You have swelling, redness, or drainage at the incision site.  You have more swelling or pain at the port site or the surrounding area.  You have a fever that is not controlled with medicine.  This information is not intended to replace advice given to you by your health care provider. Make sure you discuss any questions you have with your health care provider. Document  Released: 04/15/2005 Document Revised: 09/21/2015 Document Reviewed: 12/21/2012 Elsevier Interactive Patient Education  2017 [employer]  

## 2016-08-13 ENCOUNTER — Telehealth: Payer: Self-pay | Admitting: Oncology

## 2016-08-13 NOTE — Telephone Encounter (Signed)
Patient bypassed scheduling on 08/12/16 visit. Appointments scheduled per 08/12/16 los. 04/30 N/A. 05/01 ok per Dr Benay Spice. Appointments confirmed with patient, will pick up a copy of updated schedule at visit on 08/14/16, per patient.

## 2016-08-14 ENCOUNTER — Ambulatory Visit (HOSPITAL_BASED_OUTPATIENT_CLINIC_OR_DEPARTMENT_OTHER): Payer: BLUE CROSS/BLUE SHIELD

## 2016-08-14 VITALS — BP 99/59 | HR 86 | Temp 98.0°F | Resp 18

## 2016-08-14 DIAGNOSIS — Z452 Encounter for adjustment and management of vascular access device: Secondary | ICD-10-CM

## 2016-08-14 DIAGNOSIS — C7801 Secondary malignant neoplasm of right lung: Secondary | ICD-10-CM

## 2016-08-14 DIAGNOSIS — C771 Secondary and unspecified malignant neoplasm of intrathoracic lymph nodes: Secondary | ICD-10-CM | POA: Diagnosis not present

## 2016-08-14 DIAGNOSIS — C154 Malignant neoplasm of middle third of esophagus: Secondary | ICD-10-CM

## 2016-08-14 MED ORDER — SODIUM CHLORIDE 0.9% FLUSH
10.0000 mL | INTRAVENOUS | Status: DC | PRN
  Administered 2016-08-14: 10 mL
  Filled 2016-08-14: qty 10

## 2016-08-14 MED ORDER — HEPARIN SOD (PORK) LOCK FLUSH 100 UNIT/ML IV SOLN
500.0000 [IU] | Freq: Once | INTRAVENOUS | Status: AC | PRN
  Administered 2016-08-14: 500 [IU]
  Filled 2016-08-14: qty 5

## 2016-08-25 ENCOUNTER — Other Ambulatory Visit: Payer: Self-pay | Admitting: Oncology

## 2016-08-27 ENCOUNTER — Telehealth: Payer: Self-pay | Admitting: Oncology

## 2016-08-27 ENCOUNTER — Ambulatory Visit: Payer: BLUE CROSS/BLUE SHIELD

## 2016-08-27 ENCOUNTER — Other Ambulatory Visit (HOSPITAL_BASED_OUTPATIENT_CLINIC_OR_DEPARTMENT_OTHER): Payer: BLUE CROSS/BLUE SHIELD

## 2016-08-27 ENCOUNTER — Ambulatory Visit (HOSPITAL_BASED_OUTPATIENT_CLINIC_OR_DEPARTMENT_OTHER): Payer: BLUE CROSS/BLUE SHIELD

## 2016-08-27 ENCOUNTER — Ambulatory Visit (HOSPITAL_BASED_OUTPATIENT_CLINIC_OR_DEPARTMENT_OTHER): Payer: BLUE CROSS/BLUE SHIELD | Admitting: Nurse Practitioner

## 2016-08-27 VITALS — BP 112/71 | HR 90 | Temp 98.5°F | Resp 16 | Wt 187.6 lb

## 2016-08-27 DIAGNOSIS — C771 Secondary and unspecified malignant neoplasm of intrathoracic lymph nodes: Secondary | ICD-10-CM

## 2016-08-27 DIAGNOSIS — C7801 Secondary malignant neoplasm of right lung: Secondary | ICD-10-CM | POA: Diagnosis not present

## 2016-08-27 DIAGNOSIS — C154 Malignant neoplasm of middle third of esophagus: Secondary | ICD-10-CM

## 2016-08-27 DIAGNOSIS — C7951 Secondary malignant neoplasm of bone: Secondary | ICD-10-CM

## 2016-08-27 DIAGNOSIS — Z95828 Presence of other vascular implants and grafts: Secondary | ICD-10-CM

## 2016-08-27 LAB — CBC WITH DIFFERENTIAL/PLATELET
BASO%: 0.5 % (ref 0.0–2.0)
Basophils Absolute: 0 10*3/uL (ref 0.0–0.1)
EOS%: 0.1 % (ref 0.0–7.0)
Eosinophils Absolute: 0 10*3/uL (ref 0.0–0.5)
HCT: 34.5 % — ABNORMAL LOW (ref 38.4–49.9)
HEMOGLOBIN: 11.9 g/dL — AB (ref 13.0–17.1)
LYMPH#: 1 10*3/uL (ref 0.9–3.3)
LYMPH%: 19.5 % (ref 14.0–49.0)
MCH: 33.8 pg — ABNORMAL HIGH (ref 27.2–33.4)
MCHC: 34.4 g/dL (ref 32.0–36.0)
MCV: 98 fL (ref 79.3–98.0)
MONO#: 0.8 10*3/uL (ref 0.1–0.9)
MONO%: 16.5 % — AB (ref 0.0–14.0)
NEUT%: 63.4 % (ref 39.0–75.0)
NEUTROS ABS: 3.1 10*3/uL (ref 1.5–6.5)
Platelets: 127 10*3/uL — ABNORMAL LOW (ref 140–400)
RBC: 3.52 10*6/uL — ABNORMAL LOW (ref 4.20–5.82)
RDW: 21.7 % — AB (ref 11.0–14.6)
WBC: 4.9 10*3/uL (ref 4.0–10.3)

## 2016-08-27 LAB — COMPREHENSIVE METABOLIC PANEL
ALK PHOS: 125 U/L (ref 40–150)
ALT: 40 U/L (ref 0–55)
ANION GAP: 9 meq/L (ref 3–11)
AST: 23 U/L (ref 5–34)
Albumin: 3.3 g/dL — ABNORMAL LOW (ref 3.5–5.0)
BUN: 13.3 mg/dL (ref 7.0–26.0)
CO2: 26 meq/L (ref 22–29)
Calcium: 9.3 mg/dL (ref 8.4–10.4)
Chloride: 108 mEq/L (ref 98–109)
Creatinine: 0.8 mg/dL (ref 0.7–1.3)
GLUCOSE: 129 mg/dL (ref 70–140)
POTASSIUM: 3.7 meq/L (ref 3.5–5.1)
SODIUM: 143 meq/L (ref 136–145)
Total Bilirubin: 0.44 mg/dL (ref 0.20–1.20)
Total Protein: 6 g/dL — ABNORMAL LOW (ref 6.4–8.3)

## 2016-08-27 MED ORDER — SODIUM CHLORIDE 0.9 % IV SOLN
Freq: Once | INTRAVENOUS | Status: AC
  Administered 2016-08-27: 13:00:00 via INTRAVENOUS
  Filled 2016-08-27: qty 5

## 2016-08-27 MED ORDER — SODIUM CHLORIDE 0.9 % IV SOLN
2400.0000 mg/m2 | INTRAVENOUS | Status: DC
  Administered 2016-08-27: 5100 mg via INTRAVENOUS
  Filled 2016-08-27: qty 102

## 2016-08-27 MED ORDER — LEUCOVORIN CALCIUM INJECTION 350 MG
400.0000 mg/m2 | Freq: Once | INTRAVENOUS | Status: AC
  Administered 2016-08-27: 848 mg via INTRAVENOUS
  Filled 2016-08-27: qty 42.4

## 2016-08-27 MED ORDER — PALONOSETRON HCL INJECTION 0.25 MG/5ML
INTRAVENOUS | Status: AC
  Filled 2016-08-27: qty 5

## 2016-08-27 MED ORDER — FLUOROURACIL CHEMO INJECTION 2.5 GM/50ML
400.0000 mg/m2 | Freq: Once | INTRAVENOUS | Status: AC
  Administered 2016-08-27: 850 mg via INTRAVENOUS
  Filled 2016-08-27: qty 17

## 2016-08-27 MED ORDER — DEXTROSE 5 % IV SOLN
Freq: Once | INTRAVENOUS | Status: AC
  Administered 2016-08-27: 13:00:00 via INTRAVENOUS

## 2016-08-27 MED ORDER — SODIUM CHLORIDE 0.9% FLUSH
10.0000 mL | INTRAVENOUS | Status: DC | PRN
  Administered 2016-08-27: 10 mL via INTRAVENOUS
  Filled 2016-08-27: qty 10

## 2016-08-27 MED ORDER — PALONOSETRON HCL INJECTION 0.25 MG/5ML
0.2500 mg | Freq: Once | INTRAVENOUS | Status: AC
  Administered 2016-08-27: 0.25 mg via INTRAVENOUS

## 2016-08-27 MED ORDER — OXALIPLATIN CHEMO INJECTION 100 MG/20ML
85.0000 mg/m2 | Freq: Once | INTRAVENOUS | Status: AC
  Administered 2016-08-27: 180 mg via INTRAVENOUS
  Filled 2016-08-27: qty 36

## 2016-08-27 NOTE — Telephone Encounter (Signed)
Appointments scheduled per 08/27/16 los. Patient was paged to infusion area. Will pick up copy in infusion.

## 2016-08-27 NOTE — Patient Instructions (Signed)
[facility] Discharge Instructions for Patients Receiving Chemotherapy  Today you received the following chemotherapy agents: Oxaliplatin, Leucovorin and Adrucil.   To help prevent nausea and vomiting after your treatment, we encourage you to take your nausea medication as directed.  If you develop nausea and vomiting that is not controlled by your nausea medication, call the clinic.   BELOW ARE SYMPTOMS THAT SHOULD BE REPORTED IMMEDIATELY:  *FEVER GREATER THAN 100.5 F  *CHILLS WITH OR WITHOUT FEVER  NAUSEA AND VOMITING THAT IS NOT CONTROLLED WITH YOUR NAUSEA MEDICATION  *UNUSUAL SHORTNESS OF BREATH  *UNUSUAL BRUISING OR BLEEDING  TENDERNESS IN MOUTH AND THROAT WITH OR WITHOUT PRESENCE OF ULCERS  *URINARY PROBLEMS  *BOWEL PROBLEMS  UNUSUAL RASH Items with * indicate a potential emergency and should be followed up as soon as possible.  Feel free to call the clinic you have any questions or concerns. The clinic phone number is [phone].  Please show the CHEMO ALERT CARD at check-in to the Emergency Department and triage nurse.   

## 2016-08-27 NOTE — Progress Notes (Signed)
[facility] OFFICE PROGRESS NOTE   Diagnosis:  Esophagus cancer  INTERVAL HISTORY:   [name] returns as scheduled. He completed cycle 4 FOLFOX 08/12/2016. He has occasional mild nausea. No mouth sores. No diarrhea. He is intermittently constipated. Cold sensitivity lasted about 5 days. No persistent neuropathy symptoms. No diplopia. He states that he feels "great".  Objective:  Vital signs in last 24 hours:  Blood pressure 112/71, pulse 90, temperature 98.5 F (36.9 C), temperature source Oral, resp. rate 16, weight 187 lb 9.6 oz (85.1 kg), SpO2 100 %.    HEENT: No thrush or ulcers. Resp: Lungs clear bilaterally. Cardio: Regular rate and rhythm. GI: Abdomen soft and nontender. No hepatomegaly. Vascular: No leg edema. Neuro: Vibratory sense intact over the fingertips per tuning fork exam.  Port-A-Cath without erythema.  Lab Results:  Lab Results  Component Value Date   WBC 4.9 08/27/2016   HGB 11.9 (L) 08/27/2016   HCT 34.5 (L) 08/27/2016   MCV 98.0 08/27/2016   PLT 127 (L) 08/27/2016   NEUTROABS 3.1 08/27/2016    Imaging:  No results found.  Medications: I have reviewed the patient's current medications.  Assessment/Plan: 1. 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 Radiation completed 06/10/2014  PET scan 07/01/2014 revealed improvement in the hypermetabolic esophagus mass and mediastinal lymphadenopathy  upper endoscopy 04/20/2015 -mid esophagus with mild narrowing , 2 small nodules measuring approximate 5 mm -biopsy with scant and detached fragments with high-grade squamous dysplasia , brushings negative for malignant  cells.  CT chest/abdomen/pelvis 05/25/2015 with development of bilateral pulmonary nodules; paratracheal and subcarinal nodes improved/similar; new small lower paraesophageal nodes which are indeterminate; esophageal wall thickening improved since the prior PET; no evidence of metastatic disease in the abdomen or pelvis.  PET scan 06/16/2015 revealed hypermetabolic lung nodules, hypermetabolic mediastinal nodes, hypermetabolism at the mid to distal esophagus has improved  bronchoscopy/EBUS on 07/04/2015 confirmed squamous cell carcinoma involving right lower lobe brushings, right bronchial washings, a level 10 R lymph node biopsy, and right lower lobe biopsy  PD-L1 no expression on the right lower lobe biopsy (less than 1%)  Cycle 1 Pembrolizumab 07/26/2015  Cycle 2 Pembrolizumab 08/16/2015  Cycle 3 Pembrolizumab 09/06/2015  Cycle 4 Pembrolizumab 09/27/2015  CT chest 10/11/2015-stable/decreased size of pulmonary nodules, enlargement of several mediastinal nodes  Cycle 5 Pembrolizumab 10/18/2015  Cycle 6 Pembrolizumab 11/08/2015   Cycle 7 Pembrolizumab08/05/2015  Cycle 8 Pembrolizumab08/23/2017  CT chest 01/15/2016-slight progression of mediastinal/upper abdominal lymphadenopathy and enlargement of a right lower lobe nodule  Cycle 9 Pembrolizumab 01/17/2016   Cycle 10 Pembrolizumab10/02/2016  Cycle 11 Pembrolizumab 02/28/2016  Cycle 12 Pembrolizumab 03/20/2016  CT chest 04/25/2016-minimal progression of chest lymphadenopathy and lung nodules, thickening at the mid thoracic esophagus  MRI brain 04/30/2016-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 Solid/liquid dysphagia secondary to #1  Jejunostomy  feeding tube placement 04/07/2014  Feeding tube removed 05/30/2014.  3. Small bowel obstruction following placement of the jejunostomy feeding tube-resolved  4. History of ulcerative colitis  5. Aspiration pneumonia December 2015  6. 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 Fever/cough/dyspnea 04/30/2016-nasal swab positive for influenza A , chest x-ray with a left lower lobe Pneumonia -treated with Tamiflu and Levaquin   8. Admission 06/13/2016 with a cough/left-sided chest pain/dyspnea/tachycardia/low-grade fever-pulmonary embolism confirmed on a chest CT, progressive chest adenopathy, inflammatory changes versus lymphatic tumor spread in the right lower lobe.Maintained on Xarelto.  9. Port-A-Cath placement 06/11/2016  10. Dysphagia. Barium swallow 05/01/2016 with circumferential narrowing of the midesophagus causing moderate degree of stricture which would not allow the 13 mm barium tablet to pass. The dysphagia improved spontaneously. He developed recurrent dysphagia to solids 06/15/2016. He is tolerating liquids.  11. Recurrent syncope events-borderline low cortisol level III/XII/MMXVIII, symptoms improved with hydrocortisone replacement  12. Hypercalcemia of malignancy-improved   Disposition: Billy Romero appears well. He has completed 4 cycles of FOLFOX. Plan to proceed with cycle 5 today as scheduled. He will have restaging CT scans prior to his next visit in 2 weeks.  He will return for a follow-up visit on 09/10/2016. He will contact the office in the interim with any problems.   Plan reviewed with Dr. Benay Spice.    Ned Card ANP/GNP-BC    08/27/2016  12:10 PM

## 2016-08-27 NOTE — Progress Notes (Signed)
Nutrition Follow-up:  Nutrition follow-up completed with patient receiving chemotherapy for obstructive esophageal cancer.  Patient reports his appetite is good and he can eat whatever her wants.  No problems with consistency of foods.  Reports that typically will eat eggs and bacon or pancakes with protein shake that wife prepares every am.  For lunch typically has 1/2 sandwich or something light then for dinner meat and vegetables.    No nutrition symptoms reported today.   Medications: phenergan, decadron  Labs: reviewed  Anthropometrics:   Weight today 187 lb 9.6 oz increased from 183.9 lb on 4/2   NUTRITION DIAGNOSIS: Unintentional weight loss improved    INTERVENTION:   Encouraged patient to continue consuming good sources of calories and protein to prevent weight loss and maintain nutrition.     MONITORING, EVALUATION, GOAL: Patient will consume adequate calories and protein to minimize weight loss   NEXT VISIT: 5/15 during infusion  Sayde Lish B. Zenia Resides, [place], [place] Registered Dietitian [phone] (pager)

## 2016-08-29 ENCOUNTER — Ambulatory Visit (HOSPITAL_BASED_OUTPATIENT_CLINIC_OR_DEPARTMENT_OTHER): Payer: BLUE CROSS/BLUE SHIELD

## 2016-08-29 VITALS — BP 116/75 | HR 72 | Temp 98.3°F | Resp 16

## 2016-08-29 DIAGNOSIS — C154 Malignant neoplasm of middle third of esophagus: Secondary | ICD-10-CM

## 2016-08-29 MED ORDER — SODIUM CHLORIDE 0.9% FLUSH
10.0000 mL | INTRAVENOUS | Status: DC | PRN
  Administered 2016-08-29: 10 mL
  Filled 2016-08-29: qty 10

## 2016-08-29 MED ORDER — HEPARIN SOD (PORK) LOCK FLUSH 100 UNIT/ML IV SOLN
500.0000 [IU] | Freq: Once | INTRAVENOUS | Status: AC | PRN
  Administered 2016-08-29: 500 [IU]
  Filled 2016-08-29: qty 5

## 2016-08-29 NOTE — Patient Instructions (Signed)
Implanted Port Home Guide An implanted port is a type of central line that is placed under the skin. Central lines are used to provide IV access when treatment or nutrition needs to be given through a person's veins. Implanted ports are used for long-term IV access. An implanted port may be placed because:  You need IV medicine that would be irritating to the small veins in your hands or arms.  You need long-term IV medicines, such as antibiotics.  You need IV nutrition for a long period.  You need frequent blood draws for lab tests.  You need dialysis.  Implanted ports are usually placed in the chest area, but they can also be placed in the upper arm, the abdomen, or the leg. An implanted port has two main parts:  Reservoir. The reservoir is round and will appear as a small, raised area under your skin. The reservoir is the part where a needle is inserted to give medicines or draw blood.  Catheter. The catheter is a thin, flexible tube that extends from the reservoir. The catheter is placed into a large vein. Medicine that is inserted into the reservoir goes into the catheter and then into the vein.  How will I care for my incision site? Do not get the incision site wet. Bathe or shower as directed by your health care provider. How is my port accessed? Special steps must be taken to access the port:  Before the port is accessed, a numbing cream can be placed on the skin. This helps numb the skin over the port site.  Your health care provider uses a sterile technique to access the port. ? Your health care provider must put on a mask and sterile gloves. ? The skin over your port is cleaned carefully with an antiseptic and allowed to dry. ? The port is gently pinched between sterile gloves, and a needle is inserted into the port.  Only "non-coring" port needles should be used to access the port. Once the port is accessed, a blood return should be checked. This helps ensure that the port  is in the vein and is not clogged.  If your port needs to remain accessed for a constant infusion, a clear (transparent) bandage will be placed over the needle site. The bandage and needle will need to be changed every week, or as directed by your health care provider.  Keep the bandage covering the needle clean and dry. Do not get it wet. Follow your health care provider's instructions on how to take a shower or bath while the port is accessed.  If your port does not need to stay accessed, no bandage is needed over the port.  What is flushing? Flushing helps keep the port from getting clogged. Follow your health care provider's instructions on how and when to flush the port. Ports are usually flushed with saline solution or a medicine called heparin. The need for flushing will depend on how the port is used.  If the port is used for intermittent medicines or blood draws, the port will need to be flushed: ? After medicines have been given. ? After blood has been drawn. ? As part of routine maintenance.  If a constant infusion is running, the port may not need to be flushed.  How long will my port stay implanted? The port can stay in for as long as your health care provider thinks it is needed. When it is time for the port to come out, surgery will be   done to remove it. The procedure is similar to the one performed when the port was put in. When should I seek immediate medical care? When you have an implanted port, you should seek immediate medical care if:  You notice a bad smell coming from the incision site.  You have swelling, redness, or drainage at the incision site.  You have more swelling or pain at the port site or the surrounding area.  You have a fever that is not controlled with medicine.  This information is not intended to replace advice given to you by your health care provider. Make sure you discuss any questions you have with your health care provider. Document  Released: 04/15/2005 Document Revised: 09/21/2015 Document Reviewed: 12/21/2012 Elsevier Interactive Patient Education  2017 [employer]  

## 2016-09-08 ENCOUNTER — Other Ambulatory Visit: Payer: Self-pay | Admitting: Oncology

## 2016-09-09 ENCOUNTER — Ambulatory Visit (HOSPITAL_COMMUNITY)
Admission: RE | Admit: 2016-09-09 | Discharge: 2016-09-09 | Disposition: A | Discharge status: Home / Self Care | Payer: BLUE CROSS/BLUE SHIELD | Source: Ambulatory Visit | Attending: Nurse Practitioner | Admitting: Nurse Practitioner

## 2016-09-09 ENCOUNTER — Other Ambulatory Visit: Payer: Self-pay | Admitting: Nurse Practitioner

## 2016-09-09 ENCOUNTER — Other Ambulatory Visit: Payer: Self-pay | Admitting: *Deleted

## 2016-09-09 DIAGNOSIS — C7951 Secondary malignant neoplasm of bone: Secondary | ICD-10-CM | POA: Insufficient documentation

## 2016-09-09 DIAGNOSIS — D492 Neoplasm of unspecified behavior of bone, soft tissue, and skin: Secondary | ICD-10-CM | POA: Insufficient documentation

## 2016-09-09 DIAGNOSIS — C154 Malignant neoplasm of middle third of esophagus: Secondary | ICD-10-CM | POA: Diagnosis not present

## 2016-09-09 DIAGNOSIS — R918 Other nonspecific abnormal finding of lung field: Secondary | ICD-10-CM | POA: Insufficient documentation

## 2016-09-09 MED ORDER — HEPARIN SOD (PORK) LOCK FLUSH 100 UNIT/ML IV SOLN
INTRAVENOUS | Status: AC
  Administered 2016-09-09: 500 [IU]
  Filled 2016-09-09: qty 5

## 2016-09-09 MED ORDER — HYDROCORTISONE 20 MG PO TABS: 20.0000 mg | ORAL_TABLET | Freq: Two times a day (BID) | ORAL | 1 refills | Status: DC

## 2016-09-09 MED ORDER — IOPAMIDOL (ISOVUE-300) INJECTION 61%
INTRAVENOUS | Status: AC
  Administered 2016-09-09: 75 mL
  Filled 2016-09-09: qty 75

## 2016-09-10 ENCOUNTER — Ambulatory Visit (HOSPITAL_BASED_OUTPATIENT_CLINIC_OR_DEPARTMENT_OTHER): Payer: BLUE CROSS/BLUE SHIELD | Admitting: Oncology

## 2016-09-10 ENCOUNTER — Telehealth: Payer: Self-pay | Admitting: Oncology

## 2016-09-10 ENCOUNTER — Ambulatory Visit: Payer: BLUE CROSS/BLUE SHIELD

## 2016-09-10 ENCOUNTER — Ambulatory Visit (HOSPITAL_BASED_OUTPATIENT_CLINIC_OR_DEPARTMENT_OTHER): Payer: BLUE CROSS/BLUE SHIELD

## 2016-09-10 ENCOUNTER — Other Ambulatory Visit: Payer: Self-pay | Admitting: *Deleted

## 2016-09-10 ENCOUNTER — Other Ambulatory Visit (HOSPITAL_BASED_OUTPATIENT_CLINIC_OR_DEPARTMENT_OTHER): Payer: BLUE CROSS/BLUE SHIELD

## 2016-09-10 VITALS — BP 120/84 | HR 89 | Temp 98.6°F | Resp 18 | Ht 71.0 in | Wt 189.3 lb

## 2016-09-10 DIAGNOSIS — H532 Diplopia: Secondary | ICD-10-CM | POA: Diagnosis not present

## 2016-09-10 DIAGNOSIS — C7801 Secondary malignant neoplasm of right lung: Secondary | ICD-10-CM | POA: Diagnosis not present

## 2016-09-10 DIAGNOSIS — C771 Secondary and unspecified malignant neoplasm of intrathoracic lymph nodes: Secondary | ICD-10-CM

## 2016-09-10 DIAGNOSIS — Z5111 Encounter for antineoplastic chemotherapy: Secondary | ICD-10-CM

## 2016-09-10 DIAGNOSIS — C154 Malignant neoplasm of middle third of esophagus: Secondary | ICD-10-CM

## 2016-09-10 DIAGNOSIS — Z95828 Presence of other vascular implants and grafts: Secondary | ICD-10-CM

## 2016-09-10 LAB — CBC WITH DIFFERENTIAL/PLATELET
BASO%: 0 % (ref 0.0–2.0)
Basophils Absolute: 0 10*3/uL (ref 0.0–0.1)
EOS ABS: 0 10*3/uL (ref 0.0–0.5)
EOS%: 0 % (ref 0.0–7.0)
HEMATOCRIT: 33 % — AB (ref 38.4–49.9)
HGB: 11.4 g/dL — ABNORMAL LOW (ref 13.0–17.1)
LYMPH%: 23.1 % (ref 14.0–49.0)
MCH: 33.8 pg — ABNORMAL HIGH (ref 27.2–33.4)
MCHC: 34.5 g/dL (ref 32.0–36.0)
MCV: 97.9 fL (ref 79.3–98.0)
MONO#: 0.5 10*3/uL (ref 0.1–0.9)
MONO%: 14.2 % — ABNORMAL HIGH (ref 0.0–14.0)
NEUT%: 62.7 % (ref 39.0–75.0)
NEUTROS ABS: 2 10*3/uL (ref 1.5–6.5)
PLATELETS: 110 10*3/uL — AB (ref 140–400)
RBC: 3.37 10*6/uL — AB (ref 4.20–5.82)
RDW: 18.9 % — ABNORMAL HIGH (ref 11.0–14.6)
WBC: 3.3 10*3/uL — ABNORMAL LOW (ref 4.0–10.3)
lymph#: 0.8 10*3/uL — ABNORMAL LOW (ref 0.9–3.3)

## 2016-09-10 LAB — COMPREHENSIVE METABOLIC PANEL
ALK PHOS: 110 U/L (ref 40–150)
ALT: 43 U/L (ref 0–55)
ANION GAP: 10 meq/L (ref 3–11)
AST: 26 U/L (ref 5–34)
Albumin: 3.2 g/dL — ABNORMAL LOW (ref 3.5–5.0)
BUN: 14 mg/dL (ref 7.0–26.0)
CALCIUM: 9.2 mg/dL (ref 8.4–10.4)
CO2: 24 mEq/L (ref 22–29)
CREATININE: 0.8 mg/dL (ref 0.7–1.3)
Chloride: 107 mEq/L (ref 98–109)
EGFR: >90 mL/min/{1.73_m2} (ref >90)
Glucose: 118 mg/dl (ref 70–140)
POTASSIUM: 4.4 meq/L (ref 3.5–5.1)
Sodium: 141 mEq/L (ref 136–145)
TOTAL PROTEIN: 5.9 g/dL — AB (ref 6.4–8.3)
Total Bilirubin: 0.43 mg/dL (ref 0.20–1.20)

## 2016-09-10 MED ORDER — SODIUM CHLORIDE 0.9 % IV SOLN
Freq: Once | INTRAVENOUS | Status: AC
  Administered 2016-09-10: 11:00:00 via INTRAVENOUS
  Filled 2016-09-10: qty 5

## 2016-09-10 MED ORDER — FLUOROURACIL CHEMO INJECTION 2.5 GM/50ML
400.0000 mg/m2 | Freq: Once | INTRAVENOUS | Status: AC
Start: 2016-09-10 — End: 2016-09-10
  Administered 2016-09-10: 850 mg via INTRAVENOUS
  Filled 2016-09-10: qty 17

## 2016-09-10 MED ORDER — SODIUM CHLORIDE 0.9% FLUSH
10.0000 mL | INTRAVENOUS | Status: DC | PRN
  Administered 2016-09-10: 10 mL via INTRAVENOUS
  Filled 2016-09-10: qty 10

## 2016-09-10 MED ORDER — LEUCOVORIN CALCIUM INJECTION 350 MG
400.0000 mg/m2 | Freq: Once | INTRAMUSCULAR | Status: AC
  Administered 2016-09-10: 848 mg via INTRAVENOUS
  Filled 2016-09-10: qty 42.4

## 2016-09-10 MED ORDER — DEXTROSE 5 % IV SOLN
85.0000 mg/m2 | Freq: Once | INTRAVENOUS | Status: AC
  Administered 2016-09-10: 180 mg via INTRAVENOUS
  Filled 2016-09-10: qty 36

## 2016-09-10 MED ORDER — PROMETHAZINE HCL 25 MG PO TABS: 25.0000 mg | ORAL_TABLET | Freq: Four times a day (QID) | ORAL | 0 refills | Status: DC | PRN

## 2016-09-10 MED ORDER — SODIUM CHLORIDE 0.9 % IV SOLN
2400.0000 mg/m2 | INTRAVENOUS | Status: DC
  Administered 2016-09-10: 5100 mg via INTRAVENOUS
  Filled 2016-09-10: qty 102

## 2016-09-10 MED ORDER — PALONOSETRON HCL INJECTION 0.25 MG/5ML
0.2500 mg | Freq: Once | INTRAVENOUS | Status: AC
  Administered 2016-09-10: 0.25 mg via INTRAVENOUS

## 2016-09-10 MED ORDER — PALONOSETRON HCL INJECTION 0.25 MG/5ML
INTRAVENOUS | Status: AC
  Filled 2016-09-10: qty 5

## 2016-09-10 NOTE — Telephone Encounter (Signed)
Gave patient AVS and calender per 5/15 los. - appt for 5/30 already scheduled per 5/15.

## 2016-09-10 NOTE — Patient Instructions (Signed)
Implanted Port Home Guide An implanted port is a type of central line that is placed under the skin. Central lines are used to provide IV access when treatment or nutrition needs to be given through a person's veins. Implanted ports are used for long-term IV access. An implanted port may be placed because:  You need IV medicine that would be irritating to the small veins in your hands or arms.  You need long-term IV medicines, such as antibiotics.  You need IV nutrition for a long period.  You need frequent blood draws for lab tests.  You need dialysis.  Implanted ports are usually placed in the chest area, but they can also be placed in the upper arm, the abdomen, or the leg. An implanted port has two main parts:  Reservoir. The reservoir is round and will appear as a small, raised area under your skin. The reservoir is the part where a needle is inserted to give medicines or draw blood.  Catheter. The catheter is a thin, flexible tube that extends from the reservoir. The catheter is placed into a large vein. Medicine that is inserted into the reservoir goes into the catheter and then into the vein.  How will I care for my incision site? Do not get the incision site wet. Bathe or shower as directed by your health care provider. How is my port accessed? Special steps must be taken to access the port:  Before the port is accessed, a numbing cream can be placed on the skin. This helps numb the skin over the port site.  Your health care provider uses a sterile technique to access the port. ? Your health care provider must put on a mask and sterile gloves. ? The skin over your port is cleaned carefully with an antiseptic and allowed to dry. ? The port is gently pinched between sterile gloves, and a needle is inserted into the port.  Only "non-coring" port needles should be used to access the port. Once the port is accessed, a blood return should be checked. This helps ensure that the port  is in the vein and is not clogged.  If your port needs to remain accessed for a constant infusion, a clear (transparent) bandage will be placed over the needle site. The bandage and needle will need to be changed every week, or as directed by your health care provider.  Keep the bandage covering the needle clean and dry. Do not get it wet. Follow your health care provider's instructions on how to take a shower or bath while the port is accessed.  If your port does not need to stay accessed, no bandage is needed over the port.  What is flushing? Flushing helps keep the port from getting clogged. Follow your health care provider's instructions on how and when to flush the port. Ports are usually flushed with saline solution or a medicine called heparin. The need for flushing will depend on how the port is used.  If the port is used for intermittent medicines or blood draws, the port will need to be flushed: ? After medicines have been given. ? After blood has been drawn. ? As part of routine maintenance.  If a constant infusion is running, the port may not need to be flushed.  How long will my port stay implanted? The port can stay in for as long as your health care provider thinks it is needed. When it is time for the port to come out, surgery will be   done to remove it. The procedure is similar to the one performed when the port was put in. When should I seek immediate medical care? When you have an implanted port, you should seek immediate medical care if:  You notice a bad smell coming from the incision site.  You have swelling, redness, or drainage at the incision site.  You have more swelling or pain at the port site or the surrounding area.  You have a fever that is not controlled with medicine.  This information is not intended to replace advice given to you by your health care provider. Make sure you discuss any questions you have with your health care provider. Document  Released: 04/15/2005 Document Revised: 09/21/2015 Document Reviewed: 12/21/2012 Elsevier Interactive Patient Education  2017 [employer]  

## 2016-09-10 NOTE — Patient Instructions (Signed)
[facility] Discharge Instructions for Patients Receiving Chemotherapy  Today you received the following chemotherapy agents: Oxaliplatin, Leucovorin and Adrucil.   To help prevent nausea and vomiting after your treatment, we encourage you to take your nausea medication as directed.  If you develop nausea and vomiting that is not controlled by your nausea medication, call the clinic.   BELOW ARE SYMPTOMS THAT SHOULD BE REPORTED IMMEDIATELY:  *FEVER GREATER THAN 100.5 F  *CHILLS WITH OR WITHOUT FEVER  NAUSEA AND VOMITING THAT IS NOT CONTROLLED WITH YOUR NAUSEA MEDICATION  *UNUSUAL SHORTNESS OF BREATH  *UNUSUAL BRUISING OR BLEEDING  TENDERNESS IN MOUTH AND THROAT WITH OR WITHOUT PRESENCE OF ULCERS  *URINARY PROBLEMS  *BOWEL PROBLEMS  UNUSUAL RASH Items with * indicate a potential emergency and should be followed up as soon as possible.  Feel free to call the clinic you have any questions or concerns. The clinic phone number is [phone].  Please show the CHEMO ALERT CARD at check-in to the Emergency Department and triage nurse.   

## 2016-09-10 NOTE — Progress Notes (Signed)
Nutrition Follow-up:  Nutrition follow-up completed with patient receiving chemotherapy for obstructive esophageal cancer.    Patient reports that appetite is still doing well.  Reports that he can eat anything that he wants.    Reports that he recently got good news regarding his scan and he is pleased.    No nutrition related symptoms reported.  Medications: reviewed  Labs: reviewed  Anthropometrics:   Weight has increased today to 189 lb, from 187 lb 9.6 oz on 5/1.   NUTRITION DIAGNOSIS: Unintentional weight loss improved   INTERVENTION:   Encouraged patient to continues to consume well balanced diet with good sources of calories and protein to prevent weight loss and improve nutrition    MONITORING, EVALUATION, GOAL: Patient will consume adequate calories and protein to minimize weight loss   NEXT VISIT: as needed  Billy Romero B. Zenia Resides, [place], [place] Registered Dietitian [phone] (pager)

## 2016-09-10 NOTE — Progress Notes (Signed)
[facility] OFFICE PROGRESS NOTE   Diagnosis: Esophagus cancer  INTERVAL HISTORY:   [name] returns as scheduled. He completed another cycle of FOLFOX on 08/27/2016. He reports Decadron helped with delayed nausea. He feels well. No dysphagia. He has bleeding from the right nostril intermittently. He has "hemorrhoid "bleeding when he is constipated. He reports cold sensitivity lasting 5 days after chemotherapy. No neuropathy symptoms at present. He is active working in his yard. The diplopia has almost completely resolved.  Objective:  Vital signs in last 24 hours:  Blood pressure 120/84, pulse 89, temperature 98.6 F (37 C), temperature source Oral, resp. rate 18, height 5' 11" (1.803 m), weight 189 lb 4.8 oz (85.9 kg), SpO2 100 %.    HEENT: No thrush or ulcers Resp: Lungs clear bilaterally Cardio: Regular rate and rhythm GI: No hepatosplenomegaly, nontender Vascular: No leg edema Neuro: The extraocular movements appear intact. Vibratory sense is intact at the fingertips bilaterally    Portacath/PICC-without erythema  Lab Results:  Lab Results  Component Value Date   WBC 3.3 (L) 09/10/2016   HGB 11.4 (L) 09/10/2016   HCT 33.0 (L) 09/10/2016   MCV 97.9 09/10/2016   PLT 110 (L) 09/10/2016   NEUTROABS 2.0 09/10/2016    CMP     Component Value Date/Time   NA 141 09/10/2016 0757   K 4.4 09/10/2016 0757   CL 105 06/14/2016 0339   CO2 24 09/10/2016 0757   GLUCOSE 118 09/10/2016 0757   BUN 14.0 09/10/2016 0757   CREATININE 0.8 09/10/2016 0757   CALCIUM 9.2 09/10/2016 0757   PROT 5.9 (L) 09/10/2016 0757   ALBUMIN 3.2 (L) 09/10/2016 0757   AST 26 09/10/2016 0757   ALT 43 09/10/2016 0757   ALKPHOS 110 09/10/2016 0757   BILITOT 0.43 09/10/2016 0757   GFRNONAA >60 06/14/2016 0339   GFRAA >60 06/14/2016 0339    No results found for: CEA1  Lab Results  Component Value Date   INR 1.07 06/11/2016    Imaging:  Ct Head W & Wo Contrast  Result  Date: 09/09/2016 CLINICAL DATA:  Metastatic esophageal cancer follow-up EXAM: CT HEAD WITHOUT AND WITH CONTRAST TECHNIQUE: Contiguous axial images were obtained from the base of the skull through the vertex without and with intravenous contrast CONTRAST:  < 75 mL > ISOVUE-300 IOPAMIDOL (ISOVUE-300) INJECTION 61% COMPARISON:  MRI head 04/30/2016 FINDINGS: Brain: Ventricle size normal. Mild atrophy. Negative for intracranial hemorrhage or acute infarct. No enhancing mass lesions in the brain Vascular: Negative for hyperdense vessel. Small developmental venous anomaly right cerebellum unchanged. 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 Multiple lytic areas of bony destruction are present bilaterally, right greater than left. There is extensive tumor in the sphenoid bone bilaterally right greater than left. Bony disease has progressed since the prior MRI. There is convexity tumor in the calvarium which has extended intracranially into the extradural space above the sagittal sinus. This appears similar to the prior study. Sinuses/Orbits: Enhancing tumor in the roof of the orbit on the right appears improved from the prior study. This is due to metastatic disease to the sphenoid bone and orbital roof. 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 Intracranial extension of tumor is noted in the right orbital roof which appears improved. There is also extradural extension of tumor due to bony disease in the midline convexity which is similar to the prior study. Electronically Signed   By: Franchot Gallo  M.D.   On: 09/09/2016 12:02   Ct Soft Tissue Neck W Contrast  Result Date: 09/09/2016 CLINICAL DATA:  Metastatic esophageal cancer. EXAM: CT NECK WITH CONTRAST TECHNIQUE: Multidetector CT imaging of the neck was performed using the standard protocol following the bolus administration of intravenous contrast. CONTRAST:  < 75 mL >  ISOVUE-300 IOPAMIDOL (ISOVUE-300) INJECTION 61% COMPARISON:  None. FINDINGS: Pharynx and larynx: Negative for pharyngeal mass or edema. Esophageal thickening is noted likely related to prior treatment with radiation therapy. Mild dilatation of the esophagus. Salivary glands: No inflammation, mass, or stone. Thyroid: Negative Lymph nodes: No enlarged lymph nodes in the neck. Vascular: Atherosclerotic disease of the carotid bifurcation bilaterally. Right-sided Port-A-Cath tip in the SVC Limited intracranial: See separate CT head report from today. Visualized orbits: See CT head report from earlier today Mastoids and visualized paranasal sinuses: Paranasal sinuses clear. Periapical lucency around right upper molar Skeleton: Cervical spine surgical fusion C5-6 and C6-7. No fracture or metastatic disease in the cervical spine. Upper chest: See chest CT from today. Other: None IMPRESSION: Negative for metastatic disease in the neck. Electronically Signed   By: Franchot Gallo M.D.   On: 09/09/2016 12:08   Ct Chest W Contrast  Result Date: 09/09/2016 CLINICAL DATA:  Esophageal cancer.  Lung metastasis. EXAM: CT CHEST WITH CONTRAST TECHNIQUE: Multidetector CT imaging of the chest was performed during intravenous contrast administration. CONTRAST:  <See Chart> ISOVUE-300 IOPAMIDOL (ISOVUE-300) INJECTION 61% COMPARISON:  06/13/2016 FINDINGS: Cardiovascular: No significant vascular findings. Normal heart size. No pericardial effusion. Mediastinum/Nodes: No axillary supraclavicular adenopathy. Port in the RIGHT chest wall. There is diffuse esophageal wall thickening. There is perihilar thickening which extends into the mediastinum with poor definition of fat planes. This perihilar thickening is decreased in prominence compared to prior. No measurable lymphadenopathy. No pericardial fluid. Lungs/Pleura: No suspicious pulmonary nodules. Mild subpleural thickening in lower lobes Upper Abdomen: Limited view of the liver,  kidneys, pancreas are unremarkable. Normal adrenal glands. Musculoskeletal: No aggressive osseous lesion. IMPRESSION: 1. No evidence of esophageal cancer progression in the thorax. 2. Bilateral hilar peribronchial thickening is decreased. 3. Stable esophageal wall thickening. Electronically Signed   By: Suzy Bouchard M.D.   On: 09/09/2016 13:29    Medications: I have reviewed the patient's current medications.  Assessment/Plan: 1. 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 Radiation completed 06/10/2014  PET scan 07/01/2014 revealed improvement in the hypermetabolic esophagus mass and mediastinal lymphadenopathy  upper endoscopy 04/20/2015 -mid esophagus with mild narrowing , 2 small nodules measuring approximate 5 mm -biopsy with scant and detached fragments with high-grade squamous dysplasia , brushings negative for malignant cells.  CT chest/abdomen/pelvis 05/25/2015 with development of bilateral pulmonary nodules; paratracheal and subcarinal nodes improved/similar; new small lower paraesophageal nodes which are indeterminate; esophageal wall thickening improved since the prior PET; no evidence of metastatic disease in the abdomen or pelvis.  PET scan 06/16/2015 revealed hypermetabolic lung nodules, hypermetabolic mediastinal nodes, hypermetabolism at the mid to distal esophagus has improved  bronchoscopy/EBUS on 07/04/2015 confirmed squamous cell carcinoma involving right lower lobe brushings, right bronchial washings, a level 10 R lymph node biopsy, and right lower lobe biopsy  PD-L1 no expression on the right lower lobe biopsy (less than 1%)  Cycle 1 Pembrolizumab  07/26/2015  Cycle 2 Pembrolizumab 08/16/2015  Cycle 3 Pembrolizumab 09/06/2015  Cycle 4 Pembrolizumab 09/27/2015  CT chest 10/11/2015-stable/decreased size of pulmonary nodules, enlargement of several mediastinal nodes  Cycle 5 Pembrolizumab 10/18/2015  Cycle 6 Pembrolizumab 11/08/2015   Cycle 7 Pembrolizumab08/05/2015  Cycle 8 Pembrolizumab08/23/2017  CT chest 01/15/2016-slight progression of mediastinal/upper abdominal lymphadenopathy and enlargement of a right lower lobe nodule  Cycle 9 Pembrolizumab 01/17/2016   Cycle 10 Pembrolizumab10/02/2016  Cycle 11 Pembrolizumab 02/28/2016  Cycle 12 Pembrolizumab 03/20/2016  CT chest 04/25/2016-minimal progression of chest lymphadenopathy and lung nodules, thickening at the mid thoracic esophagus  MRI brain 04/30/2016-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 with involvement of the right orbit, no brain metastases  Palliative radiation right sphenoid lesion beginning 05/02/2016  CT chest 06/13/2016-left lower lobe pulmonary embolus, increased mediastinal and hilar adenopathy, septal thickening and clustered nodules in the right lower lobe, gastrohepatic lymphadenopathy  Cycle 1 FOLFOX 06/24/2016  Cycle 2 FOLFOX 07/15/2016  Cycle 3 FOLFOX 07/29/2016  Cycle 4 FOLFOX 08/12/2016  Cycle 5 FOLFOX 08/27/2016  Restaging CTs of the head and chest on 09/09/2016-decreased hilar/mediastinal lymphadenopathy, decreased lung nodules, stable esophageal thickening. Decreased right orbital mass, progressive calvarial metastases compared to an MRI from 04/30/2016  Cycle 6 FOLFOX 09/10/2016  2. Solid/liquid dysphagia secondary to #1  Jejunostomy feeding tube placement 04/07/2014  Feeding tube removed 05/30/2014.  3. Small bowel obstruction following placement of the jejunostomy feeding tube-resolved  4. History of ulcerative colitis  5. Aspiration pneumonia December 2015  6. Diplopia secondary  to a metastasis at the right sphenoid involving the right orbit-status post palliative radiation-completed 05/15/2016, improved  7. Fever/cough/dyspnea 04/30/2016-nasal swab positive for influenza A , chest x-ray with a left lower lobe Pneumonia -treated with Tamiflu and Levaquin   8. Admission 06/13/2016 with a cough/left-sided chest pain/dyspnea/tachycardia/low-grade fever-pulmonary embolism confirmed on a chest CT, progressive chest adenopathy, inflammatory changes versus lymphatic tumor spread in the right lower lobe.Maintained on Xarelto.  9. Port-A-Cath placement 06/11/2016  10. Dysphagia. Barium swallow 05/01/2016 with circumferential narrowing of the midesophagus causing moderate degree of stricture which would not allow the 13 mm barium tablet to pass. The dysphagia improved spontaneously. He developed recurrent dysphagia to solids 06/15/2016. He is tolerating liquids.  11. Recurrent syncope events-borderline low cortisol level III/XII/MMXVIII, symptoms improved with hydrocortisone replacement  12. Hypercalcemia of malignancy-improved     Disposition:  Mr. Lizotte appears well. He has completed 5 cycles of FOLFOX. The restaging CTs confirmed improvement in the chest tumor burden. The right orbital mass has decreased in size. There has been progression of other calvarial lesions compared to when MRI from January. He did not begin FOLFOX until 06/24/2016. I reviewed the CT images with Mr. Heidrick and his wife.  The overall clinical and radiologic findings suggest a clinical response to FOLFOX. The plan is to continue FOLFOX. He will complete cycle 6 today. Mr. Bais will return for an office visit and chemotherapy in 2 weeks.  25 minutes were spent with the patient today. The majority of the time was used for counseling and coordination of care.  Betsy Coder, MD  09/10/2016  9:16 AM

## 2016-09-12 ENCOUNTER — Other Ambulatory Visit: Payer: Self-pay | Admitting: *Deleted

## 2016-09-12 ENCOUNTER — Telehealth: Payer: Self-pay | Admitting: *Deleted

## 2016-09-12 ENCOUNTER — Ambulatory Visit (HOSPITAL_BASED_OUTPATIENT_CLINIC_OR_DEPARTMENT_OTHER): Payer: BLUE CROSS/BLUE SHIELD

## 2016-09-12 VITALS — BP 111/75 | HR 78 | Temp 98.2°F | Resp 18

## 2016-09-12 DIAGNOSIS — Z452 Encounter for adjustment and management of vascular access device: Secondary | ICD-10-CM | POA: Diagnosis not present

## 2016-09-12 DIAGNOSIS — C154 Malignant neoplasm of middle third of esophagus: Secondary | ICD-10-CM

## 2016-09-12 DIAGNOSIS — C7801 Secondary malignant neoplasm of right lung: Secondary | ICD-10-CM

## 2016-09-12 DIAGNOSIS — C771 Secondary and unspecified malignant neoplasm of intrathoracic lymph nodes: Secondary | ICD-10-CM

## 2016-09-12 MED ORDER — HEPARIN SOD (PORK) LOCK FLUSH 100 UNIT/ML IV SOLN
500.0000 [IU] | Freq: Once | INTRAVENOUS | Status: AC | PRN
  Administered 2016-09-12: 500 [IU]
  Filled 2016-09-12: qty 5

## 2016-09-12 MED ORDER — SODIUM CHLORIDE 0.9% FLUSH
10.0000 mL | INTRAVENOUS | Status: DC | PRN
  Administered 2016-09-12: 10 mL
  Filled 2016-09-12: qty 10

## 2016-09-12 MED ORDER — DEXAMETHASONE 4 MG PO TABS: ORAL_TABLET | ORAL | 1 refills | Status: DC

## 2016-09-12 NOTE — Telephone Encounter (Signed)
Pt's wife is requesting a letter from Dr. Benay Spice stating she is unable to report for jury duty because she is his caregiver, and provides transportation. Jury duty date is 5/30 (pt's next visit and treatment date). Will review with MD.

## 2016-09-16 ENCOUNTER — Encounter: Payer: Self-pay | Admitting: Oncology

## 2016-09-16 NOTE — Telephone Encounter (Signed)
Called [name], informed her letter is ready to be picked up.

## 2016-09-23 ENCOUNTER — Other Ambulatory Visit: Payer: Self-pay | Admitting: Oncology

## 2016-09-23 DIAGNOSIS — C154 Malignant neoplasm of middle third of esophagus: Secondary | ICD-10-CM

## 2016-09-25 ENCOUNTER — Ambulatory Visit (HOSPITAL_BASED_OUTPATIENT_CLINIC_OR_DEPARTMENT_OTHER): Payer: BLUE CROSS/BLUE SHIELD

## 2016-09-25 ENCOUNTER — Other Ambulatory Visit (HOSPITAL_BASED_OUTPATIENT_CLINIC_OR_DEPARTMENT_OTHER): Payer: BLUE CROSS/BLUE SHIELD

## 2016-09-25 ENCOUNTER — Ambulatory Visit (HOSPITAL_BASED_OUTPATIENT_CLINIC_OR_DEPARTMENT_OTHER): Payer: BLUE CROSS/BLUE SHIELD | Admitting: Nurse Practitioner

## 2016-09-25 ENCOUNTER — Ambulatory Visit: Payer: BLUE CROSS/BLUE SHIELD

## 2016-09-25 VITALS — BP 114/77 | HR 86 | Temp 98.6°F | Resp 18 | Ht 71.0 in | Wt 193.0 lb

## 2016-09-25 DIAGNOSIS — C771 Secondary and unspecified malignant neoplasm of intrathoracic lymph nodes: Secondary | ICD-10-CM

## 2016-09-25 DIAGNOSIS — C154 Malignant neoplasm of middle third of esophagus: Secondary | ICD-10-CM

## 2016-09-25 DIAGNOSIS — C7951 Secondary malignant neoplasm of bone: Secondary | ICD-10-CM

## 2016-09-25 DIAGNOSIS — R200 Anesthesia of skin: Secondary | ICD-10-CM

## 2016-09-25 DIAGNOSIS — D3160 Benign neoplasm of unspecified site of unspecified orbit: Secondary | ICD-10-CM | POA: Diagnosis not present

## 2016-09-25 DIAGNOSIS — Z95828 Presence of other vascular implants and grafts: Secondary | ICD-10-CM

## 2016-09-25 DIAGNOSIS — Z5111 Encounter for antineoplastic chemotherapy: Secondary | ICD-10-CM | POA: Diagnosis not present

## 2016-09-25 DIAGNOSIS — C7801 Secondary malignant neoplasm of right lung: Secondary | ICD-10-CM | POA: Diagnosis not present

## 2016-09-25 DIAGNOSIS — D709 Neutropenia, unspecified: Secondary | ICD-10-CM | POA: Diagnosis not present

## 2016-09-25 LAB — COMPREHENSIVE METABOLIC PANEL
ALT: 45 U/L (ref 0–55)
AST: 26 U/L (ref 5–34)
Albumin: 3.2 g/dL — ABNORMAL LOW (ref 3.5–5.0)
Alkaline Phosphatase: 95 U/L (ref 40–150)
Anion Gap: 7 mEq/L (ref 3–11)
BUN: 14.6 mg/dL (ref 7.0–26.0)
CO2: 28 meq/L (ref 22–29)
Calcium: 9.5 mg/dL (ref 8.4–10.4)
Chloride: 107 mEq/L (ref 98–109)
Creatinine: 0.8 mg/dL (ref 0.7–1.3)
GLUCOSE: 94 mg/dL (ref 70–140)
POTASSIUM: 3.8 meq/L (ref 3.5–5.1)
SODIUM: 143 meq/L (ref 136–145)
Total Bilirubin: 0.43 mg/dL (ref 0.20–1.20)
Total Protein: 5.7 g/dL — ABNORMAL LOW (ref 6.4–8.3)

## 2016-09-25 LAB — CBC WITH DIFFERENTIAL/PLATELET
BASO%: 0.4 % (ref 0.0–2.0)
Basophils Absolute: 0 10*3/uL (ref 0.0–0.1)
EOS ABS: 0 10*3/uL (ref 0.0–0.5)
EOS%: 0 % (ref 0.0–7.0)
HCT: 31.1 % — ABNORMAL LOW (ref 38.4–49.9)
HGB: 10.6 g/dL — ABNORMAL LOW (ref 13.0–17.1)
LYMPH%: 28.7 % (ref 14.0–49.0)
MCH: 33.9 pg — AB (ref 27.2–33.4)
MCHC: 34.1 g/dL (ref 32.0–36.0)
MCV: 99.4 fL — AB (ref 79.3–98.0)
MONO#: 0.6 10*3/uL (ref 0.1–0.9)
MONO%: 23.9 % — AB (ref 0.0–14.0)
NEUT%: 47 % (ref 39.0–75.0)
NEUTROS ABS: 1.3 10*3/uL — AB (ref 1.5–6.5)
Platelets: 121 10*3/uL — ABNORMAL LOW (ref 140–400)
RBC: 3.13 10*6/uL — AB (ref 4.20–5.82)
RDW: 18.9 % — ABNORMAL HIGH (ref 11.0–14.6)
WBC: 2.7 10*3/uL — AB (ref 4.0–10.3)
lymph#: 0.8 10*3/uL — ABNORMAL LOW (ref 0.9–3.3)

## 2016-09-25 MED ORDER — OXALIPLATIN CHEMO INJECTION 100 MG/20ML
85.0000 mg/m2 | Freq: Once | INTRAVENOUS | Status: AC
  Administered 2016-09-25: 180 mg via INTRAVENOUS
  Filled 2016-09-25: qty 36

## 2016-09-25 MED ORDER — PALONOSETRON HCL INJECTION 0.25 MG/5ML
0.2500 mg | Freq: Once | INTRAVENOUS | Status: AC
  Administered 2016-09-25: 0.25 mg via INTRAVENOUS

## 2016-09-25 MED ORDER — PALONOSETRON HCL INJECTION 0.25 MG/5ML
INTRAVENOUS | Status: AC
  Filled 2016-09-25: qty 5

## 2016-09-25 MED ORDER — SODIUM CHLORIDE 0.9% FLUSH
10.0000 mL | INTRAVENOUS | Status: DC | PRN
  Administered 2016-09-25: 10 mL via INTRAVENOUS
  Filled 2016-09-25: qty 10

## 2016-09-25 MED ORDER — DEXTROSE 5 % IV SOLN
Freq: Once | INTRAVENOUS | Status: AC
  Administered 2016-09-25: 11:00:00 via INTRAVENOUS

## 2016-09-25 MED ORDER — LEUCOVORIN CALCIUM INJECTION 350 MG
400.0000 mg/m2 | Freq: Once | INTRAVENOUS | Status: AC
  Administered 2016-09-25: 848 mg via INTRAVENOUS
  Filled 2016-09-25: qty 42.4

## 2016-09-25 MED ORDER — SODIUM CHLORIDE 0.9 % IV SOLN
Freq: Once | INTRAVENOUS | Status: AC
  Administered 2016-09-25: 11:00:00 via INTRAVENOUS
  Filled 2016-09-25: qty 5

## 2016-09-25 MED ORDER — FLUOROURACIL CHEMO INJECTION 2.5 GM/50ML
400.0000 mg/m2 | Freq: Once | INTRAVENOUS | Status: AC
  Administered 2016-09-25: 850 mg via INTRAVENOUS
  Filled 2016-09-25: qty 17

## 2016-09-25 MED ORDER — SODIUM CHLORIDE 0.9 % IV SOLN
2400.0000 mg/m2 | INTRAVENOUS | Status: DC
  Administered 2016-09-25: 5100 mg via INTRAVENOUS
  Filled 2016-09-25: qty 102

## 2016-09-25 NOTE — Progress Notes (Signed)
[facility] OFFICE PROGRESS NOTE   Diagnosis:  Esophagus cancer  INTERVAL HISTORY:   Billy Romero returns as scheduled. He completed cycle 6 FOLFOX 09/10/2016. He denies nausea/vomiting. No mouth sores. He had throat pain and "sinus drainage" for several days. He was able to eat and drink without difficulty. No diarrhea. Cold sensitivity lasted 5 or 6 days. He has intermittent mild numbness/tingling in the fingertips. He had jaw pain lasting 5-6 days.  Objective:  Vital signs in last 24 hours:  Blood pressure 114/77, pulse 86, temperature 98.6 F (37 C), temperature source Oral, resp. rate 18, height 5' 11"  (1.803 m), weight 193 lb (87.5 kg), SpO2 99 %.    HEENT: No thrush or ulcers. Resp: Lungs clear bilaterally. Cardio: Regular rate and rhythm. GI: Abdomen soft and nontender. No hepatomegaly. Vascular: No leg edema. Neuro: Alert and oriented.   Porta cath without erythema.  Lab Results:  Lab Results  Component Value Date   WBC 2.7 (L) 09/25/2016   HGB 10.6 (L) 09/25/2016   HCT 31.1 (L) 09/25/2016   MCV 99.4 (H) 09/25/2016   PLT 121 (L) 09/25/2016   NEUTROABS 1.3 (L) 09/25/2016    Imaging:  No results found.  Medications: I have reviewed the patient's current medications.  Assessment/Plan: 1. 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 Radiation completed 06/10/2014  PET scan 07/01/2014 revealed improvement in the hypermetabolic esophagus mass and mediastinal lymphadenopathy  upper endoscopy 04/20/2015 -mid esophagus with mild narrowing , 2 small nodules measuring approximate 5 mm -biopsy with scant and detached fragments with  high-grade squamous dysplasia , brushings negative for malignant cells.  CT chest/abdomen/pelvis 05/25/2015 with development of bilateral pulmonary nodules; paratracheal and subcarinal nodes improved/similar; new small lower paraesophageal nodes which are indeterminate; esophageal wall thickening improved since the prior PET; no evidence of metastatic disease in the abdomen or pelvis.  PET scan 06/16/2015 revealed hypermetabolic lung nodules, hypermetabolic mediastinal nodes, hypermetabolism at the mid to distal esophagus has improved  bronchoscopy/EBUS on 07/04/2015 confirmed squamous cell carcinoma involving right lower lobe brushings, right bronchial washings, a level 10 R lymph node biopsy, and right lower lobe biopsy  PD-L1 no expression on the right lower lobe biopsy (less than 1%)  Cycle 1 Pembrolizumab 07/26/2015  Cycle 2 Pembrolizumab 08/16/2015  Cycle 3 Pembrolizumab 09/06/2015  Cycle 4 Pembrolizumab 09/27/2015  CT chest 10/11/2015-stable/decreased size of pulmonary nodules, enlargement of several mediastinal nodes  Cycle 5 Pembrolizumab 10/18/2015  Cycle 6 Pembrolizumab 11/08/2015   Cycle 7 Pembrolizumab08/05/2015  Cycle 8 Pembrolizumab08/23/2017  CT chest 01/15/2016-slight progression of mediastinal/upper abdominal lymphadenopathy and enlargement of a right lower lobe nodule  Cycle 9 Pembrolizumab 01/17/2016   Cycle 10 Pembrolizumab10/02/2016  Cycle 11 Pembrolizumab 02/28/2016  Cycle 12 Pembrolizumab 03/20/2016  CT chest 04/25/2016-minimal progression of chest lymphadenopathy and lung nodules, thickening at the mid thoracic esophagus  MRI brain 04/30/2016-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 with involvement of the right orbit, no brain metastases  Palliative radiation right sphenoid lesion beginning 05/02/2016  CT chest 06/13/2016-left lower lobe pulmonary embolus, increased mediastinal and hilar adenopathy, septal  thickening and clustered nodules in the right lower lobe, gastrohepatic lymphadenopathy  Cycle 1 FOLFOX 06/24/2016  Cycle 2 FOLFOX 07/15/2016  Cycle 3 FOLFOX 07/29/2016  Cycle 4 FOLFOX 08/12/2016  Cycle 5 FOLFOX 08/27/2016  Restaging CTs of the head and chest on 09/09/2016-decreased hilar/mediastinal lymphadenopathy, decreased lung nodules, stable esophageal thickening. Decreased right orbital mass, progressive calvarial metastases compared to an MRI from 04/30/2016  Cycle 6 FOLFOX 09/10/2016  Cycle 7 FOLFOX 09/25/2016  2. Solid/liquid dysphagia secondary to #1  Jejunostomy feeding tube placement 04/07/2014  Feeding tube removed 05/30/2014.  3. Small bowel obstruction following placement of the jejunostomy feeding tube-resolved  4. History of ulcerative colitis  5. Aspiration pneumonia December 2015  6. 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 Fever/cough/dyspnea 04/30/2016-nasal swab positive for influenza A , chest x-ray with a left lower lobe Pneumonia -treated with Tamiflu and Levaquin   8. Admission 06/13/2016 with a cough/left-sided chest pain/dyspnea/tachycardia/low-grade fever-pulmonary embolism confirmed on a chest CT, progressive chest adenopathy, inflammatory changes versus lymphatic tumor spread in the right lower lobe.Maintained on Xarelto.  9. Port-A-Cath placement 06/11/2016  10. Dysphagia. Barium swallow 05/01/2016 with circumferential narrowing of the midesophagus causing moderate degree of stricture which would not allow the 13 mm barium tablet to pass. The dysphagia improved spontaneously. He developed recurrent dysphagia to solids 06/15/2016. He is tolerating liquids.  11. Recurrent syncope events-borderline low cortisol level III/XII/MMXVIII, symptoms improved with hydrocortisone replacement  12. Hypercalcemia of  malignancy-improved    Disposition: [name] appears stable. He has completed 6 cycles of FOLFOX. Plan to proceed with cycle 7 today as scheduled.  We reviewed the CBC from today. He has mild neutropenia. He will receive Neulasta on the day of pump discontinuation. Potential toxicities associated with Neulasta were reviewed including bone pain, rash, splenic rupture. He is agreeable to proceed. He understands to contact the office with fever, chills, other signs of infection.  He will return for a follow-up visit and cycle 8 FOLFOX in 2 weeks. He will contact the office in the interim with any problems.  Plan reviewed with Dr. Benay Spice.    Ned Card ANP/GNP-BC   09/25/2016  11:02 AM

## 2016-09-25 NOTE — Progress Notes (Signed)
Ok to treat with ANC 1.3 per Ned Card, NP.

## 2016-09-25 NOTE — Patient Instructions (Signed)

## 2016-09-25 NOTE — Patient Instructions (Signed)
[facility] Discharge Instructions for Patients Receiving Chemotherapy  Today you received the following chemotherapy agents Oxaliplatin/Leucovorin/5 FU  To help prevent nausea and vomiting after your treatment, we encourage you to take your nausea medication as prescribed.   If you develop nausea and vomiting that is not controlled by your nausea medication, call the clinic.   BELOW ARE SYMPTOMS THAT SHOULD BE REPORTED IMMEDIATELY:  *FEVER GREATER THAN 100.5 F  *CHILLS WITH OR WITHOUT FEVER  NAUSEA AND VOMITING THAT IS NOT CONTROLLED WITH YOUR NAUSEA MEDICATION  *UNUSUAL SHORTNESS OF BREATH  *UNUSUAL BRUISING OR BLEEDING  TENDERNESS IN MOUTH AND THROAT WITH OR WITHOUT PRESENCE OF ULCERS  *URINARY PROBLEMS  *BOWEL PROBLEMS  UNUSUAL RASH Items with * indicate a potential emergency and should be followed up as soon as possible.  Feel free to call the clinic you have any questions or concerns. The clinic phone number is [phone].  Please show the [facility] at check-in to the Emergency Department and triage nurse.

## 2016-09-27 ENCOUNTER — Ambulatory Visit (HOSPITAL_BASED_OUTPATIENT_CLINIC_OR_DEPARTMENT_OTHER): Payer: BLUE CROSS/BLUE SHIELD

## 2016-09-27 VITALS — BP 121/84 | HR 85 | Resp 18

## 2016-09-27 DIAGNOSIS — C154 Malignant neoplasm of middle third of esophagus: Secondary | ICD-10-CM | POA: Diagnosis not present

## 2016-09-27 DIAGNOSIS — C7951 Secondary malignant neoplasm of bone: Secondary | ICD-10-CM

## 2016-09-27 DIAGNOSIS — C7801 Secondary malignant neoplasm of right lung: Secondary | ICD-10-CM

## 2016-09-27 DIAGNOSIS — C771 Secondary and unspecified malignant neoplasm of intrathoracic lymph nodes: Secondary | ICD-10-CM | POA: Diagnosis not present

## 2016-09-27 MED ORDER — HEPARIN SOD (PORK) LOCK FLUSH 100 UNIT/ML IV SOLN
500.0000 [IU] | Freq: Once | INTRAVENOUS | Status: AC | PRN
  Administered 2016-09-27: 500 [IU]
  Filled 2016-09-27: qty 5

## 2016-09-27 MED ORDER — SODIUM CHLORIDE 0.9% FLUSH
10.0000 mL | INTRAVENOUS | Status: DC | PRN
  Administered 2016-09-27: 10 mL
  Filled 2016-09-27: qty 10

## 2016-09-30 ENCOUNTER — Telehealth: Payer: Self-pay | Admitting: Oncology

## 2016-09-30 ENCOUNTER — Other Ambulatory Visit: Payer: Self-pay | Admitting: *Deleted

## 2016-09-30 NOTE — Telephone Encounter (Signed)
Patient bypassed scheduling on 09/26/16. Per Vanessa/desk nurse, Injection was not approved by [employer]. Injection appointment pending Insurance approval. 09/30/16

## 2016-10-06 ENCOUNTER — Other Ambulatory Visit: Payer: Self-pay | Admitting: Oncology

## 2016-10-08 ENCOUNTER — Ambulatory Visit: Payer: BLUE CROSS/BLUE SHIELD

## 2016-10-08 ENCOUNTER — Other Ambulatory Visit (HOSPITAL_BASED_OUTPATIENT_CLINIC_OR_DEPARTMENT_OTHER): Payer: BLUE CROSS/BLUE SHIELD

## 2016-10-08 ENCOUNTER — Ambulatory Visit (HOSPITAL_BASED_OUTPATIENT_CLINIC_OR_DEPARTMENT_OTHER): Payer: BLUE CROSS/BLUE SHIELD | Admitting: Oncology

## 2016-10-08 VITALS — BP 114/77 | HR 92 | Temp 98.4°F | Resp 18 | Ht 71.0 in | Wt 194.3 lb

## 2016-10-08 DIAGNOSIS — C7801 Secondary malignant neoplasm of right lung: Secondary | ICD-10-CM

## 2016-10-08 DIAGNOSIS — C154 Malignant neoplasm of middle third of esophagus: Secondary | ICD-10-CM

## 2016-10-08 DIAGNOSIS — H532 Diplopia: Secondary | ICD-10-CM | POA: Diagnosis not present

## 2016-10-08 DIAGNOSIS — C771 Secondary and unspecified malignant neoplasm of intrathoracic lymph nodes: Secondary | ICD-10-CM | POA: Diagnosis not present

## 2016-10-08 DIAGNOSIS — C7951 Secondary malignant neoplasm of bone: Secondary | ICD-10-CM

## 2016-10-08 DIAGNOSIS — G620 Drug-induced polyneuropathy: Secondary | ICD-10-CM | POA: Diagnosis not present

## 2016-10-08 DIAGNOSIS — Z95828 Presence of other vascular implants and grafts: Secondary | ICD-10-CM

## 2016-10-08 DIAGNOSIS — C7949 Secondary malignant neoplasm of other parts of nervous system: Secondary | ICD-10-CM | POA: Diagnosis not present

## 2016-10-08 LAB — CBC WITH DIFFERENTIAL/PLATELET
BASO%: 0.5 % (ref 0.0–2.0)
Basophils Absolute: 0 10*3/uL (ref 0.0–0.1)
EOS%: 0 % (ref 0.0–7.0)
Eosinophils Absolute: 0 10*3/uL (ref 0.0–0.5)
HCT: 32.9 % — ABNORMAL LOW (ref 38.4–49.9)
HGB: 11.4 g/dL — ABNORMAL LOW (ref 13.0–17.1)
LYMPH%: 21.1 % (ref 14.0–49.0)
MCH: 35.4 pg — AB (ref 27.2–33.4)
MCHC: 34.8 g/dL (ref 32.0–36.0)
MCV: 101.8 fL — ABNORMAL HIGH (ref 79.3–98.0)
MONO#: 0.8 10*3/uL (ref 0.1–0.9)
MONO%: 17.1 % — AB (ref 0.0–14.0)
NEUT#: 2.7 10*3/uL (ref 1.5–6.5)
NEUT%: 61.3 % (ref 39.0–75.0)
Platelets: 118 10*3/uL — ABNORMAL LOW (ref 140–400)
RBC: 3.23 10*6/uL — ABNORMAL LOW (ref 4.20–5.82)
RDW: 19.6 % — ABNORMAL HIGH (ref 11.0–14.6)
WBC: 4.5 10*3/uL (ref 4.0–10.3)
lymph#: 0.9 10*3/uL (ref 0.9–3.3)

## 2016-10-08 LAB — COMPREHENSIVE METABOLIC PANEL
ALT: 42 U/L (ref 0–55)
AST: 29 U/L (ref 5–34)
Albumin: 3.3 g/dL — ABNORMAL LOW (ref 3.5–5.0)
Alkaline Phosphatase: 101 U/L (ref 40–150)
Anion Gap: 7 mEq/L (ref 3–11)
BUN: 14.1 mg/dL (ref 7.0–26.0)
CHLORIDE: 105 meq/L (ref 98–109)
CO2: 29 mEq/L (ref 22–29)
Calcium: 9.6 mg/dL (ref 8.4–10.4)
Creatinine: 0.8 mg/dL (ref 0.7–1.3)
EGFR: >90 mL/min/{1.73_m2} (ref >90)
GLUCOSE: 102 mg/dL (ref 70–140)
POTASSIUM: 4.1 meq/L (ref 3.5–5.1)
SODIUM: 141 meq/L (ref 136–145)
Total Bilirubin: 0.5 mg/dL (ref 0.20–1.20)
Total Protein: 6.1 g/dL — ABNORMAL LOW (ref 6.4–8.3)

## 2016-10-08 MED ORDER — HEPARIN SOD (PORK) LOCK FLUSH 100 UNIT/ML IV SOLN
500.0000 [IU] | Freq: Once | INTRAVENOUS | Status: AC | PRN
  Administered 2016-10-08: 500 [IU] via INTRAVENOUS
  Filled 2016-10-08: qty 5

## 2016-10-08 MED ORDER — SODIUM CHLORIDE 0.9% FLUSH
10.0000 mL | INTRAVENOUS | Status: DC | PRN
  Administered 2016-10-08: 10 mL via INTRAVENOUS
  Filled 2016-10-08: qty 10

## 2016-10-08 MED ORDER — CAPECITABINE 500 MG PO TABS: ORAL_TABLET | ORAL | 0 refills | Status: DC

## 2016-10-08 MED ORDER — PROMETHAZINE HCL 25 MG PO TABS: 25.0000 mg | ORAL_TABLET | Freq: Four times a day (QID) | ORAL | 0 refills | Status: DC | PRN

## 2016-10-08 NOTE — Patient Instructions (Signed)
Implanted Port Home Guide An implanted port is a type of central line that is placed under the skin. Central lines are used to provide IV access when treatment or nutrition needs to be given through a person's veins. Implanted ports are used for long-term IV access. An implanted port may be placed because:  You need IV medicine that would be irritating to the small veins in your hands or arms.  You need long-term IV medicines, such as antibiotics.  You need IV nutrition for a long period.  You need frequent blood draws for lab tests.  You need dialysis.  Implanted ports are usually placed in the chest area, but they can also be placed in the upper arm, the abdomen, or the leg. An implanted port has two main parts:  Reservoir. The reservoir is round and will appear as a small, raised area under your skin. The reservoir is the part where a needle is inserted to give medicines or draw blood.  Catheter. The catheter is a thin, flexible tube that extends from the reservoir. The catheter is placed into a large vein. Medicine that is inserted into the reservoir goes into the catheter and then into the vein.  How will I care for my incision site? Do not get the incision site wet. Bathe or shower as directed by your health care provider. How is my port accessed? Special steps must be taken to access the port:  Before the port is accessed, a numbing cream can be placed on the skin. This helps numb the skin over the port site.  Your health care provider uses a sterile technique to access the port. ? Your health care provider must put on a mask and sterile gloves. ? The skin over your port is cleaned carefully with an antiseptic and allowed to dry. ? The port is gently pinched between sterile gloves, and a needle is inserted into the port.  Only "non-coring" port needles should be used to access the port. Once the port is accessed, a blood return should be checked. This helps ensure that the port  is in the vein and is not clogged.  If your port needs to remain accessed for a constant infusion, a clear (transparent) bandage will be placed over the needle site. The bandage and needle will need to be changed every week, or as directed by your health care provider.  Keep the bandage covering the needle clean and dry. Do not get it wet. Follow your health care provider's instructions on how to take a shower or bath while the port is accessed.  If your port does not need to stay accessed, no bandage is needed over the port.  What is flushing? Flushing helps keep the port from getting clogged. Follow your health care provider's instructions on how and when to flush the port. Ports are usually flushed with saline solution or a medicine called heparin. The need for flushing will depend on how the port is used.  If the port is used for intermittent medicines or blood draws, the port will need to be flushed: ? After medicines have been given. ? After blood has been drawn. ? As part of routine maintenance.  If a constant infusion is running, the port may not need to be flushed.  How long will my port stay implanted? The port can stay in for as long as your health care provider thinks it is needed. When it is time for the port to come out, surgery will be   done to remove it. The procedure is similar to the one performed when the port was put in. When should I seek immediate medical care? When you have an implanted port, you should seek immediate medical care if:  You notice a bad smell coming from the incision site.  You have swelling, redness, or drainage at the incision site.  You have more swelling or pain at the port site or the surrounding area.  You have a fever that is not controlled with medicine.  This information is not intended to replace advice given to you by your health care provider. Make sure you discuss any questions you have with your health care provider. Document  Released: 04/15/2005 Document Revised: 09/21/2015 Document Reviewed: 12/21/2012 Elsevier Interactive Patient Education  2017 [employer]  

## 2016-10-08 NOTE — Addendum Note (Signed)
Addended by: Jethro Bolus A on: 10/08/2016 04:23 PM   Modules accepted: Orders

## 2016-10-08 NOTE — Progress Notes (Signed)
[place] OFFICE PROGRESS NOTE   Diagnosis: Esophagus cancer  INTERVAL HISTORY:   Billy Romero returns as scheduled. He completed another cycle of FOLFOX 09/25/2016. Neulasta was recommended following chemotherapy, but his insurance company would not approve the Neulasta. He reports malaise following this cycle of chemotherapy. He had upper respiratory symptoms including sinus drainage and a cough. He had some blood in the drainage. These symptoms have improved. He reports persistent cold sensitivity. He has numbness in the hands and feet. He has dropped objects. No dysphagia. He developed cracking at the angle of the lips.  Objective:  Vital signs in last 24 hours:  Blood pressure 114/77, pulse 92, temperature 98.4 F (36.9 C), temperature source Oral, resp. rate 18, height 5' 11"  (1.803 m), weight 194 lb 4.8 oz (88.1 kg), SpO2 100 %.    HEENT: No thrush or ulcers Resp: Lungs clear bilaterally Cardio: Regular rate and rhythm GI: No hepatosplenomegaly Vascular: No leg edema Neuro: Moderate loss of vibratory sense at the fingertips bilaterally  Skin: Hands without erythema   Portacath/PICC-without erythema  Lab Results:  Lab Results  Component Value Date   WBC 4.5 10/08/2016   HGB 11.4 (L) 10/08/2016   HCT 32.9 (L) 10/08/2016   MCV 101.8 (H) 10/08/2016   PLT 118 (L) 10/08/2016   NEUTROABS 2.7 10/08/2016    CMP     Component Value Date/Time   NA 141 10/08/2016 0939   K 4.1 10/08/2016 0939   CL 105 06/14/2016 0339   CO2 29 10/08/2016 0939   GLUCOSE 102 10/08/2016 0939   BUN 14.1 10/08/2016 0939   CREATININE 0.8 10/08/2016 0939   CALCIUM 9.6 10/08/2016 0939   PROT 6.1 (L) 10/08/2016 0939   ALBUMIN 3.3 (L) 10/08/2016 0939   AST 29 10/08/2016 0939   ALT 42 10/08/2016 0939   ALKPHOS 101 10/08/2016 0939   BILITOT 0.50 10/08/2016 0939   GFRNONAA >60 06/14/2016 0339   GFRAA >60 06/14/2016 0339    Medications: I have reviewed the patient's current  medications.  Assessment/Plan: 1. 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 Radiation completed 06/10/2014  PET scan 07/01/2014 revealed improvement in the hypermetabolic esophagus mass and mediastinal lymphadenopathy  upper endoscopy 04/20/2015 -mid esophagus with mild narrowing , 2 small nodules measuring approximate 5 mm -biopsy with scant and detached fragments with high-grade squamous dysplasia , brushings negative for malignant cells.  CT chest/abdomen/pelvis 05/25/2015 with development of bilateral pulmonary nodules; paratracheal and subcarinal nodes improved/similar; new small lower paraesophageal nodes which are indeterminate; esophageal wall thickening improved since the prior PET; no evidence of metastatic disease in the abdomen or pelvis.  PET scan 06/16/2015 revealed hypermetabolic lung nodules, hypermetabolic mediastinal nodes, hypermetabolism at the mid to distal esophagus has improved  bronchoscopy/EBUS on 07/04/2015 confirmed squamous cell carcinoma involving right lower lobe brushings, right bronchial washings, a level 10 R lymph node biopsy, and right lower lobe biopsy  PD-L1 no expression on the right lower lobe biopsy (less than 1%)  Cycle 1 Pembrolizumab 07/26/2015  Cycle 2 Pembrolizumab 08/16/2015  Cycle 3 Pembrolizumab 09/06/2015  Cycle 4 Pembrolizumab 09/27/2015  CT chest 10/11/2015-stable/decreased size of pulmonary nodules, enlargement of several mediastinal nodes  Cycle 5 Pembrolizumab 10/18/2015  Cycle 6 Pembrolizumab 11/08/2015   Cycle  7 Pembrolizumab08/05/2015  Cycle 8 Pembrolizumab08/23/2017  CT chest 01/15/2016-slight  progression of mediastinal/upper abdominal lymphadenopathy and enlargement of a right lower lobe nodule  Cycle 9 Pembrolizumab 01/17/2016   Cycle 10 Pembrolizumab10/02/2016  Cycle 11 Pembrolizumab 02/28/2016  Cycle 12 Pembrolizumab 03/20/2016  CT chest 04/25/2016-minimal progression of chest lymphadenopathy and lung nodules, thickening at the mid thoracic esophagus  MRI brain 04/30/2016-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 with involvement of the right orbit, no brain metastases  Palliative radiation right sphenoid lesion beginning 05/02/2016  CT chest 06/13/2016-left lower lobe pulmonary embolus, increased mediastinal and hilar adenopathy, septal thickening and clustered nodules in the right lower lobe, gastrohepatic lymphadenopathy  Cycle 1 FOLFOX 06/24/2016  Cycle 2 FOLFOX 07/15/2016  Cycle 3 FOLFOX 07/29/2016  Cycle 4 FOLFOX 08/12/2016  Cycle 5 FOLFOX 08/27/2016  Restaging CTs of the head and chest on 09/09/2016-decreased hilar/mediastinal lymphadenopathy, decreased lung nodules, stable esophageal thickening. Decreased right orbital mass, progressive calvarial metastases compared to an MRI from 04/30/2016  Cycle 6 FOLFOX 09/10/2016  Cycle 7 FOLFOX 09/25/2016  2. Solid/liquid dysphagia secondary to #1  Jejunostomy feeding tube placement 04/07/2014  Feeding tube removed 05/30/2014.  3. Small bowel obstruction following placement of the jejunostomy feeding tube-resolved  4. History of ulcerative colitis  5. Aspiration pneumonia December 2015  6. 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 Fever/cough/dyspnea 04/30/2016-nasal swab positive for influenza A , chest x-ray with a left lower lobe Pneumonia -treated with Tamiflu and Levaquin   8. Admission 06/13/2016 with a cough/left-sided chest  pain/dyspnea/tachycardia/low-grade fever-pulmonary embolism confirmed on a chest CT, progressive chest adenopathy, inflammatory changes versus lymphatic tumor spread in the right lower lobe.Maintained on Xarelto.  9. Port-A-Cath placement 06/11/2016  10. Dysphagia. Barium swallow 05/01/2016 with circumferential narrowing of the midesophagus causing moderate degree of stricture which would not allow the 13 mm barium tablet to pass. The dysphagia improved spontaneously. He developed recurrent dysphagia to solids 06/15/2016. He is tolerating liquids.  11. Recurrent syncope events-borderline low cortisol level III/XII/MMXVIII, symptoms improved with hydrocortisone replacement  12. 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 Bebout has completed 7 cycles of FOLFOX. He has developed progressive oxaliplatin neuropathy. This now interferes with activity. We decided to discontinue oxaliplatin. He will change to a "maintenance "Xeloda schedule to be taken 7 days on/7 days off.  We reviewed the potential toxicities associated with Xeloda including the chance for hand/foot syndrome, diarrhea, and sun sensitivity. He agrees to proceed. The plan is to begin Xeloda 11/13/2016. He will return for an office and lab visit on 11/05/2016.  We will plan for a restaging CT evaluation after approximately 2 months of Xeloda.  25 minutes were spent with the patient today. The majority of the time was used for counseling and coordination of care.  Donneta Romberg, MD  10/08/2016  11:29 AM

## 2016-10-08 NOTE — Progress Notes (Signed)
Hand written prescriptions given to the patient during visit for Xeloda.

## 2016-10-11 ENCOUNTER — Telehealth: Payer: Self-pay | Admitting: Pharmacist

## 2016-10-11 ENCOUNTER — Other Ambulatory Visit: Payer: Self-pay | Admitting: *Deleted

## 2016-10-11 DIAGNOSIS — C154 Malignant neoplasm of middle third of esophagus: Secondary | ICD-10-CM

## 2016-10-11 MED ORDER — CAPECITABINE 500 MG PO TABS: ORAL_TABLET | ORAL | 0 refills | Status: DC

## 2016-10-11 MED ORDER — RIVAROXABAN 20 MG PO TABS: 20.0000 mg | ORAL_TABLET | Freq: Every day | ORAL | 1 refills | Status: DC

## 2016-10-11 NOTE — Telephone Encounter (Signed)
Call from pt's wife requesting Xarelto refill. She had requested through pharmacy but they hadn't heard back. Noted it was sent to [facility] in-basket.  Script refilled, per Dr. Benay Spice.

## 2016-10-11 NOTE — Telephone Encounter (Signed)
Oral Chemotherapy Pharmacist Encounter  Received new Xeloda prescription for the treatment of metastatic esophageal cancer 10/08/16 labs reviewed, OK for treatment Current medication list in Epic assessed, no significant DDIs with Xeloda identified.  Noted patient with [employer], which will require specialty pharmacy. Prescription has been e-scribed to [facility] [place], [place] (ph: [phone]).  Oral Oncology Clinic will continue to follow  Johny Drilling, PharmD, BCPS, BCOP 10/11/2016  4:09 PM Oral Oncology Clinic [phone]

## 2016-10-14 ENCOUNTER — Telehealth: Payer: Self-pay | Admitting: Oncology

## 2016-10-14 NOTE — Telephone Encounter (Signed)
Patient wife called to get his next appointment on 7/10.  There isn ot appointments on that day want not sure if could see Lattie Haw

## 2016-10-14 NOTE — Telephone Encounter (Signed)
Patient bypassed scheduling on 10/08/16. Appointments scheduled and confirmed with patient per 10/08/16 los.

## 2016-10-14 NOTE — Telephone Encounter (Signed)
Telephone call to wife to answer questions. She would like to have the appointment rescheduled to 7/10 as Dr. Benay Spice directed in his last OV. Patient given Oral Chemo Clinic contact information to inquire further about Xeloda. Pt has been advised it is in the prior authorization phase. Message sent to scheduling to have appointment changed to 7/10 from 7/12

## 2016-10-21 NOTE — Telephone Encounter (Signed)
Oral Chemotherapy Pharmacist Encounter  Received notification from [facility] that patient's Xeloda prescription would require prior authorization.  PA submitted on CoverMyMeds Key [id number] Additional information is required by plan.  Oral Oncology Clinic will continue to follow.  Johny Drilling, PharmD, BCPS, BCOP 10/21/2016  3:38 PM Oral Oncology Clinic [phone]

## 2016-10-21 NOTE — Telephone Encounter (Signed)
Oral Chemotherapy Pharmacist Encounter  Medical records faxed to [facility] at [phone] to completed PA request for Xeloda. Oral Oncology Clinic will continue to follow.  Johny Drilling, PharmD, BCPS, BCOP 10/21/2016  3:55 PM Oral Oncology Clinic [phone]

## 2016-10-22 ENCOUNTER — Telehealth: Payer: Self-pay | Admitting: *Deleted

## 2016-10-22 NOTE — Telephone Encounter (Signed)
Spoke with pt's wife, she reports cough began a few days ago but seems to be worsening. He has not tried anything for the cough. Checked temp around 12PM: 99.0. Wife reports exertional dyspnea has not changed. Pt does not want to come in to be seen, is requesting an antibiotic.

## 2016-10-22 NOTE — Telephone Encounter (Signed)
Returned call to wife: Likely viral infection, per Dr. Benay Spice. Push fluids and take OTC med (Robitussin or Mucinex), call office for dyspnea or high fever. Teach back complete. Wife stated she will call if symptoms do not improve.

## 2016-10-22 NOTE — Telephone Encounter (Signed)
"  1. My husband has developed a cough and temperature overnight.  Can something be called in?  Yesterday he started coughing brownish green mucus with normal temp.  This morning temp = 99.9.  2. He also is to begin Xeloda but we still have not heard anything.  Return number [phone] or mobile [phone]."  Will notify provider.  Prior authorization for Xeloda pending BCBS response per yesterday's documentation.

## 2016-10-28 NOTE — Telephone Encounter (Signed)
Oral Chemotherapy Pharmacist Encounter  Received notification from [facility] that prior authorization of Xeloda has been approved Effective dates: 10/21/16-10/20/17 Ref# [id number]  I called [facility] at [phone] to alert them to continue to process prescription. Per pharmacy staff member, pharmacist Eual Fines will be notified and reach out to office with any further issues.  Oral oncology Clinic will continue to follow.  Johny Drilling, PharmD, BCPS, BCOP 10/28/2016  8:40 AM Oral Oncology Clinic [phone]

## 2016-11-05 ENCOUNTER — Ambulatory Visit (HOSPITAL_BASED_OUTPATIENT_CLINIC_OR_DEPARTMENT_OTHER): Payer: BLUE CROSS/BLUE SHIELD | Admitting: Nurse Practitioner

## 2016-11-05 ENCOUNTER — Ambulatory Visit (HOSPITAL_COMMUNITY)
Admission: RE | Admit: 2016-11-05 | Discharge: 2016-11-05 | Disposition: A | Discharge status: Home / Self Care | Payer: BLUE CROSS/BLUE SHIELD | Source: Ambulatory Visit | Attending: Nurse Practitioner | Admitting: Nurse Practitioner

## 2016-11-05 ENCOUNTER — Other Ambulatory Visit: Payer: Self-pay | Admitting: Hematology and Oncology

## 2016-11-05 ENCOUNTER — Ambulatory Visit: Payer: BLUE CROSS/BLUE SHIELD

## 2016-11-05 ENCOUNTER — Other Ambulatory Visit (HOSPITAL_BASED_OUTPATIENT_CLINIC_OR_DEPARTMENT_OTHER): Payer: BLUE CROSS/BLUE SHIELD

## 2016-11-05 VITALS — BP 104/75 | HR 104 | Temp 98.5°F | Resp 18 | Ht 71.0 in | Wt 191.7 lb

## 2016-11-05 DIAGNOSIS — C7949 Secondary malignant neoplasm of other parts of nervous system: Secondary | ICD-10-CM | POA: Diagnosis not present

## 2016-11-05 DIAGNOSIS — C771 Secondary and unspecified malignant neoplasm of intrathoracic lymph nodes: Secondary | ICD-10-CM

## 2016-11-05 DIAGNOSIS — E860 Dehydration: Secondary | ICD-10-CM

## 2016-11-05 DIAGNOSIS — R0981 Nasal congestion: Secondary | ICD-10-CM | POA: Diagnosis not present

## 2016-11-05 DIAGNOSIS — C7801 Secondary malignant neoplasm of right lung: Secondary | ICD-10-CM

## 2016-11-05 DIAGNOSIS — C7951 Secondary malignant neoplasm of bone: Secondary | ICD-10-CM | POA: Diagnosis not present

## 2016-11-05 DIAGNOSIS — C154 Malignant neoplasm of middle third of esophagus: Secondary | ICD-10-CM

## 2016-11-05 DIAGNOSIS — Z95828 Presence of other vascular implants and grafts: Secondary | ICD-10-CM

## 2016-11-05 DIAGNOSIS — R05 Cough: Secondary | ICD-10-CM | POA: Insufficient documentation

## 2016-11-05 LAB — CBC WITH DIFFERENTIAL/PLATELET
BASO%: 0.2 % (ref 0.0–2.0)
BASOS ABS: 0 10*3/uL (ref 0.0–0.1)
EOS%: 0.3 % (ref 0.0–7.0)
Eosinophils Absolute: 0 10*3/uL (ref 0.0–0.5)
HEMATOCRIT: 38 % — AB (ref 38.4–49.9)
HGB: 12.6 g/dL — ABNORMAL LOW (ref 13.0–17.1)
LYMPH#: 1.3 10*3/uL (ref 0.9–3.3)
LYMPH%: 14.9 % (ref 14.0–49.0)
MCH: 33.3 pg (ref 27.2–33.4)
MCHC: 33.2 g/dL (ref 32.0–36.0)
MCV: 100.5 fL — ABNORMAL HIGH (ref 79.3–98.0)
MONO#: 1 10*3/uL — AB (ref 0.1–0.9)
MONO%: 11.1 % (ref 0.0–14.0)
NEUT#: 6.4 10*3/uL (ref 1.5–6.5)
NEUT%: 73.5 % (ref 39.0–75.0)
PLATELETS: 254 10*3/uL (ref 140–400)
RBC: 3.78 10*6/uL — AB (ref 4.20–5.82)
RDW: 13.5 % (ref 11.0–14.6)
WBC: 8.7 10*3/uL (ref 4.0–10.3)

## 2016-11-05 LAB — COMPREHENSIVE METABOLIC PANEL
ALT: 17 U/L (ref 0–55)
ANION GAP: 11 meq/L (ref 3–11)
AST: 16 U/L (ref 5–34)
Albumin: 3 g/dL — ABNORMAL LOW (ref 3.5–5.0)
Alkaline Phosphatase: 130 U/L (ref 40–150)
BUN: 13 mg/dL (ref 7.0–26.0)
CALCIUM: 10.7 mg/dL — AB (ref 8.4–10.4)
CHLORIDE: 104 meq/L (ref 98–109)
CO2: 29 meq/L (ref 22–29)
Creatinine: 1 mg/dL (ref 0.7–1.3)
EGFR: 76 mL/min/{1.73_m2} — AB (ref >90)
Glucose: 129 mg/dl (ref 70–140)
POTASSIUM: 3.4 meq/L — AB (ref 3.5–5.1)
Sodium: 143 mEq/L (ref 136–145)
Total Bilirubin: 0.42 mg/dL (ref 0.20–1.20)
Total Protein: 6.6 g/dL (ref 6.4–8.3)

## 2016-11-05 MED ORDER — SODIUM CHLORIDE 0.9% FLUSH
10.0000 mL | INTRAVENOUS | Status: DC | PRN
  Administered 2016-11-05: 10 mL via INTRAVENOUS
  Filled 2016-11-05: qty 10

## 2016-11-05 MED ORDER — AMOXICILLIN-POT CLAVULANATE 500-125 MG PO TABS: 1.0000 | ORAL_TABLET | Freq: Three times a day (TID) | ORAL | 0 refills | Status: DC

## 2016-11-05 MED ORDER — HEPARIN SOD (PORK) LOCK FLUSH 100 UNIT/ML IV SOLN
500.0000 [IU] | Freq: Once | INTRAVENOUS | Status: AC | PRN
  Administered 2016-11-05: 500 [IU] via INTRAVENOUS
  Filled 2016-11-05: qty 5

## 2016-11-05 MED ORDER — SODIUM CHLORIDE 0.9 % IV SOLN
INTRAVENOUS | Status: DC
  Administered 2016-11-05: 15:00:00 via INTRAVENOUS

## 2016-11-05 NOTE — Patient Instructions (Signed)
Dehydration, Adult Dehydration is when there is not enough fluid or water in your body. This happens when you lose more fluids than you take in. Dehydration can range from mild to very bad. It should be treated right away to keep it from getting very bad. Symptoms of mild dehydration may include:  Thirst.  Dry lips.  Slightly dry mouth.  Dry, warm skin.  Dizziness. Symptoms of moderate dehydration may include:  Very dry mouth.  Muscle cramps.  Dark pee (urine). Pee may be the color of tea.  Your body making less pee.  Your eyes making fewer tears.  Heartbeat that is uneven or faster than normal (palpitations).  Headache.  Light-headedness, especially when you stand up from sitting.  Fainting (syncope). Symptoms of very bad dehydration may include:  Changes in skin, such as: ? Cold and clammy skin. ? Blotchy (mottled) or pale skin. ? Skin that does not quickly return to normal after being lightly pinched and let go (poor skin turgor).  Changes in body fluids, such as: ? Feeling very thirsty. ? Your eyes making fewer tears. ? Not sweating when body temperature is high, such as in hot weather. ? Your body making very little pee.  Changes in vital signs, such as: ? Weak pulse. ? Pulse that is more than 100 beats a minute when you are sitting still. ? Fast breathing. ? Low blood pressure.  Other changes, such as: ? Sunken eyes. ? Cold hands and feet. ? Confusion. ? Lack of energy (lethargy). ? Trouble waking up from sleep. ? Short-term weight loss. ? Unconsciousness. Follow these instructions at home:  If told by your doctor, drink an ORS: ? Make an ORS by using instructions on the package. ? Start by drinking small amounts, about  cup (120 mL) every 5-10 minutes. ? Slowly drink more until you have had the amount that your doctor said to have.  Drink enough clear fluid to keep your pee clear or pale yellow. If you were told to drink an ORS, finish the ORS  first, then start slowly drinking clear fluids. Drink fluids such as: ? Water. Do not drink only water by itself. Doing that can make the salt (sodium) level in your body get too low (hyponatremia). ? Ice chips. ? Fruit juice that you have added water to (diluted). ? Low-calorie sports drinks.  Avoid: ? Alcohol. ? Drinks that have a lot of sugar. These include high-calorie sports drinks, fruit juice that does not have water added, and soda. ? Caffeine. ? Foods that are greasy or have a lot of fat or sugar.  Take over-the-counter and prescription medicines only as told by your doctor.  Do not take salt tablets. Doing that can make the salt level in your body get too high (hypernatremia).  Eat foods that have minerals (electrolytes). Examples include bananas, oranges, potatoes, tomatoes, and spinach.  Keep all follow-up visits as told by your doctor. This is important. Contact a doctor if:  You have belly (abdominal) pain that: ? Gets worse. ? Stays in one area (localizes).  You have a rash.  You have a stiff neck.  You get angry or annoyed more easily than normal (irritability).  You are more sleepy than normal.  You have a harder time waking up than normal.  You feel: ? Weak. ? Dizzy. ? Very thirsty.  You have peed (urinated) only a small amount of very dark pee during 6-8 hours. Get help right away if:  You have symptoms of   very bad dehydration.  You cannot drink fluids without throwing up (vomiting).  Your symptoms get worse with treatment.  You have a fever.  You have a very bad headache.  You are throwing up or having watery poop (diarrhea) and it: ? Gets worse. ? Does not go away.  You have blood or something green (bile) in your throw-up.  You have blood in your poop (stool). This may cause poop to look black and tarry.  You have not peed in 6-8 hours.  You pass out (faint).  Your heart rate when you are sitting still is more than 100 beats a  minute.  You have trouble breathing. This information is not intended to replace advice given to you by your health care provider. Make sure you discuss any questions you have with your health care provider. Document Released: 02/09/2009 Document Revised: 11/03/2015 Document Reviewed: 06/09/2015 Elsevier Interactive Patient Education  2018 Elsevier Inc.  

## 2016-11-05 NOTE — Patient Instructions (Signed)

## 2016-11-05 NOTE — Progress Notes (Signed)
[facility] OFFICE PROGRESS NOTE   Diagnosis:  Esophagus cancer  INTERVAL HISTORY:   [name] returns as scheduled. He reports an approximate 2 week history of sinus drainage, productive cough, nasal congestion. Right ear feels "full". 2 weeks ago he had a fever of 100. He notes some shortness of breath with exertion. Overall he feels weak. The right eye is "watery". Vision is blurred. No double vision. He has had a few episodes of "blacking out". He has occasional mild nausea. No diarrhea. Appetite described as "okay". He thinks he has a sinus infection.  Objective:  Vital signs in last 24 hours:  Blood pressure 104/75, pulse (!) 104, temperature 98.5 F (36.9 C), temperature source Oral, resp. rate 18, height _0  (1.803 m), weight 191 lb 11.2 oz (87 kg), SpO2 98 %.    HEENT: Right eye with mild conjunctival erythema. Extraocular movements intact. No thrush or ulcers. Resp: Lungs clear bilaterally. Cardio: Regular rate and rhythm. GI: Abdomen soft and nontender. No organomegaly. Vascular: No leg edema. Neuro: Alert and oriented.   Port-A-Cath without erythema. Lab Results:  Lab Results  Component Value Date   WBC 8.7 11/05/2016   HGB 12.6 (L) 11/05/2016   HCT 38.0 (L) 11/05/2016   MCV 100.5 (H) 11/05/2016   PLT 254 11/05/2016   NEUTROABS 6.4 11/05/2016    Imaging:  No results found.  Medications: I have reviewed the patient's current medications.  Assessment/Plan: 1. 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 Radiation completed 06/10/2014  PET scan 07/01/2014 revealed improvement in the hypermetabolic esophagus mass and  mediastinal lymphadenopathy  upper endoscopy 04/20/2015 -mid esophagus with mild narrowing , 2 small nodules measuring approximate 5 mm -biopsy with scant and detached fragments with high-grade squamous dysplasia , brushings negative for malignant cells.  CT chest/abdomen/pelvis 05/25/2015 with development of bilateral pulmonary nodules; paratracheal and subcarinal nodes improved/similar; new small lower paraesophageal nodes which are indeterminate; esophageal wall thickening improved since the prior PET; no evidence of metastatic disease in the abdomen or pelvis.  PET scan 06/16/2015 revealed hypermetabolic lung nodules, hypermetabolic mediastinal nodes, hypermetabolism at the mid to distal esophagus has improved  bronchoscopy/EBUS on 07/04/2015 confirmed squamous cell carcinoma involving right lower lobe brushings, right bronchial washings, a level 10 R lymph node biopsy, and right lower lobe biopsy  PD-L1 no expression on the right lower lobe biopsy (less than 1%)  Cycle 1 Pembrolizumab 07/26/2015  Cycle 2 Pembrolizumab 08/16/2015  Cycle 3 Pembrolizumab 09/06/2015  Cycle 4 Pembrolizumab 09/27/2015  CT chest 10/11/2015-stable/decreased size of pulmonary nodules, enlargement of several mediastinal nodes  Cycle 5 Pembrolizumab 10/18/2015  Cycle 6 Pembrolizumab 11/08/2015   Cycle 7 Pembrolizumab08/05/2015  Cycle 8 Pembrolizumab08/23/2017  CT chest 01/15/2016-slight progression of mediastinal/upper abdominal lymphadenopathy and enlargement of a right lower lobe nodule  Cycle 9 Pembrolizumab 01/17/2016   Cycle 10 Pembrolizumab10/02/2016  Cycle 11 Pembrolizumab 02/28/2016  Cycle 12 Pembrolizumab 03/20/2016  CT chest 04/25/2016-minimal progression of chest lymphadenopathy and lung nodules, thickening at the mid thoracic esophagus  MRI brain 04/30/2016-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 with involvement of the right orbit, no brain  metastases  Palliative radiation right sphenoid lesion beginning 05/02/2016  CT chest 06/13/2016-left lower lobe pulmonary embolus, increased mediastinal and hilar  adenopathy, septal thickening and clustered nodules in the right lower lobe, gastrohepatic lymphadenopathy  Cycle 1 FOLFOX 06/24/2016  Cycle 2 FOLFOX 07/15/2016  Cycle 3 FOLFOX 07/29/2016  Cycle 4 FOLFOX 08/12/2016  Cycle 5 FOLFOX 08/27/2016  Restaging CTs of the head and chest on 09/09/2016-decreased hilar/mediastinal lymphadenopathy, decreased lung nodules, stable esophageal thickening. Decreased right orbital mass, progressive calvarial metastases compared to an MRI from 04/30/2016  Cycle 6 FOLFOX 09/10/2016  Cycle 7 FOLFOX 09/25/2016  2. Solid/liquid dysphagia secondary to #1  Jejunostomy feeding tube placement 04/07/2014  Feeding tube removed 05/30/2014.  3. Small bowel obstruction following placement of the jejunostomy feeding tube-resolved  4. History of ulcerative colitis  5. Aspiration pneumonia December 2015  6. 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 Fever/cough/dyspnea 04/30/2016-nasal swab positive for influenza A , chest x-ray with a left lower lobe Pneumonia -treated with Tamiflu and Levaquin   8. Admission 06/13/2016 with a cough/left-sided chest pain/dyspnea/tachycardia/low-grade fever-pulmonary embolism confirmed on a chest CT, progressive chest adenopathy, inflammatory changes versus lymphatic tumor spread in the right lower lobe.Maintained on Xarelto.  9. Port-A-Cath placement 06/11/2016  10. Dysphagia. Barium swallow 05/01/2016 with circumferential narrowing of the midesophagus causing moderate degree of stricture which would not allow the 13 mm barium tablet to pass. The dysphagia improved spontaneously. He developed recurrent dysphagia to solids 06/15/2016. He is  tolerating liquids.  11. Recurrent syncope events-borderline low cortisol level III/XII/MMXVIII, symptoms improved with hydrocortisone replacement  12. 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 Ciano appears stable. He plans to begin Xeloda 11/06/2016.  He may have sinusitis. Symptoms have been ongoing for at least 2 weeks. He will complete a 7 day course of Augmentin. We are referring him for a chest x-ray to rule out pneumonia.  Review of today's labs shows mild hypercalcemia. This may be related to dehydration though he does have a history of hypercalcemia of malignancy. He will receive a liter of IV fluids in the office today. He will return for a repeat chemistry panel in one week.  He will return for labs and a follow-up visit in 2 weeks. He will contact the office in the interim with any problems.  Plan reviewed with Dr. Benay Spice. 25 minutes were spent face-to-face at today's visit with the majority of that time involved in counseling/coordination of care.    Ned Card ANP/GNP-BC   11/05/2016  2:41 PM

## 2016-11-06 ENCOUNTER — Other Ambulatory Visit: Payer: Self-pay | Admitting: Hematology and Oncology

## 2016-11-06 NOTE — Telephone Encounter (Signed)
Oral Chemotherapy Pharmacist Encounter   I spoke with patient's wife, Billy Romero, for overview of new oral chemotherapy medication: Xeloda. Pt is doing well.  Planned start date is today 11/06/16 Patient received Xeloda shipment from [facility] on 11/05/16  Counseled [name] on administration, dosing, side effects, safe handling, and monitoring. Patient will take Xeloda 500mg  4 tablets (2000mg  total) by mouth in the AM and Xeloda 500mg  3 tablets (1500mg  total) by mouth in the PM, within 30 minutes of finishing a meal. Patient will take Xeloda on a 1 week on, 1 week off schedule.  Side effects include but not limited to: fatigue, GI upset, diarrhea, decreased blood counts, and hand-foot syndrome.  [name] voiced understanding and appreciation.   All questions answered.  [name] did note that they did not make any future appointments for follow-up prior to leaving the office on 7/10, and need to be seen again in 1-2 weeks. These appointments have not yet been scheduled.  Billy Romero knows to call the office with questions or concerns. Oral Oncology Clinic will continue to follow.  Thank you,  Johny Drilling, PharmD, BCPS, BCOP 11/06/2016  10:02 AM Oral Oncology Clinic [phone]

## 2016-11-06 NOTE — Telephone Encounter (Signed)
Please be sure his follow-up appointment is scheduled  Thanks

## 2016-11-06 NOTE — Telephone Encounter (Signed)
Pt under Dr.Sherrill

## 2016-11-07 ENCOUNTER — Ambulatory Visit: Payer: BLUE CROSS/BLUE SHIELD | Admitting: Nurse Practitioner

## 2016-11-07 ENCOUNTER — Other Ambulatory Visit: Payer: BLUE CROSS/BLUE SHIELD

## 2016-11-12 ENCOUNTER — Other Ambulatory Visit (HOSPITAL_BASED_OUTPATIENT_CLINIC_OR_DEPARTMENT_OTHER): Payer: BLUE CROSS/BLUE SHIELD

## 2016-11-12 ENCOUNTER — Ambulatory Visit: Payer: BLUE CROSS/BLUE SHIELD

## 2016-11-12 VITALS — BP 110/70 | HR 95 | Temp 98.0°F | Resp 20

## 2016-11-12 DIAGNOSIS — C7801 Secondary malignant neoplasm of right lung: Secondary | ICD-10-CM

## 2016-11-12 DIAGNOSIS — C7949 Secondary malignant neoplasm of other parts of nervous system: Secondary | ICD-10-CM | POA: Diagnosis not present

## 2016-11-12 DIAGNOSIS — C771 Secondary and unspecified malignant neoplasm of intrathoracic lymph nodes: Secondary | ICD-10-CM

## 2016-11-12 DIAGNOSIS — C7951 Secondary malignant neoplasm of bone: Secondary | ICD-10-CM | POA: Diagnosis not present

## 2016-11-12 DIAGNOSIS — C154 Malignant neoplasm of middle third of esophagus: Secondary | ICD-10-CM | POA: Diagnosis not present

## 2016-11-12 DIAGNOSIS — Z95828 Presence of other vascular implants and grafts: Secondary | ICD-10-CM

## 2016-11-12 LAB — COMPREHENSIVE METABOLIC PANEL
ALT: 22 U/L (ref 0–55)
ANION GAP: 12 meq/L — AB (ref 3–11)
AST: 21 U/L (ref 5–34)
Albumin: 3.1 g/dL — ABNORMAL LOW (ref 3.5–5.0)
Alkaline Phosphatase: 127 U/L (ref 40–150)
BILIRUBIN TOTAL: 0.68 mg/dL (ref 0.20–1.20)
BUN: 13.3 mg/dL (ref 7.0–26.0)
CO2: 26 meq/L (ref 22–29)
CREATININE: 1 mg/dL (ref 0.7–1.3)
Calcium: 10 mg/dL (ref 8.4–10.4)
Chloride: 105 mEq/L (ref 98–109)
EGFR: 82 mL/min/{1.73_m2} — ABNORMAL LOW (ref >90)
Glucose: 104 mg/dl (ref 70–140)
Potassium: 3.5 mEq/L (ref 3.5–5.1)
Sodium: 144 mEq/L (ref 136–145)
TOTAL PROTEIN: 6.5 g/dL (ref 6.4–8.3)

## 2016-11-12 MED ORDER — SODIUM CHLORIDE 0.9% FLUSH
10.0000 mL | INTRAVENOUS | Status: DC | PRN
  Administered 2016-11-12: 10 mL via INTRAVENOUS
  Filled 2016-11-12: qty 10

## 2016-11-12 MED ORDER — HEPARIN SOD (PORK) LOCK FLUSH 100 UNIT/ML IV SOLN
500.0000 [IU] | Freq: Once | INTRAVENOUS | Status: AC | PRN
  Administered 2016-11-12: 500 [IU] via INTRAVENOUS
  Filled 2016-11-12: qty 5

## 2016-11-12 NOTE — Patient Instructions (Signed)
Implanted Port Home Guide An implanted port is a type of central line that is placed under the skin. Central lines are used to provide IV access when treatment or nutrition needs to be given through a person's veins. Implanted ports are used for long-term IV access. An implanted port may be placed because:  You need IV medicine that would be irritating to the small veins in your hands or arms.  You need long-term IV medicines, such as antibiotics.  You need IV nutrition for a long period.  You need frequent blood draws for lab tests.  You need dialysis.  Implanted ports are usually placed in the chest area, but they can also be placed in the upper arm, the abdomen, or the leg. An implanted port has two main parts:  Reservoir. The reservoir is round and will appear as a small, raised area under your skin. The reservoir is the part where a needle is inserted to give medicines or draw blood.  Catheter. The catheter is a thin, flexible tube that extends from the reservoir. The catheter is placed into a large vein. Medicine that is inserted into the reservoir goes into the catheter and then into the vein.  How will I care for my incision site? Do not get the incision site wet. Bathe or shower as directed by your health care provider. How is my port accessed? Special steps must be taken to access the port:  Before the port is accessed, a numbing cream can be placed on the skin. This helps numb the skin over the port site.  Your health care provider uses a sterile technique to access the port. ? Your health care provider must put on a mask and sterile gloves. ? The skin over your port is cleaned carefully with an antiseptic and allowed to dry. ? The port is gently pinched between sterile gloves, and a needle is inserted into the port.  Only "non-coring" port needles should be used to access the port. Once the port is accessed, a blood return should be checked. This helps ensure that the port  is in the vein and is not clogged.  If your port needs to remain accessed for a constant infusion, a clear (transparent) bandage will be placed over the needle site. The bandage and needle will need to be changed every week, or as directed by your health care provider.  Keep the bandage covering the needle clean and dry. Do not get it wet. Follow your health care provider's instructions on how to take a shower or bath while the port is accessed.  If your port does not need to stay accessed, no bandage is needed over the port.  What is flushing? Flushing helps keep the port from getting clogged. Follow your health care provider's instructions on how and when to flush the port. Ports are usually flushed with saline solution or a medicine called heparin. The need for flushing will depend on how the port is used.  If the port is used for intermittent medicines or blood draws, the port will need to be flushed: ? After medicines have been given. ? After blood has been drawn. ? As part of routine maintenance.  If a constant infusion is running, the port may not need to be flushed.  How long will my port stay implanted? The port can stay in for as long as your health care provider thinks it is needed. When it is time for the port to come out, surgery will be   done to remove it. The procedure is similar to the one performed when the port was put in. When should I seek immediate medical care? When you have an implanted port, you should seek immediate medical care if:  You notice a bad smell coming from the incision site.  You have swelling, redness, or drainage at the incision site.  You have more swelling or pain at the port site or the surrounding area.  You have a fever that is not controlled with medicine.  This information is not intended to replace advice given to you by your health care provider. Make sure you discuss any questions you have with your health care provider. Document  Released: 04/15/2005 Document Revised: 09/21/2015 Document Reviewed: 12/21/2012 Elsevier Interactive Patient Education  2017 [employer]  

## 2016-11-13 ENCOUNTER — Telehealth: Payer: Self-pay | Admitting: *Deleted

## 2016-11-13 NOTE — Telephone Encounter (Signed)
-----   Message from Owens Shark, NP sent at 11/07/2016  8:52 AM EDT ----- Please let him know the chest x-ray was negative for evidence of pneumonia. Follow-up as scheduled.

## 2016-11-13 NOTE — Telephone Encounter (Signed)
Telephone call to patient to relay results as directed below. Patients wife verified she knew the results and they understand to keep follow up as scheduled.

## 2016-11-19 ENCOUNTER — Encounter: Payer: Self-pay | Admitting: *Deleted

## 2016-11-20 ENCOUNTER — Telehealth: Payer: Self-pay

## 2016-11-20 ENCOUNTER — Other Ambulatory Visit (HOSPITAL_BASED_OUTPATIENT_CLINIC_OR_DEPARTMENT_OTHER): Payer: BLUE CROSS/BLUE SHIELD

## 2016-11-20 ENCOUNTER — Ambulatory Visit: Payer: BLUE CROSS/BLUE SHIELD

## 2016-11-20 ENCOUNTER — Ambulatory Visit (HOSPITAL_BASED_OUTPATIENT_CLINIC_OR_DEPARTMENT_OTHER): Payer: BLUE CROSS/BLUE SHIELD | Admitting: Nurse Practitioner

## 2016-11-20 ENCOUNTER — Other Ambulatory Visit: Payer: Self-pay | Admitting: Oncology

## 2016-11-20 ENCOUNTER — Other Ambulatory Visit: Payer: Self-pay | Admitting: Nurse Practitioner

## 2016-11-20 VITALS — BP 109/76 | HR 112 | Temp 98.7°F | Resp 18 | Ht 71.0 in | Wt 191.8 lb

## 2016-11-20 DIAGNOSIS — C7951 Secondary malignant neoplasm of bone: Secondary | ICD-10-CM

## 2016-11-20 DIAGNOSIS — C7949 Secondary malignant neoplasm of other parts of nervous system: Secondary | ICD-10-CM | POA: Diagnosis not present

## 2016-11-20 DIAGNOSIS — C154 Malignant neoplasm of middle third of esophagus: Secondary | ICD-10-CM

## 2016-11-20 DIAGNOSIS — Z86711 Personal history of pulmonary embolism: Secondary | ICD-10-CM | POA: Diagnosis not present

## 2016-11-20 DIAGNOSIS — C771 Secondary and unspecified malignant neoplasm of intrathoracic lymph nodes: Secondary | ICD-10-CM | POA: Diagnosis not present

## 2016-11-20 DIAGNOSIS — C7801 Secondary malignant neoplasm of right lung: Secondary | ICD-10-CM

## 2016-11-20 DIAGNOSIS — R05 Cough: Secondary | ICD-10-CM

## 2016-11-20 DIAGNOSIS — Z95828 Presence of other vascular implants and grafts: Secondary | ICD-10-CM

## 2016-11-20 LAB — COMPREHENSIVE METABOLIC PANEL
ALT: 19 U/L (ref 0–55)
AST: 18 U/L (ref 5–34)
Albumin: 3.1 g/dL — ABNORMAL LOW (ref 3.5–5.0)
Alkaline Phosphatase: 152 U/L — ABNORMAL HIGH (ref 40–150)
Anion Gap: 11 mEq/L (ref 3–11)
BUN: 11.3 mg/dL (ref 7.0–26.0)
CHLORIDE: 103 meq/L (ref 98–109)
CO2: 28 meq/L (ref 22–29)
Calcium: 10.6 mg/dL — ABNORMAL HIGH (ref 8.4–10.4)
Creatinine: 1 mg/dL (ref 0.7–1.3)
EGFR: 78 mL/min/{1.73_m2} — ABNORMAL LOW (ref >90)
GLUCOSE: 116 mg/dL (ref 70–140)
POTASSIUM: 3.6 meq/L (ref 3.5–5.1)
SODIUM: 142 meq/L (ref 136–145)
Total Bilirubin: 0.59 mg/dL (ref 0.20–1.20)
Total Protein: 6.6 g/dL (ref 6.4–8.3)

## 2016-11-20 LAB — CBC WITH DIFFERENTIAL/PLATELET
BASO%: 0.2 % (ref 0.0–2.0)
BASOS ABS: 0 10*3/uL (ref 0.0–0.1)
EOS%: 0.4 % (ref 0.0–7.0)
Eosinophils Absolute: 0 10*3/uL (ref 0.0–0.5)
HCT: 37.7 % — ABNORMAL LOW (ref 38.4–49.9)
HEMOGLOBIN: 12.5 g/dL — AB (ref 13.0–17.1)
LYMPH%: 11.7 % — ABNORMAL LOW (ref 14.0–49.0)
MCH: 32.6 pg (ref 27.2–33.4)
MCHC: 33.2 g/dL (ref 32.0–36.0)
MCV: 98.4 fL — ABNORMAL HIGH (ref 79.3–98.0)
MONO#: 1.2 10*3/uL — ABNORMAL HIGH (ref 0.1–0.9)
MONO%: 11.4 % (ref 0.0–14.0)
NEUT#: 8 10*3/uL — ABNORMAL HIGH (ref 1.5–6.5)
NEUT%: 76.3 % — AB (ref 39.0–75.0)
Platelets: 216 10*3/uL (ref 140–400)
RBC: 3.83 10*6/uL — AB (ref 4.20–5.82)
RDW: 14.4 % (ref 11.0–14.6)
WBC: 10.5 10*3/uL — ABNORMAL HIGH (ref 4.0–10.3)
lymph#: 1.2 10*3/uL (ref 0.9–3.3)

## 2016-11-20 MED ORDER — SODIUM CHLORIDE 0.9% FLUSH
10.0000 mL | INTRAVENOUS | Status: DC | PRN
  Administered 2016-11-20: 10 mL via INTRAVENOUS
  Filled 2016-11-20: qty 10

## 2016-11-20 MED ORDER — HEPARIN SOD (PORK) LOCK FLUSH 100 UNIT/ML IV SOLN
500.0000 [IU] | Freq: Once | INTRAVENOUS | Status: AC | PRN
  Administered 2016-11-20: 500 [IU] via INTRAVENOUS
  Filled 2016-11-20: qty 5

## 2016-11-20 NOTE — Telephone Encounter (Signed)
appts made and avs printed for pt per 7/25 los  Billy Romero

## 2016-11-20 NOTE — Progress Notes (Signed)
[facility] OFFICE PROGRESS NOTE   Diagnosis:  Esophagus cancer  INTERVAL HISTORY:   Mr. Rosenberg returns as scheduled. He began Xeloda 7 days on/7 days off on 11/06/2016. He denies nausea/vomiting. No mouth sores. No diarrhea. No hand or foot pain or redness. He notes persistent numbness in the hands. He was prescribed antibiotics following his last appointment on 11/05/2016. Symptoms including sinus drainage, productive cough have improved but persist to some degree. His cough was worse yesterday. He expectorated phlegm mixed with blood. He notes that he becomes short of breath and feels weak when he coughs. No chest pain. The cough is better today and he has noted no blood. No fever.  Objective:  Vital signs in last 24 hours:  Blood pressure 109/76, pulse (!) 112, temperature 98.7 F (37.1 C), temperature source Oral, resp. rate 18, height _0  (1.803 m), weight 191 lb 12.8 oz (87 kg), SpO2 99 %.    HEENT: No thrush or ulcers. Resp: Lungs clear bilaterally. Cardio: Regular rate and rhythm. GI: Abdomen soft and nontender. No hepatosplenomegaly. Vascular: No leg edema. Skin: Palms without erythema. Port-A-Cath without erythema.    Lab Results:  Lab Results  Component Value Date   WBC 10.5 (H) 11/20/2016   HGB 12.5 (L) 11/20/2016   HCT 37.7 (L) 11/20/2016   MCV 98.4 (H) 11/20/2016   PLT 216 11/20/2016   NEUTROABS 8.0 (H) 11/20/2016    Imaging:  No results found.  Medications: I have reviewed the patient's current medications.  Assessment/Plan: 1. 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 Radiation completed 06/10/2014  PET scan  07/01/2014 revealed improvement in the hypermetabolic esophagus mass and mediastinal lymphadenopathy  upper endoscopy 04/20/2015 -mid esophagus with mild narrowing , 2 small nodules measuring approximate 5 mm -biopsy with scant and detached fragments with high-grade squamous dysplasia , brushings negative for malignant cells.  CT chest/abdomen/pelvis 05/25/2015 with development of bilateral pulmonary nodules; paratracheal and subcarinal nodes improved/similar; new small lower paraesophageal nodes which are indeterminate; esophageal wall thickening improved since the prior PET; no evidence of metastatic disease in the abdomen or pelvis.  PET scan 06/16/2015 revealed hypermetabolic lung nodules, hypermetabolic mediastinal nodes, hypermetabolism at the mid to distal esophagus has improved  bronchoscopy/EBUS on 07/04/2015 confirmed squamous cell carcinoma involving right lower lobe brushings, right bronchial washings, a level 10 R lymph node biopsy, and right lower lobe biopsy  PD-L1 no expression on the right lower lobe biopsy (less than 1%)  Cycle 1 Pembrolizumab 07/26/2015  Cycle 2 Pembrolizumab 08/16/2015  Cycle 3 Pembrolizumab 09/06/2015  Cycle 4 Pembrolizumab 09/27/2015  CT chest 10/11/2015-stable/decreased size of pulmonary nodules, enlargement of several mediastinal nodes  Cycle 5 Pembrolizumab 10/18/2015  Cycle 6 Pembrolizumab 11/08/2015   Cycle 7 Pembrolizumab08/05/2015  Cycle 8 Pembrolizumab08/23/2017  CT chest 01/15/2016-slight progression of mediastinal/upper abdominal lymphadenopathy and enlargement of a right lower lobe nodule  Cycle 9 Pembrolizumab 01/17/2016   Cycle 10 Pembrolizumab10/02/2016  Cycle 11 Pembrolizumab 02/28/2016  Cycle 12 Pembrolizumab 03/20/2016  CT chest 04/25/2016-minimal progression of chest lymphadenopathy and lung nodules, thickening at the mid thoracic esophagus  MRI brain 04/30/2016-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 with involvement of the right orbit, no brain metastases  Palliative radiation right sphenoid lesion beginning  05/02/2016  CT chest 06/13/2016-left lower lobe pulmonary embolus, increased mediastinal and hilar adenopathy, septal thickening and clustered nodules in the right lower lobe, gastrohepatic lymphadenopathy  Cycle 1 FOLFOX 06/24/2016  Cycle 2 FOLFOX 07/15/2016  Cycle 3 FOLFOX 07/29/2016  Cycle 4 FOLFOX 08/12/2016  Cycle 5 FOLFOX 08/27/2016  Restaging CTs of the head and chest on 09/09/2016-decreased hilar/mediastinal lymphadenopathy, decreased lung nodules, stable esophageal thickening. Decreased right orbital mass, progressive calvarial metastases compared to an MRI from 04/30/2016  Cycle 6 FOLFOX 09/10/2016  Cycle 7 FOLFOX 09/25/2016  Xeloda initiated 7 days on/7 days off 11/06/2016  2. Solid/liquid dysphagia secondary to #1  Jejunostomy feeding tube placement 04/07/2014  Feeding tube removed 05/30/2014.  3. Small bowel obstruction following placement of the jejunostomy feeding tube-resolved  4. History of ulcerative colitis  5. Aspiration pneumonia December 2015  6. 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 Fever/cough/dyspnea 04/30/2016-nasal swab positive for influenza A , chest x-ray with a left lower lobe Pneumonia -treated with Tamiflu and Levaquin   8. Admission 06/13/2016 with a cough/left-sided chest pain/dyspnea/tachycardia/low-grade fever-pulmonary embolism confirmed on a chest CT, progressive chest adenopathy, inflammatory changes versus lymphatic tumor spread in the right lower lobe.Maintained on Xarelto.  9. Port-A-Cath placement 06/11/2016  10. Dysphagia. Barium swallow 05/01/2016 with circumferential narrowing of the midesophagus causing moderate degree of stricture which would not allow the 13 mm barium  tablet to pass. The dysphagia improved spontaneously. He developed recurrent dysphagia to solids 06/15/2016. He is tolerating liquids.  11. Recurrent syncope events-borderline low cortisol level III/XII/MMXVIII, symptoms improved with hydrocortisone replacement  12. 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 Gell appears stable. He will continue Xeloda 7 days on/7 days off.  He continues to have a cough. Yesterday there was associated mild hemoptysis. No bleeding today. We discussed possible etiologies including infection, pneumonitis, cancer progression. We also discussed the possibility of a pulmonary embolus but feel this is less likely as he is maintained on Xarelto due to history of a PE in February of this year. We are referring him for a restaging CT scan of the chest. He understands to contact the office with recurrent hemoptysis.  He will return for a follow-up visit on 12/03/2016. He will contact the office in the interim as outlined above or with any other problems such as fever.  Plan reviewed with Dr. Benay Spice.    Ned Card ANP/GNP-BC   11/20/2016  3:17 PM

## 2016-11-26 ENCOUNTER — Other Ambulatory Visit: Payer: Self-pay

## 2016-11-26 DIAGNOSIS — C154 Malignant neoplasm of middle third of esophagus: Secondary | ICD-10-CM

## 2016-11-26 MED ORDER — PROMETHAZINE HCL 25 MG PO TABS: 25.0000 mg | ORAL_TABLET | Freq: Four times a day (QID) | ORAL | 0 refills | Status: AC | PRN

## 2016-11-27 ENCOUNTER — Other Ambulatory Visit (HOSPITAL_BASED_OUTPATIENT_CLINIC_OR_DEPARTMENT_OTHER): Payer: BLUE CROSS/BLUE SHIELD

## 2016-11-27 ENCOUNTER — Encounter (HOSPITAL_COMMUNITY): Payer: Self-pay

## 2016-11-27 ENCOUNTER — Ambulatory Visit (HOSPITAL_COMMUNITY)
Admission: RE | Admit: 2016-11-27 | Discharge: 2016-11-27 | Disposition: A | Discharge status: Home / Self Care | Payer: BLUE CROSS/BLUE SHIELD | Source: Ambulatory Visit | Attending: Nurse Practitioner | Admitting: Nurse Practitioner

## 2016-11-27 ENCOUNTER — Other Ambulatory Visit: Payer: Self-pay | Admitting: Nurse Practitioner

## 2016-11-27 DIAGNOSIS — Z95828 Presence of other vascular implants and grafts: Secondary | ICD-10-CM

## 2016-11-27 DIAGNOSIS — I700 Atherosclerosis of aorta: Secondary | ICD-10-CM | POA: Insufficient documentation

## 2016-11-27 DIAGNOSIS — C154 Malignant neoplasm of middle third of esophagus: Secondary | ICD-10-CM

## 2016-11-27 DIAGNOSIS — C7801 Secondary malignant neoplasm of right lung: Secondary | ICD-10-CM

## 2016-11-27 DIAGNOSIS — C771 Secondary and unspecified malignant neoplasm of intrathoracic lymph nodes: Secondary | ICD-10-CM | POA: Diagnosis not present

## 2016-11-27 DIAGNOSIS — C7951 Secondary malignant neoplasm of bone: Secondary | ICD-10-CM | POA: Diagnosis not present

## 2016-11-27 DIAGNOSIS — C7949 Secondary malignant neoplasm of other parts of nervous system: Secondary | ICD-10-CM

## 2016-11-27 DIAGNOSIS — E876 Hypokalemia: Secondary | ICD-10-CM

## 2016-11-27 DIAGNOSIS — I2510 Atherosclerotic heart disease of native coronary artery without angina pectoris: Secondary | ICD-10-CM | POA: Insufficient documentation

## 2016-11-27 LAB — COMPREHENSIVE METABOLIC PANEL
ALT: 19 U/L (ref 0–55)
AST: 21 U/L (ref 5–34)
Albumin: 3.1 g/dL — ABNORMAL LOW (ref 3.5–5.0)
Alkaline Phosphatase: 143 U/L (ref 40–150)
Anion Gap: 10 mEq/L (ref 3–11)
BUN: 13 mg/dL (ref 7.0–26.0)
CO2: 29 meq/L (ref 22–29)
Calcium: 10.5 mg/dL — ABNORMAL HIGH (ref 8.4–10.4)
Chloride: 105 mEq/L (ref 98–109)
Creatinine: 1 mg/dL (ref 0.7–1.3)
EGFR: 83 mL/min/{1.73_m2} — AB (ref >90)
GLUCOSE: 99 mg/dL (ref 70–140)
POTASSIUM: 3.1 meq/L — AB (ref 3.5–5.1)
SODIUM: 144 meq/L (ref 136–145)
TOTAL PROTEIN: 6.5 g/dL (ref 6.4–8.3)
Total Bilirubin: 0.9 mg/dL (ref 0.20–1.20)

## 2016-11-27 MED ORDER — IOPAMIDOL (ISOVUE-300) INJECTION 61%
INTRAVENOUS | Status: AC
  Filled 2016-11-27: qty 75

## 2016-11-27 MED ORDER — IOPAMIDOL (ISOVUE-300) INJECTION 61%
75.0000 mL | Freq: Once | INTRAVENOUS | Status: AC | PRN
  Administered 2016-11-27: 75 mL via INTRAVENOUS

## 2016-11-27 MED ORDER — HEPARIN SOD (PORK) LOCK FLUSH 100 UNIT/ML IV SOLN: 500.0000 [IU] | Freq: Once | INTRAVENOUS | Status: DC

## 2016-11-27 MED ORDER — HEPARIN SOD (PORK) LOCK FLUSH 100 UNIT/ML IV SOLN
INTRAVENOUS | Status: AC
  Filled 2016-11-27: qty 5

## 2016-11-27 MED ORDER — SODIUM CHLORIDE 0.9% FLUSH
10.0000 mL | INTRAVENOUS | Status: DC | PRN
  Administered 2016-11-27: 10 mL via INTRAVENOUS
  Filled 2016-11-27: qty 10

## 2016-11-27 MED ORDER — POTASSIUM CHLORIDE CRYS ER 20 MEQ PO TBCR: 20.0000 meq | EXTENDED_RELEASE_TABLET | Freq: Every day | ORAL | 0 refills | Status: DC

## 2016-11-27 NOTE — Patient Instructions (Signed)
Implanted Port Home Guide An implanted port is a type of central line that is placed under the skin. Central lines are used to provide IV access when treatment or nutrition needs to be given through a person's veins. Implanted ports are used for long-term IV access. An implanted port may be placed because:  You need IV medicine that would be irritating to the small veins in your hands or arms.  You need long-term IV medicines, such as antibiotics.  You need IV nutrition for a long period.  You need frequent blood draws for lab tests.  You need dialysis.  Implanted ports are usually placed in the chest area, but they can also be placed in the upper arm, the abdomen, or the leg. An implanted port has two main parts:  Reservoir. The reservoir is round and will appear as a small, raised area under your skin. The reservoir is the part where a needle is inserted to give medicines or draw blood.  Catheter. The catheter is a thin, flexible tube that extends from the reservoir. The catheter is placed into a large vein. Medicine that is inserted into the reservoir goes into the catheter and then into the vein.  How will I care for my incision site? Do not get the incision site wet. Bathe or shower as directed by your health care provider. How is my port accessed? Special steps must be taken to access the port:  Before the port is accessed, a numbing cream can be placed on the skin. This helps numb the skin over the port site.  Your health care provider uses a sterile technique to access the port. ? Your health care provider must put on a mask and sterile gloves. ? The skin over your port is cleaned carefully with an antiseptic and allowed to dry. ? The port is gently pinched between sterile gloves, and a needle is inserted into the port.  Only "non-coring" port needles should be used to access the port. Once the port is accessed, a blood return should be checked. This helps ensure that the port  is in the vein and is not clogged.  If your port needs to remain accessed for a constant infusion, a clear (transparent) bandage will be placed over the needle site. The bandage and needle will need to be changed every week, or as directed by your health care provider.  Keep the bandage covering the needle clean and dry. Do not get it wet. Follow your health care provider's instructions on how to take a shower or bath while the port is accessed.  If your port does not need to stay accessed, no bandage is needed over the port.  What is flushing? Flushing helps keep the port from getting clogged. Follow your health care provider's instructions on how and when to flush the port. Ports are usually flushed with saline solution or a medicine called heparin. The need for flushing will depend on how the port is used.  If the port is used for intermittent medicines or blood draws, the port will need to be flushed: ? After medicines have been given. ? After blood has been drawn. ? As part of routine maintenance.  If a constant infusion is running, the port may not need to be flushed.  How long will my port stay implanted? The port can stay in for as long as your health care provider thinks it is needed. When it is time for the port to come out, surgery will be   done to remove it. The procedure is similar to the one performed when the port was put in. When should I seek immediate medical care? When you have an implanted port, you should seek immediate medical care if:  You notice a bad smell coming from the incision site.  You have swelling, redness, or drainage at the incision site.  You have more swelling or pain at the port site or the surrounding area.  You have a fever that is not controlled with medicine.  This information is not intended to replace advice given to you by your health care provider. Make sure you discuss any questions you have with your health care provider. Document  Released: 04/15/2005 Document Revised: 09/21/2015 Document Reviewed: 12/21/2012 Elsevier Interactive Patient Education  2017 [employer]  

## 2016-11-28 ENCOUNTER — Telehealth: Payer: Self-pay | Admitting: *Deleted

## 2016-11-28 NOTE — Telephone Encounter (Signed)
TCT patient to advise that his Calcium level from yesterday was acceptable but that his K+ was low. Ned Card, NP has ordered KDur 20 meq daily and that prescription should be ready at his pharmacy this morning. Pt voiced understanding.

## 2016-12-02 ENCOUNTER — Other Ambulatory Visit: Payer: Self-pay

## 2016-12-02 ENCOUNTER — Telehealth: Payer: Self-pay

## 2016-12-02 DIAGNOSIS — C154 Malignant neoplasm of middle third of esophagus: Secondary | ICD-10-CM

## 2016-12-02 MED ORDER — CAPECITABINE 500 MG PO TABS: ORAL_TABLET | ORAL | 0 refills | Status: DC

## 2016-12-02 NOTE — Telephone Encounter (Signed)
Call placed to confirm when the pt was taking Xeloda and confirm his appt on 8/7.

## 2016-12-03 ENCOUNTER — Telehealth: Payer: Self-pay | Admitting: Oncology

## 2016-12-03 ENCOUNTER — Ambulatory Visit: Payer: BLUE CROSS/BLUE SHIELD

## 2016-12-03 ENCOUNTER — Ambulatory Visit (HOSPITAL_BASED_OUTPATIENT_CLINIC_OR_DEPARTMENT_OTHER): Payer: BLUE CROSS/BLUE SHIELD | Admitting: Oncology

## 2016-12-03 ENCOUNTER — Other Ambulatory Visit (HOSPITAL_BASED_OUTPATIENT_CLINIC_OR_DEPARTMENT_OTHER): Payer: BLUE CROSS/BLUE SHIELD

## 2016-12-03 VITALS — BP 123/83 | HR 104 | Temp 98.3°F | Resp 18 | Ht 71.0 in | Wt 189.6 lb

## 2016-12-03 DIAGNOSIS — I951 Orthostatic hypotension: Secondary | ICD-10-CM

## 2016-12-03 DIAGNOSIS — C154 Malignant neoplasm of middle third of esophagus: Secondary | ICD-10-CM | POA: Diagnosis not present

## 2016-12-03 DIAGNOSIS — C7801 Secondary malignant neoplasm of right lung: Secondary | ICD-10-CM

## 2016-12-03 DIAGNOSIS — C771 Secondary and unspecified malignant neoplasm of intrathoracic lymph nodes: Secondary | ICD-10-CM

## 2016-12-03 DIAGNOSIS — C7951 Secondary malignant neoplasm of bone: Secondary | ICD-10-CM

## 2016-12-03 DIAGNOSIS — R200 Anesthesia of skin: Secondary | ICD-10-CM

## 2016-12-03 DIAGNOSIS — Z95828 Presence of other vascular implants and grafts: Secondary | ICD-10-CM

## 2016-12-03 DIAGNOSIS — R05 Cough: Secondary | ICD-10-CM | POA: Diagnosis not present

## 2016-12-03 DIAGNOSIS — C7949 Secondary malignant neoplasm of other parts of nervous system: Secondary | ICD-10-CM | POA: Diagnosis not present

## 2016-12-03 LAB — CBC WITH DIFFERENTIAL/PLATELET
BASO%: 0.1 % (ref 0.0–2.0)
BASOS ABS: 0 10*3/uL (ref 0.0–0.1)
EOS ABS: 0 10*3/uL (ref 0.0–0.5)
EOS%: 0.3 % (ref 0.0–7.0)
HEMATOCRIT: 37 % — AB (ref 38.4–49.9)
HGB: 12.3 g/dL — ABNORMAL LOW (ref 13.0–17.1)
LYMPH#: 0.9 10*3/uL (ref 0.9–3.3)
LYMPH%: 9.5 % — AB (ref 14.0–49.0)
MCH: 32 pg (ref 27.2–33.4)
MCHC: 33.2 g/dL (ref 32.0–36.0)
MCV: 96.4 fL (ref 79.3–98.0)
MONO#: 1 10*3/uL — AB (ref 0.1–0.9)
MONO%: 10.3 % (ref 0.0–14.0)
NEUT#: 7.5 10*3/uL — ABNORMAL HIGH (ref 1.5–6.5)
NEUT%: 79.8 % — AB (ref 39.0–75.0)
PLATELETS: 230 10*3/uL (ref 140–400)
RBC: 3.84 10*6/uL — AB (ref 4.20–5.82)
RDW: 15.4 % — ABNORMAL HIGH (ref 11.0–14.6)
WBC: 9.3 10*3/uL (ref 4.0–10.3)

## 2016-12-03 LAB — COMPREHENSIVE METABOLIC PANEL
ALT: 18 U/L (ref 0–55)
AST: 17 U/L (ref 5–34)
Albumin: 3.1 g/dL — ABNORMAL LOW (ref 3.5–5.0)
Alkaline Phosphatase: 161 U/L — ABNORMAL HIGH (ref 40–150)
Anion Gap: 11 mEq/L (ref 3–11)
BILIRUBIN TOTAL: 0.51 mg/dL (ref 0.20–1.20)
BUN: 9.6 mg/dL (ref 7.0–26.0)
CALCIUM: 10.6 mg/dL — AB (ref 8.4–10.4)
CHLORIDE: 104 meq/L (ref 98–109)
CO2: 27 meq/L (ref 22–29)
CREATININE: 1.1 mg/dL (ref 0.7–1.3)
EGFR: 70 mL/min/{1.73_m2} — AB (ref >90)
Glucose: 139 mg/dl (ref 70–140)
Potassium: 3.4 mEq/L — ABNORMAL LOW (ref 3.5–5.1)
Sodium: 142 mEq/L (ref 136–145)
Total Protein: 6.3 g/dL — ABNORMAL LOW (ref 6.4–8.3)

## 2016-12-03 MED ORDER — HEPARIN SOD (PORK) LOCK FLUSH 100 UNIT/ML IV SOLN
500.0000 [IU] | Freq: Once | INTRAVENOUS | Status: AC | PRN
  Administered 2016-12-03: 500 [IU] via INTRAVENOUS
  Filled 2016-12-03: qty 5

## 2016-12-03 MED ORDER — SODIUM CHLORIDE 0.9% FLUSH
10.0000 mL | INTRAVENOUS | Status: DC | PRN
  Administered 2016-12-03: 10 mL via INTRAVENOUS
  Filled 2016-12-03: qty 10

## 2016-12-03 NOTE — Telephone Encounter (Signed)
Gave patient avs and calendar for August.

## 2016-12-03 NOTE — Progress Notes (Signed)
[facility] OFFICE PROGRESS NOTE   Diagnosis: Esophagus cancer  INTERVAL HISTORY:   Billy Romero returns for a scheduled visit. He has continued Xeloda 7 days on/7 days off. No mouth sores or diarrhea. No dysphagia. He reports a good appetite. He has persistent numbness in the fingers and toes. He has a cough with blood-tinged sputum. No frank hemoptysis. He continues Xarelto. He feels dizzy at times.  Objective:  Vital signs in last 24 hours:  Blood pressure 103/66, pulse (!) 121, temperature 98.3 F (36.8 C), temperature source Oral, resp. rate 18, height 5' 11"  (1.803 m), weight 189 lb 9.6 oz (86 kg), SpO2 100 %.    HEENT: Neck without mass. No thrush or ulcers. Resp: Lungs clear bilaterally, no respiratory distress Cardio: Regular rate and rhythm, tachycardia GI: No hepatomegaly, nontender, no mass Vascular: No leg edema    Portacath/PICC-without erythema  Lab Results:  Lab Results  Component Value Date   WBC 9.3 12/03/2016   HGB 12.3 (L) 12/03/2016   HCT 37.0 (L) 12/03/2016   MCV 96.4 12/03/2016   PLT 230 12/03/2016   NEUTROABS 7.5 (H) 12/03/2016    CMP     Component Value Date/Time   NA 142 12/03/2016 1328   K 3.4 (L) 12/03/2016 1328   CL 105 06/14/2016 0339   CO2 27 12/03/2016 1328   GLUCOSE 139 12/03/2016 1328   BUN 9.6 12/03/2016 1328   CREATININE 1.1 12/03/2016 1328   CALCIUM 10.6 (H) 12/03/2016 1328   PROT 6.3 (L) 12/03/2016 1328   ALBUMIN 3.1 (L) 12/03/2016 1328   AST 17 12/03/2016 1328   ALT 18 12/03/2016 1328   ALKPHOS 161 (H) 12/03/2016 1328   BILITOT 0.51 12/03/2016 1328   GFRNONAA >60 06/14/2016 0339   GFRAA >60 06/14/2016 0339    Medications: I have reviewed the patient's current medications.  Assessment/Plan: 1. 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 Radiation completed 06/10/2014  PET scan 07/01/2014 revealed improvement in the hypermetabolic esophagus mass and mediastinal lymphadenopathy  upper endoscopy 04/20/2015 -mid esophagus with mild narrowing , 2 small nodules measuring approximate 5 mm -biopsy with scant and detached fragments with high-grade squamous dysplasia , brushings negative for malignant cells.  CT chest/abdomen/pelvis 05/25/2015 with development of bilateral pulmonary nodules; paratracheal and subcarinal nodes improved/similar; new small lower paraesophageal nodes which are indeterminate; esophageal wall thickening improved since the prior PET; no evidence of metastatic disease in the abdomen or pelvis.  PET scan 06/16/2015 revealed hypermetabolic lung nodules, hypermetabolic mediastinal nodes, hypermetabolism at the mid to distal esophagus has improved  bronchoscopy/EBUS on 07/04/2015 confirmed squamous cell carcinoma involving right lower lobe brushings, right bronchial washings, a level 10 R lymph node biopsy, and right lower lobe biopsy  PD-L1 no expression on the right lower lobe biopsy (less than 1%)  Cycle 1 Pembrolizumab 07/26/2015  Cycle 2 Pembrolizumab 08/16/2015  Cycle 3 Pembrolizumab 09/06/2015  Cycle 4 Pembrolizumab 09/27/2015  CT chest 10/11/2015-stable/decreased size of pulmonary nodules, enlargement of several mediastinal nodes  Cycle 5 Pembrolizumab 10/18/2015  Cycle 6 Pembrolizumab 11/08/2015   Cycle 7 Pembrolizumab08/05/2015  Cycle 8 Pembrolizumab08/23/2017  CT chest 01/15/2016-slight progression of mediastinal/upper abdominal lymphadenopathy and enlargement of a right lower lobe nodule  Cycle 9 Pembrolizumab 01/17/2016  Cycle 10 Pembrolizumab10/02/2016  Cycle 11 Pembrolizumab 02/28/2016  Cycle 12 Pembrolizumab  03/20/2016  CT chest 04/25/2016-minimal progression of chest lymphadenopathy and lung nodules, thickening at the mid thoracic esophagus  MRI brain 04/30/2016-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 with involvement of the right orbit, no brain metastases  Palliative radiation right sphenoid lesion beginning 05/02/2016  CT chest 06/13/2016-left lower lobe pulmonary embolus, increased mediastinal and hilar adenopathy, septal thickening and clustered nodules in the right lower lobe, gastrohepatic lymphadenopathy  Cycle 1 FOLFOX 06/24/2016  Cycle 2 FOLFOX 07/15/2016  Cycle 3 FOLFOX 07/29/2016  Cycle 4 FOLFOX 08/12/2016  Cycle 5 FOLFOX 08/27/2016  Restaging CTs of the head and chest on 09/09/2016-decreased hilar/mediastinal lymphadenopathy, decreased lung nodules, stable esophageal thickening. Decreased right orbital mass, progressive calvarial metastases compared to an MRI from 04/30/2016  Cycle 6 FOLFOX 09/10/2016  Cycle 7 FOLFOX 09/25/2016  Xeloda initiated 7 days on/7 days off 11/06/2016  Chest CT 11/27/2016-persistent midesophagus thickening, increased chest lymphadenopathy and multifocal nodularity/septal thickening bilaterally, new upper abdominal adenopathy  2. Solid/liquid dysphagia secondary to #1  Jejunostomy feeding tube placement 04/07/2014  Feeding tube removed 05/30/2014.  3. Small bowel obstruction following placement of the jejunostomy feeding tube-resolved  4. History of ulcerative colitis  5. Aspiration pneumonia December 2015  6. 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 Fever/cough/dyspnea 04/30/2016-nasal swab positive for influenza A , chest x-ray with a left lower lobe Pneumonia -treated with Tamiflu and Levaquin   8. Admission 06/13/2016 with a cough/left-sided chest pain/dyspnea/tachycardia/low-grade  fever-pulmonary embolism confirmed on a chest CT, progressive chest adenopathy, inflammatory changes versus lymphatic tumor spread in the right lower lobe.Maintained on Xarelto.  9. Port-A-Cath placement 06/11/2016  10. Dysphagia. Barium swallow 05/01/2016 with circumferential narrowing of the midesophagus causing moderate degree of stricture which would not allow the 13 mm barium tablet to pass. The dysphagia improved spontaneously. He developed recurrent dysphagia to solids 06/15/2016. He is tolerating liquids.  11. Recurrent syncope events-borderline low cortisol 07/08/2016, symptoms improved with hydrocortisone replacement  12. 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 I reviewed CT images with Billy Romero and his wife. We discussed treatment options.  We discussed salvage chemotherapy with an irinotecan based regimen. We also discussed referral to Dr. Fanny Skates to consider enrollment on a clinical trial for patients with squamous cell carcinoma the esophagus. He agrees to see Dr. Fanny Skates again.  He is orthostatic in the office today. He declined IV fluids. We recommended he push fluids.  Billy Romero will return for an office visit in 2 weeks.  30 minutes were spent with patient today. The majority of the time was used for counseling and coordination of care.  Donneta Romberg, MD  12/03/2016  2:23 PM

## 2016-12-03 NOTE — Telephone Encounter (Signed)
Called [facility] to cancel Xeloda script. Pt will be seen in office today to discuss treatment plan.

## 2016-12-03 NOTE — Addendum Note (Signed)
Addended by: Rosalio Macadamia C on: 12/03/2016 11:30 AM   Modules accepted: Orders

## 2016-12-05 ENCOUNTER — Other Ambulatory Visit: Payer: Self-pay | Admitting: *Deleted

## 2016-12-05 DIAGNOSIS — C154 Malignant neoplasm of middle third of esophagus: Secondary | ICD-10-CM

## 2016-12-05 MED ORDER — RIVAROXABAN 20 MG PO TABS: 20.0000 mg | ORAL_TABLET | Freq: Every day | ORAL | 11 refills | Status: AC

## 2016-12-10 ENCOUNTER — Telehealth: Payer: Self-pay | Admitting: Emergency Medicine

## 2016-12-10 NOTE — Telephone Encounter (Signed)
VM left with patient to call this nurse back.

## 2016-12-10 NOTE — Telephone Encounter (Signed)
-----   Message from Maryanna Shape, NP sent at 12/10/2016  2:09 PM EDT ----- Regarding: Call pt/family - appt Shalice Woodring  Can you please call Mr [place] and see if he has heard from [facility] about appt with them? [name] my schedule next week (and is double booked). Dr Mickey Farber said we can move appt until after appt with [facility] so we have their recommendations.   I need to move him off of this Tuesday. If he still wants to come next week, then I need to move him to another day (Wed morning, possible).  Just let me know what he says and I can take care of notifying scheduling for changes.

## 2016-12-10 NOTE — Telephone Encounter (Signed)
Pt's wife states that [facility] has not called to schedule appt. This nurse sent kim hegerdy a message regarding referral. Patient would like to be seen as planned to give her "peace of mind" per pt. On 8/22

## 2016-12-10 NOTE — Telephone Encounter (Signed)
-----   Message from Maryanna Shape, NP sent at 12/10/2016  2:09 PM EDT ----- Regarding: Call pt/family - appt Billy Romero  Can you please call Billy Romero and see if he has heard from [facility] about appt with them? [name] my schedule next week (and is double booked). Dr Mickey Farber said we can move appt until after appt with [facility] so we have their recommendations.   I need to move him off of this Tuesday. If he still wants to come next week, then I need to move him to another day (Wed morning, possible).  Just let me know what he says and I can take care of notifying scheduling for changes.

## 2016-12-11 ENCOUNTER — Telehealth: Payer: Self-pay | Admitting: Oncology

## 2016-12-11 ENCOUNTER — Telehealth: Payer: Self-pay

## 2016-12-11 NOTE — Telephone Encounter (Signed)
Pt appt with Dr. Fanny Skates is 12/19/16@10 :00. Pt is aware

## 2016-12-11 NOTE — Telephone Encounter (Signed)
Spoke with patients wife and she is aware of the new appt date and time per inbasket 12/10/16    Billy Romero

## 2016-12-15 ENCOUNTER — Encounter (HOSPITAL_COMMUNITY): Payer: Self-pay | Admitting: *Deleted

## 2016-12-15 ENCOUNTER — Emergency Department (HOSPITAL_COMMUNITY): Payer: BLUE CROSS/BLUE SHIELD

## 2016-12-15 ENCOUNTER — Inpatient Hospital Stay (HOSPITAL_COMMUNITY)
Admission: EM | Admit: 2016-12-15 | Discharge: 2016-12-18 | DRG: 312 | Disposition: A | Discharge status: Home / Self Care | Payer: BLUE CROSS/BLUE SHIELD | Attending: Internal Medicine | Admitting: Internal Medicine

## 2016-12-15 DIAGNOSIS — C7801 Secondary malignant neoplasm of right lung: Secondary | ICD-10-CM | POA: Diagnosis not present

## 2016-12-15 DIAGNOSIS — I700 Atherosclerosis of aorta: Secondary | ICD-10-CM | POA: Diagnosis present

## 2016-12-15 DIAGNOSIS — C159 Malignant neoplasm of esophagus, unspecified: Secondary | ICD-10-CM | POA: Diagnosis present

## 2016-12-15 DIAGNOSIS — R55 Syncope and collapse: Secondary | ICD-10-CM | POA: Diagnosis not present

## 2016-12-15 DIAGNOSIS — R042 Hemoptysis: Secondary | ICD-10-CM | POA: Diagnosis present

## 2016-12-15 DIAGNOSIS — E876 Hypokalemia: Secondary | ICD-10-CM | POA: Diagnosis present

## 2016-12-15 DIAGNOSIS — C154 Malignant neoplasm of middle third of esophagus: Secondary | ICD-10-CM | POA: Diagnosis not present

## 2016-12-15 DIAGNOSIS — Z79899 Other long term (current) drug therapy: Secondary | ICD-10-CM

## 2016-12-15 DIAGNOSIS — K219 Gastro-esophageal reflux disease without esophagitis: Secondary | ICD-10-CM | POA: Diagnosis present

## 2016-12-15 DIAGNOSIS — T451X5A Adverse effect of antineoplastic and immunosuppressive drugs, initial encounter: Secondary | ICD-10-CM | POA: Diagnosis present

## 2016-12-15 DIAGNOSIS — Z981 Arthrodesis status: Secondary | ICD-10-CM

## 2016-12-15 DIAGNOSIS — Z7901 Long term (current) use of anticoagulants: Secondary | ICD-10-CM

## 2016-12-15 DIAGNOSIS — E860 Dehydration: Secondary | ICD-10-CM | POA: Diagnosis present

## 2016-12-15 DIAGNOSIS — W19XXXA Unspecified fall, initial encounter: Secondary | ICD-10-CM

## 2016-12-15 DIAGNOSIS — Z86711 Personal history of pulmonary embolism: Secondary | ICD-10-CM

## 2016-12-15 DIAGNOSIS — R1310 Dysphagia, unspecified: Secondary | ICD-10-CM | POA: Diagnosis present

## 2016-12-15 DIAGNOSIS — Z8501 Personal history of malignant neoplasm of esophagus: Secondary | ICD-10-CM

## 2016-12-15 DIAGNOSIS — G620 Drug-induced polyneuropathy: Secondary | ICD-10-CM | POA: Diagnosis present

## 2016-12-15 DIAGNOSIS — Z9221 Personal history of antineoplastic chemotherapy: Secondary | ICD-10-CM

## 2016-12-15 DIAGNOSIS — Z8711 Personal history of peptic ulcer disease: Secondary | ICD-10-CM | POA: Diagnosis not present

## 2016-12-15 DIAGNOSIS — Z923 Personal history of irradiation: Secondary | ICD-10-CM

## 2016-12-15 DIAGNOSIS — C7951 Secondary malignant neoplasm of bone: Secondary | ICD-10-CM | POA: Diagnosis present

## 2016-12-15 DIAGNOSIS — E872 Acidosis: Secondary | ICD-10-CM | POA: Diagnosis present

## 2016-12-15 DIAGNOSIS — R000 Tachycardia, unspecified: Secondary | ICD-10-CM

## 2016-12-15 DIAGNOSIS — I951 Orthostatic hypotension: Principal | ICD-10-CM | POA: Diagnosis present

## 2016-12-15 DIAGNOSIS — I2510 Atherosclerotic heart disease of native coronary artery without angina pectoris: Secondary | ICD-10-CM | POA: Diagnosis present

## 2016-12-15 DIAGNOSIS — E2740 Unspecified adrenocortical insufficiency: Secondary | ICD-10-CM | POA: Diagnosis present

## 2016-12-15 DIAGNOSIS — C7949 Secondary malignant neoplasm of other parts of nervous system: Secondary | ICD-10-CM | POA: Diagnosis not present

## 2016-12-15 DIAGNOSIS — C771 Secondary and unspecified malignant neoplasm of intrathoracic lymph nodes: Secondary | ICD-10-CM | POA: Diagnosis not present

## 2016-12-15 LAB — URINALYSIS, ROUTINE W REFLEX MICROSCOPIC
BILIRUBIN URINE: NEGATIVE
GLUCOSE, UA: NEGATIVE mg/dL
Hgb urine dipstick: NEGATIVE
Ketones, ur: NEGATIVE mg/dL
Leukocytes, UA: NEGATIVE
NITRITE: NEGATIVE
PH: 7 (ref 5.0–8.0)
Protein, ur: NEGATIVE mg/dL
SPECIFIC GRAVITY, URINE: 1.011 (ref 1.005–1.030)

## 2016-12-15 LAB — CBC
HEMATOCRIT: 37.3 % — AB (ref 39.0–52.0)
HEMOGLOBIN: 12.6 g/dL — AB (ref 13.0–17.0)
MCH: 31 pg (ref 26.0–34.0)
MCHC: 33.8 g/dL (ref 30.0–36.0)
MCV: 91.6 fL (ref 78.0–100.0)
Platelets: 205 10*3/uL (ref 150–400)
RBC: 4.07 MIL/uL — ABNORMAL LOW (ref 4.22–5.81)
RDW: 14.4 % (ref 11.5–15.5)
WBC: 9.1 10*3/uL (ref 4.0–10.5)

## 2016-12-15 LAB — BASIC METABOLIC PANEL
ANION GAP: 11 (ref 5–15)
BUN: 13 mg/dL (ref 6–20)
CHLORIDE: 103 mmol/L (ref 101–111)
CO2: 27 mmol/L (ref 22–32)
Calcium: 12.5 mg/dL — ABNORMAL HIGH (ref 8.9–10.3)
Creatinine, Ser: 1.17 mg/dL (ref 0.61–1.24)
GFR calc Af Amer: >60 mL/min (ref >60)
GLUCOSE: 116 mg/dL — AB (ref 65–99)
POTASSIUM: 3.3 mmol/L — AB (ref 3.5–5.1)
Sodium: 141 mmol/L (ref 135–145)

## 2016-12-15 LAB — CBG MONITORING, ED: Glucose-Capillary: 122 mg/dL — ABNORMAL HIGH (ref 65–99)

## 2016-12-15 LAB — I-STAT TROPONIN, ED: Troponin i, poc: 0 ng/mL (ref 0.00–0.08)

## 2016-12-15 LAB — I-STAT CG4 LACTIC ACID, ED
LACTIC ACID, VENOUS: 2.04 mmol/L — AB (ref 0.5–1.9)
Lactic Acid, Venous: 1.41 mmol/L (ref 0.5–1.9)

## 2016-12-15 MED ORDER — SODIUM CHLORIDE 0.9 % IV BOLUS (SEPSIS)
1000.0000 mL | Freq: Once | INTRAVENOUS | Status: AC
  Administered 2016-12-15: 1000 mL via INTRAVENOUS

## 2016-12-15 MED ORDER — RIVAROXABAN 20 MG PO TABS
20.0000 mg | ORAL_TABLET | Freq: Every day | ORAL | Status: DC
  Administered 2016-12-15 – 2016-12-17 (×3): 20 mg via ORAL
  Filled 2016-12-15 (×3): qty 1

## 2016-12-15 MED ORDER — BACLOFEN 10 MG PO TABS
10.0000 mg | ORAL_TABLET | Freq: Every day | ORAL | Status: DC
  Filled 2016-12-15 (×2): qty 1

## 2016-12-15 MED ORDER — HYDROCORTISONE 20 MG PO TABS
20.0000 mg | ORAL_TABLET | Freq: Two times a day (BID) | ORAL | Status: DC
  Administered 2016-12-15 – 2016-12-18 (×6): 20 mg via ORAL
  Filled 2016-12-15 (×6): qty 1

## 2016-12-15 MED ORDER — PANTOPRAZOLE SODIUM 40 MG PO TBEC
40.0000 mg | DELAYED_RELEASE_TABLET | Freq: Every day | ORAL | Status: DC
  Administered 2016-12-16 – 2016-12-18 (×3): 40 mg via ORAL
  Filled 2016-12-15 (×3): qty 1

## 2016-12-15 MED ORDER — POTASSIUM CHLORIDE CRYS ER 20 MEQ PO TBCR
40.0000 meq | EXTENDED_RELEASE_TABLET | Freq: Once | ORAL | Status: AC
  Administered 2016-12-15: 40 meq via ORAL
  Filled 2016-12-15: qty 2

## 2016-12-15 MED ORDER — PROMETHAZINE HCL 25 MG PO TABS
25.0000 mg | ORAL_TABLET | Freq: Four times a day (QID) | ORAL | Status: DC | PRN
  Administered 2016-12-15 – 2016-12-18 (×3): 25 mg via ORAL
  Filled 2016-12-15 (×3): qty 1

## 2016-12-15 MED ORDER — SODIUM CHLORIDE 0.9 % IV SOLN
INTRAVENOUS | Status: DC
  Administered 2016-12-15 – 2016-12-17 (×5): via INTRAVENOUS

## 2016-12-15 MED ORDER — POTASSIUM CHLORIDE CRYS ER 20 MEQ PO TBCR
20.0000 meq | EXTENDED_RELEASE_TABLET | Freq: Once | ORAL | Status: DC
Start: 2016-12-15 — End: 2016-12-15
  Filled 2016-12-15: qty 1

## 2016-12-15 MED ORDER — HYDROCODONE-ACETAMINOPHEN 5-325 MG PO TABS
1.0000 | ORAL_TABLET | Freq: Three times a day (TID) | ORAL | Status: DC | PRN
Start: 2016-12-15 — End: 2016-12-16
  Administered 2016-12-15 – 2016-12-16 (×2): 1 via ORAL
  Filled 2016-12-15 (×2): qty 1

## 2016-12-15 MED ORDER — IOPAMIDOL (ISOVUE-370) INJECTION 76%
INTRAVENOUS | Status: AC
  Administered 2016-12-15: 100 mL
  Filled 2016-12-15: qty 100

## 2016-12-15 NOTE — ED Notes (Signed)
Asked hospitalist to please change pt K+ to powder vs pill d/t pt esophageal ca and unable to swallow

## 2016-12-15 NOTE — ED Provider Notes (Signed)
[facility] DEPT Provider Note   CSN: [id number] Arrival date & time: 12/15/16  1231     History   Chief Complaint Chief Complaint  Patient presents with  . Weakness  . Fall    HPI Billy Romero is a 61 y.o. 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 The patient is followed by Dr. Benay Spice in Oncology. This morning the patient was getting out of bed when he noticed a right-sided headache that he describes as throbbing m and associated with photophobia. He states that he gets this headache every morning and is only relieved after he takes his hydrocodone medication. The patient states after getting up out of bed he was walking to the bathroom when he felt a sudden onset of shortness breath, started coughing with blood streaked sputum production and then had a syncopal episode lasting approximately 5-15 seconds. He is unsure if he hit his head during the event. He notes that his wife did fall on top of him when he fell as he took her down with him. After awakening the patient noted no pain however he did become very nauseous and felt like he was going to vomit on his way to the hospital. The patient notes that he has been having syncopal episodes and falling more frequently over the last several months. He states that they usually happen in the same way. He has fallen every day this week. He notes that his cough with blood-streaked sputum has increased. No gross hemoptysis. He denies chest pain prior to the event or following the event. The patient notes that he has a history of blurry vision out of his left eye but notes no acute changes today. Patient recently had a CT chest with contrast on 11/27/2016 with increased metastasis to mediastinal and by lateral hilar lymph nodes throughout the lungs and multiple pulmonary nodules. Patient  was resting in by his oncologist on 12/03/2016. At this time he had blood-streaked sputum as well. He was also noted to be tachycardic on his last several visits. The patient denies missing any doses for his Xarelto. He denies fever, chills, seizure like activity, chest pain, doe, diarrhea, emesis, abdominal pain, leg swelling, skin changes, new joint pain, arm or leg weakness, numbness or tingling of the extremities, or current headache.    HPI  Past Medical History:  Diagnosis Date  . Chronic back pain   . Complication of anesthesia    WOKE UP DURING COLONOSCOPY  . Esophageal cancer ([place]) 03/29/14  . GERD (gastroesophageal reflux disease)   . Pneumonia    May 27, 2016, was + for flu also  . Primary cancer of esophagus with metastasis to other site [facility]) 07/04/2015    Patient Active Problem List   Diagnosis Date Noted  . Encounter for antineoplastic chemotherapy 07/29/2016  . Port catheter in place 06/18/2016  . Acute pulmonary embolism ([place]) 06/13/2016  . SIRS (systemic inflammatory response syndrome) ([place]) 06/13/2016  . Goals of care, counseling/discussion 06/06/2016  . Bone metastasis ([place]) 05/03/2016  . Syncope 04/30/2016  . 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 Nausea with vomiting   . Ileus, postoperative ([place]) 04/15/2014  . SBO (small bowel obstruction) ([place])   . Chronic back pain 04/05/2014  . Dysphagia, pharyngoesophageal phase 04/05/2014  . Protein-calorie malnutrition, severe ([place]) 04/05/2014  .  Dysphagia 04/05/2014  . Malnutrition of moderate degree ([place]) 04/05/2014    Past Surgical History:  Procedure Laterality Date  . APPENDECTOMY    . CERVICAL FUSION    . EYE SURGERY     PTEGERIUM   BIL EYES  . HAND SURGERY     DUPRAGENS CONTRACTURE  RT HAND  . IR GENERIC HISTORICAL  06/11/2016   IR US GUIDE VASC ACCESS RIGHT 06/11/2016 [facility]  . IR GENERIC HISTORICAL  06/11/2016   IR FLUORO GUIDE PORT INSERTION RIGHT 06/11/2016  [facility]  . KNEE ARTHROSCOPY     LEFT  . PEG PLACEMENT N/A 04/06/2014   Procedure: OPEN JEJUNOSTOMY TUBE PLACEMENT;  Surgeon: Armandina Gemma, MD;  Location: [facility] ORS;  Service: General;  Laterality: N/A;  . VIDEO BRONCHOSCOPY WITH ENDOBRONCHIAL ULTRASOUND N/A 07/04/2015   Procedure: VIDEO BRONCHOSCOPY WITH ENDOBRONCHIAL ULTRASOUND with brushings and biopsies.;  Surgeon: Grace Isaac, MD;  Location: [facility];  Service: Thoracic;  Laterality: N/A;       Home Medications    Prior to Admission medications   Medication Sig Start Date End Date Taking? Authorizing Provider  baclofen (LIORESAL) 10 MG tablet Take 10 mg by mouth at bedtime.  01/12/15   [provider]  diclofenac sodium (VOLTAREN) 1 % GEL Apply 4 g topically 4 (four) times daily as needed (pain).  03/27/16   [provider]  esomeprazole (NEXIUM) 20 MG capsule Take 20 mg by mouth daily at 12 noon.    [provider]  HYDROcodone-acetaminophen (NORCO/VICODIN) 5-325 MG tablet Take 1 tablet by mouth 3 (three) times daily as needed for moderate pain. for pain 12/20/15   Ladell Pier, MD  hydrocortisone (CORTEF) 20 MG tablet Take 1 tablet (20 mg total) by mouth 2 (two) times daily. 09/09/16   Ladell Pier, MD  lidocaine-prilocaine (EMLA) cream Apply to port 1 hour prior to port access and cover with plastic wrap 06/24/16   Ladell Pier, MD  polyethylene glycol (MIRALAX / GLYCOLAX) packet Take 17 g by mouth daily.    [provider]  potassium chloride SA (K-DUR,KLOR-CON) 20 MEQ tablet Take 1 tablet (20 mEq total) by mouth daily. 11/27/16   Owens Shark, NP  promethazine (PHENERGAN) 25 MG tablet Take 1 tablet (25 mg total) by mouth every 6 (six) hours as needed for nausea or vomiting. 11/26/16   Ladell Pier, MD  rivaroxaban (XARELTO) 20 MG TABS tablet Take 1 tablet (20 mg total) by mouth daily with supper. 12/05/16   Ladell Pier, MD    Family History Family History  Problem Relation Age  of Onset  . Cancer Father     Social History Social History  Substance Use Topics  . Smoking status: Never Smoker  . Smokeless tobacco: Never Used  . Alcohol use No     Allergies   Patient has no known allergies.   Review of Systems Review of Systems  All other systems reviewed and are negative.    Physical Exam Updated Vital Signs BP 117/78   Pulse (!) 111   Temp 98.4 F (36.9 C) (Oral)   Resp 18   Ht 5\' 11"  (1.803 m)   Wt 85.7 kg (189 lb)   SpO2 98%   BMI 26.36 kg/m   Physical Exam  Constitutional: He appears well-developed and well-nourished.  HENT:  Head: Normocephalic and atraumatic. Head is without raccoon's eyes and without Battle's sign.  Right Ear: Hearing, tympanic membrane, external ear and ear  canal normal. No hemotympanum.  Left Ear: Hearing, tympanic membrane, external ear and ear canal normal. No hemotympanum.  Nose: Nose normal. Right sinus exhibits no maxillary sinus tenderness and no frontal sinus tenderness. Left sinus exhibits no maxillary sinus tenderness and no frontal sinus tenderness.  Mouth/Throat: Uvula is midline and oropharynx is clear and moist. Mucous membranes are dry. No tonsillar exudate.  No CSF ottorhea. White dry tongue.   Eyes: Pupils are equal, round, and reactive to light. Conjunctivae, EOM and lids are normal. Right eye exhibits no discharge. Left eye exhibits no discharge. Right conjunctiva is not injected. Left conjunctiva is not injected. No scleral icterus. Right eye exhibits no nystagmus. Left eye exhibits no nystagmus.  Neck: Trachea normal, normal range of motion and phonation normal. Neck supple. No spinous process tenderness present. No neck rigidity. Normal range of motion present.  Cardiovascular: Regular rhythm and intact distal pulses.  Tachycardia present.   No murmur heard. Pulses:      Radial pulses are 2+ on the right side, and 2+ on the left side.       Femoral pulses are 2+ on the right side, and 2+ on the  left side.      Dorsalis pedis pulses are 2+ on the right side, and 2+ on the left side.       Posterior tibial pulses are 2+ on the right side, and 2+ on the left side.  No lower extremity swelling or edema. Calves symmetric in size bilaterally.   Pulmonary/Chest: Effort normal. He has wheezes. He exhibits no tenderness.  Chest port noted. No surrounding erythema or discharge.  Abdominal: Soft. Bowel sounds are normal. There is no tenderness. There is no rigidity, no rebound, no guarding and no CVA tenderness.  Musculoskeletal: He exhibits no edema.  Lymphadenopathy:    He has no cervical adenopathy.  Neurological: He is alert.  Speech clear. Follows commands. No facial droop. PERRLA. EOMI. Normal peripheral fields. CN III-XII intact.  Grossly moves all extremities 4 without ataxia. Coordination intact. Able and appropriate strength for age to upper and lower extremities bilaterally including grip strength. Sensation to light touch intact bilaterally for upper and lower. Patellar deep tendon reflex 2+ and equal bilaterally. Normal finger to nose and rapid alternating movements. Normal heel to shin balance. Negative Romberg. No pronator drift. Gait difficult.   Skin: Skin is warm and dry. No rash noted. He is not diaphoretic.  Psychiatric: He has a normal mood and affect.  Nursing note and vitals reviewed.    ED Treatments / Results  Labs (all labs ordered are listed, but only abnormal results are displayed) Labs Reviewed  BASIC METABOLIC PANEL - Abnormal; Notable for the following:       Result Value   Potassium 3.3 (*)    Glucose, Bld 116 (*)    Calcium 12.5 (*)    All other components within normal limits  CBC - Abnormal; Notable for the following:    RBC 4.07 (*)    Hemoglobin 12.6 (*)    HCT 37.3 (*)    All other components within normal limits  CBG MONITORING, ED - Abnormal; Notable for the following:    Glucose-Capillary 122 (*)    All other components within normal  limits  I-STAT CG4 LACTIC ACID, ED - Abnormal; Notable for the following:    Lactic Acid, Venous 2.04 (*)    All other components within normal limits  URINALYSIS, ROUTINE W REFLEX MICROSCOPIC  I-STAT TROPONIN, ED  EKG  EKG Interpretation  Date/Time:  Sunday December 15 2016 12:47:02 EDT Ventricular Rate:  117 PR Interval:    QRS Duration: 84 QT Interval:  307 QTC Calculation: 429 R Axis:   -20 Text Interpretation:  Sinus tachycardia Borderline left axis deviation Low voltage, precordial leads No significant change since last tracing Confirmed by Isla Pence [phone]) on 12/15/2016 1:50:01 PM       Radiology No results found.  Procedures Procedures (including critical care time)  Medications Ordered in ED Medications - No data to display   Initial Impression / Assessment and Plan / ED Course  I have reviewed the triage vital signs and the nursing notes.  Pertinent labs & imaging results that were available during my care of the patient were reviewed by me and considered in my medical decision making (see chart for details).     This is a 61 year old male with esophageal cancer with metastases and PE on throughout the presenting for fall. Patient has had increasing syncopal episodes with fall with prodrome of shortness of breath and cough with blood-tinged sputum. Patient also noting daily right-sided headaches. On presentation the patient is noted to be tachycardic. This is been ongoing tachycardia for some time per chart review. Patient is without fever, hypotension, hypoxia or tachypnea. Patient is not septic appearing on presentation. Patient with normal neurologic exam but difficult gait. CBC, CMP, lactic acid, troponin and EKG ordered. There is concern for worsening PE due to patient's shortness of breath, hemoptysis, history of cancer and syncopal episode. Will order CT. Patient is on blood thinners and has had frequent falls. He is unsure if he has any head trauma from  these falls. Will order CT head and neck to evaluate for this.  EKG with sinus tachycardia consistent with previous EKGs. I-STAT troponin negative (0.00). I-STAT lactic acid 2.04. BMP with potassium noted at 3.3. Replace orally. Glucose 116, no anion gap acidosis. No acute kidney injury. Kidney function preserved. Noted elevated calcium consistent with previous findings but noted to be slightly more elevated at today's visit. CBC appears stable from previous visits. Patient was noted orthostatic vital signs. Vital hydrated with IV fluids.  CT head with extensive lytic bony metastasis and areas of extension into the periosteum causing mass effect without midline shift. No evidence of subdural or epidural bleed. CT cervical spine without fracture. There is noted carotid artery calcification bilaterally. CT chest without demonstrable PE. It is noted that the tumor encases the proximal pulmonary arteries. Known mass of the midesophagus consistent with the patient's cancer. Known pulmonary nodules consistent with patient's metastasis.  Patient appears dehydrated and is having frequent falls due to his orthostasis. Patient also noted to have carotid artery calcification and may need further workup for this. Suspect the patient's dehydration is the cause for the elevated lactate. Will plan to admit the patient. Consult placed to hospitalist. Hospitalist agrees to admit the patient and take over care. Discussed this with the patient and family who is in agreement with plan. Patient appears stable at time of handover of care.  Patient case seen and discussed with Dr. Gilford Raid who is in agreement with plan.   Final Clinical Impressions(s) / ED Diagnoses   Final diagnoses:  History of esophageal cancer  Fall, initial encounter  Orthostasis  Tachycardia    New Prescriptions New Prescriptions   No medications on file     Lorelle Gibbs 12/15/16 1602    Isla Pence, MD 12/17/16 [phone]

## 2016-12-15 NOTE — ED Triage Notes (Signed)
Pt presents with increased weakness and a today, has been falling frequently. Pt states he is coughing more and is producing blood in sputum, Dr Johnanna Schneiders aware, pt here today due to increased weakness and multiple falls

## 2016-12-15 NOTE — H&P (Signed)
History and Physical    Billy Romero:[id number] DOB: 02-19-1956 DOA: 12/15/2016  PCP: Lujean Amel, MD  Patient coming from: Home  Chief Complaint: Syncope   HPI: Billy Romero is a 61 y.o. male with medical history significant of metastatic esophageal cancer followed by Dr. Benay Spice, history of pulmonary embolism diagnosed in February 2018 on Xarelto, who presents with shortness of breath, cough, dizziness, syncopal episodes at home. He states that this is been ongoing for a while. He followed up with Dr. Benay Spice couple of weeks ago, was told to increase fluid intake. He states that he has been drinking iced tea and grapefruit juice. His syncopal episodes have increased in frequency and he seeks care in the emergency department today. Currently, at rest, he feels well. He denies any fevers or chills, has some right-sided chest pain that worsens with cough, ongoing dry cough, but no nausea, vomiting, diarrhea or abdominal pain. He denies any urinary complaints, peripheral edema or rash/ulcer/wound. He also complains of tearing, crusting of the right eye without any redness, as well as progressive hearing loss.  ED Course: CT chest was negative for pulmonary embolus. Showed tumor encasing proximal pulmonary arteries, mid esophageal mass, multiple pulmonary nodular lesions, multilevel adenopathy. CT head and CT cervical spine revealed extensive lytic bony metastases, mastoid disease bilaterally, more severe on the right. Labs revealed elevated lactic acidosis of 2.04, hypercalcemia of 12.5. Orthostatic vital signs 116/84 --> 78/34. [facility] asked to admit for dehydration, orthostatic hypotension.  Review of Systems: As per HPI otherwise 10 point review of systems negative.   Past Medical History:  Diagnosis Date  . Chronic back pain   . Complication of anesthesia    WOKE UP DURING COLONOSCOPY  . Esophageal cancer ([name]) 03/29/14  . GERD (gastroesophageal reflux disease)   . Pneumonia    May 27, 2016, was + for flu also  . Primary cancer of esophagus with metastasis to other site [facility]) 07/04/2015    Past Surgical History:  Procedure Laterality Date  . APPENDECTOMY    . CERVICAL FUSION    . EYE SURGERY     PTEGERIUM   BIL EYES  . HAND SURGERY     DUPRAGENS CONTRACTURE  RT HAND  . IR GENERIC HISTORICAL  06/11/2016   IR US GUIDE VASC ACCESS RIGHT 06/11/2016 [facility]  . IR GENERIC HISTORICAL  06/11/2016   IR FLUORO GUIDE PORT INSERTION RIGHT 06/11/2016 [facility]  . KNEE ARTHROSCOPY     LEFT  . PEG PLACEMENT N/A 04/06/2014   Procedure: OPEN JEJUNOSTOMY TUBE PLACEMENT;  Surgeon: Armandina Gemma, MD;  Location: [facility];  Service: General;  Laterality: N/A;  . VIDEO BRONCHOSCOPY WITH ENDOBRONCHIAL ULTRASOUND N/A 07/04/2015   Procedure: VIDEO BRONCHOSCOPY WITH ENDOBRONCHIAL ULTRASOUND with brushings and biopsies.;  Surgeon: Grace Isaac, MD;  Location: [place];  Service: Thoracic;  Laterality: N/A;     reports that he has never smoked. He has never used smokeless tobacco. He reports that he does not drink alcohol or use drugs.  No Known Allergies  Family History  Problem Relation Age of Onset  . Cancer Father     Prior to Admission medications   Medication Sig Start Date End Date Taking? Authorizing Provider  baclofen (LIORESAL) 10 MG tablet Take 10 mg by mouth at bedtime.  01/12/15   [provider]  diclofenac sodium (VOLTAREN) 1 % GEL Apply 4 g topically 4 (four) times daily as needed (pain).  03/27/16   [provider]  esomeprazole (NEXIUM) 20 MG capsule Take 20 mg by mouth daily at 12 noon.    [provider]  HYDROcodone-acetaminophen (NORCO/VICODIN) 5-325 MG tablet Take 1 tablet by mouth 3 (three) times daily as needed for moderate pain. for pain 12/20/15   Ladell Pier, MD  hydrocortisone (CORTEF) 20 MG tablet Take 1 tablet (20 mg total) by mouth 2 (two) times daily. 09/09/16   Ladell Pier, MD  lidocaine-prilocaine (EMLA)  cream Apply to port 1 hour prior to port access and cover with plastic wrap 06/24/16   Ladell Pier, MD  polyethylene glycol (MIRALAX / GLYCOLAX) packet Take 17 g by mouth daily.    [provider]  potassium chloride SA (K-DUR,KLOR-CON) 20 MEQ tablet Take 1 tablet (20 mEq total) by mouth daily. 11/27/16   Owens Shark, NP  promethazine (PHENERGAN) 25 MG tablet Take 1 tablet (25 mg total) by mouth every 6 (six) hours as needed for nausea or vomiting. 11/26/16   Ladell Pier, MD  rivaroxaban (XARELTO) 20 MG TABS tablet Take 1 tablet (20 mg total) by mouth daily with supper. 12/05/16   Ladell Pier, MD    Physical Exam: Vitals:   12/15/16 1255 12/15/16 1523 12/15/16 1525 12/15/16 1537  BP: 117/78 116/84 107/79 (!) 85/67  Pulse: (!) 111 (!) 105 (!) 109 (!) 104  Resp: 18 (!) 25 20 (!) 24  Temp: 98.4 F (36.9 C)     TempSrc: Oral     SpO2: 98% 95% 99% 97%  Weight:      Height:        Constitutional: NAD, calm, comfortable Eyes: PERRL, lids and conjunctivae normal ENMT: Mucous membranes are moist. Posterior pharynx clear of any exudate or lesions.Normal dentition.  Neck: normal, supple, no masses, no thyromegaly Respiratory: clear to auscultation bilaterally, no wheezing, no crackles. Normal respiratory effort. No accessory muscle use.  Cardiovascular: Regular rate and rhythm, no murmurs / rubs / gallops. No extremity edema.  Abdomen: no tenderness, no masses palpated. No hepatosplenomegaly. Bowel sounds positive.  Musculoskeletal: no clubbing / cyanosis. No joint deformity upper and lower extremities. Good ROM, no contractures. Normal muscle tone.  Skin: no rashes, lesions, ulcers. No induration Neurologic: CN 2-12 grossly intact. Strength 5/5 in all 4.  Psychiatric: Normal judgment and insight. Alert and oriented x 3. Normal mood.   Labs on Admission: I have personally reviewed following labs and imaging studies  CBC:  Recent Labs Lab 12/15/16 1321  WBC 9.1  HGB  12.6*  HCT 37.3*  MCV 91.6  PLT 881   Basic Metabolic Panel:  Recent Labs Lab 12/15/16 1321  NA 141  K 3.3*  CL 103  CO2 27  GLUCOSE 116*  BUN 13  CREATININE 1.17  CALCIUM 12.5*   GFR: Estimated Creatinine Clearance: 68.8 mL/min (by C-G formula based on SCr of 1.17 mg/dL). Liver Function Tests: No results for input(s): AST, ALT, ALKPHOS, BILITOT, PROT, ALBUMIN in the last 168 hours. No results for input(s): LIPASE, AMYLASE in the last 168 hours. No results for input(s): AMMONIA in the last 168 hours. Coagulation Profile: No results for input(s): INR, PROTIME in the last 168 hours. Cardiac Enzymes: No results for input(s): CKTOTAL, CKMB, CKMBINDEX, TROPONINI in the last 168 hours. BNP (last 3 results) No results for input(s): PROBNP in the last 8760 hours. HbA1C: No results for input(s): HGBA1C in the last 72 hours. CBG:  Recent Labs Lab 12/15/16 1251  GLUCAP 122*   Lipid Profile: No results  for input(s): CHOL, HDL, LDLCALC, TRIG, CHOLHDL, LDLDIRECT in the last 72 hours. Thyroid Function Tests: No results for input(s): TSH, T4TOTAL, FREET4, T3FREE, THYROIDAB in the last 72 hours. Anemia Panel: No results for input(s): VITAMINB12, FOLATE, FERRITIN, TIBC, IRON, RETICCTPCT in the last 72 hours. Urine analysis:    Component Value Date/Time   COLORURINE YELLOW 12/15/2016 1321   APPEARANCEUR CLOUDY (A) 12/15/2016 1321   LABSPEC 1.011 12/15/2016 1321   PHURINE 7.0 12/15/2016 1321   GLUCOSEU NEGATIVE 12/15/2016 1321   HGBUR NEGATIVE 12/15/2016 1321   BILIRUBINUR NEGATIVE 12/15/2016 1321   KETONESUR NEGATIVE 12/15/2016 1321   PROTEINUR NEGATIVE 12/15/2016 1321   UROBILINOGEN 1.0 04/11/2014 1830   NITRITE NEGATIVE 12/15/2016 1321   LEUKOCYTESUR NEGATIVE 12/15/2016 1321   Sepsis Labs: !!!!!!!!!!!!!!!!!!!!!!!!!!!!!!!!!!!!!!!!!!!! @LABRCNTIP (procalcitonin:4,lacticidven:4) )No results found for this or any previous visit (from the past 240 hour(s)).   Radiological  Exams on Admission: Ct Head Wo Contrast  Result Date: 12/15/2016 CLINICAL DATA:  Esophageal carcinoma with metastases. Several recent falls. EXAM: CT HEAD WITHOUT CONTRAST CT CERVICAL SPINE WITHOUT CONTRAST TECHNIQUE: Multidetector CT imaging of the head and cervical spine was performed following the standard protocol without intravenous contrast. Multiplanar CT image reconstructions of the cervical spine were also generated. COMPARISON:  Head CT Sep 09, 2016 FINDINGS: CT HEAD FINDINGS Brain: There is mild diffuse atrophy. There is no intra-axial mass or hemorrhage. There are no appreciable subdural or epidural fluid collections. No midline shift. There is slight small vessel disease in the centra semiovale bilaterally. No acute infarct evident. Vascular: No hyperdense vessel. No appreciable vascular calcification. Skull: There is extensive bony metastatic disease throughout the calvarium. There is destruction of the superior midline and adjacent parietal bones with extensive periosteal re-expansion in this area. There is an expansile lytic lesion involving the right frontal bone with localized impression on the right superior frontal lobe due to this extensive bony expansion. There is extensive lytic change throughout the temporal bones bilaterally, more severe on the right than on the left, with periosteal expansion on the right causing modest mass effect on the posterior frontal and temporal lobes on the right due to periosteal expansion. Tumor is noted throughout the sphenoid bones, more severe on the left than on the right. Sinuses/Orbits: There is mild mucosal thickening in the anterior right maxillary antrum. There is mild mucosal thickening in several ethmoid air cells. No intraorbital lesions are evident. Other: There is opacification in the mastoid air cells bilaterally, more severe on the right than on the left. CT CERVICAL SPINE FINDINGS Alignment: There is no evidence spondylolisthesis. Skull base  and vertebrae: There are lytic metastases involving the skullbase and posterior colitis without mass effect on the posterior fossa or midbrain regions. Craniocervical junction region appears normal. There are no appreciable blastic or lytic lesions in the cervical spine. No fracture evident. There is anterior screw and plate fixation from K2-I0 with support hardware intact. Soft tissues and spinal canal: Prevertebral soft tissues and predental space regions are normal. There are no paraspinous lesions. No cord or canal hematoma evident. Disc levels: There are disc spacers at C5-6 and C6-7. There is moderately severe disc space narrowing at C7-T1. There is a prominent anterior osteophyte at C4. There is multilevel facet osteoarthritic change. There is exit foraminal narrowing due to bony hypertrophy at C3-4 bilaterally, with impression on exiting nerve roots bilaterally at this level. There is no frank disc extrusion or high-grade stenosis. Upper chest: No consolidation in the visualized upper lung zones. Air is  noted in the upper cervical esophagus. Other: There is calcification in each carotid artery. IMPRESSION: CT head: Extensive lytic bony metastases throughout the bony calvarium and skullbase. Areas of expansion into the periosteum cause mass effect on the anterior right frontal lobe and to a lesser extent on the right posterior frontal and right temporal lobes. No midline shift. No intra-axial lesion. No subdural or epidural fluid collections are evident. No acute infarct evident. There is mastoid disease bilaterally, more severe on the right than on the left. CT cervical spine: No fracture or spondylolisthesis. Postoperative change from C5-C7 anteriorly. Multilevel osteoarthritic change with exit foraminal narrowing greatest at C3-4 bilaterally. There are foci of carotid artery calcification bilaterally. Electronically Signed   By: Lowella Grip III M.D.   On: 12/15/2016 15:41   Ct Angio Chest Pe W/cm  &/or Wo Cm  Result Date: 12/15/2016 CLINICAL DATA:  Shortness of breath. Metastatic esophageal carcinoma EXAM: CT ANGIOGRAPHY CHEST WITH CONTRAST TECHNIQUE: Multidetector CT imaging of the chest was performed using the standard protocol during bolus administration of intravenous contrast. Multiplanar CT image reconstructions and MIPs were obtained to evaluate the vascular anatomy. CONTRAST:  100 mL Isovue 370 nonionic COMPARISON:  Chest CT November 27, 2016 FINDINGS: Cardiovascular: There is no appreciable pulmonary embolus. Note that adenopathy in pieces several pulmonary arteries. There is marked narrowing of the left upper lobe pulmonary artery due to encasement from tumor. There is no thoracic aortic aneurysm or dissection. The visualized great vessels appear normal. There is a fairly small pericardial effusion present. There is slight calcification in the thoracic aorta. Mediastinum/Nodes: Thyroid is diminutive but unremarkable in appearance. There is extensive adenopathy. In the aortopulmonary window region, there are multiple confluent lymph nodes with the confluence of adenopathy in this area measuring 3.1 x 3.2 cm. There is an adjacent lymph node to the left of the aortic arch measuring 2.8 x 1.8 cm. There is adenopathy in the right paratracheal region measuring 1.9 x 1.2 cm. There is adenopathy throughout the left hilar region with the largest confluence of adenopathy in this area measuring 2.5 x 1.8 cm. There is extensive adenopathy in the right hilar region with the largest confluence of adenopathy in this region measuring 3.3 x 2.7 cm. There is sub- carinal adenopathy the with the largest individual lymph node measuring 1.7 x 1.5 cm. There is a mass in the mid esophagus with marked wall thickening in this area, unchanged from prior study. There is air in the esophagus superior to the mid esophageal mass. There is esophageal wall thickening throughout the distal half of the esophagus. Lungs/Pleura: There  is nodular interstitial thickening throughout much of the right lower lobe, likely due to lymphangitic spread of tumor. There is focal airspace consolidation in the medial aspect of the superior segment of the right lower lobe measuring 2.5 x 2.3 cm. There are smaller nodular opacities elsewhere in the right lower lobe. There is a peripheral nodular opacity abutting the pleura in the posterior segment of the left upper lobe measuring 8 x 8 mm. There is a nodular opacity in the posterior segment of the left upper lobe more inferiorly measuring 1.0 x 0.5 cm. Several small nodular opacities are noted in the left upper lobe more anteriorly, all felt to represent metastatic foci. There is no appreciable pleural effusion. Upper Abdomen: There is incomplete visualization of a lymph node anterior to the medial left diaphragmatic crus measuring 1.6 x 1.2 cm. Visualized upper abdominal structures otherwise appear unremarkable. Musculoskeletal: There is  multilevel degenerative change. There is postoperative change in the lower cervical spine region. No blastic or lytic bone lesions are evident. Review of the MIP images confirms the above findings. IMPRESSION: 1. No demonstrable pulmonary embolus. Note that tumor encases proximal pulmonary arteries with the greatest degree of encasement and narrowing involving the left upper lobe pulmonary artery. 2. Mass midesophagus with thickening of the distal half of the esophageal wall. Appearance unchanged compared to recent study. 3. Multiple pulmonary nodular lesions with the greatest confluent nodularity in the right lower lobe. Suspect lymphangitic spread of tumor in the right lower lobe as well. 4. Multilevel adenopathy, similar to recent study, consistent with extensive metastatic disease. 5.  Mild aortic atherosclerosis. 6.  Fairly small pericardial effusion. Aortic Atherosclerosis (ICD10-I70.0). Electronically Signed   By: Lowella Grip III M.D.   On: 12/15/2016 15:32   Ct  Cervical Spine Wo Contrast  Result Date: 12/15/2016 CLINICAL DATA:  Esophageal carcinoma with metastases. Several recent falls. EXAM: CT HEAD WITHOUT CONTRAST CT CERVICAL SPINE WITHOUT CONTRAST TECHNIQUE: Multidetector CT imaging of the head and cervical spine was performed following the standard protocol without intravenous contrast. Multiplanar CT image reconstructions of the cervical spine were also generated. COMPARISON:  Head CT Sep 09, 2016 FINDINGS: CT HEAD FINDINGS Brain: There is mild diffuse atrophy. There is no intra-axial mass or hemorrhage. There are no appreciable subdural or epidural fluid collections. No midline shift. There is slight small vessel disease in the centra semiovale bilaterally. No acute infarct evident. Vascular: No hyperdense vessel. No appreciable vascular calcification. Skull: There is extensive bony metastatic disease throughout the calvarium. There is destruction of the superior midline and adjacent parietal bones with extensive periosteal re-expansion in this area. There is an expansile lytic lesion involving the right frontal bone with localized impression on the right superior frontal lobe due to this extensive bony expansion. There is extensive lytic change throughout the temporal bones bilaterally, more severe on the right than on the left, with periosteal expansion on the right causing modest mass effect on the posterior frontal and temporal lobes on the right due to periosteal expansion. Tumor is noted throughout the sphenoid bones, more severe on the left than on the right. Sinuses/Orbits: There is mild mucosal thickening in the anterior right maxillary antrum. There is mild mucosal thickening in several ethmoid air cells. No intraorbital lesions are evident. Other: There is opacification in the mastoid air cells bilaterally, more severe on the right than on the left. CT CERVICAL SPINE FINDINGS Alignment: There is no evidence spondylolisthesis. Skull base and vertebrae:  There are lytic metastases involving the skullbase and posterior colitis without mass effect on the posterior fossa or midbrain regions. Craniocervical junction region appears normal. There are no appreciable blastic or lytic lesions in the cervical spine. No fracture evident. There is anterior screw and plate fixation from P5-K9 with support hardware intact. Soft tissues and spinal canal: Prevertebral soft tissues and predental space regions are normal. There are no paraspinous lesions. No cord or canal hematoma evident. Disc levels: There are disc spacers at C5-6 and C6-7. There is moderately severe disc space narrowing at C7-T1. There is a prominent anterior osteophyte at C4. There is multilevel facet osteoarthritic change. There is exit foraminal narrowing due to bony hypertrophy at C3-4 bilaterally, with impression on exiting nerve roots bilaterally at this level. There is no frank disc extrusion or high-grade stenosis. Upper chest: No consolidation in the visualized upper lung zones. Air is noted in the upper cervical esophagus.  Other: There is calcification in each carotid artery. IMPRESSION: CT head: Extensive lytic bony metastases throughout the bony calvarium and skullbase. Areas of expansion into the periosteum cause mass effect on the anterior right frontal lobe and to a lesser extent on the right posterior frontal and right temporal lobes. No midline shift. No intra-axial lesion. No subdural or epidural fluid collections are evident. No acute infarct evident. There is mastoid disease bilaterally, more severe on the right than on the left. CT cervical spine: No fracture or spondylolisthesis. Postoperative change from C5-C7 anteriorly. Multilevel osteoarthritic change with exit foraminal narrowing greatest at C3-4 bilaterally. There are foci of carotid artery calcification bilaterally. Electronically Signed   By: Lowella Grip III M.D.   On: 12/15/2016 15:41    EKG: Independently reviewed. Sinus  tachycardia, left axis deviation.  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 Benay Spice, has appointment with Dr. Fanny Skates for possible clinical trial  -SLP eval   DVT prophylaxis: Xarelto Code Status: Full, discussed with patient and family at bedside  Family Communication: Multiple family members at bedside Disposition Plan: Pending improvement  Consults called: None, added Dr. Benay Spice to care team   Admission status: Inpatient   Severity of Illness: The appropriate patient status for this patient is INPATIENT. Inpatient status is judged to be reasonable and necessary in order to provide the required intensity of service to ensure the patient's safety. The patient's presenting symptoms, physical exam findings, and initial radiographic and laboratory data in the context of their chronic comorbidities is felt to place them at high risk for further clinical deterioration. Furthermore, it is not anticipated that the patient will be medically stable for discharge from the hospital within 2 midnights of admission. The following factors support the patient status of inpatient.   " The patient's presenting symptoms include dizziness, shortness of breath with exertion, pleuritic right-sided chest pain, syncopal episodes. " The worrisome physical exam findings include sinus tachycardia. " The initial  radiographic and laboratory data are worrisome because of mild lactic acidosis, hypokalemia, hypercalcemia. " The chronic co-morbidities include metastatic esophageal squamous cell carcinoma.   * I certify that at the point of admission it is my clinical judgment that the patient will require inpatient hospital care spanning beyond 2 midnights from the point of admission due to high intensity of service, high risk for further deterioration and high frequency of surveillance required.*   Dessa Phi, DO [facility] [email or web address] Password [facility] 12/15/2016, 4:35 PM

## 2016-12-16 DIAGNOSIS — C154 Malignant neoplasm of middle third of esophagus: Secondary | ICD-10-CM

## 2016-12-16 DIAGNOSIS — C771 Secondary and unspecified malignant neoplasm of intrathoracic lymph nodes: Secondary | ICD-10-CM

## 2016-12-16 DIAGNOSIS — C7951 Secondary malignant neoplasm of bone: Secondary | ICD-10-CM

## 2016-12-16 DIAGNOSIS — C7801 Secondary malignant neoplasm of right lung: Secondary | ICD-10-CM

## 2016-12-16 DIAGNOSIS — I951 Orthostatic hypotension: Principal | ICD-10-CM

## 2016-12-16 DIAGNOSIS — C7949 Secondary malignant neoplasm of other parts of nervous system: Secondary | ICD-10-CM

## 2016-12-16 LAB — COMPREHENSIVE METABOLIC PANEL
ALK PHOS: 119 U/L (ref 38–126)
ALT: 14 U/L — AB (ref 17–63)
ANION GAP: 10 (ref 5–15)
AST: 21 U/L (ref 15–41)
Albumin: 3.1 g/dL — ABNORMAL LOW (ref 3.5–5.0)
BILIRUBIN TOTAL: 0.7 mg/dL (ref 0.3–1.2)
BUN: 12 mg/dL (ref 6–20)
CALCIUM: 12 mg/dL — AB (ref 8.9–10.3)
CO2: 26 mmol/L (ref 22–32)
CREATININE: 1.14 mg/dL (ref 0.61–1.24)
Chloride: 106 mmol/L (ref 101–111)
Glucose, Bld: 124 mg/dL — ABNORMAL HIGH (ref 65–99)
Potassium: 3.6 mmol/L (ref 3.5–5.1)
Sodium: 142 mmol/L (ref 135–145)
TOTAL PROTEIN: 6.2 g/dL — AB (ref 6.5–8.1)

## 2016-12-16 LAB — CBC
HCT: 35.7 % — ABNORMAL LOW (ref 39.0–52.0)
HCT: 32.3 % — ABNORMAL LOW (ref 39.0–52.0)
Hemoglobin: 11.9 g/dL — ABNORMAL LOW (ref 13.0–17.0)
Hemoglobin: 10.8 g/dL — ABNORMAL LOW (ref 13.0–17.0)
MCH: 30.2 pg (ref 26.0–34.0)
MCH: 30.7 pg (ref 26.0–34.0)
MCHC: 33.3 g/dL (ref 30.0–36.0)
MCHC: 33.4 g/dL (ref 30.0–36.0)
MCV: 90.6 fL (ref 78.0–100.0)
MCV: 91.8 fL (ref 78.0–100.0)
Platelets: 201 10*3/uL (ref 150–400)
Platelets: 185 10*3/uL (ref 150–400)
RBC: 3.94 MIL/uL — ABNORMAL LOW (ref 4.22–5.81)
RBC: 3.52 MIL/uL — ABNORMAL LOW (ref 4.22–5.81)
RDW: 14.4 % (ref 11.5–15.5)
RDW: 14.5 % (ref 11.5–15.5)
WBC: 8 10*3/uL (ref 4.0–10.5)
WBC: 6.6 10*3/uL (ref 4.0–10.5)

## 2016-12-16 LAB — BASIC METABOLIC PANEL
Anion gap: 8 (ref 5–15)
BUN: 12 mg/dL (ref 6–20)
CALCIUM: 11.3 mg/dL — AB (ref 8.9–10.3)
CO2: 27 mmol/L (ref 22–32)
CREATININE: 1 mg/dL (ref 0.61–1.24)
Chloride: 105 mmol/L (ref 101–111)
Glucose, Bld: 120 mg/dL — ABNORMAL HIGH (ref 65–99)
Potassium: 3.4 mmol/L — ABNORMAL LOW (ref 3.5–5.1)
SODIUM: 140 mmol/L (ref 135–145)

## 2016-12-16 LAB — HEPATIC FUNCTION PANEL
ALT: 14 U/L — ABNORMAL LOW (ref 17–63)
AST: 21 U/L (ref 15–41)
Albumin: 3.1 g/dL — ABNORMAL LOW (ref 3.5–5.0)
Alkaline Phosphatase: 119 U/L (ref 38–126)
BILIRUBIN TOTAL: 0.7 mg/dL (ref 0.3–1.2)
Total Protein: 6.2 g/dL — ABNORMAL LOW (ref 6.5–8.1)

## 2016-12-16 MED ORDER — ONDANSETRON HCL 4 MG/2ML IJ SOLN: 4.0000 mg | Freq: Four times a day (QID) | INTRAMUSCULAR | Status: DC | PRN

## 2016-12-16 MED ORDER — HYDROCODONE-ACETAMINOPHEN 5-325 MG PO TABS
1.0000 | ORAL_TABLET | ORAL | Status: DC | PRN
  Administered 2016-12-16 – 2016-12-18 (×8): 2 via ORAL
  Filled 2016-12-16 (×8): qty 2

## 2016-12-16 MED ORDER — FLUDROCORTISONE ACETATE 0.1 MG PO TABS
0.0500 mg | ORAL_TABLET | Freq: Every day | ORAL | Status: DC
  Administered 2016-12-16 – 2016-12-18 (×3): 0.05 mg via ORAL
  Filled 2016-12-16 (×3): qty 0.5

## 2016-12-16 MED ORDER — SODIUM CHLORIDE 0.9 % IV SOLN
4.0000 mg | Freq: Once | INTRAVENOUS | Status: AC
  Administered 2016-12-16: 4 mg via INTRAVENOUS
  Filled 2016-12-16: qty 5

## 2016-12-16 MED ORDER — MORPHINE SULFATE (PF) 2 MG/ML IV SOLN
2.0000 mg | INTRAVENOUS | Status: DC | PRN
  Administered 2016-12-18: 2 mg via INTRAVENOUS
  Filled 2016-12-16: qty 1

## 2016-12-16 MED ORDER — ACETAMINOPHEN 325 MG PO TABS
650.0000 mg | ORAL_TABLET | Freq: Four times a day (QID) | ORAL | Status: DC | PRN
  Filled 2016-12-16: qty 2

## 2016-12-16 NOTE — Evaluation (Signed)
Occupational Therapy Evaluation Patient Details Name: Billy Romero MRN: [id number] DOB: 10-30-1955 Today's Date: 12/16/2016    History of Present Illness Pt admitted with cough, dizziness, SOB and falls. Pt found to have orthostatic hypotension, dysphagia, dehydration, hypercalcemia. 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   Clinical Impression   Pt reports having supervision for showering and IADL with multiple falls and long hx of dizziness and peripheral neuropathy in UE and LEs. Pt presents with orthostatic hypotension, see flow sheet. He requires min guard assist for OOB activities for safety. Will follow acutely.  Follow Up Recommendations  No OT follow up    Equipment Recommendations  None recommended by OT    Recommendations for Other Services       Precautions / Restrictions Precautions Precautions: Fall Precaution Comments: watch BP Restrictions Weight Bearing Restrictions: No      Mobility Bed Mobility Overal bed mobility: Needs Assistance Bed Mobility: Supine to Sit;Sit to Supine     Supine to sit: Supervision Sit to supine: Supervision   General bed mobility comments: supervision for safety  Transfers Overall transfer level: Needs assistance Equipment used: Rolling walker (2 wheeled) Transfers: Sit to/from Stand Sit to Stand: Min guard         General transfer comment: no physical assist, min guard due to symptomatic hypotension    Balance                                           ADL either performed or assessed with clinical judgement   ADL Overall ADL's : Needs assistance/impaired Eating/Feeding: Independent;Sitting   Grooming: Supervision/safety;Sitting;Wash/dry hands   Upper Body Bathing: Set up;Sitting   Lower Body Bathing: Min guard;Sit to/from stand   Upper Body Dressing : Set up;Sitting   Lower Body Dressing: Min guard;Sit to/from stand    Toilet Transfer: Min guard;Stand-pivot   Toileting- Water quality scientist and Hygiene: Min guard;Sit to/from stand         General ADL Comments: see flow sheet for orthostatics     Vision Patient Visual Report: Blurring of vision Additional Comments: blurred vision, crusty R eye     Perception     Praxis      Pertinent Vitals/Pain Pain Assessment: 0-10 Pain Score: 6  Pain Location: head Pain Descriptors / Indicators: Aching Pain Intervention(s): Monitored during session;Premedicated before session     Hand Dominance Right   Extremity/Trunk Assessment Upper Extremity Assessment Upper Extremity Assessment: History of neuropathy   Lower Extremity Assessment Lower Extremity Assessment: Defer to PT evaluation       Communication Communication Communication: HOH (impaired in R ear)   Cognition Arousal/Alertness: Awake/alert Behavior During Therapy: WFL for tasks assessed/performed Overall Cognitive Status: Within Functional Limits for tasks assessed                                     General Comments       Exercises     Shoulder Instructions      Home Living Family/patient expects to be discharged to:: Private residence Living Arrangements: Spouse/significant other Available Help at Discharge: Family;Available 24 hours/day Type of Home: House Home Access: Stairs to enter [employer] of Steps: 3 Entrance Stairs-Rails: None Home Layout: Two level;Bed/bath upstairs;Full bath on main level Alternate Level  Stairs-Number of Steps: flight Alternate Level Stairs-Rails: Right Bathroom Shower/Tub: Tub/shower unit;Walk-in shower (walk in on second floor)   Bathroom Toilet: Standard     Home Equipment: Shower seat          Prior Functioning/Environment Level of Independence: Needs assistance  Gait / Transfers Assistance Needed: multiple falls ADL's / Homemaking Assistance Needed: wife helps get in and out of shower, assist for  IADL            OT Problem List: Impaired balance (sitting and/or standing);Decreased activity tolerance;Decreased knowledge of use of DME or AE;Pain      OT Treatment/Interventions: Self-care/ADL training;DME and/or AE instruction;Patient/family education;Therapeutic activities    OT Goals(Current goals can be found in the care plan section) Acute Rehab OT Goals Patient Stated Goal: to make it to [facility] for a cancer study Thursday OT Goal Formulation: With patient Time For Goal Achievement: 12/23/16 Potential to Achieve Goals: Good ADL Goals Pt Will Perform Grooming: with supervision;standing (2 activities) Pt Will Perform Lower Body Bathing: with supervision;sit to/from stand Pt Will Perform Lower Body Dressing: with supervision;sit to/from stand Pt Will Transfer to Toilet: with supervision;ambulating;regular height toilet Pt Will Perform Toileting - Clothing Manipulation and hygiene: with supervision;sit to/from stand Pt Will Perform Tub/Shower Transfer: Tub transfer;with supervision;ambulating;shower seat  OT Frequency: Min 2X/week   Barriers to D/C:            Co-evaluation PT/OT/SLP Co-Evaluation/Treatment: Yes Reason for Co-Treatment: For patient/therapist safety   OT goals addressed during session: ADL's and self-care      AM-PAC PT "6 Clicks" Daily Activity     Outcome Measure Help from another person eating meals?: None Help from another person taking care of personal grooming?: A Little Help from another person toileting, which includes using toliet, bedpan, or urinal?: A Little Help from another person bathing (including washing, rinsing, drying)?: A Little Help from another person to put on and taking off regular upper body clothing?: None Help from another person to put on and taking off regular lower body clothing?: A Little 6 Click Score: 20   End of Session Equipment Utilized During Treatment: Gait belt;Rolling walker Nurse Communication:  (aware of  BP)  Activity Tolerance: Treatment limited secondary to medical complications (Comment) (BP to 90/40 with standing) Patient left: in bed;with call bell/phone within reach;with bed alarm set  OT Visit Diagnosis: Unsteadiness on feet (R26.81);Repeated falls (R29.6);Pain                Time: 1012-1039 OT Time Calculation (min): 27 min Charges:  OT General Charges $OT Visit: 1 Procedure OT Evaluation $OT Eval Moderate Complexity: 1 Procedure G-Codes:     Dec 20, 2016 Nestor Lewandowsky, OTR/L Pager: [phone]  Werner Lean, Haze Boyden 12-20-2016, 10:57 AM

## 2016-12-16 NOTE — Evaluation (Signed)
Clinical/Bedside Swallow Evaluation Patient Details  Name: ESTIL VALLEE MRN: [id number] Date of Birth: [birth year]  Today's Date: 12/16/2016 Time: SLP Start Time (ACUTE ONLY): 1450 SLP Stop Time (ACUTE ONLY): 1520 SLP Time Calculation (min) (ACUTE ONLY): 30 min  Past Medical History:  Past Medical History:  Diagnosis Date  . Chronic back pain   . Complication of anesthesia    WOKE UP DURING COLONOSCOPY  . Esophageal cancer ([place]) 03/29/14  . GERD (gastroesophageal reflux disease)   . Pneumonia    May 27, 2016, was + for flu also  . Primary cancer of esophagus with metastasis to other site [facility]) 07/04/2015   Past Surgical History:  Past Surgical History:  Procedure Laterality Date  . APPENDECTOMY    . CERVICAL FUSION    . EYE SURGERY     PTEGERIUM   BIL EYES  . HAND SURGERY     DUPRAGENS CONTRACTURE  RT HAND  . IR GENERIC HISTORICAL  06/11/2016   IR US GUIDE VASC ACCESS RIGHT 06/11/2016 [facility]INTER[facility]  . IR GENERIC HISTORICAL  06/11/2016   IR FLUORO GUIDE PORT INSERTION RIGHT 06/11/2016 [facility]  . KNEE ARTHROSCOPY     LEFT  . PEG PLACEMENT N/A 04/06/2014   Procedure: OPEN JEJUNOSTOMY TUBE PLACEMENT;  Surgeon: Armandina Gemma, MD;  Location: [facility] ORS;  Service: General;  Laterality: N/A;  . VIDEO BRONCHOSCOPY WITH ENDOBRONCHIAL ULTRASOUND N/A 07/04/2015   Procedure: VIDEO BRONCHOSCOPY WITH ENDOBRONCHIAL ULTRASOUND with brushings and biopsies.;  Surgeon: Grace Isaac, MD;  Location: [facility];  Service: Thoracic;  Laterality: N/A;   HPI:  61 yo male adm to [facility] with Pt admitted with cough, dizziness, SOB and falls. Pt found to have orthostatic hypotension, dysphagia, dehydration, hypercalcemia. Pt also have prior ACDF approximately 15 years ago - with post op dysphagia that resolved.  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  Pt had an esophagram 04/2016 showed moderate stricture in  mid=esophagus - ? progression of tumor, residuals or radiation stricture.  Pt denies dysphagia currently and states since treatment his swallow is better.     Assessment / Plan / Recommendation Clinical Impression  Pt with negative cranial nerve exam re: oropharyngeal swallowing and no indication of significant dysphagia with few boluses of po observed *water, crackers.  Voice and cough are strong indicative of intact CN V exam.  Minimal intake observed but pt has great awareness of his prior deficits and able to articulate which foods that are difficult for him.  SLP provided esophageal compensations for pt using teach back.  Pt does admit to occasional issues with large pills sticking in esophagus (pointing distally) causing significant discomfort.  Advised him to ask Md/pharmacist if large pills can be crushed.  Pt denies xerostomia and mouth was moist without evidence of thrush.  Recommend continue diet as tolerated as pt aware of his esophageal dysphagia.  No SLP follow up indicated as oropharyngeal swallow appears intact.  SLP Visit Diagnosis: Dysphagia, unspecified (R13.10)    Aspiration Risk  Mild aspiration risk    Diet Recommendation Regular;Thin liquid   Liquid Administration via: Cup;Straw Medication Administration: Whole meds with liquid Supervision: Patient able to self feed Compensations: Slow rate;Small sips/bites;Follow solids with liquid Postural Changes: Seated upright at 90 degrees;Remain upright for at least 30 minutes after po intake    Other  Recommendations Oral Care Recommendations: Oral care BID   Follow up Recommendations   n/a  Frequency and Duration     n/a       Prognosis   n/a     Swallow Study   General Date of Onset: 12/16/16 HPI: 61 yo male adm to [facility] with Pt admitted with cough, dizziness, SOB and falls. Pt found to have orthostatic hypotension, dysphagia, dehydration, hypercalcemia. Pt also have prior ACDF approximately 15 years ago - with post  op dysphagia that resolved.  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  Pt had an esophagram 04/2016 showed moderate stricture in mid=esophagus - ? progression of tumor, residuals or radiation stricture.  Pt denies dysphagia currently and states since treatment his swallow is better.   Type of Study: Bedside Swallow Evaluation Diet Prior to this Study: Regular;Thin liquids Temperature Spikes Noted: No Respiratory Status: Room air History of Recent Intubation: No Behavior/Cognition: Alert;Cooperative;Pleasant mood Oral Cavity Assessment: Within Functional Limits Oral Care Completed by SLP: No Oral Cavity - Dentition: Adequate natural dentition Vision: Functional for self-feeding Self-Feeding Abilities: Able to feed self Patient Positioning: Upright in bed Baseline Vocal Quality: Normal Volitional Cough: Strong Volitional Swallow: Able to elicit    Oral/Motor/Sensory Function Overall Oral Motor/Sensory Function: Within functional limits   Ice Chips Ice chips: Not tested   Thin Liquid Thin Liquid: Within functional limits Pharyngeal  Phase Impairments: Cough - Delayed    Nectar Thick Nectar Thick Liquid: Not tested   Honey Thick Honey Thick Liquid: Not tested   Puree Puree: Not tested   Solid   GO   Solid: Within functional limits Presentation: Self Fed        Macario Golds 12/16/2016,3:37 PM  Luanna Salk, [facility] SLP [phone]

## 2016-12-16 NOTE — Evaluation (Signed)
Physical Therapy Evaluation Patient Details Name: Billy Romero MRN: [id number] DOB: 1955-10-24 Today's Date: 12/16/2016   History of Present Illness  Pt admitted with cough, dizziness, SOB and falls. Pt found to have orthostatic hypotension, dysphagia, dehydration, hypercalcemia. 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  Clinical Impression  The patient has  orthostatic  BP today. RN aware, values placed in doc flow sheets. The patient reports multiple falls over past 2 months.  Pt admitted with above diagnosis. Pt currently with functional limitations due to the deficits listed below (see PT Problem List).  Pt will benefit from skilled PT to increase their independence and safety with mobility to allow discharge to the venue listed below.  Patient reports blurred vision right eye, noted redness and swelling.       Follow Up Recommendations Home health PT;Supervision/Assistance - 24 hour (if BP normalizes when upright,may not need)    Equipment Recommendations    RW.   Recommendations for Other Services       Precautions / Restrictions Precautions Precautions: Fall Precaution Comments: watch BP Restrictions Weight Bearing Restrictions: No      Mobility  Bed Mobility Overal bed mobility: Needs Assistance Bed Mobility: Supine to Sit;Sit to Supine     Supine to sit: Supervision Sit to supine: Supervision   General bed mobility comments: supervision for safety  Transfers Overall transfer level: Needs assistance Equipment used: Rolling walker (2 wheeled) Transfers: Sit to/from Stand Sit to Stand: Min guard         General transfer comment: no physical assist, min guard due to symptomatic hypotension  Ambulation/Gait             General Gait Details: unable  Stairs            Wheelchair Mobility    Modified Rankin (Stroke Patients Only)       Balance                                              Pertinent Vitals/Pain Pain Assessment: 0-10 Pain Score: 6  Pain Location: head Pain Descriptors / Indicators: Aching Pain Intervention(s): Monitored during session;Premedicated before session    Home Living Family/patient expects to be discharged to:: Private residence Living Arrangements: Spouse/significant other Available Help at Discharge: Family;Available 24 hours/day Type of Home: House Home Access: Stairs to enter Entrance Stairs-Rails: None Entrance Stairs-Number of Steps: 3 Home Layout: Two level;Bed/bath upstairs;Full bath on main level Home Equipment: Shower seat      Prior Function Level of Independence: Needs assistance   Gait / Transfers Assistance Needed: multiple falls  ADL's / Homemaking Assistance Needed: wife helps get in and out of shower, assist for IADL        Hand Dominance   Dominant Hand: Right    Extremity/Trunk Assessment   Upper Extremity Assessment Upper Extremity Assessment: Defer to OT evaluation;RUE deficits/detail;LUE deficits/detail RUE Sensation: history of peripheral neuropathy LUE Sensation: history of peripheral neuropathy    Lower Extremity Assessment Lower Extremity Assessment: RLE deficits/detail;LLE deficits/detail RLE Sensation: history of peripheral neuropathy LLE Sensation: history of peripheral neuropathy    Cervical / Trunk Assessment Cervical / Trunk Assessment: Normal  Communication   Communication: HOH (impaired in R ear)  Cognition Arousal/Alertness: Awake/alert Behavior During Therapy: WFL for tasks assessed/performed Overall Cognitive Status: Within Functional Limits for tasks  assessed                                        General Comments      Exercises     Assessment/Plan    PT Assessment Patient needs continued PT services  PT Problem List Decreased strength;Decreased range of motion;Decreased activity tolerance;Decreased balance;Decreased  mobility;Decreased knowledge of precautions;Decreased safety awareness;Decreased knowledge of use of DME       PT Treatment Interventions      PT Goals (Current goals can be found in the Care Plan section)  Acute Rehab PT Goals Patient Stated Goal: to make it to [facility] for a cancer study Thursday PT Goal Formulation: With patient Time For Goal Achievement: 12/30/16 Potential to Achieve Goals: Good    Frequency     Barriers to discharge        Co-evaluation PT/OT/SLP Co-Evaluation/Treatment: Yes Reason for Co-Treatment: For patient/therapist safety PT goals addressed during session: Mobility/safety with mobility OT goals addressed during session: ADL's and self-care       AM-PAC PT "6 Clicks" Daily Activity  Outcome Measure Difficulty turning over in bed (including adjusting bedclothes, sheets and blankets)?: None Difficulty moving from lying on back to sitting on the side of the bed? : None Difficulty sitting down on and standing up from a chair with arms (e.g., wheelchair, bedside commode, etc,.)?: A Little Help needed moving to and from a bed to chair (including a wheelchair)?: A Lot Help needed walking in hospital room?: A Lot Help needed climbing 3-5 steps with a railing? : A Lot 6 Click Score: 17    End of Session Equipment Utilized During Treatment: Gait belt Activity Tolerance: Treatment limited secondary to medical complications (Comment) Patient left: in bed;with call bell/phone within reach;with bed alarm set Nurse Communication: Mobility status PT Visit Diagnosis: Difficulty in walking, not elsewhere classified (R26.2)    Time: 1013-1040 PT Time Calculation (min) (ACUTE ONLY): 27 min   Charges:   PT Evaluation $PT Eval Low Complexity: 1 Low     PT G CodesTresa Endo PT [phone]   Claretha Cooper 12/16/2016, 11:09 AM

## 2016-12-16 NOTE — Progress Notes (Signed)
[facility] Progress Note  Patient: Billy Romero FYB:[id number]   PCP: Lujean Amel, MD DOB: November 05, 1955   DOA: 12/15/2016   DOS: 12/16/2016   Date of Service: the patient was seen and examined on 12/16/2016  Subjective: Continues to have symptoms of orthostasis. Also complains about right eye vision changes and right thigh pain worsening. No nausea no vomiting. No diarrhea no constipation. No chest pain or abdominal pain. Has some cough with occasional blood streaked mucus sputum.  Brief hospital course: Pt. with PMH of metastatic esophageal cancer, PE, on Xarelto, recurrent syncope, GERD; admitted on 12/15/2016, presented with complaint of passing out, was found to have orthostatic hypotension. Currently further plan is further workup of syncope.  Assessment and Plan: 1. Recurrent syncope Patient has recurrent syncope ongoing for last many years with significantly positive orthostatic. Multiple admissions for the same in the past. Echocardiogram unremarkable. No reoccurrence here in the hospital but remains dizzy when he changes his position and starts blacking out. Orthostatics are positive. Patient drinks significant amount of iced tea on a daily basis likely leading to dehydration. Currently continue IV hydration, compression stockings, and Florinef. Transfer to telemetry may require outpatient 30 day monitor. Check EEG  2. Adrenal insufficiency Low cortisol levels checked in March 2018. No Cosyntropin stimulation test done in the past but the patient has been on hydrocortisone 20 mg twice a day since last few months. At present we'll add 0.05 mg Florinef and monitor the response. Would require endocrinology as an outpatient.  3 hypercalcemia Due to bony metastasis and malignancy. Continue aggressive IV hydration with normal saline. Given one dose of Zometa. Not overtly symptomatic, we'll continue to monitor daily calcium levels.  4. Metastatic esophageal cancer follows  up with Dr. Benay Spice, has an appointment with Dr. Fanny Skates to get enrolled in a clinical trial. Speech therapy consulted for dysphagia. 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 CT scan of the chest shows left upper lobe pulmonary artery encasement from the tumor as well as multiple pulmonary nodular lesion and multilevel adenopathy. unchanged as compared to prior. CT head shows extensive involvement of the calvarium, expansile lesion involving the right frontal bone. Has metastasis involving skull base. Also has mass affect on the anterior right frontal lobe as well as temporal lobes. No midline shift. No acute infarct. Has worsening headache due to this lesions. We will currently provide pain control, oncology currently following for follow-up on recommendation.  5. Hypokalemia. Replaced and will recheck tomorrow.  Diet: Regular diet  DVT Prophylaxis: on therapeutic anticoagulation.  Advance goals of care discussion: full code  Family Communication: family was present at bedside, at the time of interview. The pt provided permission to discuss medical plan with the family. Opportunity was given to ask question and all questions were answered satisfactorily.   Disposition:  Discharge to home.  Consultants: oncology Procedures: none  Antibiotics: Anti-infectives    None       Objective: Physical Exam: Vitals:   12/15/16 1640 12/15/16 2320 12/16/16 0529 12/16/16 1403  BP:  126/77 127/76 110/75  Pulse: (!) 110 98 88 (!) 103  Resp: 16 16 16 18   Temp: 98.5 F (36.9 C) 98.8 F (37.1 C) 97.9 F (36.6 C) 98.2 F (36.8 C)  TempSrc: Oral Oral Oral Oral  SpO2: 98% 98% 96% 98%  Weight: 84.2 kg (185 lb 9.6 oz)     Height: 5\' 11"  (1.803 m)       Intake/Output Summary (Last 24 hours) at 12/16/16 1522 Last data filed at  12/16/16 1403  Gross per 24 hour  Intake              490 ml  Output             1200 ml  Net             -710 ml   Filed Weights   12/15/16 1247 12/15/16 1640  Weight:  85.7 kg (189 lb) 84.2 kg (185 lb 9.6 oz)   General: Alert, Awake and Oriented to Time, Place and Person. Appear in moderate distress, affect appropriate Eyes: PERRL, Conjunctiva normal ENT: Oral Mucosa clear moist. Neck: difficult to assess JVD, no Abnormal Mass Or lumps Cardiovascular: S1 and S2 Present, no Murmur, Peripheral Pulses Present Respiratory: normal respiratory effort, Bilateral Air entry equal and Decreased, no use of accessory muscle, Clear to Auscultation, no Crackles, no wheezes Abdomen: Bowel Sound present, Soft and no tenderness, no hernia Skin: no redness, no Rash, no induration Extremities: no Pedal edema, no calf tenderness Neurologic: Grossly no focal neuro deficit. Bilaterally Equal motor strength  Data Reviewed: CBC:  Recent Labs Lab 12/15/16 1321 12/16/16 0500  WBC 9.1 8.0  HGB 12.6* 11.9*  HCT 37.3* 35.7*  MCV 91.6 90.6  PLT 205 828   Basic Metabolic Panel:  Recent Labs Lab 12/15/16 1321 12/16/16 0500  NA 141 142  K 3.3* 3.6  CL 103 106  CO2 27 26  GLUCOSE 116* 124*  BUN 13 12  CREATININE 1.17 1.14  CALCIUM 12.5* 12.0*    Liver Function Tests:  Recent Labs Lab 12/16/16 0500  AST 21  21  ALT 14*  14*  ALKPHOS 119  119  BILITOT 0.7  0.7  PROT 6.2*  6.2*  ALBUMIN 3.1*  3.1*   No results for input(s): LIPASE, AMYLASE in the last 168 hours. No results for input(s): AMMONIA in the last 168 hours. Coagulation Profile: No results for input(s): INR, PROTIME in the last 168 hours. Cardiac Enzymes: No results for input(s): CKTOTAL, CKMB, CKMBINDEX, TROPONINI in the last 168 hours. BNP (last 3 results) No results for input(s): PROBNP in the last 8760 hours. CBG:  Recent Labs Lab 12/15/16 1251  GLUCAP 122*   Studies: No results found.  Scheduled Meds: . baclofen  10 mg Oral QHS  . fludrocortisone  0.05 mg Oral Daily  . hydrocortisone  20 mg Oral BID  . pantoprazole  40 mg Oral Daily  . rivaroxaban  20 mg Oral Q supper    Continuous Infusions: . sodium chloride 100 mL/hr at 12/16/16 0411   PRN Meds: acetaminophen, HYDROcodone-acetaminophen, morphine injection, ondansetron (ZOFRAN) IV, promethazine  Time spent: 35 minutes  Author: Berle Mull, MD [facility] Pager: [phone] 12/16/2016 3:22 PM  If 7PM-7AM, please contact night-coverage at [email or web address], password [facility]

## 2016-12-16 NOTE — Progress Notes (Signed)
MD order for cardiac monitoring placed.  Bed requested and report called to receiving RN. Patient to transfer to [id number] once room is cleaned.

## 2016-12-16 NOTE — Progress Notes (Signed)
Received patient from 3W, VS obtained, telemetry applied, oriented to unit, call light placed in reach

## 2016-12-16 NOTE — Progress Notes (Signed)
IP PROGRESS NOTE  Subjective:   Billy Romero was admitted yesterday with syncope. He reports several episodes of syncope recently. He reports adequate fluid intake. He has increased dyspnea. No fever. He was noted to be orthostatic in the emergency room yesterday. The calcium was elevated. CTs of the brain and chest showed no acute change.  Objective: Vital signs in last 24 hours: Blood pressure 127/76, pulse 88, temperature 97.9 F (36.6 C), temperature source Oral, resp. rate 16, height _0  (1.803 m), weight 185 lb 9.6 oz (84.2 kg), SpO2 96 %.  Intake/Output from previous day: 08/19 0701 - 08/20 0700 In: 250 [P.O.:240; I.V.:10] Out: 850 [Urine:850]  Physical Exam:  HEENT: No thrush Lungs: Clear bilaterally Cardiac: Regular rate and rhythm Abdomen: No hepatomegaly Extremities: No leg edema Neurologic: Alert and oriented, follows commands, diplopia  Portacath/PICC-without erythema  Lab Results:  Recent Labs  12/15/16 1321 12/16/16 0500  WBC 9.1 8.0  HGB 12.6* 11.9*  HCT 37.3* 35.7*  PLT 205 201    BMET  Recent Labs  12/15/16 1321 12/16/16 0500  NA 141 142  K 3.3* 3.6  CL 103 106  CO2 27 26  GLUCOSE 116* 124*  BUN 13 12  CREATININE 1.17 1.14  CALCIUM 12.5* 12.0*     Studies/Results: Ct Head Wo Contrast  Result Date: 12/15/2016 CLINICAL DATA:  Esophageal carcinoma with metastases. Several recent falls. EXAM: CT HEAD WITHOUT CONTRAST CT CERVICAL SPINE WITHOUT CONTRAST TECHNIQUE: Multidetector CT imaging of the head and cervical spine was performed following the standard protocol without intravenous contrast. Multiplanar CT image reconstructions of the cervical spine were also generated. COMPARISON:  Head CT Sep 09, 2016 FINDINGS: CT HEAD FINDINGS Brain: There is mild diffuse atrophy. There is no intra-axial mass or hemorrhage. There are no appreciable subdural or epidural fluid collections. No midline shift. There is slight small vessel disease in the  centra semiovale bilaterally. No acute infarct evident. Vascular: No hyperdense vessel. No appreciable vascular calcification. Skull: There is extensive bony metastatic disease throughout the calvarium. There is destruction of the superior midline and adjacent parietal bones with extensive periosteal re-expansion in this area. There is an expansile lytic lesion involving the right frontal bone with localized impression on the right superior frontal lobe due to this extensive bony expansion. There is extensive lytic change throughout the temporal bones bilaterally, more severe on the right than on the left, with periosteal expansion on the right causing modest mass effect on the posterior frontal and temporal lobes on the right due to periosteal expansion. Tumor is noted throughout the sphenoid bones, more severe on the left than on the right. Sinuses/Orbits: There is mild mucosal thickening in the anterior right maxillary antrum. There is mild mucosal thickening in several ethmoid air cells. No intraorbital lesions are evident. Other: There is opacification in the mastoid air cells bilaterally, more severe on the right than on the left. CT CERVICAL SPINE FINDINGS Alignment: There is no evidence spondylolisthesis. Skull base and vertebrae: There are lytic metastases involving the skullbase and posterior colitis without mass effect on the posterior fossa or midbrain regions. Craniocervical junction region appears normal. There are no appreciable blastic or lytic lesions in the cervical spine. No fracture evident. There is anterior screw and plate fixation from L2-X5 with support hardware intact. Soft tissues and spinal canal: Prevertebral soft tissues and predental space regions are normal. There are no paraspinous lesions. No cord or canal hematoma evident. Disc levels: There are disc spacers at C5-6 and C6-7.  There is moderately severe disc space narrowing at C7-T1. There is a prominent anterior osteophyte at C4.  There is multilevel facet osteoarthritic change. There is exit foraminal narrowing due to bony hypertrophy at C3-4 bilaterally, with impression on exiting nerve roots bilaterally at this level. There is no frank disc extrusion or high-grade stenosis. Upper chest: No consolidation in the visualized upper lung zones. Air is noted in the upper cervical esophagus. Other: There is calcification in each carotid artery. IMPRESSION: CT head: Extensive lytic bony metastases throughout the bony calvarium and skullbase. Areas of expansion into the periosteum cause mass effect on the anterior right frontal lobe and to a lesser extent on the right posterior frontal and right temporal lobes. No midline shift. No intra-axial lesion. No subdural or epidural fluid collections are evident. No acute infarct evident. There is mastoid disease bilaterally, more severe on the right than on the left. CT cervical spine: No fracture or spondylolisthesis. Postoperative change from C5-C7 anteriorly. Multilevel osteoarthritic change with exit foraminal narrowing greatest at C3-4 bilaterally. There are foci of carotid artery calcification bilaterally. Electronically Signed   By: Lowella Grip III M.D.   On: 12/15/2016 15:41   Ct Angio Chest Pe W/cm &/or Wo Cm  Result Date: 12/15/2016 CLINICAL DATA:  Shortness of breath. Metastatic esophageal carcinoma EXAM: CT ANGIOGRAPHY CHEST WITH CONTRAST TECHNIQUE: Multidetector CT imaging of the chest was performed using the standard protocol during bolus administration of intravenous contrast. Multiplanar CT image reconstructions and MIPs were obtained to evaluate the vascular anatomy. CONTRAST:  100 mL Isovue 370 nonionic COMPARISON:  Chest CT November 27, 2016 FINDINGS: Cardiovascular: There is no appreciable pulmonary embolus. Note that adenopathy in pieces several pulmonary arteries. There is marked narrowing of the left upper lobe pulmonary artery due to encasement from tumor. There is no  thoracic aortic aneurysm or dissection. The visualized great vessels appear normal. There is a fairly small pericardial effusion present. There is slight calcification in the thoracic aorta. Mediastinum/Nodes: Thyroid is diminutive but unremarkable in appearance. There is extensive adenopathy. In the aortopulmonary window region, there are multiple confluent lymph nodes with the confluence of adenopathy in this area measuring 3.1 x 3.2 cm. There is an adjacent lymph node to the left of the aortic arch measuring 2.8 x 1.8 cm. There is adenopathy in the right paratracheal region measuring 1.9 x 1.2 cm. There is adenopathy throughout the left hilar region with the largest confluence of adenopathy in this area measuring 2.5 x 1.8 cm. There is extensive adenopathy in the right hilar region with the largest confluence of adenopathy in this region measuring 3.3 x 2.7 cm. There is sub- carinal adenopathy the with the largest individual lymph node measuring 1.7 x 1.5 cm. There is a mass in the mid esophagus with marked wall thickening in this area, unchanged from prior study. There is air in the esophagus superior to the mid esophageal mass. There is esophageal wall thickening throughout the distal half of the esophagus. Lungs/Pleura: There is nodular interstitial thickening throughout much of the right lower lobe, likely due to lymphangitic spread of tumor. There is focal airspace consolidation in the medial aspect of the superior segment of the right lower lobe measuring 2.5 x 2.3 cm. There are smaller nodular opacities elsewhere in the right lower lobe. There is a peripheral nodular opacity abutting the pleura in the posterior segment of the left upper lobe measuring 8 x 8 mm. There is a nodular opacity in the posterior segment of the left  upper lobe more inferiorly measuring 1.0 x 0.5 cm. Several small nodular opacities are noted in the left upper lobe more anteriorly, all felt to represent metastatic foci. There is no  appreciable pleural effusion. Upper Abdomen: There is incomplete visualization of a lymph node anterior to the medial left diaphragmatic crus measuring 1.6 x 1.2 cm. Visualized upper abdominal structures otherwise appear unremarkable. Musculoskeletal: There is multilevel degenerative change. There is postoperative change in the lower cervical spine region. No blastic or lytic bone lesions are evident. Review of the MIP images confirms the above findings. IMPRESSION: 1. No demonstrable pulmonary embolus. Note that tumor encases proximal pulmonary arteries with the greatest degree of encasement and narrowing involving the left upper lobe pulmonary artery. 2. Mass midesophagus with thickening of the distal half of the esophageal wall. Appearance unchanged compared to recent study. 3. Multiple pulmonary nodular lesions with the greatest confluent nodularity in the right lower lobe. Suspect lymphangitic spread of tumor in the right lower lobe as well. 4. Multilevel adenopathy, similar to recent study, consistent with extensive metastatic disease. 5.  Mild aortic atherosclerosis. 6.  Fairly small pericardial effusion. Aortic Atherosclerosis (ICD10-I70.0). Electronically Signed   By: Lowella Grip III M.D.   On: 12/15/2016 15:32   Ct Cervical Spine Wo Contrast  Result Date: 12/15/2016 CLINICAL DATA:  Esophageal carcinoma with metastases. Several recent falls. EXAM: CT HEAD WITHOUT CONTRAST CT CERVICAL SPINE WITHOUT CONTRAST TECHNIQUE: Multidetector CT imaging of the head and cervical spine was performed following the standard protocol without intravenous contrast. Multiplanar CT image reconstructions of the cervical spine were also generated. COMPARISON:  Head CT Sep 09, 2016 FINDINGS: CT HEAD FINDINGS Brain: There is mild diffuse atrophy. There is no intra-axial mass or hemorrhage. There are no appreciable subdural or epidural fluid collections. No midline shift. There is slight small vessel disease in the centra  semiovale bilaterally. No acute infarct evident. Vascular: No hyperdense vessel. No appreciable vascular calcification. Skull: There is extensive bony metastatic disease throughout the calvarium. There is destruction of the superior midline and adjacent parietal bones with extensive periosteal re-expansion in this area. There is an expansile lytic lesion involving the right frontal bone with localized impression on the right superior frontal lobe due to this extensive bony expansion. There is extensive lytic change throughout the temporal bones bilaterally, more severe on the right than on the left, with periosteal expansion on the right causing modest mass effect on the posterior frontal and temporal lobes on the right due to periosteal expansion. Tumor is noted throughout the sphenoid bones, more severe on the left than on the right. Sinuses/Orbits: There is mild mucosal thickening in the anterior right maxillary antrum. There is mild mucosal thickening in several ethmoid air cells. No intraorbital lesions are evident. Other: There is opacification in the mastoid air cells bilaterally, more severe on the right than on the left. CT CERVICAL SPINE FINDINGS Alignment: There is no evidence spondylolisthesis. Skull base and vertebrae: There are lytic metastases involving the skullbase and posterior colitis without mass effect on the posterior fossa or midbrain regions. Craniocervical junction region appears normal. There are no appreciable blastic or lytic lesions in the cervical spine. No fracture evident. There is anterior screw and plate fixation from T0-Z6 with support hardware intact. Soft tissues and spinal canal: Prevertebral soft tissues and predental space regions are normal. There are no paraspinous lesions. No cord or canal hematoma evident. Disc levels: There are disc spacers at C5-6 and C6-7. There is moderately severe disc space  narrowing at C7-T1. There is a prominent anterior osteophyte at C4. There is  multilevel facet osteoarthritic change. There is exit foraminal narrowing due to bony hypertrophy at C3-4 bilaterally, with impression on exiting nerve roots bilaterally at this level. There is no frank disc extrusion or high-grade stenosis. Upper chest: No consolidation in the visualized upper lung zones. Air is noted in the upper cervical esophagus. Other: There is calcification in each carotid artery. IMPRESSION: CT head: Extensive lytic bony metastases throughout the bony calvarium and skullbase. Areas of expansion into the periosteum cause mass effect on the anterior right frontal lobe and to a lesser extent on the right posterior frontal and right temporal lobes. No midline shift. No intra-axial lesion. No subdural or epidural fluid collections are evident. No acute infarct evident. There is mastoid disease bilaterally, more severe on the right than on the left. CT cervical spine: No fracture or spondylolisthesis. Postoperative change from C5-C7 anteriorly. Multilevel osteoarthritic change with exit foraminal narrowing greatest at C3-4 bilaterally. There are foci of carotid artery calcification bilaterally. Electronically Signed   By: Lowella Grip III M.D.   On: 12/15/2016 15:41    Medications: I have reviewed the patient's current medications.  Assessment/Plan: 1. 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 Radiation completed 06/10/2014  PET scan 07/01/2014 revealed improvement in the hypermetabolic esophagus mass and mediastinal lymphadenopathy  upper endoscopy 04/20/2015 -mid esophagus with mild narrowing , 2 small nodules measuring approximate 5 mm -biopsy with scant  and detached fragments with high-grade squamous dysplasia , brushings negative for malignant cells.  CT chest/abdomen/pelvis 05/25/2015 with development of bilateral pulmonary nodules; paratracheal and subcarinal nodes improved/similar; new small lower paraesophageal nodes which are indeterminate; esophageal wall thickening improved since the prior PET; no evidence of metastatic disease in the abdomen or pelvis.  PET scan 06/16/2015 revealed hypermetabolic lung nodules, hypermetabolic mediastinal nodes, hypermetabolism at the mid to distal esophagus has improved  bronchoscopy/EBUS on 07/04/2015 confirmed squamous cell carcinoma involving right lower lobe brushings, right bronchial washings, a level 10 R lymph node biopsy, and right lower lobe biopsy  PD-L1 no expression on the right lower lobe biopsy (less than 1%)  Cycle 1 Pembrolizumab 07/26/2015  Cycle 2 Pembrolizumab 08/16/2015  Cycle 3 Pembrolizumab 09/06/2015  Cycle 4 Pembrolizumab 09/27/2015  CT chest 10/11/2015-stable/decreased size of pulmonary nodules, enlargement of several mediastinal nodes  Cycle 5 Pembrolizumab 10/18/2015  Cycle 6 Pembrolizumab 11/08/2015   Cycle 7 Pembrolizumab08/05/2015  Cycle 8 Pembrolizumab08/23/2017  CT chest 01/15/2016-slight progression of mediastinal/upper abdominal lymphadenopathy and enlargement of a right lower lobe nodule  Cycle 9 Pembrolizumab 01/17/2016   Cycle 10 Pembrolizumab10/02/2016  Cycle 11 Pembrolizumab 02/28/2016  Cycle 12 Pembrolizumab 03/20/2016  CT chest 04/25/2016-minimal progression of chest lymphadenopathy and lung nodules, thickening at the mid thoracic esophagus  MRI brain 04/30/2016-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 with involvement of the right orbit, no brain metastases  Palliative radiation right sphenoid lesion beginning 05/02/2016  CT chest 06/13/2016-left lower lobe pulmonary embolus, increased mediastinal and hilar  adenopathy, septal thickening and clustered nodules in the right lower lobe, gastrohepatic lymphadenopathy  Cycle 1 FOLFOX 06/24/2016  Cycle 2 FOLFOX 07/15/2016  Cycle 3 FOLFOX 07/29/2016  Cycle 4 FOLFOX 08/12/2016  Cycle 5 FOLFOX 08/27/2016  Restaging CTs  of the head and chest on 09/09/2016-decreased hilar/mediastinal lymphadenopathy, decreased lung nodules, stable esophageal thickening. Decreased right orbital mass, progressive calvarial metastases compared to an MRI from 04/30/2016  Cycle 6 FOLFOX 09/10/2016  Cycle 7 FOLFOX 09/25/2016  Xeloda initiated 7 days on/7 days off 11/06/2016  Chest CT 11/27/2016-persistent midesophagus thickening, increased chest lymphadenopathy and multifocal nodularity/septal thickening bilaterally, new upper abdominal adenopathy  2. Solid/liquid dysphagia secondary to #1  Jejunostomy feeding tube placement 04/07/2014  Feeding tube removed 05/30/2014.  3. Small bowel obstruction following placement of the jejunostomy feeding tube-resolved  4. History of ulcerative colitis  5. Aspiration pneumonia December 2015  6. 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 Fever/cough/dyspnea 04/30/2016-nasal swab positive for influenza A , chest x-ray with a left lower lobe Pneumonia -treated with Tamiflu and Levaquin   8. Admission 06/13/2016 with a cough/left-sided chest pain/dyspnea/tachycardia/low-grade fever-pulmonary embolism confirmed on a chest CT, progressive chest adenopathy, inflammatory changes versus lymphatic tumor spread in the right lower lobe.Maintained on Xarelto.  9. Port-A-Cath placement 06/11/2016  10. Dysphagia. Barium swallow 05/01/2016 with circumferential narrowing of the midesophagus causing moderate degree of stricture which would not allow the 13 mm barium tablet to pass. The dysphagia improved spontaneously. He  developed recurrent dysphagia to solids 06/15/2016. He is tolerating liquids.  11. Recurrent syncope events-borderline low cortisol 07/08/2016, symptoms improved with hydrocortisone replacement  12. 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 He was admitted yesterday with syncope and orthostatic hypotension. The orthostasis may be related to dehydration in the setting of hypercalcemia, there could also be a component related to autonomic neuropathy. Orthostasis in the past was felt to be related to adrenal insufficiency.  He has hypercalcemia of malignancy in the setting of bone metastases.  Billy Romero is scheduled for an evaluation by Dr. Fanny Skates at [facility] later this week to consider enrollment on a clinical trial there.  Recommendations: 1. Continue intravenous hydration, and biphosphonate therapy for the hypercalcemia 2. Repeat orthostatic vital signs after correction of dehydration/hypercalcemia, consider adding trial of Florinef 3. Plan to keep appointment at [facility] for a 23 2018  I will continue following Billy Romero he is in the hospital.     LOS: 1 day   Donneta Romberg, MD   12/16/2016, 9:30 AM

## 2016-12-17 ENCOUNTER — Inpatient Hospital Stay (HOSPITAL_COMMUNITY)
Admission: EM | Admit: 2016-12-17 | Discharge: 2016-12-17 | Disposition: A | Discharge status: Home / Self Care | Payer: BLUE CROSS/BLUE SHIELD | Source: Home / Self Care | Attending: Internal Medicine | Admitting: Internal Medicine

## 2016-12-17 ENCOUNTER — Ambulatory Visit: Payer: BLUE CROSS/BLUE SHIELD | Admitting: Oncology

## 2016-12-17 DIAGNOSIS — R55 Syncope and collapse: Secondary | ICD-10-CM

## 2016-12-17 LAB — CBC
HCT: 32.9 % — ABNORMAL LOW (ref 39.0–52.0)
HEMOGLOBIN: 11.1 g/dL — AB (ref 13.0–17.0)
MCH: 30.8 pg (ref 26.0–34.0)
MCHC: 33.7 g/dL (ref 30.0–36.0)
MCV: 91.4 fL (ref 78.0–100.0)
Platelets: 176 10*3/uL (ref 150–400)
RBC: 3.6 MIL/uL — AB (ref 4.22–5.81)
RDW: 14.4 % (ref 11.5–15.5)
WBC: 6.7 10*3/uL (ref 4.0–10.5)

## 2016-12-17 LAB — COMPREHENSIVE METABOLIC PANEL
ALK PHOS: 100 U/L (ref 38–126)
ALT: 12 U/L — AB (ref 17–63)
AST: 17 U/L (ref 15–41)
Albumin: 2.6 g/dL — ABNORMAL LOW (ref 3.5–5.0)
Anion gap: 6 (ref 5–15)
BUN: 9 mg/dL (ref 6–20)
CALCIUM: 9.9 mg/dL (ref 8.9–10.3)
CO2: 28 mmol/L (ref 22–32)
CREATININE: 0.9 mg/dL (ref 0.61–1.24)
Chloride: 106 mmol/L (ref 101–111)
GFR calc non Af Amer: >60 mL/min (ref >60)
Glucose, Bld: 126 mg/dL — ABNORMAL HIGH (ref 65–99)
Potassium: 3 mmol/L — ABNORMAL LOW (ref 3.5–5.1)
SODIUM: 140 mmol/L (ref 135–145)
Total Bilirubin: 0.6 mg/dL (ref 0.3–1.2)
Total Protein: 5.4 g/dL — ABNORMAL LOW (ref 6.5–8.1)

## 2016-12-17 MED ORDER — HYDROCODONE-HOMATROPINE 5-1.5 MG/5ML PO SYRP: 5.0000 mL | ORAL_SOLUTION | ORAL | Status: DC | PRN

## 2016-12-17 MED ORDER — MIDODRINE HCL 5 MG PO TABS
5.0000 mg | ORAL_TABLET | Freq: Three times a day (TID) | ORAL | Status: DC
  Administered 2016-12-17 – 2016-12-18 (×4): 5 mg via ORAL
  Filled 2016-12-17 (×5): qty 1

## 2016-12-17 MED ORDER — MENTHOL 3 MG MT LOZG
1.0000 | LOZENGE | OROMUCOSAL | Status: DC | PRN
  Filled 2016-12-17: qty 9

## 2016-12-17 MED ORDER — BENZONATATE 100 MG PO CAPS
100.0000 mg | ORAL_CAPSULE | Freq: Three times a day (TID) | ORAL | Status: DC
  Administered 2016-12-17 – 2016-12-18 (×4): 100 mg via ORAL
  Filled 2016-12-17 (×4): qty 1

## 2016-12-17 MED ORDER — POTASSIUM CHLORIDE CRYS ER 20 MEQ PO TBCR
40.0000 meq | EXTENDED_RELEASE_TABLET | Freq: Once | ORAL | Status: AC
  Administered 2016-12-17: 40 meq via ORAL
  Filled 2016-12-17: qty 2

## 2016-12-17 MED ORDER — HYDROCOD POLST-CPM POLST ER 10-8 MG/5ML PO SUER
5.0000 mL | Freq: Two times a day (BID) | ORAL | Status: DC
  Administered 2016-12-17 – 2016-12-18 (×3): 5 mL via ORAL
  Filled 2016-12-17 (×3): qty 5

## 2016-12-17 MED ORDER — SODIUM CHLORIDE 0.9% FLUSH: 10.0000 mL | INTRAVENOUS | Status: DC | PRN

## 2016-12-17 NOTE — Progress Notes (Signed)
[place] list given to pt.

## 2016-12-17 NOTE — Procedures (Signed)
ELECTROENCEPHALOGRAM REPORT  Date of Study: 12/17/2016  Patient's Name: Billy Romero MRN: [id number] Date of Birth: 1955/10/20  Referring Provider: Dr. Berle Mull  Clinical History: This is a 61 year old man with syncope  Medications: acetaminophen (TYLENOL) tablet 650 mg  baclofen (LIORESAL) tablet 10 mg  benzonatate (TESSALON) capsule 100 mg  chlorpheniramine-HYDROcodone (TUSSIONEX) 10-8 MG/5ML suspension 5 mL  fludrocortisone (FLORINEF) tablet 0.05 mg  HYDROcodone-acetaminophen (NORCO/VICODIN) 5-325 MG per tablet 1-2 tablet  HYDROcodone-homatropine (HYCODAN) 5-1.5 MG/5ML syrup 5 mL  hydrocortisone (CORTEF) tablet 20 mg  menthol-cetylpyridinium (CEPACOL) lozenge 3 mg  midodrine (PROAMATINE) tablet 5 mg  morphine 2 MG/ML injection 2 mg  ondansetron (ZOFRAN) injection 4 mg  pantoprazole (PROTONIX) EC tablet 40 mg  promethazine (PHENERGAN) tablet 25 mg  rivaroxaban (XARELTO) tablet 20 mg   Technical Summary: A multichannel digital EEG recording measured by the international 10-20 system with electrodes applied with paste and impedances below 5000 ohms performed as portable with EKG monitoring in a predominantly drowsy and asleep patient.  Hyperventilation was not performed. Photic stimulation was performed.  The digital EEG was referentially recorded, reformatted, and digitally filtered in a variety of bipolar and referential montages for optimal display.   Description: The patient is predominantly drowsy and asleep during the recording.  During brief period of wakefulness, there is a symmetric, medium voltage 6 Hz posterior dominant rhythm that poorly attenuates with eye opening and eye closure. This is admixed with a moderate amount of diffuse 4-5 Hz theta and 2-3 Hz delta slowing of the background.  During drowsiness and sleep, there is an increase in theta and delta slowing with poorly formed vertex waves and sleep spindles seen. Photic stimulation did not elicit any  abnormalities.  There were no epileptiform discharges or electrographic seizures seen.    EKG lead was unremarkable.  Impression: This predominantly drowsy and asleep  EEG is abnormal due to moderate diffuse slowing of the background.  Clinical Correlation of the above findings indicates diffuse cerebral dysfunction that is non-specific in etiology and can be seen with hypoxic/ischemic injury, toxic/metabolic encephalopathies, or medication effect.  The absence of epileptiform discharges does not rule out a clinical diagnosis of epilepsy.  Clinical correlation is advised.   Ellouise Newer, M.D.

## 2016-12-17 NOTE — Progress Notes (Signed)
Initial Nutrition Assessment  DOCUMENTATION CODES:   Not applicable  INTERVENTION:   Carnation Instant Breakfast PO BID, each supplement provides 220 kcal and 13 grams of protein.   NUTRITION DIAGNOSIS:   Inadequate oral intake related to poor appetite as evidenced by per patient/family report.  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 Recently diagnosed with with pulmonary embolism Feb 2018 (on Xarelto). Presents this admission with orthostatic hypotension secondary to dehydration.   Pt noted to have esophagram 04/2016 that showed moderate esophageal stricture. SLP found no indication of significant dysphagia. Recommends regular diet with thin liquids. Spoke with pt and wife at bedside. Pt experienced loss in appetite 1 week prior to admission for unknown reasons. States he typically eats three meals a day but his portioned sizes have decreased by half. Pt is drinking carnation instant breakfast each morning. Will provide CIB this admission.  Reports a UBW of 195, the last time being at that wt in June. Records indicate pt has lost 3% in one month time frame, which is not significant.   Nutrition-Focused physical exam completed. Findings are moderate fat depletion in upper arm, moderate muscle depletion in temple region, and mild edema.   Medications reviewed and include: NS @ 125 ml/hr Labs reviewed: K 3.4 (L), CBG 116-139 Albumin 3.1 (L) ALT 14 (L)  Diet Order:  Diet regular Room service appropriate? Yes; Fluid consistency: Thin  Skin:  Reviewed, no issues  Last BM:  12/16/16  Height:   Ht Readings from Last 1 Encounters:  12/15/16 5\' 11"  (1.803 m)    Weight:   Wt Readings from Last 1 Encounters:  12/15/16 185  lb 9.6 oz (84.2 kg)    Ideal Body Weight:  78.2 kg  BMI:  Body mass index is 25.89 kg/m.  Estimated Nutritional Needs:   Kcal:  1650-1850 (MSJ)  Protein:  95-105 grams (1.2-1.3 g/kg IBW)  Fluid:  >1.6 L/day  EDUCATION NEEDS:   No education needs identified at this time  [place], LDN Clinical Nutrition Pager # - [phone]

## 2016-12-17 NOTE — Progress Notes (Signed)
EEG completed; results pending.    

## 2016-12-17 NOTE — Progress Notes (Signed)
IP PROGRESS NOTE  Subjective:   Billy Romero continues to have orthostatic symptoms. He has blood-tinged sputum. Objective: Vital signs in last 24 hours: Blood pressure 109/81, pulse 98, temperature 98 F (36.7 C), temperature source Oral, resp. rate 18, height 5' 11"  (1.803 m), weight 185 lb 9.6 oz (84.2 kg), SpO2 96 %.  Intake/Output from previous day: 08/20 0701 - 08/21 0700 In: 3686.7 [P.O.:240; I.V.:3446.7] Out: 975 [Urine:975]  Physical Exam:  Lungs: Clear bilaterally Cardiac: Regular rate and rhythm Abdomen: No hepatomegaly Extremities: No leg edema   Portacath/PICC-without erythema  Lab Results:  Recent Labs  12/16/16 1623 12/17/16 1544  WBC 6.6 6.7  HGB 10.8* 11.1*  HCT 32.3* 32.9*  PLT 185 176    BMET  Recent Labs  12/16/16 0500 12/16/16 1623  NA 142 140  K 3.6 3.4*  CL 106 105  CO2 26 27  GLUCOSE 124* 120*  BUN 12 12  CREATININE 1.14 1.00  CALCIUM 12.0* 11.3*     Studies/Results: No results found.  Medications: I have reviewed the patient's current medications.  Assessment/Plan: 1. 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 Radiation completed 06/10/2014  PET scan 07/01/2014 revealed improvement in the hypermetabolic esophagus mass and mediastinal lymphadenopathy  upper endoscopy 04/20/2015 -mid esophagus with mild narrowing , 2 small nodules measuring approximate 5 mm -biopsy with scant and detached fragments with high-grade squamous dysplasia , brushings negative for malignant cells.  CT chest/abdomen/pelvis 05/25/2015 with development of bilateral pulmonary nodules; paratracheal and subcarinal nodes  improved/similar; new small lower paraesophageal nodes which are indeterminate; esophageal wall thickening improved since the prior PET; no evidence of metastatic disease in the abdomen or pelvis.  PET scan 06/16/2015 revealed hypermetabolic lung nodules, hypermetabolic mediastinal nodes, hypermetabolism at the mid to distal esophagus has improved  bronchoscopy/EBUS on 07/04/2015 confirmed squamous cell carcinoma involving right lower lobe brushings, right bronchial washings, a level 10 R lymph node biopsy, and right lower lobe biopsy  PD-L1 no expression on the right lower lobe biopsy (less than 1%)  Cycle 1 Pembrolizumab 07/26/2015  Cycle 2 Pembrolizumab 08/16/2015  Cycle 3 Pembrolizumab 09/06/2015  Cycle 4 Pembrolizumab 09/27/2015  CT chest 10/11/2015-stable/decreased size of pulmonary nodules, enlargement of several mediastinal nodes  Cycle 5 Pembrolizumab 10/18/2015  Cycle 6 Pembrolizumab 11/08/2015   Cycle 7 Pembrolizumab08/05/2015  Cycle 8 Pembrolizumab08/23/2017  CT chest 01/15/2016-slight progression of mediastinal/upper abdominal lymphadenopathy and enlargement of a right lower lobe nodule  Cycle 9 Pembrolizumab 01/17/2016   Cycle 10 Pembrolizumab10/02/2016  Cycle 11 Pembrolizumab 02/28/2016  Cycle 12 Pembrolizumab 03/20/2016  CT chest 04/25/2016-minimal progression of chest lymphadenopathy and lung nodules, thickening at the mid thoracic esophagus  MRI brain 04/30/2016-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 with involvement of the right orbit, no brain metastases  Palliative radiation right sphenoid lesion beginning 05/02/2016  CT chest 06/13/2016-left lower lobe pulmonary embolus, increased mediastinal and hilar adenopathy, septal thickening and clustered nodules in the right lower lobe, gastrohepatic lymphadenopathy  Cycle 1 FOLFOX 06/24/2016  Cycle 2 FOLFOX 07/15/2016  Cycle 3 FOLFOX 07/29/2016  Cycle 4 FOLFOX  08/12/2016  Cycle 5 FOLFOX 08/27/2016  Restaging CTs of the head and chest on 09/09/2016-decreased hilar/mediastinal lymphadenopathy, decreased lung nodules, stable esophageal thickening. Decreased right orbital mass, progressive calvarial metastases  compared to an MRI from 04/30/2016  Cycle 6 FOLFOX 09/10/2016  Cycle 7 FOLFOX 09/25/2016  Xeloda initiated 7 days on/7 days off 11/06/2016  Chest CT 11/27/2016-persistent midesophagus thickening, increased chest lymphadenopathy and multifocal nodularity/septal thickening bilaterally, new upper abdominal adenopathy  2. Solid/liquid dysphagia secondary to #1  Jejunostomy feeding tube placement 04/07/2014  Feeding tube removed 05/30/2014.  3. Small bowel obstruction following placement of the jejunostomy feeding tube-resolved  4. History of ulcerative colitis  5. Aspiration pneumonia December 2015  6. 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 Fever/cough/dyspnea 04/30/2016-nasal swab positive for influenza A , chest x-ray with a left lower lobe Pneumonia -treated with Tamiflu and Levaquin   8. Admission 06/13/2016 with a cough/left-sided chest pain/dyspnea/tachycardia/low-grade fever-pulmonary embolism confirmed on a chest CT, progressive chest adenopathy, inflammatory changes versus lymphatic tumor spread in the right lower lobe.Maintained on Xarelto.  9. Port-A-Cath placement 06/11/2016  10. Dysphagia. Barium swallow 05/01/2016 with circumferential narrowing of the midesophagus causing moderate degree of stricture which would not allow the 13 mm barium tablet to pass. The dysphagia improved spontaneously. He developed recurrent dysphagia to solids 06/15/2016. He is tolerating liquids.  11. Recurrent syncope events-borderline low cortisol 07/08/2016, symptoms improved with hydrocortisone replacement  12.  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  Admission 12/15/2016 with orthostasis/syncope  Billy Romero continues to have orthostatic blood pressure changes despite intravenous hydration the orthostasis may be related to autonomic neuropathy. I discussed the case with Dr. Fanny Skates yesterday. She is scheduled to see him on 12/19/2016 to consider enrollment on a clinical trial at [facility].  Recommendations: 1. Continue intravenous hydration 2. Begin trial of Florinef and support stockings, add midodrine if the Florinef does not help 3. Plan to keep appointment at [facility] for 12/19/2016  I will continue following Billy Romero he is in the hospital.     LOS: 2 days   Donneta Romberg, MD   12/17/2016, 4:21 PM

## 2016-12-17 NOTE — Progress Notes (Signed)
[facility] Progress Note  Patient: Billy Romero PZW:[id number]   PCP: Lujean Amel, MD DOB: 1956/04/15   DOA: 12/15/2016   DOS: 12/17/2016   Date of Service: the patient was seen and examined on 12/17/2016  Subjective: headache, vision changes unchanged. Orthostatic symptoms with dizziness unchanged. No nausea no vomiting. Has some bright red blood streaked sputum when he coughs.  Brief hospital course: Pt. with PMH of metastatic esophageal cancer, PE, on Xarelto, recurrent syncope, GERD; admitted on 12/15/2016, presented with complaint of passing out, was found to have orthostatic hypotension. Currently further plan is further workup of syncope.  Assessment and Plan: 1. Recurrent syncope Patient has recurrent syncope ongoing for last many years with significantly positive orthostatic. Multiple admissions for the same in the past. Echocardiogram unremarkable. No reoccurrence here in the hospital but remains dizzy when he changes his position and starts blacking out. Orthostatics are positive. Patient drinks significant amount of iced tea on a daily basis likely leading to dehydration. Currently continue IV hydration, compression stockings, and Florinef. may require outpatient 30 day monitor. No significant event in telemetry. EEG negative. Also add midodrine  2. Adrenal insufficiency Low cortisol levels checked in March 2018. No Cosyntropin stimulation test done in the past but the patient has been on hydrocortisone 20 mg twice a day since last few months. At present we'll add 0.05 mg Florinef and monitor the response. Would require endocrinology as an outpatient.  3 hypercalcemia Due to bony metastasis and malignancy. Continue aggressive IV hydration with normal saline. Given one dose of Zometa. Not overtly symptomatic, we'll continue to monitor daily calcium levels.  4. Metastatic esophageal cancer follows up with Dr. Benay Spice, has an appointment with Dr. Fanny Skates to get  enrolled in a clinical trial. Speech therapy consulted for dysphagia. 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 CT scan of the chest shows left upper lobe pulmonary artery encasement from the tumor as well as multiple pulmonary nodular lesion and multilevel adenopathy. unchanged as compared to prior. CT head shows extensive involvement of the calvarium, expansile lesion involving the right frontal bone. Has metastasis involving skull base. Also has mass affect on the anterior right frontal lobe as well as temporal lobes. No midline shift. No acute infarct. Has worsening headache due to this lesions. We will currently provide pain control, oncology currently following for follow-up on recommendation.  5. Hypokalemia. Replaced and will recheck tomorrow.  6.cough.hemoptysis Likely due to his malignancy. We will provide cough suppression. Hemoptysis most likely due to irritation from cough.   Diet: Regular diet  DVT Prophylaxis: on therapeutic anticoagulation.  Advance goals of care discussion: full code  Family Communication: no family was present at bedside, at the time of interview.   Disposition:  Discharge to home.  Consultants: oncology Procedures: none  Antibiotics: Anti-infectives    None       Objective: Physical Exam: Vitals:   12/16/16 2110 12/17/16 0632 12/17/16 1453 12/17/16 1645  BP: 121/77  109/81 127/80  Pulse: 91  98 (!) 102  Resp: 18 18 18 18   Temp: 97.9 F (36.6 C) 98.1 F (36.7 C) 98 F (36.7 C)   TempSrc: Oral Oral Oral   SpO2: 97% 94% 96% 96%  Weight:      Height:        Intake/Output Summary (Last 24 hours) at 12/17/16 1922 Last data filed at 12/17/16 1656  Gross per 24 hour  Intake           3377.5 ml  Output  1475 ml  Net           1902.5 ml   Filed Weights   12/15/16 1247 12/15/16 1640  Weight: 85.7 kg (189 lb) 84.2 kg (185 lb 9.6 oz)   General: Alert, Awake and Oriented to Time, Place and Person. Appear in moderate distress,  affect appropriate Eyes: PERRL, Conjunctiva normal ENT: Oral Mucosa clear moist. Neck: difficult to assess JVD, no Abnormal Mass Or lumps Cardiovascular: S1 and S2 Present, no Murmur, Peripheral Pulses Present Respiratory: normal respiratory effort, Bilateral Air entry equal and Decreased, no use of accessory muscle, Clear to Auscultation, no Crackles, no wheezes Abdomen: Bowel Sound present, Soft and no tenderness, no hernia Skin: no redness, no Rash, no induration Extremities: no Pedal edema, no calf tenderness Neurologic: Grossly no focal neuro deficit. Bilaterally Equal motor strength  Data Reviewed: CBC:  Recent Labs Lab 12/15/16 1321 12/16/16 0500 12/16/16 1623 12/17/16 1544  WBC 9.1 8.0 6.6 6.7  HGB 12.6* 11.9* 10.8* 11.1*  HCT 37.3* 35.7* 32.3* 32.9*  MCV 91.6 90.6 91.8 91.4  PLT 205 201 185 801   Basic Metabolic Panel:  Recent Labs Lab 12/15/16 1321 12/16/16 0500 12/16/16 1623 12/17/16 1544  NA 141 142 140 140  K 3.3* 3.6 3.4* 3.0*  CL 103 106 105 106  CO2 27 26 27 28   GLUCOSE 116* 124* 120* 126*  BUN 13 12 12 9   CREATININE 1.17 1.14 1.00 0.90  CALCIUM 12.5* 12.0* 11.3* 9.9    Liver Function Tests:  Recent Labs Lab 12/16/16 0500 12/17/16 1544  AST 21  21 17   ALT 14*  14* 12*  ALKPHOS 119  119 100  BILITOT 0.7  0.7 0.6  PROT 6.2*  6.2* 5.4*  ALBUMIN 3.1*  3.1* 2.6*   No results for input(s): LIPASE, AMYLASE in the last 168 hours. No results for input(s): AMMONIA in the last 168 hours. Coagulation Profile: No results for input(s): INR, PROTIME in the last 168 hours. Cardiac Enzymes: No results for input(s): CKTOTAL, CKMB, CKMBINDEX, TROPONINI in the last 168 hours. BNP (last 3 results) No results for input(s): PROBNP in the last 8760 hours. CBG:  Recent Labs Lab 12/15/16 1251  GLUCAP 122*   Studies: No results found.  Scheduled Meds: . baclofen  10 mg Oral QHS  . benzonatate  100 mg Oral TID  . chlorpheniramine-HYDROcodone  5  mL Oral Q12H  . fludrocortisone  0.05 mg Oral Daily  . hydrocortisone  20 mg Oral BID  . midodrine  5 mg Oral TID WC  . pantoprazole  40 mg Oral Daily  . rivaroxaban  20 mg Oral Q supper   Continuous Infusions: . sodium chloride 125 mL/hr at 12/17/16 1454   PRN Meds: acetaminophen, HYDROcodone-acetaminophen, HYDROcodone-homatropine, menthol-cetylpyridinium, morphine injection, ondansetron (ZOFRAN) IV, promethazine, sodium chloride flush  Time spent: 35 minutes  Author: Berle Mull, MD [facility] Pager: [phone] 12/17/2016 7:22 PM  If 7PM-7AM, please contact night-coverage at [email or web address], password [email or web address]

## 2016-12-17 NOTE — Progress Notes (Signed)
Pt's wife back at bedside. Pt states, "I don't need Home Health, don't listen to her." Pt's wife asked for [facility], but pt states no." Explained to pt and wife, pt's PCP can orders HH when pt is discharged.

## 2016-12-18 ENCOUNTER — Ambulatory Visit: Payer: BLUE CROSS/BLUE SHIELD | Admitting: Oncology

## 2016-12-18 ENCOUNTER — Other Ambulatory Visit: Payer: Self-pay

## 2016-12-18 DIAGNOSIS — C154 Malignant neoplasm of middle third of esophagus: Secondary | ICD-10-CM

## 2016-12-18 LAB — BASIC METABOLIC PANEL
ANION GAP: 6 (ref 5–15)
BUN: 8 mg/dL (ref 6–20)
CHLORIDE: 110 mmol/L (ref 101–111)
CO2: 24 mmol/L (ref 22–32)
Calcium: 9.2 mg/dL (ref 8.9–10.3)
Creatinine, Ser: 0.91 mg/dL (ref 0.61–1.24)
GFR calc non Af Amer: >60 mL/min (ref >60)
Glucose, Bld: 122 mg/dL — ABNORMAL HIGH (ref 65–99)
POTASSIUM: 3 mmol/L — AB (ref 3.5–5.1)
SODIUM: 140 mmol/L (ref 135–145)

## 2016-12-18 LAB — CBC
HCT: 32.8 % — ABNORMAL LOW (ref 39.0–52.0)
HEMOGLOBIN: 11 g/dL — AB (ref 13.0–17.0)
MCH: 30.5 pg (ref 26.0–34.0)
MCHC: 33.5 g/dL (ref 30.0–36.0)
MCV: 90.9 fL (ref 78.0–100.0)
Platelets: 171 10*3/uL (ref 150–400)
RBC: 3.61 MIL/uL — AB (ref 4.22–5.81)
RDW: 14.4 % (ref 11.5–15.5)
WBC: 4.7 10*3/uL (ref 4.0–10.5)

## 2016-12-18 LAB — MAGNESIUM: MAGNESIUM: 1.3 mg/dL — AB (ref 1.7–2.4)

## 2016-12-18 MED ORDER — MIDODRINE HCL 5 MG PO TABS: 5.0000 mg | ORAL_TABLET | Freq: Three times a day (TID) | ORAL | 0 refills | Status: AC

## 2016-12-18 MED ORDER — HEPARIN SOD (PORK) LOCK FLUSH 100 UNIT/ML IV SOLN
500.0000 [IU] | INTRAVENOUS | Status: AC | PRN
  Administered 2016-12-18: 500 [IU]

## 2016-12-18 MED ORDER — BENZONATATE 100 MG PO CAPS: 100.0000 mg | ORAL_CAPSULE | Freq: Three times a day (TID) | ORAL | 0 refills | Status: AC

## 2016-12-18 MED ORDER — FLUDROCORTISONE ACETATE 0.1 MG PO TABS: 0.0500 mg | ORAL_TABLET | Freq: Every day | ORAL | 0 refills | Status: AC

## 2016-12-18 NOTE — Progress Notes (Signed)
Occupational Therapy Treatment Patient Details Name: Billy Romero MRN: [id number] DOB: April 04, 1956 Today's Date: 12/18/2016    History of present illness Pt admitted with cough, dizziness, SOB and falls. Pt found to have orthostatic hypotension, dysphagia, dehydration, hypercalcemia. 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   OT comments  Pt donned clothes.  Educated on sitting to don pants rather than standing for safety.    Follow Up Recommendations  No OT follow up    Equipment Recommendations  None recommended by OT    Recommendations for Other Services      Precautions / Restrictions Precautions Precautions: Fall Precaution Comments: watch BP Restrictions Weight Bearing Restrictions: No       Mobility Bed Mobility Overal bed mobility: Independent                Transfers Overall transfer level: Needs assistance Equipment used: None Transfers: Sit to/from Stand Sit to Stand: Min guard         General transfer comment: min/guard due to unsteadiness. No c/o dizziness        ADL either performed or assessed with clinical judgement   ADL                   Upper Body Dressing : Set up;Sitting   Lower Body Dressing: Set up;Sit to/from stand;Cueing for safety;Cueing for sequencing   Toilet Transfer: Min guard;Comfort height toilet   Toileting- Clothing Manipulation and Hygiene: Set up;Sit to/from stand;Cueing for safety;Cueing for sequencing         General ADL Comments: encoouraged wife to be with pt at home to prevent a fall and use walker .                 Cognition Arousal/Alertness: Awake/alert Behavior During Therapy: WFL for tasks assessed/performed Overall Cognitive Status: Impaired/Different from baseline Area of Impairment: Safety/judgement                         Safety/Judgement: Decreased awareness of deficits                          Pertinent Vitals/ Pain       Pain Assessment: No/denies pain         Frequency  Min 2X/week        Progress Toward Goals  OT Goals(current goals can now be found in the care plan section)  Progress towards OT goals: Progressing toward goals     Plan Discharge plan remains appropriate       AM-PAC PT "6 Clicks" Daily Activity     Outcome Measure   Help from another person eating meals?: None Help from another person taking care of personal grooming?: A Little Help from another person toileting, which includes using toliet, bedpan, or urinal?: A Little Help from another person bathing (including washing, rinsing, drying)?: A Little Help from another person to put on and taking off regular upper body clothing?: None Help from another person to put on and taking off regular lower body clothing?: A Little 6 Click Score: 20    End of Session Equipment Utilized During Treatment: Gait belt;Rolling walker  OT Visit Diagnosis: Unsteadiness on feet (R26.81);Repeated falls (R29.6);Pain   Activity Tolerance Patient tolerated treatment well (BP to 90/40 with standing)   Patient Left with call bell/phone within reach;in chair;with family/visitor present   Nurse Communication  (aware of  BP)        Time: 0867-6195 OT Time Calculation (min): 9 min  Charges: OT General Charges $OT Visit: 1 Procedure OT Treatments $Self Care/Home Management : 8-22 mins  Billy Romero, [name]   Billy Romero 12/18/2016, 2:22 PM

## 2016-12-18 NOTE — Progress Notes (Signed)
Received a call from RN to ask for RW. RW ordered for pt from [facility].

## 2016-12-18 NOTE — Discharge Summary (Addendum)
Physician Discharge Summary  RITVIK MCZEAL RWE:[id number] DOB: 1956/03/23 DOA: 12/15/2016  PCP: Lujean Amel, MD  Admit date: 12/15/2016 Discharge date: 12/18/2016  Admitted From: home  Disposition:  home  Recommendations for Outpatient Follow-up:  1. Follow up with PCP for orthostatic hypotension 2. Follow up with Dr. Fanny Skates at [facility] tomorrow for metastatic esophageal cancer 3. Added florinef and midodrine  Home Health:  None.  Patient declined HH PT Equipment/Devices:  Rolling walker   Discharge Condition:  Stable, improved CODE STATUS:  Full code  Diet recommendation:  regular   Brief/Interim Summary:  Pt. with PMH of metastatic esophageal cancer, PE, on Xarelto, recurrent syncope, GERD; admitted on 12/15/2016, presented with complaint of passing out, was found to have orthostatic hypotension.  He was given IVF, continued on hydrocortisone and started on midodrine and florinef.  He remained orthostatic, but his blood pressures overall improved and he was less symptomatic when going from sitting to standing.  He was advised to wear thigh high TED hose, move from lying to seated to standing very gradually and with assistance and to use his walker.  He should sit immediately if he feels he may pass out.    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   Multiple admissions for the same in the past.  -  Echocardiogram unremarkable. -  Orthostasis improved but did not resolve with IV hydration, compression stockings, and Florinef. -  No significant event in telemetry -  EEG negative -  Started florinef and midodrine  2. Adrenal insufficiency, low cortisol levels checked in March 2018. - No Cosyntropin  stimulation test seen in system. - Patient has been on hydrocortisone 20 mg twice a day since last few months.  3 hypercalcemia Due to bony metastasis and malignancy. Continue aggressive IV hydration with normal saline. Given one dose of Zometa. Not overtly symptomatic, we'll continue to monitor daily calcium levels.  4. Metastatic esophageal cancer, follows with Dr. 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 -  CT scan of the chest shows left upper lobe pulmonary artery encasement from the tumor as well as multiple pulmonary nodular lesion and multilevel adenopathy. unchanged as compared to prior. -  CT head shows extensive involvement of the calvarium, expansile lesion involving the right frontal bone. Has metastasis involving skull base. Also has mass affect on the anterior right frontal lobe as well as temporal lobes which is causing some blurry vision -  No midline shift. No acute infarct. -  Has worsening headache due to this lesions. -  F/u with Dr. Fanny Skates tomorrow  5. Hypokalemia, received several doses of oral potassium while inpatient  6.cough with scant hemoptysis Likely due to his malignancy, hemoglobin stable and stable on room air  Discharge Instructions  Discharge Instructions    Call MD for:  persistant dizziness or light-headedness    Complete by:  As directed    Diet general    Complete by:  As directed    Increase activity slowly    Complete by:  As directed        Medication List    TAKE these medications   benzonatate 100 MG capsule Commonly known as:  TESSALON Take 1 capsule (100 mg total) by mouth 3 (three) times daily.   fludrocortisone 0.1  MG tablet Commonly known as:  FLORINEF Take 0.5 tablets (0.05 mg total) by mouth daily.   HYDROcodone-acetaminophen 5-325 MG tablet Commonly known as:  NORCO/VICODIN Take 1 tablet by mouth 3 (three) times daily as needed for moderate pain. for pain   hydrocortisone 20 MG tablet Commonly known as:   CORTEF Take 1 tablet (20 mg total) by mouth 2 (two) times daily.   lidocaine-prilocaine cream Commonly known as:  EMLA Apply to port 1 hour prior to port access and cover with plastic wrap   midodrine 5 MG tablet Commonly known as:  PROAMATINE Take 1 tablet (5 mg total) by mouth 3 (three) times daily with meals.   omeprazole 20 MG capsule Commonly known as:  PRILOSEC Take 20 mg by mouth daily.   potassium chloride SA 20 MEQ tablet Commonly known as:  K-DUR,KLOR-CON Take 1 tablet (20 mEq total) by mouth daily. What changed:  additional instructions   promethazine 25 MG tablet Commonly known as:  PHENERGAN Take 1 tablet (25 mg total) by mouth every 6 (six) hours as needed for nausea or vomiting. What changed:  when to take this   rivaroxaban 20 MG Tabs tablet Commonly known as:  XARELTO Take 1 tablet (20 mg total) by mouth daily with supper.      Follow-up Information    Koirala, Dibas, MD. Schedule an appointment as soon as possible for a visit.   Specialty:  Family Medicine Why:  as needed Contact information: [address] [address] [phone]        Garnetta Buddy, MD Follow up on 12/19/2016.   Specialty:  Internal Medicine Contact information: [facility] [place] [address] [phone]          No Known Allergies  Consultations: oncology   Procedures/Studies: Ct Head Wo Contrast  Result Date: 12/15/2016 CLINICAL DATA:  Esophageal carcinoma with metastases. Several recent falls. EXAM: CT HEAD WITHOUT CONTRAST CT CERVICAL SPINE WITHOUT CONTRAST TECHNIQUE: Multidetector CT imaging of the head and cervical spine was performed following the standard protocol without intravenous contrast. Multiplanar CT image reconstructions of the cervical spine were also generated. COMPARISON:  Head CT Sep 09, 2016 FINDINGS: CT HEAD FINDINGS Brain: There is mild diffuse atrophy. There is no intra-axial mass or  hemorrhage. There are no appreciable subdural or epidural fluid collections. No midline shift. There is slight small vessel disease in the centra semiovale bilaterally. No acute infarct evident. Vascular: No hyperdense vessel. No appreciable vascular calcification. Skull: There is extensive bony metastatic disease throughout the calvarium. There is destruction of the superior midline and adjacent parietal bones with extensive periosteal re-expansion in this area. There is an expansile lytic lesion involving the right frontal bone with localized impression on the right superior frontal lobe due to this extensive bony expansion. There is extensive lytic change throughout the temporal bones bilaterally, more severe on the right than on the left, with periosteal expansion on the right causing modest mass effect on the posterior frontal and temporal lobes on the right due to periosteal expansion. Tumor is noted throughout the sphenoid bones, more severe on the left than on the right. Sinuses/Orbits: There is mild mucosal thickening in the anterior right maxillary antrum. There is mild mucosal thickening in several ethmoid air cells. No intraorbital lesions are evident. Other: There is opacification in the mastoid air cells bilaterally, more severe on the right than on the left. CT CERVICAL SPINE FINDINGS Alignment: There is no  evidence spondylolisthesis. Skull base and vertebrae: There are lytic metastases involving the skullbase and posterior colitis without mass effect on the posterior fossa or midbrain regions. Craniocervical junction region appears normal. There are no appreciable blastic or lytic lesions in the cervical spine. No fracture evident. There is anterior screw and plate fixation from L2-G4 with support hardware intact. Soft tissues and spinal canal: Prevertebral soft tissues and predental space regions are normal. There are no paraspinous lesions. No cord or canal hematoma evident. Disc levels: There are  disc spacers at C5-6 and C6-7. There is moderately severe disc space narrowing at C7-T1. There is a prominent anterior osteophyte at C4. There is multilevel facet osteoarthritic change. There is exit foraminal narrowing due to bony hypertrophy at C3-4 bilaterally, with impression on exiting nerve roots bilaterally at this level. There is no frank disc extrusion or high-grade stenosis. Upper chest: No consolidation in the visualized upper lung zones. Air is noted in the upper cervical esophagus. Other: There is calcification in each carotid artery. IMPRESSION: CT head: Extensive lytic bony metastases throughout the bony calvarium and skullbase. Areas of expansion into the periosteum cause mass effect on the anterior right frontal lobe and to a lesser extent on the right posterior frontal and right temporal lobes. No midline shift. No intra-axial lesion. No subdural or epidural fluid collections are evident. No acute infarct evident. There is mastoid disease bilaterally, more severe on the right than on the left. CT cervical spine: No fracture or spondylolisthesis. Postoperative change from C5-C7 anteriorly. Multilevel osteoarthritic change with exit foraminal narrowing greatest at C3-4 bilaterally. There are foci of carotid artery calcification bilaterally. Electronically Signed   By: Lowella Grip III M.D.   On: 12/15/2016 15:41   Ct Chest W Contrast  Result Date: 11/28/2016 CLINICAL DATA:  61 year old male with history of metastatic esophageal cancer diagnosed originally in 2015 status post chemotherapy and radiation therapy, now complete. Ongoing oral chemotherapy. Hemoptysis for the past 2 weeks. EXAM: CT CHEST WITH CONTRAST TECHNIQUE: Multidetector CT imaging of the chest was performed during intravenous contrast administration. CONTRAST:  61mL ISOVUE-300 IOPAMIDOL (ISOVUE-300) INJECTION 61% COMPARISON:  Multiple priors, most recently chest CT 09/09/2016. FINDINGS: Cardiovascular: Heart size is normal.  Small amount of pericardial fluid and/or thickening, unlikely to be of any hemodynamic significance at this time. No associated pericardial calcification. There is aortic atherosclerosis, as well as atherosclerosis of the great vessels of the mediastinum and the coronary arteries, including calcified atherosclerotic plaque in the left anterior descending coronary artery. Right-sided internal jugular single-lumen porta cath with tip terminating at the superior cavoatrial junction. Mediastinum/Nodes: Mass-like thickening of the midesophagus shortly below the level of the carina, best appreciated on axial image 69 of series 2. Worsening multifocal mediastinal and high bilateral hilar lymphadenopathy compared to the prior study, compatible with progression of metastatic disease. Specific examples include a 15 mm Linder Prajapati axis prevascular lymph node (axial image 58 of series 2) which previously measured only 1 cm, bulky left hilar nodal tissue which extends into the left suprahilar region measuring up to 17 mm in Nikitha Mode axis (axial image 57 of series 2), significantly increased compared to the prior study, and bulky right hilar nodal tissue most evident extending into the right infrahilar region where this measures up to 17 mm in Lataunya Ruud axis (axial image 81 of series 2) also significantly increased. No axillary lymphadenopathy. Lungs/Pleura: Worsening multifocal nodularity and nodular septal thickening throughout the lungs bilaterally, most severe in the right lower lobe, compatible with progressive lymphangitic  spread of tumor. Several other more isolated pulmonary nodules also appear substantially larger than the prior examination, most notable for a 2.2 x 1.3 cm nodule in the medial right lower lobe (axial image 106 of series 5) which previously measured only 5 mm on 09/09/2016. No pleural effusions. Upper Abdomen: Multiple enlarged and borderline enlarged upper abdominal lymph nodes, new compared to the prior study, the  largest of which is in the celiac axis nodal station measuring up to 12 mm in Chais Fehringer axis (axial image 143 of series 2). Musculoskeletal: Orthopedic fixation hardware in the lower cervical spine incompletely imaged. There are no aggressive appearing lytic or blastic lesions noted in the visualized portions of the skeleton. IMPRESSION: 1. Persistent mass-like thickening of the midesophagus with marked increased metastatic disease to mediastinal and bilateral hilar lymph nodes, developing lymphangitic spread of tumor throughout both lungs, multiple more isolated pulmonary nodules which likely reflect additional hematogenous metastases, and worsening upper abdominal lymphadenopathy, as detailed above. 2. Aortic atherosclerosis, in addition to left anterior descending coronary artery disease. 3. Additional incidental findings, as above. Aortic Atherosclerosis (ICD10-I70.0). Electronically Signed   By: Vinnie Langton M.D.   On: 11/28/2016 09:41   Ct Angio Chest Pe W/cm &/or Wo Cm  Result Date: 12/15/2016 CLINICAL DATA:  Shortness of breath. Metastatic esophageal carcinoma EXAM: CT ANGIOGRAPHY CHEST WITH CONTRAST TECHNIQUE: Multidetector CT imaging of the chest was performed using the standard protocol during bolus administration of intravenous contrast. Multiplanar CT image reconstructions and MIPs were obtained to evaluate the vascular anatomy. CONTRAST:  100 mL Isovue 370 nonionic COMPARISON:  Chest CT November 27, 2016 FINDINGS: Cardiovascular: There is no appreciable pulmonary embolus. Note that adenopathy in pieces several pulmonary arteries. There is marked narrowing of the left upper lobe pulmonary artery due to encasement from tumor. There is no thoracic aortic aneurysm or dissection. The visualized great vessels appear normal. There is a fairly small pericardial effusion present. There is slight calcification in the thoracic aorta. Mediastinum/Nodes: Thyroid is diminutive but unremarkable in appearance. There  is extensive adenopathy. In the aortopulmonary window region, there are multiple confluent lymph nodes with the confluence of adenopathy in this area measuring 3.1 x 3.2 cm. There is an adjacent lymph node to the left of the aortic arch measuring 2.8 x 1.8 cm. There is adenopathy in the right paratracheal region measuring 1.9 x 1.2 cm. There is adenopathy throughout the left hilar region with the largest confluence of adenopathy in this area measuring 2.5 x 1.8 cm. There is extensive adenopathy in the right hilar region with the largest confluence of adenopathy in this region measuring 3.3 x 2.7 cm. There is sub- carinal adenopathy the with the largest individual lymph node measuring 1.7 x 1.5 cm. There is a mass in the mid esophagus with marked wall thickening in this area, unchanged from prior study. There is air in the esophagus superior to the mid esophageal mass. There is esophageal wall thickening throughout the distal half of the esophagus. Lungs/Pleura: There is nodular interstitial thickening throughout much of the right lower lobe, likely due to lymphangitic spread of tumor. There is focal airspace consolidation in the medial aspect of the superior segment of the right lower lobe measuring 2.5 x 2.3 cm. There are smaller nodular opacities elsewhere in the right lower lobe. There is a peripheral nodular opacity abutting the pleura in the posterior segment of the left upper lobe measuring 8 x 8 mm. There is a nodular opacity in the posterior segment of the  left upper lobe more inferiorly measuring 1.0 x 0.5 cm. Several small nodular opacities are noted in the left upper lobe more anteriorly, all felt to represent metastatic foci. There is no appreciable pleural effusion. Upper Abdomen: There is incomplete visualization of a lymph node anterior to the medial left diaphragmatic crus measuring 1.6 x 1.2 cm. Visualized upper abdominal structures otherwise appear unremarkable. Musculoskeletal: There is multilevel  degenerative change. There is postoperative change in the lower cervical spine region. No blastic or lytic bone lesions are evident. Review of the MIP images confirms the above findings. IMPRESSION: 1. No demonstrable pulmonary embolus. Note that tumor encases proximal pulmonary arteries with the greatest degree of encasement and narrowing involving the left upper lobe pulmonary artery. 2. Mass midesophagus with thickening of the distal half of the esophageal wall. Appearance unchanged compared to recent study. 3. Multiple pulmonary nodular lesions with the greatest confluent nodularity in the right lower lobe. Suspect lymphangitic spread of tumor in the right lower lobe as well. 4. Multilevel adenopathy, similar to recent study, consistent with extensive metastatic disease. 5.  Mild aortic atherosclerosis. 6.  Fairly small pericardial effusion. Aortic Atherosclerosis (ICD10-I70.0). Electronically Signed   By: Lowella Grip III M.D.   On: 12/15/2016 15:32   Ct Cervical Spine Wo Contrast  Result Date: 12/15/2016 CLINICAL DATA:  Esophageal carcinoma with metastases. Several recent falls. EXAM: CT HEAD WITHOUT CONTRAST CT CERVICAL SPINE WITHOUT CONTRAST TECHNIQUE: Multidetector CT imaging of the head and cervical spine was performed following the standard protocol without intravenous contrast. Multiplanar CT image reconstructions of the cervical spine were also generated. COMPARISON:  Head CT Sep 09, 2016 FINDINGS: CT HEAD FINDINGS Brain: There is mild diffuse atrophy. There is no intra-axial mass or hemorrhage. There are no appreciable subdural or epidural fluid collections. No midline shift. There is slight small vessel disease in the centra semiovale bilaterally. No acute infarct evident. Vascular: No hyperdense vessel. No appreciable vascular calcification. Skull: There is extensive bony metastatic disease throughout the calvarium. There is destruction of the superior midline and adjacent parietal bones  with extensive periosteal re-expansion in this area. There is an expansile lytic lesion involving the right frontal bone with localized impression on the right superior frontal lobe due to this extensive bony expansion. There is extensive lytic change throughout the temporal bones bilaterally, more severe on the right than on the left, with periosteal expansion on the right causing modest mass effect on the posterior frontal and temporal lobes on the right due to periosteal expansion. Tumor is noted throughout the sphenoid bones, more severe on the left than on the right. Sinuses/Orbits: There is mild mucosal thickening in the anterior right maxillary antrum. There is mild mucosal thickening in several ethmoid air cells. No intraorbital lesions are evident. Other: There is opacification in the mastoid air cells bilaterally, more severe on the right than on the left. CT CERVICAL SPINE FINDINGS Alignment: There is no evidence spondylolisthesis. Skull base and vertebrae: There are lytic metastases involving the skullbase and posterior colitis without mass effect on the posterior fossa or midbrain regions. Craniocervical junction region appears normal. There are no appreciable blastic or lytic lesions in the cervical spine. No fracture evident. There is anterior screw and plate fixation from J1-O8 with support hardware intact. Soft tissues and spinal canal: Prevertebral soft tissues and predental space regions are normal. There are no paraspinous lesions. No cord or canal hematoma evident. Disc levels: There are disc spacers at C5-6 and C6-7. There is moderately severe disc  space narrowing at C7-T1. There is a prominent anterior osteophyte at C4. There is multilevel facet osteoarthritic change. There is exit foraminal narrowing due to bony hypertrophy at C3-4 bilaterally, with impression on exiting nerve roots bilaterally at this level. There is no frank disc extrusion or high-grade stenosis. Upper chest: No  consolidation in the visualized upper lung zones. Air is noted in the upper cervical esophagus. Other: There is calcification in each carotid artery. IMPRESSION: CT head: Extensive lytic bony metastases throughout the bony calvarium and skullbase. Areas of expansion into the periosteum cause mass effect on the anterior right frontal lobe and to a lesser extent on the right posterior frontal and right temporal lobes. No midline shift. No intra-axial lesion. No subdural or epidural fluid collections are evident. No acute infarct evident. There is mastoid disease bilaterally, more severe on the right than on the left. CT cervical spine: No fracture or spondylolisthesis. Postoperative change from C5-C7 anteriorly. Multilevel osteoarthritic change with exit foraminal narrowing greatest at C3-4 bilaterally. There are foci of carotid artery calcification bilaterally. Electronically Signed   By: Lowella Grip III M.D.   On: 12/15/2016 15:41   Subjective:  Head throbbed a little when he stood up this morning for orthostatic blood pressures, but he felt overall much better and did not feel like he was going to pass out.  Has had headache and blurry vision for the last few weeks.  Denies chest pains, SOB.    Discharge Exam: Vitals:   12/17/16 2135 12/18/16 0555  BP: 134/82 129/80  Pulse: 97 (!) 102  Resp: 18 18  Temp: 98.8 F (37.1 C) 98.6 F (37 C)  SpO2: 92% 98%   Vitals:   12/17/16 1453 12/17/16 1645 12/17/16 2135 12/18/16 0555  BP: 109/81 127/80 134/82 129/80  Pulse: 98 (!) 102 97 (!) 102  Resp: 18 18 18 18   Temp: 98 F (36.7 C)  98.8 F (37.1 C) 98.6 F (37 C)  TempSrc: Oral  Oral Oral  SpO2: 96% 96% 92% 98%  Weight:      Height:        General: Pt is alert, awake, not in acute distress Cardiovascular: RRR, S1/S2 +, no rubs, no gallops Respiratory: CTA bilaterally, no wheezing, no rhonchi Abdominal: Soft, NT, ND, bowel sounds + Extremities: no edema, no cyanosis Neuro:  No facial  droop, strength grossly intact.  Seen ambulating with slight ataxic appearance with rolling walker with PT.      The results of significant diagnostics from this hospitalization (including imaging, microbiology, ancillary and laboratory) are listed below for reference.     Microbiology: No results found for this or any previous visit (from the past 240 hour(s)).   Labs: BNP (last 3 results) No results for input(s): BNP in the last 8760 hours. Basic Metabolic Panel:  Recent Labs Lab 12/15/16 1321 12/16/16 0500 12/16/16 1623 12/17/16 1544 12/18/16 0953  NA 141 142 140 140 140  K 3.3* 3.6 3.4* 3.0* 3.0*  CL 103 106 105 106 110  CO2 27 26 27 28 24   GLUCOSE 116* 124* 120* 126* 122*  BUN 13 12 12 9 8   CREATININE 1.17 1.14 1.00 0.90 0.91  CALCIUM 12.5* 12.0* 11.3* 9.9 9.2  MG  --   --   --   --  1.3*   Liver Function Tests:  Recent Labs Lab 12/16/16 0500 12/17/16 1544  AST 21  21 17   ALT 14*  14* 12*  ALKPHOS 119  119 100  BILITOT  0.7  0.7 0.6  PROT 6.2*  6.2* 5.4*  ALBUMIN 3.1*  3.1* 2.6*   No results for input(s): LIPASE, AMYLASE in the last 168 hours. No results for input(s): AMMONIA in the last 168 hours. CBC:  Recent Labs Lab 12/15/16 1321 12/16/16 0500 12/16/16 1623 12/17/16 1544 12/18/16 0953  WBC 9.1 8.0 6.6 6.7 4.7  HGB 12.6* 11.9* 10.8* 11.1* 11.0*  HCT 37.3* 35.7* 32.3* 32.9* 32.8*  MCV 91.6 90.6 91.8 91.4 90.9  PLT 205 201 185 176 171   Cardiac Enzymes: No results for input(s): CKTOTAL, CKMB, CKMBINDEX, TROPONINI in the last 168 hours. BNP: Invalid input(s): POCBNP CBG:  Recent Labs Lab 12/15/16 1251  GLUCAP 122*   D-Dimer No results for input(s): DDIMER in the last 72 hours. Hgb A1c No results for input(s): HGBA1C in the last 72 hours. Lipid Profile No results for input(s): CHOL, HDL, LDLCALC, TRIG, CHOLHDL, LDLDIRECT in the last 72 hours. Thyroid function studies No results for input(s): TSH, T4TOTAL, T3FREE, THYROIDAB in  the last 72 hours.  Invalid input(s): FREET3 Anemia work up No results for input(s): VITAMINB12, FOLATE, FERRITIN, TIBC, IRON, RETICCTPCT in the last 72 hours. Urinalysis    Component Value Date/Time   COLORURINE YELLOW 12/15/2016 1321   APPEARANCEUR CLOUDY (A) 12/15/2016 1321   LABSPEC 1.011 12/15/2016 1321   PHURINE 7.0 12/15/2016 1321   GLUCOSEU NEGATIVE 12/15/2016 1321   HGBUR NEGATIVE 12/15/2016 1321   BILIRUBINUR NEGATIVE 12/15/2016 1321   KETONESUR NEGATIVE 12/15/2016 1321   PROTEINUR NEGATIVE 12/15/2016 1321   UROBILINOGEN 1.0 04/11/2014 1830   NITRITE NEGATIVE 12/15/2016 1321   LEUKOCYTESUR NEGATIVE 12/15/2016 1321   Sepsis Labs Invalid input(s): PROCALCITONIN,  WBC,  LACTICIDVEN   Time coordinating discharge: Over 30 minutes  SIGNED:   Janece Canterbury, MD  [facility] 12/18/2016, 6:42 PM Pager   If 7PM-7AM, please contact night-coverage [email or web address]

## 2016-12-18 NOTE — Progress Notes (Signed)
Physical Therapy Treatment Patient Details Name: Billy Romero MRN: [id number] DOB: [birth year] Today's Date: 12/18/2016    History of Present Illness Pt admitted with cough, dizziness, SOB and falls. Pt found to have orthostatic hypotension, dysphagia, dehydration, hypercalcemia. 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    PT Comments    Gait trial with and without AD.  Pt steadier with RW and not as fatigued.  Recommend RW for home use and pt agreeable.  Recommend HHPT, but pt is hesitant about [place] services despite education.  Pt educated to use RW and have someone with him while he is walking due to unsteadiness.   Follow Up Recommendations  Home health PT;Supervision/Assistance - 24 hour;Supervision for mobility/OOB     Equipment Recommendations  Rolling walker with 5" wheels    Recommendations for Other Services       Precautions / Restrictions Precautions Precautions: Fall Precaution Comments: watch BP Restrictions Weight Bearing Restrictions: No    Mobility  Bed Mobility Overal bed mobility: Independent                Transfers Overall transfer level: Needs assistance Equipment used: None Transfers: Sit to/from Stand Sit to Stand: Min guard         General transfer comment: min/guard due to unsteadiness. No c/o dizziness  Ambulation/Gait Ambulation/Gait assistance: Min guard Ambulation Distance (Feet): 80 Feet (x 2) Assistive device: Rolling walker (2 wheeled);None Gait Pattern/deviations: Step-through pattern;Staggering right;Drifts right/left     General Gait Details: Cues for safety. Pt ambulated with IV pole and then no AD with decreased balance.  HR 125 o2 91%,  After rest, ambulated with RW for balance and energy conservation.  Pt liked the RW and agreed he would use one for a little at home.   Stairs            Wheelchair Mobility    Modified Rankin (Stroke Patients  Only)       Balance                                            Cognition Arousal/Alertness: Awake/alert Behavior During Therapy: WFL for tasks assessed/performed Overall Cognitive Status: Impaired/Different from baseline Area of Impairment: Safety/judgement                         Safety/Judgement: Decreased awareness of deficits            Exercises      General Comments        Pertinent Vitals/Pain Pain Assessment: No/denies pain    Home Living                      Prior Function            PT Goals (current goals can now be found in the care plan section) Acute Rehab PT Goals PT Goal Formulation: With patient Time For Goal Achievement: 12/30/16 Potential to Achieve Goals: Good Progress towards PT goals: Progressing toward goals    Frequency    Min 3X/week      PT Plan Current plan remains appropriate    Co-evaluation              AM-PAC PT "6 Clicks" Daily Activity  Outcome Measure  Difficulty turning over in bed (including adjusting  bedclothes, sheets and blankets)?: None Difficulty moving from lying on back to sitting on the side of the bed? : None Difficulty sitting down on and standing up from a chair with arms (e.g., wheelchair, bedside commode, etc,.)?: A Little Help needed moving to and from a bed to chair (including a wheelchair)?: A Little Help needed walking in hospital room?: A Little Help needed climbing 3-5 steps with a railing? : A Little 6 Click Score: 20    End of Session Equipment Utilized During Treatment: Gait belt Activity Tolerance: Patient tolerated treatment well Patient left: in chair;with call bell/phone within reach;with chair alarm set Nurse Communication: Mobility status PT Visit Diagnosis: Difficulty in walking, not elsewhere classified (R26.2)     Time: 6384-6659 PT Time Calculation (min) (ACUTE ONLY): 21 min  Charges:  $Gait Training: 8-22 mins                     G Codes:       Billy Romero, [place] Pager [phone] 12/18/2016    Billy Romero 12/18/2016, 12:32 PM

## 2016-12-24 ENCOUNTER — Telehealth: Payer: Self-pay

## 2016-12-24 NOTE — Telephone Encounter (Signed)
No answer, unable to leave message °

## 2016-12-25 ENCOUNTER — Ambulatory Visit: Payer: BLUE CROSS/BLUE SHIELD

## 2016-12-25 ENCOUNTER — Ambulatory Visit (HOSPITAL_COMMUNITY)
Admission: RE | Admit: 2016-12-25 | Discharge: 2016-12-25 | Disposition: A | Discharge status: Home / Self Care | Payer: BLUE CROSS/BLUE SHIELD | Source: Ambulatory Visit | Attending: Oncology | Admitting: Oncology

## 2016-12-25 ENCOUNTER — Other Ambulatory Visit: Payer: Self-pay

## 2016-12-25 ENCOUNTER — Other Ambulatory Visit: Payer: Self-pay | Admitting: Nurse Practitioner

## 2016-12-25 ENCOUNTER — Ambulatory Visit (HOSPITAL_BASED_OUTPATIENT_CLINIC_OR_DEPARTMENT_OTHER): Payer: BLUE CROSS/BLUE SHIELD | Admitting: Oncology

## 2016-12-25 ENCOUNTER — Telehealth: Payer: Self-pay

## 2016-12-25 ENCOUNTER — Other Ambulatory Visit (HOSPITAL_BASED_OUTPATIENT_CLINIC_OR_DEPARTMENT_OTHER): Payer: BLUE CROSS/BLUE SHIELD

## 2016-12-25 VITALS — BP 116/96 | HR 145 | Temp 98.9°F | Resp 20

## 2016-12-25 VITALS — BP 116/96 | HR 112 | Temp 98.9°F | Resp 20 | Ht 71.0 in | Wt 179.8 lb

## 2016-12-25 DIAGNOSIS — C154 Malignant neoplasm of middle third of esophagus: Secondary | ICD-10-CM

## 2016-12-25 DIAGNOSIS — C7951 Secondary malignant neoplasm of bone: Secondary | ICD-10-CM

## 2016-12-25 DIAGNOSIS — C7801 Secondary malignant neoplasm of right lung: Secondary | ICD-10-CM

## 2016-12-25 DIAGNOSIS — R51 Headache: Secondary | ICD-10-CM | POA: Diagnosis not present

## 2016-12-25 DIAGNOSIS — C771 Secondary and unspecified malignant neoplasm of intrathoracic lymph nodes: Secondary | ICD-10-CM

## 2016-12-25 DIAGNOSIS — R0609 Other forms of dyspnea: Secondary | ICD-10-CM | POA: Diagnosis not present

## 2016-12-25 DIAGNOSIS — R05 Cough: Secondary | ICD-10-CM

## 2016-12-25 DIAGNOSIS — R918 Other nonspecific abnormal finding of lung field: Secondary | ICD-10-CM | POA: Insufficient documentation

## 2016-12-25 DIAGNOSIS — C7949 Secondary malignant neoplasm of other parts of nervous system: Secondary | ICD-10-CM

## 2016-12-25 DIAGNOSIS — C7802 Secondary malignant neoplasm of left lung: Secondary | ICD-10-CM | POA: Insufficient documentation

## 2016-12-25 DIAGNOSIS — E876 Hypokalemia: Secondary | ICD-10-CM

## 2016-12-25 DIAGNOSIS — Z95828 Presence of other vascular implants and grafts: Secondary | ICD-10-CM

## 2016-12-25 LAB — COMPREHENSIVE METABOLIC PANEL
ALBUMIN: 2.8 g/dL — AB (ref 3.5–5.0)
ALK PHOS: 138 U/L (ref 40–150)
ALT: 19 U/L (ref 0–55)
AST: 21 U/L (ref 5–34)
Anion Gap: 11 mEq/L (ref 3–11)
BUN: 8.2 mg/dL (ref 7.0–26.0)
CALCIUM: 9.3 mg/dL (ref 8.4–10.4)
CO2: 24 mEq/L (ref 22–29)
Chloride: 105 mEq/L (ref 98–109)
Creatinine: 0.8 mg/dL (ref 0.7–1.3)
EGFR: >90 mL/min/{1.73_m2} (ref >90)
Glucose: 111 mg/dl (ref 70–140)
POTASSIUM: 3 meq/L — AB (ref 3.5–5.1)
Sodium: 140 mEq/L (ref 136–145)
Total Bilirubin: 0.89 mg/dL (ref 0.20–1.20)
Total Protein: 6.9 g/dL (ref 6.4–8.3)

## 2016-12-25 MED ORDER — DEXAMETHASONE 4 MG PO TABS: 4.0000 mg | ORAL_TABLET | Freq: Three times a day (TID) | ORAL | 0 refills | Status: DC

## 2016-12-25 MED ORDER — DEXAMETHASONE 4 MG PO TABS: 4.0000 mg | ORAL_TABLET | Freq: Three times a day (TID) | ORAL | 0 refills | Status: AC

## 2016-12-25 MED ORDER — SODIUM CHLORIDE 0.9% FLUSH
10.0000 mL | INTRAVENOUS | Status: DC | PRN
  Administered 2016-12-25: 10 mL via INTRAVENOUS
  Filled 2016-12-25: qty 10

## 2016-12-25 MED ORDER — HEPARIN SOD (PORK) LOCK FLUSH 100 UNIT/ML IV SOLN
500.0000 [IU] | Freq: Once | INTRAVENOUS | Status: AC | PRN
  Administered 2016-12-25: 500 [IU] via INTRAVENOUS
  Filled 2016-12-25: qty 5

## 2016-12-25 NOTE — Telephone Encounter (Signed)
LVM F/U on pt after chest xray this AM

## 2016-12-25 NOTE — Patient Instructions (Signed)
Implanted Port Home Guide An implanted port is a type of central line that is placed under the skin. Central lines are used to provide IV access when treatment or nutrition needs to be given through a person's veins. Implanted ports are used for long-term IV access. An implanted port may be placed because:  You need IV medicine that would be irritating to the small veins in your hands or arms.  You need long-term IV medicines, such as antibiotics.  You need IV nutrition for a long period.  You need frequent blood draws for lab tests.  You need dialysis.  Implanted ports are usually placed in the chest area, but they can also be placed in the upper arm, the abdomen, or the leg. An implanted port has two main parts:  Reservoir. The reservoir is round and will appear as a small, raised area under your skin. The reservoir is the part where a needle is inserted to give medicines or draw blood.  Catheter. The catheter is a thin, flexible tube that extends from the reservoir. The catheter is placed into a large vein. Medicine that is inserted into the reservoir goes into the catheter and then into the vein.  How will I care for my incision site? Do not get the incision site wet. Bathe or shower as directed by your health care provider. How is my port accessed? Special steps must be taken to access the port:  Before the port is accessed, a numbing cream can be placed on the skin. This helps numb the skin over the port site.  Your health care provider uses a sterile technique to access the port. ? Your health care provider must put on a mask and sterile gloves. ? The skin over your port is cleaned carefully with an antiseptic and allowed to dry. ? The port is gently pinched between sterile gloves, and a needle is inserted into the port.  Only "non-coring" port needles should be used to access the port. Once the port is accessed, a blood return should be checked. This helps ensure that the port  is in the vein and is not clogged.  If your port needs to remain accessed for a constant infusion, a clear (transparent) bandage will be placed over the needle site. The bandage and needle will need to be changed every week, or as directed by your health care provider.  Keep the bandage covering the needle clean and dry. Do not get it wet. Follow your health care provider's instructions on how to take a shower or bath while the port is accessed.  If your port does not need to stay accessed, no bandage is needed over the port.  What is flushing? Flushing helps keep the port from getting clogged. Follow your health care provider's instructions on how and when to flush the port. Ports are usually flushed with saline solution or a medicine called heparin. The need for flushing will depend on how the port is used.  If the port is used for intermittent medicines or blood draws, the port will need to be flushed: ? After medicines have been given. ? After blood has been drawn. ? As part of routine maintenance.  If a constant infusion is running, the port may not need to be flushed.  How long will my port stay implanted? The port can stay in for as long as your health care provider thinks it is needed. When it is time for the port to come out, surgery will be   done to remove it. The procedure is similar to the one performed when the port was put in. When should I seek immediate medical care? When you have an implanted port, you should seek immediate medical care if:  You notice a bad smell coming from the incision site.  You have swelling, redness, or drainage at the incision site.  You have more swelling or pain at the port site or the surrounding area.  You have a fever that is not controlled with medicine.  This information is not intended to replace advice given to you by your health care provider. Make sure you discuss any questions you have with your health care provider. Document  Released: 04/15/2005 Document Revised: 09/21/2015 Document Reviewed: 12/21/2012 Elsevier Interactive Patient Education  2017 [employer]  

## 2016-12-25 NOTE — Progress Notes (Signed)
SATURATION QUALIFICATIONS: (This note is used to comply with regulatory documentation for home oxygen)  Patient Saturations on Room Air at Rest = 96%  Patient Saturations on Room Air while Ambulating = 91%  Patient Saturations on 2 Liters of oxygen while Ambulating = 98%  Please briefly explain why patient needs home oxygen: Patient has severe dyspnea at rest related to lymphatic tumors in lungs, oxygen sat 96%. Patient heart rate in 130's and oxygen saturation at 91% with less than 10 feet ambulation. Patient was unable to complete ambulation due to dyspnea. With 2 liters oxygen recovery was 98% and dyspnea improved.

## 2016-12-25 NOTE — Progress Notes (Signed)
[place] OFFICE PROGRESS NOTE   Diagnosis: Esophagus cancer  INTERVAL HISTORY:   Mr. Petrovic returns for scheduled visit. He was discharged from the hospital 12/18/2016 after admission with orthostasis. The orthostasis improved with support stockings, midodrine, and Florinef. No recurrent syncope. No chest pain.  He complains of a persistent cough and dyspnea. The symptoms have progressed over the past week. He saw Dr. Fanny Skates on 12/19/2016. He is not eligible for a clinical trial. She recommends considering treatment with single agent irinotecan.  He complained of hearing loss and a headache. Dr.Uronis recommends considering a repeat brain MRI.  He continues to have headaches. He has discomfort at the parietal skin [place].   Objective:  Vital signs in last 24 hours:  Blood pressure (!) 116/96, pulse (!) 45, temperature 98.9 F (37.2 C), temperature source Oral, resp. rate 20, height 5' 11"  (1.803 m), weight 179 lb 12.8 oz (81.6 kg), SpO2 96 %.    HEENT: Neck without mass  Resp: Lungs clear bilaterally  Cardio: Regular rate and rhythm, tachycardia  GI: No hepatosplenomegaly, nontender  Vascular: No leg edema   Skin:Nodular mass at the parietal scalp   Portacath/PICC-without erythema  Lab Results:  Lab Results  Component Value Date   WBC 4.7 12/18/2016   HGB 11.0 (L) 12/18/2016   HCT 32.8 (L) 12/18/2016   MCV 90.9 12/18/2016   PLT 171 12/18/2016   NEUTROABS 7.5 (H) 12/03/2016    CMP     Component Value Date/Time   NA 140 12/25/2016 0830   K 3.0 (LL) 12/25/2016 0830   CL 110 12/18/2016 0953   CO2 24 12/25/2016 0830   GLUCOSE 111 12/25/2016 0830   BUN 8.2 12/25/2016 0830   CREATININE 0.8 12/25/2016 0830   CALCIUM 9.3 12/25/2016 0830   PROT 6.9 12/25/2016 0830   ALBUMIN 2.8 (L) 12/25/2016 0830   AST 21 12/25/2016 0830   ALT 19 12/25/2016 0830   ALKPHOS 138 12/25/2016 0830   BILITOT 0.89 12/25/2016 0830   GFRNONAA >60 12/18/2016 0953   GFRAA >60 12/18/2016 0953     Imaging:  Dg Chest 2 View  Result Date: 12/25/2016 CLINICAL DATA:  Shortness breath and cough. Esophageal cancer with metastatic disease. EXAM: CHEST  2 VIEW COMPARISON:  CT chest 12/15/2016 FINDINGS: The heart size is normal. A right IJ Port-A-Cath is stable. Bilateral suprahilar densities are compatible with known metastatic disease. Progressive right lower lobe opacities are compatible with known metastases. Progressive right-sided pleural disease is noted as well. The left lung is otherwise clear. IMPRESSION: 1. Progressive nodular and pleural disease in the right lung compatible with aggressive metastatic disease. Superimposed infection is considered less likely. 2. Left suprahilar known metastases. Electronically Signed   By: San Morelle M.D.   On: 12/25/2016 10:15    Medications: I have reviewed the patient's current medications.  Assessment/Plan: 1. 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 Radiation completed 06/10/2014  PET scan 07/01/2014 revealed improvement in the hypermetabolic esophagus mass and mediastinal lymphadenopathy  upper endoscopy 04/20/2015 -mid esophagus with mild narrowing , 2 small nodules measuring approximate 5 mm -biopsy with scant and detached fragments with high-grade squamous dysplasia , brushings negative for malignant cells.  CT chest/abdomen/pelvis 05/25/2015 with development of bilateral pulmonary nodules;  paratracheal and subcarinal nodes improved/similar; new small lower paraesophageal nodes which are indeterminate; esophageal wall thickening improved since the prior PET; no evidence of  metastatic disease in the abdomen or pelvis.  PET scan 06/16/2015 revealed hypermetabolic lung nodules, hypermetabolic mediastinal nodes, hypermetabolism at the mid to distal esophagus has improved  bronchoscopy/EBUS on 07/04/2015 confirmed squamous cell carcinoma involving right lower lobe brushings, right bronchial washings, a level 10 R lymph node biopsy, and right lower lobe biopsy  PD-L1 no expression on the right lower lobe biopsy (less than 1%)  Cycle 1 Pembrolizumab 07/26/2015  Cycle 2 Pembrolizumab 08/16/2015  Cycle 3 Pembrolizumab 09/06/2015  Cycle 4 Pembrolizumab 09/27/2015  CT chest 10/11/2015-stable/decreased size of pulmonary nodules, enlargement of several mediastinal nodes  Cycle 5 Pembrolizumab 10/18/2015  Cycle 6 Pembrolizumab 11/08/2015   Cycle 7 Pembrolizumab08/05/2015  Cycle 8 Pembrolizumab08/23/2017  CT chest 01/15/2016-slight progression of mediastinal/upper abdominal lymphadenopathy and enlargement of a right lower lobe nodule  Cycle 9 Pembrolizumab 01/17/2016   Cycle 10 Pembrolizumab10/02/2016  Cycle 11 Pembrolizumab 02/28/2016  Cycle 12 Pembrolizumab 03/20/2016  CT chest 04/25/2016-minimal progression of chest lymphadenopathy and lung nodules, thickening at the mid thoracic esophagus  MRI brain 04/30/2016-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 with involvement of the right orbit, no brain metastases  Palliative radiation right sphenoid lesion beginning 05/02/2016  CT chest 06/13/2016-left lower lobe pulmonary embolus, increased mediastinal and hilar adenopathy, septal thickening and clustered nodules in the right lower lobe, gastrohepatic lymphadenopathy  Cycle 1 FOLFOX 06/24/2016  Cycle 2 FOLFOX 07/15/2016  Cycle 3 FOLFOX 07/29/2016  Cycle 4 FOLFOX 08/12/2016  Cycle 5 FOLFOX 08/27/2016  Restaging CTs of the head and chest on 09/09/2016-decreased hilar/mediastinal lymphadenopathy, decreased lung nodules,  stable esophageal thickening. Decreased right orbital mass, progressive calvarial metastases compared to an MRI from 04/30/2016  Cycle 6 FOLFOX 09/10/2016  Cycle 7 FOLFOX 09/25/2016  Xeloda initiated 7 days on/7 days off 11/06/2016  Chest CT 11/27/2016-persistent midesophagus thickening, increased chest lymphadenopathy and multifocal nodularity/septal thickening bilaterally, new upper abdominal adenopathy  2. Solid/liquid dysphagia secondary to #1  Jejunostomy feeding tube placement 04/07/2014  Feeding tube removed 05/30/2014.  3. Small bowel obstruction following placement of the jejunostomy feeding tube-resolved  4. History of ulcerative colitis  5. Aspiration pneumonia December 2015  6. 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 Fever/cough/dyspnea 04/30/2016-nasal swab positive for influenza A , chest x-ray with a left lower lobe Pneumonia -treated with Tamiflu and Levaquin   8. Admission 06/13/2016 with a cough/left-sided chest pain/dyspnea/tachycardia/low-grade fever-pulmonary embolism confirmed on a chest CT, progressive chest adenopathy, inflammatory changes versus lymphatic tumor spread in the right lower lobe.Maintained on Xarelto.  9. Port-A-Cath placement 06/11/2016  10. Dysphagia. Barium swallow 05/01/2016 with circumferential narrowing of the midesophagus causing moderate degree of stricture which would not allow the 13 mm barium tablet to pass. The dysphagia improved spontaneously. He developed recurrent dysphagia to solids 06/15/2016. He is tolerating liquids.  11. Recurrent syncope events-borderline low cortisol 07/08/2016,symptoms improved with hydrocortisone replacement  12. 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  Admission 12/15/2016 with orthostasis/syncope-improved with hydration  and Florinef, discharged 12/18/2016  15.  Progressive dyspnea/cough 12/25/2016-likely secondary to lymphangitic tumor spread in the lungs   Disposition:  Mr. Dacy has metastatic esophagus cancer. He has progressive metastatic disease in the chest. He is symptomatic with a cough and dyspnea. The dyspnea is now severe. A chest x-ray suggest  progressive lymphatic tumor spread in the right chest. I suspect this explains the dyspnea. There may also be a component related to chest adenopathy.  I discussed the difficult situation with Mr. Nephew and his family. I recommend Hospice care he is in agreement. He felt much better when he was placed on oxygen in the office today. He will begin a trial of Decadron and an attempt to palliate the lymphatic tumor spread.  We discussed CPR and ACLS issues. Mr. Walz was not able to make a decision on CODE STATUS today. He will continue this discussion with the hospice team.  He will discontinue midodrine and hydrocortisone.  Mr. Noah will return for an office visit 12/31/2016.  40 minutes were spent with the patient today. The majority of the time was used for counseling and coordination of care.  Donneta Romberg, MD  12/25/2016  11:20 AM

## 2016-12-26 ENCOUNTER — Telehealth: Payer: Self-pay

## 2016-12-26 ENCOUNTER — Other Ambulatory Visit: Payer: Self-pay | Admitting: Nurse Practitioner

## 2016-12-26 DIAGNOSIS — C154 Malignant neoplasm of middle third of esophagus: Secondary | ICD-10-CM

## 2016-12-26 DIAGNOSIS — C7951 Secondary malignant neoplasm of bone: Secondary | ICD-10-CM

## 2016-12-26 DIAGNOSIS — E876 Hypokalemia: Secondary | ICD-10-CM

## 2016-12-26 NOTE — Telephone Encounter (Signed)
Called patient with upcoming appointment for 9/4 @ 9:30. Per los to over lap.

## 2016-12-31 ENCOUNTER — Other Ambulatory Visit: Payer: Self-pay

## 2016-12-31 ENCOUNTER — Telehealth: Payer: Self-pay | Admitting: Oncology

## 2016-12-31 ENCOUNTER — Ambulatory Visit (HOSPITAL_BASED_OUTPATIENT_CLINIC_OR_DEPARTMENT_OTHER): Payer: BLUE CROSS/BLUE SHIELD | Admitting: Oncology

## 2016-12-31 VITALS — BP 118/72 | HR 100 | Temp 98.1°F | Resp 20 | Ht 71.0 in | Wt 182.2 lb

## 2016-12-31 DIAGNOSIS — C7951 Secondary malignant neoplasm of bone: Secondary | ICD-10-CM | POA: Diagnosis not present

## 2016-12-31 DIAGNOSIS — R05 Cough: Secondary | ICD-10-CM | POA: Diagnosis not present

## 2016-12-31 DIAGNOSIS — G620 Drug-induced polyneuropathy: Secondary | ICD-10-CM | POA: Diagnosis not present

## 2016-12-31 DIAGNOSIS — C7802 Secondary malignant neoplasm of left lung: Secondary | ICD-10-CM | POA: Diagnosis not present

## 2016-12-31 DIAGNOSIS — C771 Secondary and unspecified malignant neoplasm of intrathoracic lymph nodes: Secondary | ICD-10-CM

## 2016-12-31 DIAGNOSIS — C154 Malignant neoplasm of middle third of esophagus: Secondary | ICD-10-CM

## 2016-12-31 DIAGNOSIS — C7801 Secondary malignant neoplasm of right lung: Secondary | ICD-10-CM

## 2016-12-31 DIAGNOSIS — R0609 Other forms of dyspnea: Secondary | ICD-10-CM

## 2016-12-31 MED ORDER — HYDROCODONE-ACETAMINOPHEN 5-325 MG PO TABS: 1.0000 | ORAL_TABLET | Freq: Three times a day (TID) | ORAL | 0 refills | Status: AC | PRN

## 2016-12-31 NOTE — Telephone Encounter (Signed)
Spoke to patients wife regarding upcoming September appointments.

## 2016-12-31 NOTE — Progress Notes (Signed)
[facility] OFFICE PROGRESS NOTE   Diagnosis: Esophagus cancer  INTERVAL HISTORY:   Billy Romero returns as scheduled. He reports feeling much better. The dyspnea has improved. He has a good appetite. He is ambulatory in the home. He has been evaluated by the home Hospice team. He is maintained on oxygen at home. No falls or syncope.  Objective:  Vital signs in last 24 hours:  Blood pressure 118/72, pulse 100, temperature 98.1 F (36.7 C), temperature source Oral, resp. rate 20, height _0  (1.803 m), weight 182 lb 3.2 oz (82.6 kg), SpO2 97 %.    HEENT: No thrush Resp: Lungs clear bilaterally Cardio: Regular rate and rhythm GI: No hepatosplenomegaly, nontender Vascular: No leg edema  Skin: Parietal scalp mass   Portacath/PICC-without erythema  Lab Results:  Lab Results  Component Value Date   WBC 4.7 12/18/2016   HGB 11.0 (L) 12/18/2016   HCT 32.8 (L) 12/18/2016   MCV 90.9 12/18/2016   PLT 171 12/18/2016   NEUTROABS 7.5 (H) 12/03/2016    CMP     Component Value Date/Time   NA 140 12/25/2016 0830   K 3.0 (LL) 12/25/2016 0830   CL 110 12/18/2016 0953   CO2 24 12/25/2016 0830   GLUCOSE 111 12/25/2016 0830   BUN 8.2 12/25/2016 0830   CREATININE 0.8 12/25/2016 0830   CALCIUM 9.3 12/25/2016 0830   PROT 6.9 12/25/2016 0830   ALBUMIN 2.8 (L) 12/25/2016 0830   AST 21 12/25/2016 0830   ALT 19 12/25/2016 0830   ALKPHOS 138 12/25/2016 0830   BILITOT 0.89 12/25/2016 0830   GFRNONAA >60 12/18/2016 0953   GFRAA >60 12/18/2016 0953     Medications: I have reviewed the patient's current medications.  Assessment/Plan: 1. Esophagus cancer, squamous cell carcinoma, obstructing mass noted at 30 cm from the incisors on an endoscopy 03/29/2014  Initiation of weekly Taxol/carboplatin and radiation on 04/27/2014  PET scan 05/03/2014 with a hypermetabolic mid esophagus mass and hypermetabolic mediastinal/left supraclavicular lymph nodes  Weekly  Taxol/carboplatin 5 completed 05/25/2014. Radiation completed 06/10/2014  PET scan 07/01/2014 revealed improvement in the hypermetabolic esophagus mass and mediastinal lymphadenopathy  upper endoscopy 04/20/2015 -mid esophagus with mild narrowing , 2 small nodules measuring approximate 5 mm -biopsy with scant and detached fragments with high-grade squamous dysplasia , brushings negative for malignant cells.  CT chest/abdomen/pelvis 05/25/2015 with development of bilateral pulmonary nodules; paratracheal and subcarinal nodes improved/similar; new small lower paraesophageal nodes which are indeterminate; esophageal wall thickening improved since the prior PET; no evidence of metastatic disease in the abdomen or pelvis.  PET scan 06/16/2015 revealed hypermetabolic lung nodules, hypermetabolic mediastinal nodes, hypermetabolism at the mid to distal esophagus has improved  bronchoscopy/EBUS on 07/04/2015 confirmed squamous cell carcinoma involving right lower lobe brushings, right bronchial washings, a level 10 R lymph node biopsy, and right lower lobe biopsy  PD-L1 no expression on the right lower lobe biopsy (less than 1%)  Cycle 1 Pembrolizumab 07/26/2015  Cycle 2 Pembrolizumab 08/16/2015  Cycle 3 Pembrolizumab 09/06/2015  Cycle 4 Pembrolizumab 09/27/2015  CT chest 10/11/2015-stable/decreased size of pulmonary nodules, enlargement of several mediastinal nodes  Cycle 5 Pembrolizumab 10/18/2015  Cycle 6 Pembrolizumab 11/08/2015   Cycle 7 Pembrolizumab08/05/2015  Cycle 8 Pembrolizumab08/23/2017  CT chest 01/15/2016-slight progression of mediastinal/upper abdominal lymphadenopathy and enlargement of a right lower lobe nodule  Cycle 9 Pembrolizumab 01/17/2016   Cycle 10 Pembrolizumab10/02/2016  Cycle 11 Pembrolizumab 02/28/2016  Cycle 12 Pembrolizumab 03/20/2016  CT chest 04/25/2016-minimal progression of chest  lymphadenopathy and lung nodules, thickening at the mid thoracic  esophagus  MRI brain 04/30/2016-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 with involvement of the right orbit, no brain metastases  Palliative radiation right sphenoid lesion beginning 05/02/2016  CT chest 06/13/2016-left lower lobe pulmonary embolus, increased mediastinal and hilar adenopathy, septal thickening and clustered nodules in the right lower lobe, gastrohepatic lymphadenopathy  Cycle 1 FOLFOX 06/24/2016  Cycle 2 FOLFOX 07/15/2016  Cycle 3 FOLFOX 07/29/2016  Cycle 4 FOLFOX 08/12/2016  Cycle 5 FOLFOX 08/27/2016  Restaging CTs of the head and chest on 09/09/2016-decreased hilar/mediastinal lymphadenopathy, decreased lung nodules, stable esophageal thickening. Decreased right orbital mass, progressive calvarial metastases compared to an MRI from 04/30/2016  Cycle 6 FOLFOX 09/10/2016  Cycle 7 FOLFOX 09/25/2016  Xeloda initiated 7 days on/7 days off 11/06/2016  Chest CT 11/27/2016-persistent midesophagus thickening, increased chest lymphadenopathy and multifocal nodularity/septal thickening bilaterally, new upper abdominal adenopathy  Palliative Decadron initiated 12/26/2016 with clinical improvement  2. Solid/liquid dysphagia secondary to #1  Jejunostomy feeding tube placement 04/07/2014  Feeding tube removed 05/30/2014.  3. Small bowel obstruction following placement of the jejunostomy feeding tube-resolved  4. History of ulcerative colitis  5. Aspiration pneumonia December 2015  6. 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 Fever/cough/dyspnea 04/30/2016-nasal swab positive for influenza A , chest x-ray with a left lower lobe Pneumonia -treated with Tamiflu and Levaquin   8. Admission 06/13/2016 with a cough/left-sided chest pain/dyspnea/tachycardia/low-grade fever-pulmonary embolism confirmed on a chest CT, progressive  chest adenopathy, inflammatory changes versus lymphatic tumor spread in the right lower lobe.Maintained on Xarelto.  9. Port-A-Cath placement 06/11/2016  10. Dysphagia. Barium swallow 05/01/2016 with circumferential narrowing of the midesophagus causing moderate degree of stricture which would not allow the 13 mm barium tablet to pass. The dysphagia improved spontaneously. He developed recurrent dysphagia to solids 06/15/2016. He is tolerating liquids.  11. Recurrent syncope events-borderline low cortisol 07/08/2016,symptoms improved with hydrocortisone replacement  12. 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 Admission 12/15/2016 with orthostasis/syncope-improved with hydration and Florinef, discharged 12/18/2016  15.  Progressive dyspnea/cough 12/25/2016-likely secondary to lymphangitic tumor spread in the lungs, started Decadron 12/26/2016    Disposition:  Billy Romero appears significantly improved today. The respiratory distress has improved while on Decadron. I suspect this is related to a temporary clinical response to Decadron.  We discussed treatment options including supportive care and salvage irinotecan. He would like to proceed with a trial of salvage chemotherapy. He understands the treatment will not be curative and there is a small clinical chance of a response to therapy. We reviewed the potential toxicities associated with irinotecan including the chance for nausea/vomiting, mucositis, alopecia, diarrhea, and hematologic toxicity.  The plan is to begin irinotecan 01/10/2017. He will be seen for an office visit that day. He will continue Decadron.  25 minutes were spent with the patient today. The majority of the time was used for counseling and coordination of care.  Donneta Romberg, MD  12/31/2016  3:37 PM

## 2016-12-31 NOTE — Telephone Encounter (Signed)
Gave patient AVS report.

## 2016-12-31 NOTE — Progress Notes (Signed)
DISCONTINUE OFF PATHWAY REGIMEN - Gastroesophageal   [id number]:FOLFOX (q14d):   A cycle is every 14 days:     Oxaliplatin        Dose Mod: None     Leucovorin        Dose Mod: None     5-Fluorouracil        Dose Mod: None     5-Fluorouracil        Dose Mod: None  **Always confirm dose/schedule in your pharmacy ordering system**    REASON: Disease Progression PRIOR TREATMENT: Off Pathway: FOLFOX (q14d) TREATMENT RESPONSE: Progressive Disease (PD)  START OFF PATHWAY REGIMEN - Gastroesophageal   [id number]:Irinotecan 180 mg/m2 q14 Days:   A cycle is every 14 days:     Irinotecan   **Always confirm dose/schedule in your pharmacy ordering system**    Patient Characteristics: Distant Metastases (cM1/pM1) / Locally Recurrent Disease, Squamous Cell, Esophageal & GE Junction, Second Line and Beyond, MSS / pMMR or MSI Unknown Histology: Squamous Cell Disease Classification: Esophageal Therapeutic Status: Distant Metastases (No Additional Staging) Would you be surprised if this patient died  in the next year<= I would NOT be surprised if this patient died in the next year Line of therapy: Second Line and Beyond Microsatellite/Mismatch Repair Status: Unknown Intent of Therapy: Non-Curative / Palliative Intent, Discussed with Patient 

## 2017-01-05 ENCOUNTER — Other Ambulatory Visit: Payer: Self-pay | Admitting: Oncology

## 2017-01-06 ENCOUNTER — Telehealth: Payer: Self-pay

## 2017-01-06 NOTE — Telephone Encounter (Signed)
Spoke and discussed with pt's wife at her request about their decision to move her husband to [facility]. Informed pt's wife that MD Benay Spice highly recommends [facility] as a choice for 24-hour nursing care. Pt's wife verbalized understanding and is appreciative of call back.

## 2017-01-07 ENCOUNTER — Other Ambulatory Visit: Payer: Self-pay | Admitting: Emergency Medicine

## 2017-01-07 MED ORDER — HYDROCORTISONE 20 MG PO TABS: 20.0000 mg | ORAL_TABLET | Freq: Two times a day (BID) | ORAL | 1 refills | Status: AC

## 2017-01-10 ENCOUNTER — Telehealth: Payer: Self-pay | Admitting: *Deleted

## 2017-01-10 ENCOUNTER — Ambulatory Visit: Payer: BLUE CROSS/BLUE SHIELD

## 2017-01-10 ENCOUNTER — Other Ambulatory Visit: Payer: BLUE CROSS/BLUE SHIELD

## 2017-01-10 ENCOUNTER — Ambulatory Visit: Payer: BLUE CROSS/BLUE SHIELD | Admitting: Oncology

## 2017-01-10 NOTE — Telephone Encounter (Signed)
Left message on voicemail for wife to call office with an update. Pt was scheduled to see MD today.

## 2017-01-19 ENCOUNTER — Other Ambulatory Visit: Payer: Self-pay | Admitting: Oncology

## 2017-01-24 ENCOUNTER — Other Ambulatory Visit: Payer: BLUE CROSS/BLUE SHIELD

## 2017-01-24 ENCOUNTER — Ambulatory Visit: Payer: BLUE CROSS/BLUE SHIELD

## 2017-01-24 ENCOUNTER — Ambulatory Visit: Payer: BLUE CROSS/BLUE SHIELD | Admitting: Oncology

## 2017-01-27 DEATH — deceased

## 2017-04-03 ENCOUNTER — Other Ambulatory Visit: Payer: Self-pay | Admitting: Nurse Practitioner

## 2017-07-29 NOTE — Telephone Encounter (Signed)
Open in error

## 2018-06-14 IMAGING — CR DG CHEST 2V
3 series · 3 of 3 positions shown · non-contrast
Comparison: PA and lateral chest x-ray July 04, 2015

CLINICAL DATA: Cough and fever

EXAM:
CHEST  2 VIEW

[w chest pa]
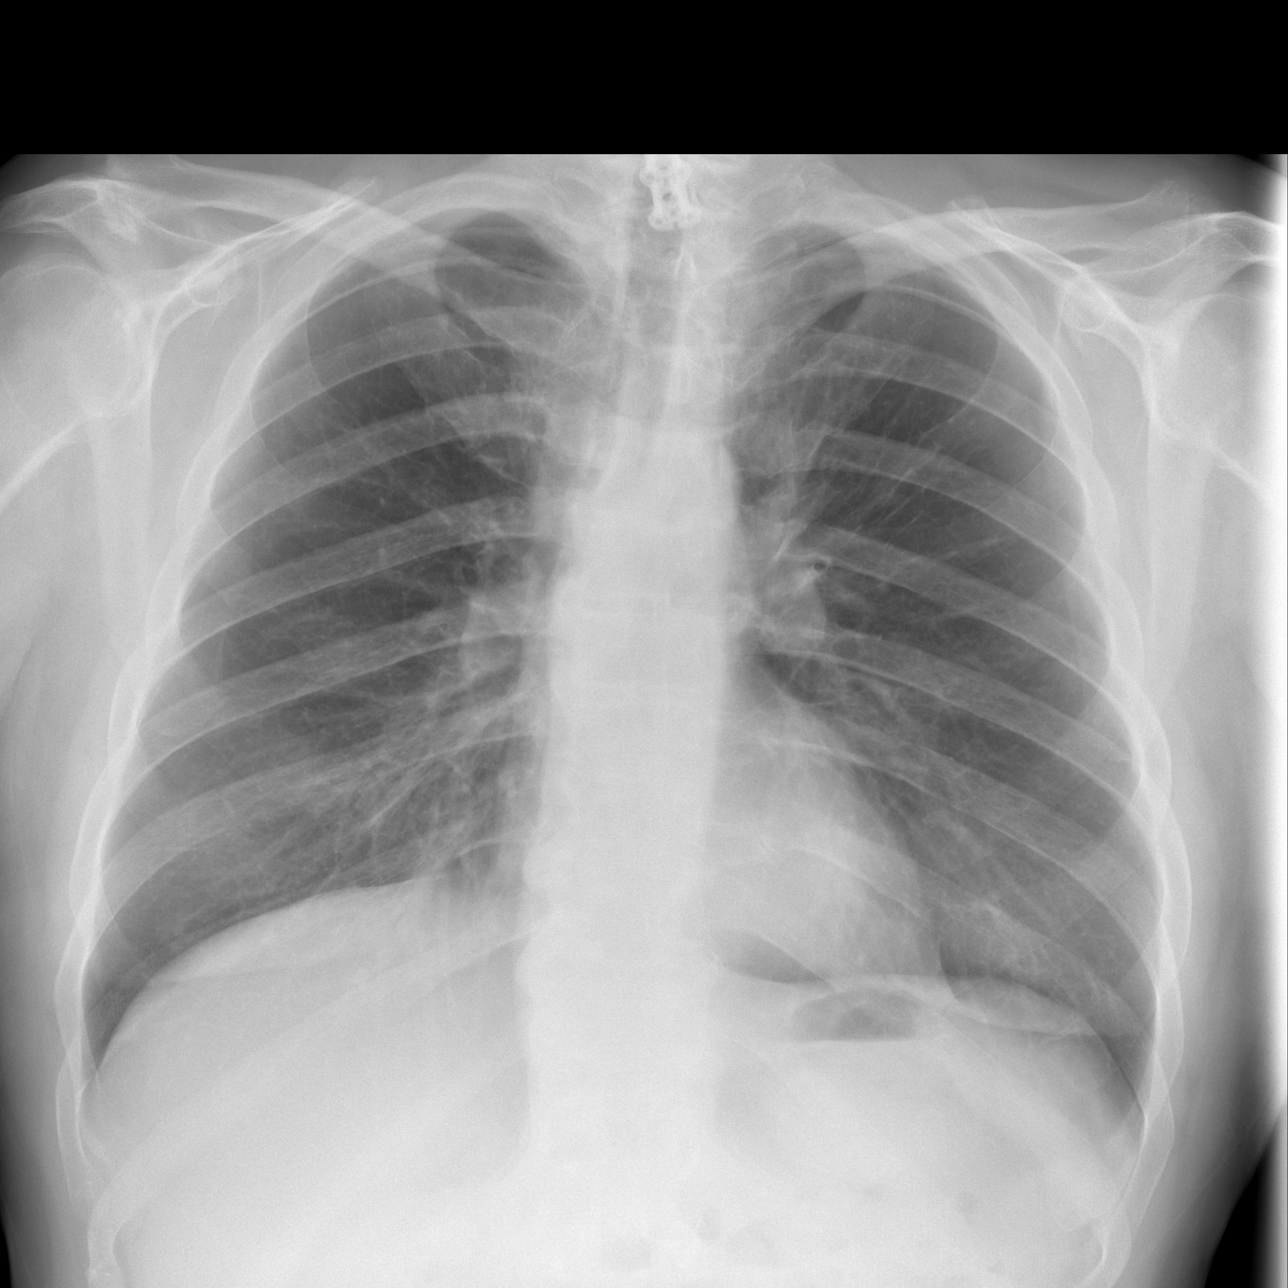

[w chest lat (1 of 2)]
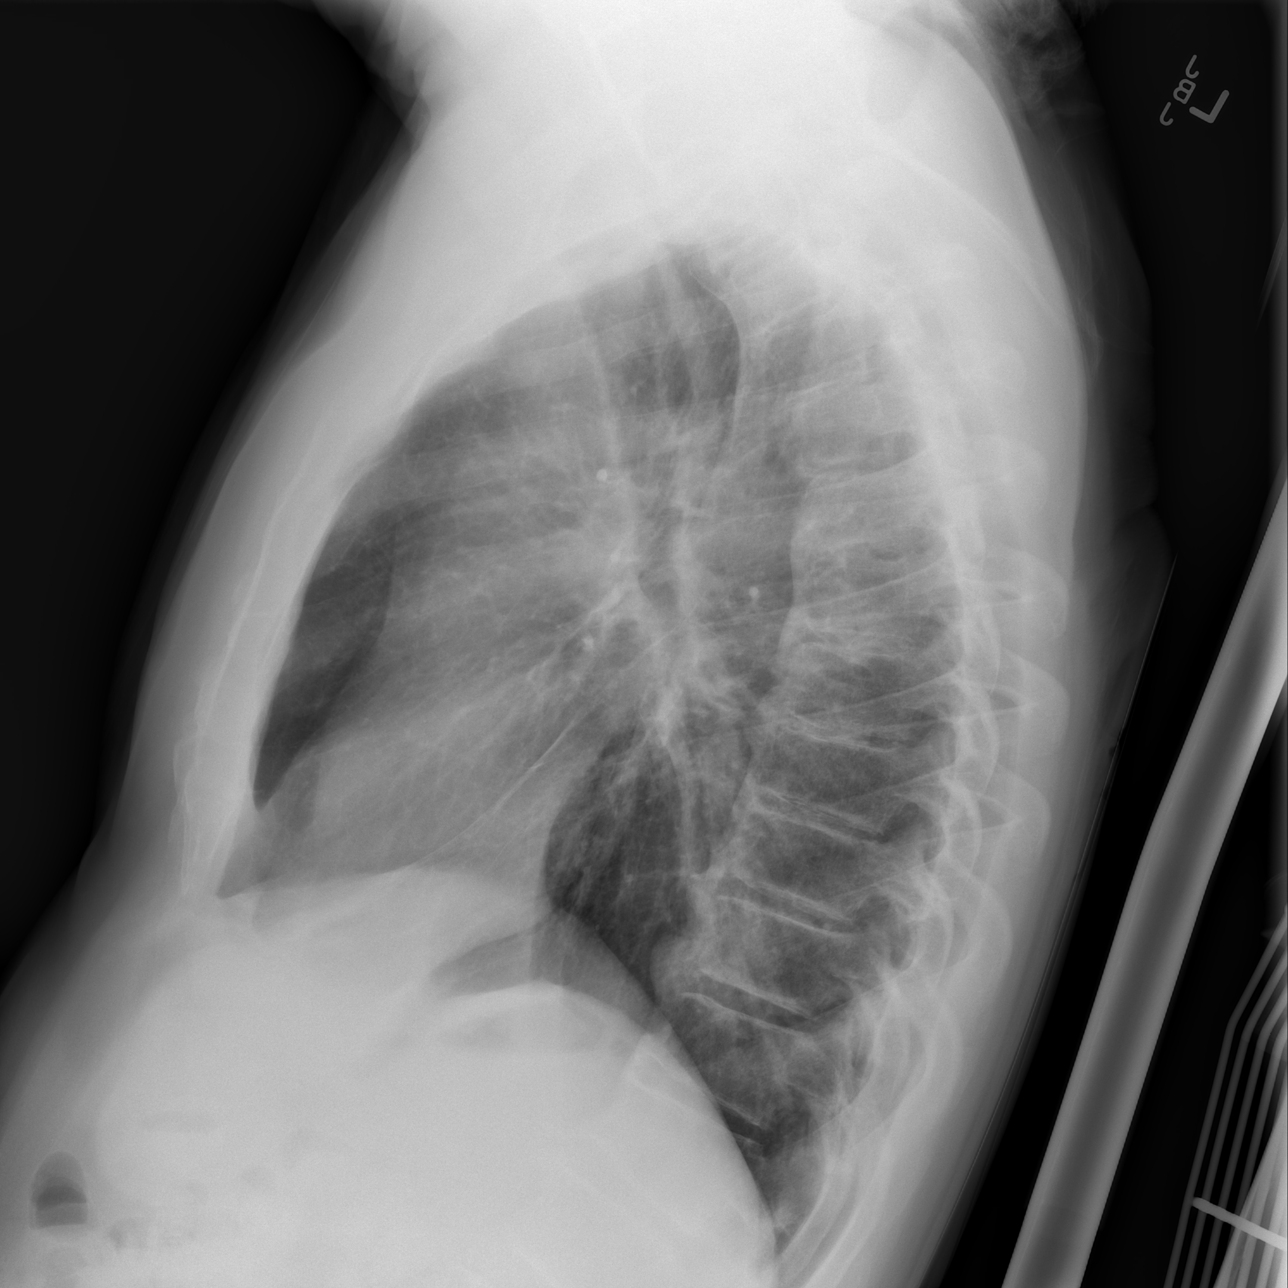

[w chest lat (2 of 2)]
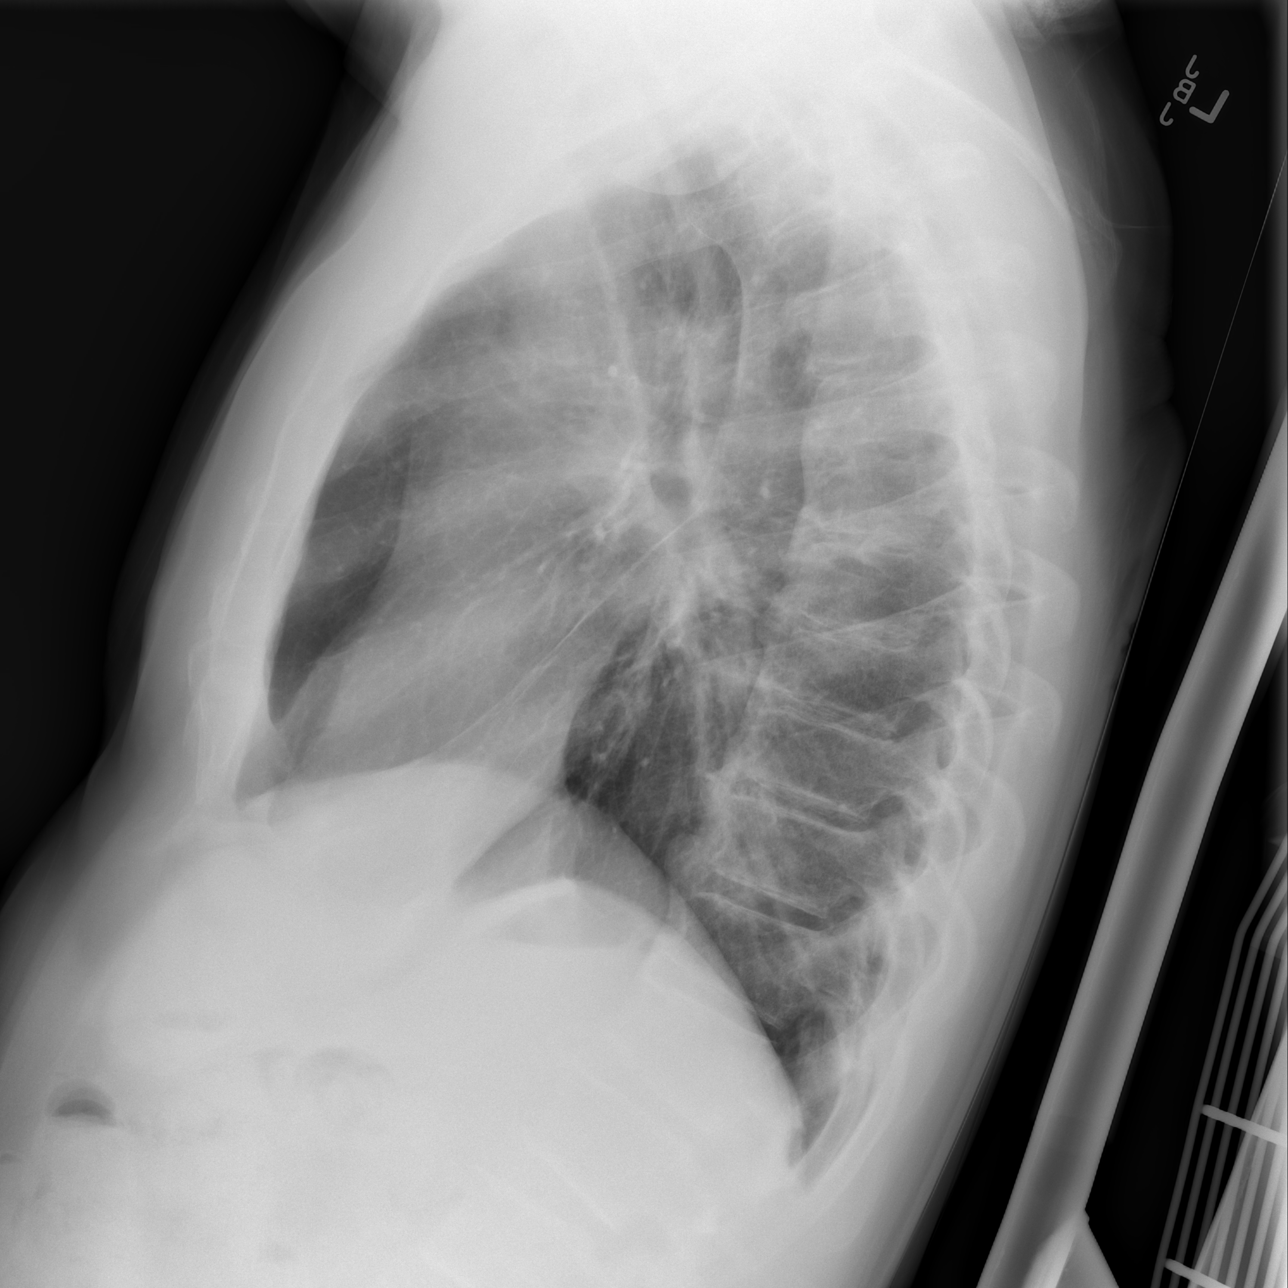

[3 of 3 positions shown; findings below may reference images not displayed]

FINDINGS: The lungs are adequately inflated. Subtle increased lung markings
are noted in the left lower lobe which is accentuated by overlapping
osteophyte in the lower thoracic spine. The right lung is clear. The
heart and pulmonary vascularity are normal. The mediastinum is
normal in width. There is multilevel degenerative disc disease of
the thoracic spine with calcification in the a jejunal longitudinal
ligament.
IMPRESSION: Hazy increased density in the left lower lobe may reflect
subsegmental atelectasis or early pneumonia. Followup PA and lateral
chest X-ray is recommended in 3-4 weeks following trial of
antibiotic therapy to ensure resolution.
